# Patient Record
Sex: Female | Born: 1940 | Race: White | Hispanic: No | Marital: Married | Smoking: Former smoker
Health system: Southern US, Community
[De-identification: ages and names within clinical notes are randomized; demographics above are authoritative.]

## PROBLEM LIST (undated history)

## (undated) DIAGNOSIS — C801 Malignant (primary) neoplasm, unspecified: Secondary | ICD-10-CM

## (undated) DIAGNOSIS — E785 Hyperlipidemia, unspecified: Secondary | ICD-10-CM

## (undated) DIAGNOSIS — I10 Essential (primary) hypertension: Secondary | ICD-10-CM

## (undated) HISTORY — PX: TONSILLECTOMY: SUR1361

## (undated) HISTORY — PX: ABDOMINAL HYSTERECTOMY: SHX81

## (undated) HISTORY — PX: HEMORROIDECTOMY: SUR656

## (undated) HISTORY — PX: COLONOSCOPY: SHX174

---

## 2000-02-23 ENCOUNTER — Other Ambulatory Visit: Admission: RE | Admit: 2000-02-23 | Discharge: 2000-02-23 | Payer: Self-pay | Admitting: Obstetrics and Gynecology

## 2000-02-27 HISTORY — PX: COLONOSCOPY W/ POLYPECTOMY: SHX1380

## 2000-12-27 ENCOUNTER — Other Ambulatory Visit: Admission: RE | Admit: 2000-12-27 | Discharge: 2000-12-27 | Payer: Self-pay | Admitting: Obstetrics and Gynecology

## 2004-04-16 ENCOUNTER — Other Ambulatory Visit: Payer: Self-pay

## 2004-04-17 ENCOUNTER — Observation Stay: Payer: Self-pay | Admitting: Internal Medicine

## 2005-10-20 ENCOUNTER — Ambulatory Visit: Payer: Self-pay | Admitting: Unknown Physician Specialty

## 2005-12-22 ENCOUNTER — Ambulatory Visit: Payer: Self-pay | Admitting: Unknown Physician Specialty

## 2011-11-23 ENCOUNTER — Ambulatory Visit: Payer: Self-pay | Admitting: Unknown Physician Specialty

## 2011-12-26 ENCOUNTER — Ambulatory Visit: Payer: Self-pay | Admitting: Anesthesiology

## 2012-01-03 ENCOUNTER — Ambulatory Visit: Payer: Self-pay | Admitting: Unknown Physician Specialty

## 2012-03-13 ENCOUNTER — Ambulatory Visit: Payer: Self-pay | Admitting: Family Medicine

## 2013-03-06 ENCOUNTER — Ambulatory Visit: Payer: Self-pay | Admitting: Family Medicine

## 2013-04-18 ENCOUNTER — Emergency Department: Payer: Self-pay | Admitting: Internal Medicine

## 2013-07-01 ENCOUNTER — Ambulatory Visit: Payer: Self-pay | Admitting: Hematology and Oncology

## 2013-10-10 ENCOUNTER — Ambulatory Visit: Payer: Self-pay | Admitting: Hematology and Oncology

## 2013-10-27 ENCOUNTER — Ambulatory Visit: Payer: Self-pay | Admitting: Hematology and Oncology

## 2014-03-17 ENCOUNTER — Ambulatory Visit: Payer: Self-pay | Admitting: Family Medicine

## 2014-09-15 NOTE — Op Note (Signed)
PATIENT NAME:  Shirley Nichols, STREET MR#:  [id number] DATE OF BIRTH:  1941/02/22  DATE OF PROCEDURE:  01/03/2012  PREOPERATIVE DIAGNOSIS: Torn medial meniscus, right knee.   POSTOPERATIVE DIAGNOSES:  1. Torn medial meniscus, right knee. 2. Medial femoral chondral lesion.   PROCEDURE: Arthroscopic partial medial meniscectomy plus debridement of the medial femoral chondral lesions.   SURGEON: Kathrene Alu., MD  ANESTHESIA: General.   HISTORY: The patient had a long history of right knee pain. It was initially precipitated by an injury. Plain films had revealed minimal narrowing of her medial compartment on the right. MRI was consistent with a torn medial meniscus. Patient was ultimately brought in for arthroscopy of her right knee.   DESCRIPTION OF PROCEDURE: Patient taken to the Operating Room where satisfactory general anesthesia was achieved. A tourniquet and legholder were applied to the patient's right lower thigh and the left lower extremity was supported with a well legholder. The right knee was prepped and draped in the usual fashion for an arthroscopic procedure. Inflow cannula was introduced superomedially. The joint was distended with lactated Ringer's. We used the Mitek fluid pump to facilitate joint distention.   The scope was introduced through an inferolateral puncture wound and a probe through an inferomedial puncture wound. Inspection of the medial meniscus revealed that there was a large radial tear in the posterior horn of the medial meniscus near its root attachment. I went ahead and resected the tear using a combination of basket biters and a motorized resector. The rim was contoured with an angled ArthroCare thermal wand. Patient had grade 2, almost grade 3, changes in the weight-bearing portion of the medial femoral condyle. This area was debrided with a Turbowhisker.   Inspection of the intercondylar notch revealed that the cruciates were intact. Inspection of the  lateral compartment failed to reveal any significant pathology. There was a little fraying on the inner edge of her lateral meniscus which I debrided with a Turbowhisker.   The scope was introduced through the intercondylar notch into the posterior recess of the medial and lateral compartments. No additional pathology was noted. The trochlear groove was smooth. The retropatellar surface was visualized primarily from the superomedial portal. The patient had a little fraying of the superior portion of the patella. This was debrided with a Turbowhisker. Patellar tracking seemed to be normal.   The joint was decompressed at this time through the cannulas. The cannulas were removed. Puncture wounds were closed with 3-0 nylon in vertical mattress fashion. Several mL of 0.5% Marcaine with epinephrine were injected about each puncture wound. Betadine was applied to the wounds followed by a sterile dressing. An ice pack was applied to his right knee.   She was then awakened and transferred to a stretcher bed. She was taken to recovery room in satisfactory condition. Blood loss was negligible. Tourniquet, incidentally, was not inflated during the course of the procedure.   ____________________________ Kathrene Alu., MD hbk:cms D: 01/04/2012 09:17:32 ET T: 01/04/2012 09:57:53 ET JOB#: [id number]  cc: Kathrene Alu., MD, <Dictator>  Vilinda Flake, JR MD ELECTRONICALLY SIGNED 01/14/2012 14:50

## 2015-03-25 ENCOUNTER — Other Ambulatory Visit: Payer: Self-pay | Admitting: Family Medicine

## 2015-03-25 DIAGNOSIS — Z1231 Encounter for screening mammogram for malignant neoplasm of breast: Secondary | ICD-10-CM

## 2015-03-30 ENCOUNTER — Ambulatory Visit
Admission: RE | Admit: 2015-03-30 | Discharge: 2015-03-30 | Disposition: A | Discharge status: Home / Self Care | Payer: BLUE CROSS/BLUE SHIELD | Source: Ambulatory Visit | Attending: Family Medicine | Admitting: Family Medicine

## 2015-03-30 DIAGNOSIS — Z1231 Encounter for screening mammogram for malignant neoplasm of breast: Secondary | ICD-10-CM | POA: Diagnosis not present

## 2015-03-30 HISTORY — DX: Malignant (primary) neoplasm, unspecified: C80.1

## 2016-03-21 ENCOUNTER — Other Ambulatory Visit: Payer: Self-pay | Admitting: Family Medicine

## 2016-03-21 DIAGNOSIS — Z1231 Encounter for screening mammogram for malignant neoplasm of breast: Secondary | ICD-10-CM

## 2016-03-22 ENCOUNTER — Ambulatory Visit (INDEPENDENT_AMBULATORY_CARE_PROVIDER_SITE_OTHER): Payer: BLUE CROSS/BLUE SHIELD | Admitting: Podiatry

## 2016-03-22 ENCOUNTER — Encounter: Payer: Self-pay | Admitting: Podiatry

## 2016-03-22 ENCOUNTER — Other Ambulatory Visit: Payer: Self-pay | Admitting: *Deleted

## 2016-03-22 DIAGNOSIS — Q828 Other specified congenital malformations of skin: Secondary | ICD-10-CM | POA: Diagnosis not present

## 2016-03-22 NOTE — Progress Notes (Signed)
   Subjective:    Patient ID: Shirley Nichols, female    DOB: 29-Jun-1940, 75 y.o.   MRN: [id number]  HPI: She presents today with a chief complaint of pain to the plantar aspect of the right foot. States his been present for many years she takes that it and she just states is getting somewhat worse that she can hardly walk on it.    Review of Systems  All other systems reviewed and are negative.      Objective:   Physical Exam: Vital signs are stable she is alert and oriented 3. Pulses are palpable. Neurologic sensorium is intact. Deep tendon reflexes are intact and muscle strength is symmetrical and equal bilateral. Orthopedic evaluation strips all joints distal to the ankle range of motion without crepitus mild HAV deformity hammertoe deformities are noted bilateral. Cutaneous evaluation demonstrates reactive hyperkeratotic lesion plantar aspect sub-fourth metatarsal head of the right foot. This is not wart. This appears to be a porokeratosis..     Assessment & Plan:  Porokeratosis plantar aspect right foot.  Plan: Debridement and reactive hyperkeratotic tissue manual manually and debrided with chemical. To be left on under under occlusion until tomorrow morning that she will wash it off thoroughly. Follow up with her in a few weeks just to make sure that she is healing well.

## 2016-04-06 ENCOUNTER — Ambulatory Visit
Admission: RE | Admit: 2016-04-06 | Discharge: 2016-04-06 | Disposition: A | Discharge status: Home / Self Care | Payer: BLUE CROSS/BLUE SHIELD | Source: Ambulatory Visit | Attending: Family Medicine | Admitting: Family Medicine

## 2016-04-06 DIAGNOSIS — Z1231 Encounter for screening mammogram for malignant neoplasm of breast: Secondary | ICD-10-CM | POA: Diagnosis present

## 2016-04-17 ENCOUNTER — Ambulatory Visit (INDEPENDENT_AMBULATORY_CARE_PROVIDER_SITE_OTHER): Payer: BLUE CROSS/BLUE SHIELD | Admitting: Podiatry

## 2016-04-17 ENCOUNTER — Encounter: Payer: Self-pay | Admitting: Podiatry

## 2016-04-17 DIAGNOSIS — Q828 Other specified congenital malformations of skin: Secondary | ICD-10-CM

## 2016-04-17 NOTE — Progress Notes (Signed)
She presents today for follow-up of her porokeratosis to the plantar aspect of the fourth metatarsal of the right foot. She states that really never did blister but he feels better than it did prior to her last visit.  Objective: Vital signs are stable she is alert and oriented 3 pulses are palpable. She has a reactive hyperkeratotic with a nucleated core beneath the fourth metatarsal phalangeal joint. There is no signs of infection noted. Once no malodor no erythema. Positive pain on palpation.  Assessment: Porokeratosis right foot.  Plan: Mechanical and chemical debridement today with Cantharone and I will follow-up with her in 4-6 weeks. She understands that she will need wash this off thoroughly tomorrow morning and that this may blister. If he does blister she may last blister and allow for drainage and then utilize a Band-Aid. She is not to do Ruth blister.

## 2016-04-26 ENCOUNTER — Ambulatory Visit: Payer: BLUE CROSS/BLUE SHIELD | Admitting: Podiatry

## 2016-06-12 ENCOUNTER — Ambulatory Visit: Payer: BLUE CROSS/BLUE SHIELD | Admitting: Podiatry

## 2016-11-02 ENCOUNTER — Encounter: Payer: Self-pay | Admitting: *Deleted

## 2016-11-03 ENCOUNTER — Ambulatory Visit: Payer: BLUE CROSS/BLUE SHIELD | Admitting: Certified Registered"

## 2016-11-03 ENCOUNTER — Ambulatory Visit
Admission: RE | Admit: 2016-11-03 | Discharge: 2016-11-03 | Disposition: A | Discharge status: Home / Self Care | Payer: BLUE CROSS/BLUE SHIELD | Source: Ambulatory Visit | Attending: Unknown Physician Specialty | Admitting: Unknown Physician Specialty

## 2016-11-03 ENCOUNTER — Encounter
Admission: RE | Disposition: A | Discharge status: Home / Self Care | Payer: Self-pay | Source: Ambulatory Visit | Attending: Unknown Physician Specialty

## 2016-11-03 ENCOUNTER — Encounter: Payer: Self-pay | Admitting: *Deleted

## 2016-11-03 DIAGNOSIS — J449 Chronic obstructive pulmonary disease, unspecified: Secondary | ICD-10-CM | POA: Diagnosis not present

## 2016-11-03 DIAGNOSIS — Z79899 Other long term (current) drug therapy: Secondary | ICD-10-CM | POA: Insufficient documentation

## 2016-11-03 DIAGNOSIS — Z86010 Personal history of colonic polyps: Secondary | ICD-10-CM | POA: Diagnosis not present

## 2016-11-03 DIAGNOSIS — Z87891 Personal history of nicotine dependence: Secondary | ICD-10-CM | POA: Insufficient documentation

## 2016-11-03 DIAGNOSIS — E785 Hyperlipidemia, unspecified: Secondary | ICD-10-CM | POA: Insufficient documentation

## 2016-11-03 DIAGNOSIS — K640 First degree hemorrhoids: Secondary | ICD-10-CM | POA: Insufficient documentation

## 2016-11-03 DIAGNOSIS — Z1211 Encounter for screening for malignant neoplasm of colon: Secondary | ICD-10-CM | POA: Insufficient documentation

## 2016-11-03 DIAGNOSIS — Z85828 Personal history of other malignant neoplasm of skin: Secondary | ICD-10-CM | POA: Insufficient documentation

## 2016-11-03 DIAGNOSIS — I10 Essential (primary) hypertension: Secondary | ICD-10-CM | POA: Diagnosis not present

## 2016-11-03 DIAGNOSIS — Z7982 Long term (current) use of aspirin: Secondary | ICD-10-CM | POA: Insufficient documentation

## 2016-11-03 DIAGNOSIS — Z7951 Long term (current) use of inhaled steroids: Secondary | ICD-10-CM | POA: Insufficient documentation

## 2016-11-03 HISTORY — DX: Essential (primary) hypertension: I10

## 2016-11-03 HISTORY — DX: Hyperlipidemia, unspecified: E78.5

## 2016-11-03 HISTORY — PX: COLONOSCOPY WITH PROPOFOL: SHX5780

## 2016-11-03 SURGERY — COLONOSCOPY WITH PROPOFOL
Anesthesia: General

## 2016-11-03 MED ORDER — SODIUM CHLORIDE 0.9 % IV SOLN
INTRAVENOUS | Status: DC
  Administered 2016-11-03: 09:00:00 via INTRAVENOUS

## 2016-11-03 MED ORDER — PROPOFOL 500 MG/50ML IV EMUL
INTRAVENOUS | Status: AC
  Filled 2016-11-03: qty 50

## 2016-11-03 MED ORDER — LIDOCAINE 2% (20 MG/ML) 5 ML SYRINGE
INTRAMUSCULAR | Status: DC | PRN
  Administered 2016-11-03: 25 mg via INTRAVENOUS

## 2016-11-03 MED ORDER — SODIUM CHLORIDE 0.9 % IV SOLN: INTRAVENOUS | Status: DC

## 2016-11-03 MED ORDER — PROPOFOL 500 MG/50ML IV EMUL
INTRAVENOUS | Status: DC | PRN
  Administered 2016-11-03: 120 ug/kg/min via INTRAVENOUS

## 2016-11-03 MED ORDER — PROPOFOL 10 MG/ML IV BOLUS
INTRAVENOUS | Status: DC | PRN
  Administered 2016-11-03: 50 mg via INTRAVENOUS
  Administered 2016-11-03 (×2): 20 mg via INTRAVENOUS

## 2016-11-03 NOTE — Anesthesia Post-op Follow-up Note (Cosign Needed)
Anesthesia QCDR form completed.        

## 2016-11-03 NOTE — Transfer of Care (Signed)
Immediate Anesthesia Transfer of Care Note  Patient: Shirley Nichols  Procedure(s) Performed: Procedure(s): COLONOSCOPY WITH PROPOFOL (N/A)  Patient Location: Endoscopy Unit  Anesthesia Type:General  Level of Consciousness: awake  Airway & Oxygen Therapy: Patient Spontanous Breathing and Patient connected to nasal cannula oxygen  Post-op Assessment: Report given to RN and Post -op Vital signs reviewed and stable  Post vital signs: Reviewed  Last Vitals:  Vitals:   11/03/16 0838 11/03/16 1009  BP: 120/77 112/63  Pulse: 77 69  Resp: 14 15  Temp: 36.1 C     Last Pain:  Vitals:   11/03/16 0838  TempSrc: Tympanic         Complications: No apparent anesthesia complications

## 2016-11-03 NOTE — Op Note (Signed)
[facility] Patient Name: Shirley Nichols Procedure Date: 11/03/2016 9:38 AM MRN: [id number] Account #: [id number] Date of Birth: March 25, 1941 Admit Type: Outpatient Age: 76 Room: [facility] ENDO ROOM 1 Gender: Female Note Status: Finalized Procedure:            Colonoscopy Indications:          High risk colon cancer surveillance: Personal history                        of colonic polyps Providers:            Manya Silvas, MD Referring MD:         Jordan Likes. Lavena Bullion (Referring MD) Medicines:            Propofol per Anesthesia Complications:        No immediate complications. Procedure:            Pre-Anesthesia Assessment:                       - After reviewing the risks and benefits, the patient                        was deemed in satisfactory condition to undergo the                        procedure.                       After obtaining informed consent, the colonoscope was                        passed under direct vision. Throughout the procedure,                        the patient's blood pressure, pulse, and oxygen                        saturations were monitored continuously. The                        Colonoscope was introduced through the anus and                        advanced to the the cecum, identified by appendiceal                        orifice and ileocecal valve. The colonoscopy was                        somewhat difficult due to significant looping and a                        tortuous colon. Successful completion of the procedure                        was aided by applying abdominal pressure. The patient                        tolerated the procedure well. The quality of the bowel  preparation was excellent. Findings:      Internal hemorrhoids were found during endoscopy. The hemorrhoids were       small and Grade I (internal hemorrhoids that do not prolapse). Small       anal papillie      The exam  was otherwise without abnormality. Impression:           - Internal hemorrhoids.                       - The examination was otherwise normal.                       - No specimens collected. Recommendation:       - The findings and recommendations were discussed with                        the patient's family. No need to repeat exam due to age                        and 2 normal colonoscopies in 5 years. Manya Silvas, MD 11/03/2016 10:11:44 AM This report has been signed electronically. Number of Addenda: 0 Note Initiated On: 11/03/2016 9:38 AM Scope Withdrawal Time: 0 hours 8 minutes 30 seconds  Total Procedure Duration: 0 hours 24 minutes 42 seconds       [facility]

## 2016-11-03 NOTE — Anesthesia Postprocedure Evaluation (Signed)
Anesthesia Post Note  Patient: [name]  Procedure(s) Performed: Procedure(s) (LRB): COLONOSCOPY WITH PROPOFOL (N/A)  Patient location during evaluation: PACU Anesthesia Type: General Level of consciousness: awake Pain management: satisfactory to patient Vital Signs Assessment: post-procedure vital signs reviewed and stable Respiratory status: nonlabored ventilation Cardiovascular status: stable Anesthetic complications: no     Last Vitals:  Vitals:   11/03/16 1009 11/03/16 1010  BP: 112/63 112/63  Pulse: 69 69  Resp: 15 14  Temp:  (!) 35.6 C    Last Pain:  Vitals:   11/03/16 1010  TempSrc: Tympanic                 VAN STAVEREN,Marlaine Arey

## 2016-11-03 NOTE — Anesthesia Preprocedure Evaluation (Signed)
Anesthesia Evaluation  Patient identified by MRN, date of birth, ID band Patient awake    Reviewed: Allergy & Precautions, NPO status , Patient's Chart, lab work & pertinent test results  Airway Mallampati: II       Dental  (+) Teeth Intact   Pulmonary COPD, former smoker,     + decreased breath sounds      Cardiovascular Exercise Tolerance: Good hypertension, Pt. on medications  Rhythm:Regular     Neuro/Psych negative neurological ROS     GI/Hepatic negative GI ROS, Neg liver ROS,   Endo/Other  negative endocrine ROS  Renal/GU negative Renal ROS     Musculoskeletal   Abdominal Normal abdominal exam  (+)   Peds negative pediatric ROS (+)  Hematology   Anesthesia Other Findings   Reproductive/Obstetrics                             Anesthesia Physical Anesthesia Plan  ASA: II  Anesthesia Plan: General   Post-op Pain Management:    Induction: Intravenous  PONV Risk Score and Plan: 1 and Ondansetron and Treatment may vary due to age  Airway Management Planned: Natural Airway  Additional Equipment:   Intra-op Plan:   Post-operative Plan:   Informed Consent: I have reviewed the patients History and Physical, chart, labs and discussed the procedure including the risks, benefits and alternatives for the proposed anesthesia with the patient or authorized representative who has indicated his/her understanding and acceptance.     Plan Discussed with: CRNA  Anesthesia Plan Comments:         Anesthesia Quick Evaluation

## 2016-11-03 NOTE — H&P (Signed)
   Primary Care Physician:  Remi Haggard, FNP Primary Gastroenterologist:  Dr. Vira Agar  Pre-Procedure History & Physical: HPI:  Shirley Nichols is a 76 y.o. female is here for an colonoscopy.   Past Medical History:  Diagnosis Date  . Cancer ([place])    skin cancer  . Hyperlipidemia   . Hypertension     Past Surgical History:  Procedure Laterality Date  . ABDOMINAL HYSTERECTOMY    . COLONOSCOPY     02/21/2002 adenomatous polyp, 03/23/2011  . HEMORROIDECTOMY    . TONSILLECTOMY      Prior to Admission medications   Medication Sig Start Date End Date Taking? Authorizing Provider  aspirin EC 81 MG tablet Take 81 mg by mouth daily.   Yes [provider]  b complex vitamins capsule Take 1 capsule by mouth daily.   Yes [provider]  Calcium Carbonate-Vit D-Min (CALCIUM 1200 PO) Take by mouth.   Yes [provider]  Cholecalciferol (VITAMIN D3 PO) Take by mouth.   Yes [provider]  Cyanocobalamin (B-12 PO) Take by mouth.   Yes [provider]  fluvastatin XL (LESCOL XL) 80 MG 24 hr tablet  02/19/16  Yes [provider]  hydrocortisone (PROCTO-MED HC) 2.5 % rectal cream Place 1 application rectally 2 (two) times daily.   Yes [provider]  valsartan-hydrochlorothiazide (DIOVAN-HCT) 320-25 MG tablet  01/28/16  Yes [provider]  SYMBICORT 160-4.5 MCG/ACT inhaler  02/05/16   [provider]    Allergies as of 08/10/2016  . (No Known Allergies)    Family History  Problem Relation Age of Onset  . Breast cancer Neg Hx     Social History   Social History  . Marital status: Married    Spouse name: N/A  . Number of children: N/A  . Years of education: N/A   Occupational History  . Not on file.   Social History Main Topics  . Smoking status: Former Research scientist (life sciences)  . Smokeless tobacco: Never Used  . Alcohol use No  . Drug use: No  . Sexual activity: Yes   Other Topics Concern  . Not on file    Social History Narrative  . No narrative on file    Review of Systems: See HPI, otherwise negative ROS  Physical Exam: BP 120/77   Pulse 77   Temp 97 F (36.1 C) (Tympanic)   Resp 14   Ht 5\' 4"  (1.626 m)   Wt 65.8 kg (145 lb)   SpO2 100%   BMI 24.89 kg/m  General:   Alert,  pleasant and cooperative in NAD Head:  Normocephalic and atraumatic. Neck:  Supple; no masses or thyromegaly. Lungs:  Clear throughout to auscultation.    Heart:  Regular rate and rhythm. Abdomen:  Soft, nontender and nondistended. Normal bowel sounds, without guarding, and without rebound.   Neurologic:  Alert and  oriented x4;  grossly normal neurologically.  Impression/Plan: Shirley Nichols is here for an colonoscopy to be performed for Personal history of colon polyps.  Risks, benefits, limitations, and alternatives regarding  colonoscopy have been reviewed with the patient.  Questions have been answered.  All parties agreeable.   Gaylyn Cheers, MD  11/03/2016, 9:33 AM

## 2016-11-06 ENCOUNTER — Encounter: Payer: Self-pay | Admitting: Unknown Physician Specialty

## 2016-11-22 ENCOUNTER — Ambulatory Visit
Admission: RE | Admit: 2016-11-22 | Discharge: 2016-11-22 | Disposition: A | Discharge status: Home / Self Care | Payer: BLUE CROSS/BLUE SHIELD | Source: Ambulatory Visit | Attending: Unknown Physician Specialty | Admitting: Unknown Physician Specialty

## 2016-11-22 ENCOUNTER — Other Ambulatory Visit: Payer: Self-pay | Admitting: Unknown Physician Specialty

## 2016-11-22 DIAGNOSIS — R51 Headache: Principal | ICD-10-CM

## 2016-11-22 DIAGNOSIS — R519 Headache, unspecified: Secondary | ICD-10-CM

## 2016-11-24 ENCOUNTER — Ambulatory Visit (INDEPENDENT_AMBULATORY_CARE_PROVIDER_SITE_OTHER): Payer: BLUE CROSS/BLUE SHIELD | Admitting: Podiatry

## 2016-11-24 ENCOUNTER — Encounter: Payer: Self-pay | Admitting: Podiatry

## 2016-11-24 DIAGNOSIS — Q828 Other specified congenital malformations of skin: Secondary | ICD-10-CM | POA: Diagnosis not present

## 2016-11-24 NOTE — Progress Notes (Signed)
   Subjective: Patient presents to the office today for chief complaint of painful callus lesions of the feet. Patient states that the pain is ongoing and is affecting their ability to ambulate without pain. Patient presents today for further treatment and evaluation.  Objective:  Physical Exam General: Alert and oriented x3 in no acute distress  Dermatology: Hyperkeratotic lesion present on the plantar forefoot bilateral feet 4. Pain on palpation with a central nucleated core noted.  Skin is warm, dry and supple bilateral lower extremities. Negative for open lesions or macerations.  Vascular: Palpable pedal pulses bilaterally. No edema or erythema noted. Capillary refill within normal limits.  Neurological: Epicritic and protective threshold grossly intact bilaterally.   Musculoskeletal Exam: Pain on palpation at the keratotic lesion noted. Range of motion within normal limits bilateral. Muscle strength 5/5 in all groups bilateral.  Assessment: #1 porokeratosis 4 bilateral feet   Plan of Care:  #1 Patient evaluated #2 Excisional debridement of keratoic lesion using a chisel blade was performed without incident.  #3 Treated area(s) with Salinocaine and dressed with light dressing. #4 Patient is to return to the clinic PRN.   Edrick Kins, DPM [facility]  Dr. Edrick Kins, [address]                                        [place] [place], [place] [address]                Office [phone]  Fax [phone]

## 2017-03-29 ENCOUNTER — Other Ambulatory Visit: Payer: Self-pay | Admitting: Family Medicine

## 2017-03-29 DIAGNOSIS — Z1231 Encounter for screening mammogram for malignant neoplasm of breast: Secondary | ICD-10-CM

## 2017-04-11 ENCOUNTER — Ambulatory Visit
Admission: RE | Admit: 2017-04-11 | Discharge: 2017-04-11 | Disposition: A | Discharge status: Home / Self Care | Payer: BLUE CROSS/BLUE SHIELD | Source: Ambulatory Visit | Attending: Family Medicine | Admitting: Family Medicine

## 2017-04-11 DIAGNOSIS — Z1231 Encounter for screening mammogram for malignant neoplasm of breast: Secondary | ICD-10-CM | POA: Diagnosis present

## 2017-05-07 ENCOUNTER — Ambulatory Visit: Payer: BLUE CROSS/BLUE SHIELD | Admitting: Podiatry

## 2017-05-14 ENCOUNTER — Encounter: Payer: Self-pay | Admitting: Podiatry

## 2017-05-14 ENCOUNTER — Ambulatory Visit (INDEPENDENT_AMBULATORY_CARE_PROVIDER_SITE_OTHER): Payer: BLUE CROSS/BLUE SHIELD | Admitting: Podiatry

## 2017-05-14 DIAGNOSIS — Q828 Other specified congenital malformations of skin: Secondary | ICD-10-CM | POA: Diagnosis not present

## 2017-05-14 NOTE — Progress Notes (Signed)
She presents today chief complaint of painful lesion to the plantar aspect of her right foot.  Objective: Vital signs are stable she is alert and oriented x3.  Pulses are palpable.  She presents today with a reactive hyperkeratotic lesion beneath the base of the fourth digit of the right foot.  This is exquisitely tender on direct palpation and medial lateral compression.  There is no signs of purulence malodor or any signs of infection.  Assessment: Porokeratosis.  Plan: Debridement of all reactive hyperkeratosis.  I placed salicylic acid under occlusion to be left there for 3 days.  She was given both oral and written home-going instruction for the care use of this.  Follow-up with me in 1 month or as needed.

## 2017-10-17 ENCOUNTER — Ambulatory Visit (INDEPENDENT_AMBULATORY_CARE_PROVIDER_SITE_OTHER): Payer: BLUE CROSS/BLUE SHIELD | Admitting: Podiatry

## 2017-10-17 ENCOUNTER — Encounter: Payer: Self-pay | Admitting: Podiatry

## 2017-10-17 DIAGNOSIS — Q828 Other specified congenital malformations of skin: Secondary | ICD-10-CM

## 2017-10-17 NOTE — Progress Notes (Signed)
She presents today for follow-up of painful poor keratoma to the plantar aspect of the right foot.  She states that think any of these removed once again.  Objective: Vital signs are stable alert and oriented x3.  Pulses are palpable.  Neurologic sensorium is intact.  Porokeratosis present to the plantar aspect beneath the third and fourth digits.  No open lesions or wounds.  Assessment: Porokeratosis.  Plan: Debridement of all reactive hyperkeratoses deeply today.  Salicylic acid was applied under occlusion to be left on for 3 days then washed off thoroughly I will follow-up with her in several weeks.

## 2018-01-02 DIAGNOSIS — M79644 Pain in right finger(s): Secondary | ICD-10-CM | POA: Insufficient documentation

## 2018-01-02 DIAGNOSIS — M1811 Unilateral primary osteoarthritis of first carpometacarpal joint, right hand: Secondary | ICD-10-CM | POA: Insufficient documentation

## 2018-04-10 ENCOUNTER — Other Ambulatory Visit: Payer: Self-pay | Admitting: Family Medicine

## 2018-04-10 DIAGNOSIS — Z1231 Encounter for screening mammogram for malignant neoplasm of breast: Secondary | ICD-10-CM

## 2018-04-18 ENCOUNTER — Ambulatory Visit
Admission: RE | Admit: 2018-04-18 | Discharge: 2018-04-18 | Disposition: A | Discharge status: Home / Self Care | Payer: BLUE CROSS/BLUE SHIELD | Source: Ambulatory Visit | Attending: Family Medicine | Admitting: Family Medicine

## 2018-04-18 DIAGNOSIS — Z1231 Encounter for screening mammogram for malignant neoplasm of breast: Secondary | ICD-10-CM | POA: Insufficient documentation

## 2019-03-13 ENCOUNTER — Other Ambulatory Visit: Payer: Self-pay | Admitting: Family Medicine

## 2019-03-13 DIAGNOSIS — Z1231 Encounter for screening mammogram for malignant neoplasm of breast: Secondary | ICD-10-CM

## 2019-04-23 ENCOUNTER — Ambulatory Visit
Admission: RE | Admit: 2019-04-23 | Discharge: 2019-04-23 | Disposition: A | Discharge status: Home / Self Care | Payer: Medicare Other | Source: Ambulatory Visit | Attending: Family Medicine | Admitting: Family Medicine

## 2019-04-23 DIAGNOSIS — Z1231 Encounter for screening mammogram for malignant neoplasm of breast: Secondary | ICD-10-CM | POA: Diagnosis present

## 2020-02-19 ENCOUNTER — Other Ambulatory Visit: Payer: Self-pay | Admitting: Family Medicine

## 2020-02-19 DIAGNOSIS — Z1231 Encounter for screening mammogram for malignant neoplasm of breast: Secondary | ICD-10-CM

## 2020-04-14 ENCOUNTER — Ambulatory Visit: Payer: BLUE CROSS/BLUE SHIELD | Admitting: Podiatry

## 2020-04-14 ENCOUNTER — Encounter: Payer: Self-pay | Admitting: Podiatry

## 2020-04-14 ENCOUNTER — Ambulatory Visit (INDEPENDENT_AMBULATORY_CARE_PROVIDER_SITE_OTHER): Payer: Medicare Other | Admitting: Podiatry

## 2020-04-14 ENCOUNTER — Other Ambulatory Visit: Payer: Self-pay

## 2020-04-14 DIAGNOSIS — L989 Disorder of the skin and subcutaneous tissue, unspecified: Secondary | ICD-10-CM

## 2020-04-14 DIAGNOSIS — Q828 Other specified congenital malformations of skin: Secondary | ICD-10-CM | POA: Diagnosis not present

## 2020-04-14 NOTE — Patient Instructions (Signed)
Look for urea 40% cream or ointment and apply to the thickened dry skin / calluses. This can be bought over the counter, at a pharmacy or online such as [employer].  

## 2020-04-14 NOTE — Progress Notes (Signed)
°  Subjective:  Patient ID: Shirley Nichols, female    DOB: 1940/09/24,  MRN: [id number]  Chief Complaint  Patient presents with   Callouses    Painful callouses bottom of right forefoot x 1-2 weeks    79 y.o. female presents with the above complaint. History confirmed with patient.  Previously treated by Dr. Milinda Pointer with debridement and application of salicylic acid, this was helpful  Objective:  Physical Exam: warm, good capillary refill, no trophic changes or ulcerative lesions, normal DP and PT pulses and normal sensory exam.  Right Foot: Porokeratosis submet 3-4, hammertoes present   Assessment:   1. Porokeratosis   2. Benign skin lesion      Plan:  Patient was evaluated and treated and all questions answered.  Discussed with her the etiology and treatment options for porokeratosis.  I recommended debridement and application of salicylic acid.  A chisel blade was used to debride the lesion and enucleate the core, salicylic acid salinocaine ointment was applied with an occlusive bandage and a pressure dressing.  She tolerated procedure well  Return if symptoms worsen or fail to improve, for calluses.

## 2020-04-26 ENCOUNTER — Ambulatory Visit
Admission: RE | Admit: 2020-04-26 | Discharge: 2020-04-26 | Disposition: A | Discharge status: Home / Self Care | Payer: Medicare Other | Source: Ambulatory Visit | Attending: Family Medicine | Admitting: Family Medicine

## 2020-04-26 ENCOUNTER — Other Ambulatory Visit: Payer: Self-pay

## 2020-04-26 DIAGNOSIS — Z1231 Encounter for screening mammogram for malignant neoplasm of breast: Secondary | ICD-10-CM | POA: Diagnosis present

## 2020-09-06 ENCOUNTER — Ambulatory Visit (INDEPENDENT_AMBULATORY_CARE_PROVIDER_SITE_OTHER): Payer: Medicare Other | Admitting: Podiatry

## 2020-09-06 ENCOUNTER — Other Ambulatory Visit: Payer: Self-pay

## 2020-09-06 ENCOUNTER — Encounter: Payer: Self-pay | Admitting: Podiatry

## 2020-09-06 DIAGNOSIS — D2371 Other benign neoplasm of skin of right lower limb, including hip: Secondary | ICD-10-CM | POA: Diagnosis not present

## 2020-09-06 NOTE — Progress Notes (Signed)
She presents today chief chief complaint of painful callus plantar aspect of the right foot.  Objective: Calluses are thick to the right foot subthird and subfourth metatarsal heads of the right foot.  They appear to be very deep lesions.  They are painful on palpation as well as debridement.  Assessment: Benign skin lesions.  Plan: Chemical and chemical debridement of benign skin lesion with salicylic acid under occlusion to be left on for 3 days before washing off thoroughly I will follow-up with her on an as-needed basis.

## 2021-01-10 ENCOUNTER — Ambulatory Visit: Payer: Medicare Other | Admitting: Podiatry

## 2021-01-26 ENCOUNTER — Encounter: Payer: Self-pay | Admitting: Podiatry

## 2021-01-26 ENCOUNTER — Ambulatory Visit (INDEPENDENT_AMBULATORY_CARE_PROVIDER_SITE_OTHER): Payer: Medicare Other | Admitting: Podiatry

## 2021-01-26 ENCOUNTER — Other Ambulatory Visit: Payer: Self-pay

## 2021-01-26 DIAGNOSIS — D2371 Other benign neoplasm of skin of right lower limb, including hip: Secondary | ICD-10-CM

## 2021-01-26 NOTE — Progress Notes (Signed)
She presents today after having not seen her since April with a chief complaint of painful lesion plantar aspect of the right foot.  Objective: Vital signs are stable alert and oriented x3.  Pulses are palpable she has a painful lesion plantar aspect submetatarsal fourth right.  Assessment: Painful benign skin lesion.  Plan: Mechanical and chemical debridement of painful benign skin lesion plantar aspect right foot.  This will be left on under occlusion for 3 days and then washed off thoroughly.

## 2021-02-21 ENCOUNTER — Other Ambulatory Visit: Payer: Self-pay | Admitting: Family Medicine

## 2021-02-21 DIAGNOSIS — Z1231 Encounter for screening mammogram for malignant neoplasm of breast: Secondary | ICD-10-CM

## 2021-04-19 ENCOUNTER — Other Ambulatory Visit: Payer: Self-pay

## 2021-04-19 ENCOUNTER — Ambulatory Visit (INDEPENDENT_AMBULATORY_CARE_PROVIDER_SITE_OTHER): Payer: Medicare Other | Admitting: Podiatry

## 2021-04-19 ENCOUNTER — Encounter: Payer: Self-pay | Admitting: Podiatry

## 2021-04-19 DIAGNOSIS — M10072 Idiopathic gout, left ankle and foot: Secondary | ICD-10-CM | POA: Diagnosis not present

## 2021-04-19 DIAGNOSIS — M7752 Other enthesopathy of left foot: Secondary | ICD-10-CM

## 2021-04-19 MED ORDER — COLCHICINE 0.6 MG PO TABS: 0.6000 mg | ORAL_TABLET | Freq: Every day | ORAL | 0 refills | Status: DC

## 2021-04-19 MED ORDER — METHYLPREDNISOLONE 4 MG PO TBPK: ORAL_TABLET | ORAL | 0 refills | Status: DC

## 2021-04-19 NOTE — Progress Notes (Signed)
Subjective:  Patient ID: Shirley Nichols, female    DOB: [birth year],  MRN: [id number]  Chief Complaint  Patient presents with   Toe Pain    Left hallux toe pain  PT stated she went to urgent care and they gave her meds and still not getting any better     80 y.o. female presents with the above complaint.  Patient presents with left first metatarsophalangeal joint pain.  Patient states is painful to touch.  She states that it is painful with ambulation and came out of nowhere.  She does not have any history of gout.  Is red hot swollen joint.  Is been on for past 5 days.  She has a intake and fish.  She does not have a high intake of red meat or alcohol.  She has not seen MRIs prior to seeing me.  She went to go see her primary care physician who put her on antibiotics and gave her mupirocin ointment.  She does not have any ingrown pain.   Review of Systems: Negative except as noted in the HPI. Denies N/V/F/Ch.  Past Medical History:  Diagnosis Date   Cancer ([place])    skin cancer   Hyperlipidemia    Hypertension     Current Outpatient Medications:    colchicine 0.6 MG tablet, Take 1 tablet (0.6 mg total) by mouth daily., Disp: 30 tablet, Rfl: 0   methylPREDNISolone (MEDROL DOSEPAK) 4 MG TBPK tablet, Take as directed, Disp: 21 each, Rfl: 0   aspirin EC 81 MG tablet, Take 81 mg by mouth daily., Disp: , Rfl:    b complex vitamins capsule, Take 1 capsule by mouth daily., Disp: , Rfl:    Calcium Carbonate-Vit D-Min (CALCIUM 1200 PO), Take by mouth., Disp: , Rfl:    Cholecalciferol (VITAMIN D3 PO), Take by mouth., Disp: , Rfl:    Cyanocobalamin (B-12 PO), Take by mouth., Disp: , Rfl:    fluvastatin XL (LESCOL XL) 80 MG 24 hr tablet, , Disp: , Rfl: 1   hydrocortisone (PROCTO-MED HC) 2.5 % rectal cream, Place 1 application rectally 2 (two) times daily., Disp: , Rfl:    SYMBICORT 160-4.5 MCG/ACT inhaler, , Disp: , Rfl: 4   valsartan-hydrochlorothiazide (DIOVAN-HCT) 320-25 MG tablet, , Disp: ,  Rfl: 4  Social History   Tobacco Use  Smoking Status Former  Smokeless Tobacco Never    No Known Allergies Objective:  There were no vitals filed for this visit. There is no height or weight on file to calculate BMI. Constitutional Well developed. Well nourished.  Vascular Dorsalis pedis pulses palpable bilaterally. Posterior tibial pulses palpable bilaterally. Capillary refill normal to all digits.  No cyanosis or clubbing noted. Pedal hair growth normal.  Neurologic Normal speech. Oriented to person, place, and time. Epicritic sensation to light touch grossly present bilaterally.  Dermatologic Nails well groomed and normal in appearance. No open wounds. No skin lesions.  Orthopedic: Pain on palpation left first metatarsophalangeal joint.  Red hot swollen joint noted.  Pain with range of motion of the MPJ joint.  No deep intra-articular first MPJ pain noted.   Radiographs: None Assessment:   1. Acute idiopathic gout involving toe of left foot   2. Capsulitis of metatarsophalangeal (MTP) joint of left foot    Plan:  Patient was evaluated and treated and all questions answered.  Left first MPJ capsulitis with underlying gout flare -I explained to the patient the etiology of gout flare and various treatment options were discussed.  At this time she clinically continues to have pain that seems to be about the same slightly better.  She states that she has not taken any kind of Medication in the past she does not have any history of gout. -I will issue benefit from colchicine and Medrol Dosepak to help with controlling the gout flare. -She will also benefit from a steroid injection.  She agrees with the plan like to proceed with steroid injection -A steroid injection was performed at left first MPJ using 1% plain Lidocaine and 10 mg of Kenalog. This was well tolerated. -If this continues to happen patient will benefit from further work-up including rheumatology follow-up.  She  states understanding   No follow-ups on file.

## 2021-04-19 NOTE — Progress Notes (Signed)
me

## 2021-04-27 ENCOUNTER — Other Ambulatory Visit: Payer: Self-pay

## 2021-04-27 ENCOUNTER — Ambulatory Visit
Admission: RE | Admit: 2021-04-27 | Discharge: 2021-04-27 | Disposition: A | Discharge status: Home / Self Care | Payer: Medicare Other | Source: Ambulatory Visit | Attending: Family Medicine | Admitting: Family Medicine

## 2021-04-27 DIAGNOSIS — Z1231 Encounter for screening mammogram for malignant neoplasm of breast: Secondary | ICD-10-CM | POA: Diagnosis not present

## 2021-04-28 ENCOUNTER — Telehealth: Payer: Self-pay | Admitting: *Deleted

## 2021-04-28 NOTE — Telephone Encounter (Signed)
"  This is Shirley Nichols at [employer].  I am calling in reference to [facility].  She's been taking Colchicine for a flare up of Gout.  That episode has passed.  She called and asked me yesterday if she's suppose to continue taking that as a preventative.  I told her I would ask Dr. Posey Pronto.  Please let me know and I will relay it to the patient.  Thank you so much."

## 2021-04-29 NOTE — Telephone Encounter (Signed)
I called and informed Arbie Cookey at [facility] about Dr. Serita Grit response which was that the Colchicine is to only be used for flare-ups.

## 2021-05-19 ENCOUNTER — Other Ambulatory Visit: Payer: Self-pay

## 2021-05-19 ENCOUNTER — Ambulatory Visit (INDEPENDENT_AMBULATORY_CARE_PROVIDER_SITE_OTHER): Payer: Medicare Other | Admitting: Podiatry

## 2021-05-19 DIAGNOSIS — M7752 Other enthesopathy of left foot: Secondary | ICD-10-CM

## 2021-05-19 DIAGNOSIS — M10072 Idiopathic gout, left ankle and foot: Secondary | ICD-10-CM | POA: Diagnosis not present

## 2021-05-19 NOTE — Progress Notes (Signed)
°  Subjective:  Patient ID: Shirley Nichols, female    DOB: [birth year],  MRN: [id number]  Chief Complaint  Patient presents with   Gout    Pt stated that her toe is doing a lot better     80 y.o. female presents with the above complaint.  Patient presents with left first metatarsophalangeal joint gout flare.  She states she is doing a lot better.  She states that the injection as well as medication has helped considerably.  She denies any other acute complaints.   Review of Systems: Negative except as noted in the HPI. Denies N/V/F/Ch.  Past Medical History:  Diagnosis Date   Cancer ([place])    skin cancer   Hyperlipidemia    Hypertension     Current Outpatient Medications:    aspirin EC 81 MG tablet, Take 81 mg by mouth daily., Disp: , Rfl:    b complex vitamins capsule, Take 1 capsule by mouth daily., Disp: , Rfl:    Calcium Carbonate-Vit D-Min (CALCIUM 1200 PO), Take by mouth., Disp: , Rfl:    Cholecalciferol (VITAMIN D3 PO), Take by mouth., Disp: , Rfl:    colchicine 0.6 MG tablet, Take 1 tablet (0.6 mg total) by mouth daily., Disp: 30 tablet, Rfl: 0   Cyanocobalamin (B-12 PO), Take by mouth., Disp: , Rfl:    fluvastatin XL (LESCOL XL) 80 MG 24 hr tablet, , Disp: , Rfl: 1   hydrocortisone (PROCTO-MED HC) 2.5 % rectal cream, Place 1 application rectally 2 (two) times daily., Disp: , Rfl:    methylPREDNISolone (MEDROL DOSEPAK) 4 MG TBPK tablet, Take as directed, Disp: 21 each, Rfl: 0   SYMBICORT 160-4.5 MCG/ACT inhaler, , Disp: , Rfl: 4   valsartan-hydrochlorothiazide (DIOVAN-HCT) 320-25 MG tablet, , Disp: , Rfl: 4  Social History   Tobacco Use  Smoking Status Former  Smokeless Tobacco Never    No Known Allergies Objective:  There were no vitals filed for this visit. There is no height or weight on file to calculate BMI. Constitutional Well developed. Well nourished.  Vascular Dorsalis pedis pulses palpable bilaterally. Posterior tibial pulses palpable  bilaterally. Capillary refill normal to all digits.  No cyanosis or clubbing noted. Pedal hair growth normal.  Neurologic Normal speech. Oriented to person, place, and time. Epicritic sensation to light touch grossly present bilaterally.  Dermatologic Nails well groomed and normal in appearance. No open wounds. No skin lesions.  Orthopedic: No further pain on palpation left first metatarsophalangeal joint.  No further red hot swollen joint noted.  No further pain with range of motion of the MPJ joint.  No deep intra-articular first MPJ pain noted.   Radiographs: None Assessment:   No diagnosis found.  Plan:  Patient was evaluated and treated and all questions answered.  Left first MPJ capsulitis with underlying gout flare -Clinically doing much better with oral medication as well as a steroid injection.  Her pain is nonexistent.  I once again discussed shoe gear modification as well as diet management.  If this continues to happen more often we will do a further work-up and long-term management of gout.  For now she would like to proceed with diet management. -If this continues to happen patient will benefit from further work-up including rheumatology follow-up.  She states understanding   No follow-ups on file.

## 2022-02-20 ENCOUNTER — Encounter: Payer: Self-pay | Admitting: Podiatry

## 2022-02-20 ENCOUNTER — Ambulatory Visit (INDEPENDENT_AMBULATORY_CARE_PROVIDER_SITE_OTHER): Payer: Medicare Other | Admitting: Podiatry

## 2022-02-20 DIAGNOSIS — M79676 Pain in unspecified toe(s): Secondary | ICD-10-CM | POA: Diagnosis not present

## 2022-02-20 DIAGNOSIS — D2371 Other benign neoplasm of skin of right lower limb, including hip: Secondary | ICD-10-CM | POA: Diagnosis not present

## 2022-02-20 DIAGNOSIS — B351 Tinea unguium: Secondary | ICD-10-CM | POA: Diagnosis not present

## 2022-02-20 DIAGNOSIS — D2372 Other benign neoplasm of skin of left lower limb, including hip: Secondary | ICD-10-CM

## 2022-02-20 NOTE — Progress Notes (Signed)
She presents today chief complaint of painful lesions to the plantar aspect of the bilateral foot right over left.  Objective: Vital signs are stable alert oriented x3 benign skin lesion Sub first metatarsophalangeal joint of the right foot and left foot.  Assessment benign skin lesions bilateral.  Plan: Mechanical and chemical debridement today with Salinocaine applied under occlusion to be left on for 3 days before being washed off thoroughly.  She will follow-up with me in 6 weeks

## 2022-03-03 ENCOUNTER — Emergency Department: Payer: PPO

## 2022-03-03 ENCOUNTER — Encounter (HOSPITAL_COMMUNITY): Payer: Self-pay | Admitting: Family Medicine

## 2022-03-03 ENCOUNTER — Emergency Department
Admission: EM | Admit: 2022-03-03 | Discharge: 2022-03-03 | DRG: 445 | Disposition: A | Discharge status: Other Acute Inpatient Hospital | Payer: PPO | Source: Ambulatory Visit | Attending: Internal Medicine | Admitting: Internal Medicine

## 2022-03-03 ENCOUNTER — Inpatient Hospital Stay (HOSPITAL_COMMUNITY)
Admission: AD | Admit: 2022-03-03 | Discharge: 2022-03-23 | DRG: 444 | Disposition: A | Discharge status: Rehabilitation Facility | Payer: PPO | Source: Other Acute Inpatient Hospital | Attending: Family Medicine | Admitting: Family Medicine

## 2022-03-03 ENCOUNTER — Encounter (HOSPITAL_COMMUNITY): Payer: Self-pay

## 2022-03-03 ENCOUNTER — Encounter: Payer: Self-pay | Admitting: Emergency Medicine

## 2022-03-03 ENCOUNTER — Other Ambulatory Visit: Payer: Self-pay

## 2022-03-03 ENCOUNTER — Inpatient Hospital Stay: Payer: PPO

## 2022-03-03 DIAGNOSIS — N1832 Chronic kidney disease, stage 3b: Secondary | ICD-10-CM | POA: Diagnosis present

## 2022-03-03 DIAGNOSIS — C259 Malignant neoplasm of pancreas, unspecified: Secondary | ICD-10-CM | POA: Diagnosis present

## 2022-03-03 DIAGNOSIS — R634 Abnormal weight loss: Secondary | ICD-10-CM | POA: Diagnosis present

## 2022-03-03 DIAGNOSIS — I10 Essential (primary) hypertension: Secondary | ICD-10-CM | POA: Diagnosis not present

## 2022-03-03 DIAGNOSIS — K831 Obstruction of bile duct: Principal | ICD-10-CM | POA: Diagnosis present

## 2022-03-03 DIAGNOSIS — E440 Moderate protein-calorie malnutrition: Secondary | ICD-10-CM | POA: Diagnosis present

## 2022-03-03 DIAGNOSIS — J690 Pneumonitis due to inhalation of food and vomit: Secondary | ICD-10-CM | POA: Diagnosis not present

## 2022-03-03 DIAGNOSIS — G40001 Localization-related (focal) (partial) idiopathic epilepsy and epileptic syndromes with seizures of localized onset, not intractable, with status epilepticus: Secondary | ICD-10-CM | POA: Diagnosis not present

## 2022-03-03 DIAGNOSIS — H539 Unspecified visual disturbance: Secondary | ICD-10-CM | POA: Diagnosis not present

## 2022-03-03 DIAGNOSIS — Z8507 Personal history of malignant neoplasm of pancreas: Secondary | ICD-10-CM

## 2022-03-03 DIAGNOSIS — K9189 Other postprocedural complications and disorders of digestive system: Secondary | ICD-10-CM

## 2022-03-03 DIAGNOSIS — Z7982 Long term (current) use of aspirin: Secondary | ICD-10-CM

## 2022-03-03 DIAGNOSIS — Z90710 Acquired absence of both cervix and uterus: Secondary | ICD-10-CM

## 2022-03-03 DIAGNOSIS — K859 Acute pancreatitis without necrosis or infection, unspecified: Secondary | ICD-10-CM | POA: Diagnosis not present

## 2022-03-03 DIAGNOSIS — E785 Hyperlipidemia, unspecified: Secondary | ICD-10-CM | POA: Diagnosis present

## 2022-03-03 DIAGNOSIS — Z87891 Personal history of nicotine dependence: Secondary | ICD-10-CM | POA: Diagnosis not present

## 2022-03-03 DIAGNOSIS — I959 Hypotension, unspecified: Secondary | ICD-10-CM | POA: Diagnosis not present

## 2022-03-03 DIAGNOSIS — K8689 Other specified diseases of pancreas: Secondary | ICD-10-CM | POA: Diagnosis not present

## 2022-03-03 DIAGNOSIS — Z515 Encounter for palliative care: Secondary | ICD-10-CM

## 2022-03-03 DIAGNOSIS — I129 Hypertensive chronic kidney disease with stage 1 through stage 4 chronic kidney disease, or unspecified chronic kidney disease: Secondary | ICD-10-CM | POA: Diagnosis present

## 2022-03-03 DIAGNOSIS — G40101 Localization-related (focal) (partial) symptomatic epilepsy and epileptic syndromes with simple partial seizures, not intractable, with status epilepticus: Secondary | ICD-10-CM | POA: Diagnosis not present

## 2022-03-03 DIAGNOSIS — R569 Unspecified convulsions: Secondary | ICD-10-CM

## 2022-03-03 DIAGNOSIS — E860 Dehydration: Secondary | ICD-10-CM | POA: Diagnosis present

## 2022-03-03 DIAGNOSIS — E162 Hypoglycemia, unspecified: Secondary | ICD-10-CM | POA: Diagnosis present

## 2022-03-03 DIAGNOSIS — N179 Acute kidney failure, unspecified: Secondary | ICD-10-CM | POA: Diagnosis present

## 2022-03-03 DIAGNOSIS — R7989 Other specified abnormal findings of blood chemistry: Secondary | ICD-10-CM | POA: Diagnosis not present

## 2022-03-03 DIAGNOSIS — D509 Iron deficiency anemia, unspecified: Secondary | ICD-10-CM | POA: Diagnosis present

## 2022-03-03 DIAGNOSIS — R4701 Aphasia: Secondary | ICD-10-CM | POA: Diagnosis not present

## 2022-03-03 DIAGNOSIS — E669 Obesity, unspecified: Secondary | ICD-10-CM | POA: Diagnosis present

## 2022-03-03 DIAGNOSIS — R7401 Elevation of levels of liver transaminase levels: Secondary | ICD-10-CM | POA: Diagnosis present

## 2022-03-03 DIAGNOSIS — C250 Malignant neoplasm of head of pancreas: Secondary | ICD-10-CM | POA: Diagnosis not present

## 2022-03-03 DIAGNOSIS — C801 Malignant (primary) neoplasm, unspecified: Secondary | ICD-10-CM | POA: Diagnosis not present

## 2022-03-03 DIAGNOSIS — J9601 Acute respiratory failure with hypoxia: Secondary | ICD-10-CM

## 2022-03-03 DIAGNOSIS — Z85828 Personal history of other malignant neoplasm of skin: Secondary | ICD-10-CM

## 2022-03-03 DIAGNOSIS — J440 Chronic obstructive pulmonary disease with acute lower respiratory infection: Secondary | ICD-10-CM | POA: Diagnosis not present

## 2022-03-03 DIAGNOSIS — G928 Other toxic encephalopathy: Secondary | ICD-10-CM | POA: Diagnosis not present

## 2022-03-03 DIAGNOSIS — E279 Disorder of adrenal gland, unspecified: Secondary | ICD-10-CM | POA: Diagnosis present

## 2022-03-03 DIAGNOSIS — R739 Hyperglycemia, unspecified: Secondary | ICD-10-CM | POA: Diagnosis not present

## 2022-03-03 DIAGNOSIS — N390 Urinary tract infection, site not specified: Secondary | ICD-10-CM | POA: Diagnosis present

## 2022-03-03 DIAGNOSIS — K869 Disease of pancreas, unspecified: Secondary | ICD-10-CM | POA: Diagnosis not present

## 2022-03-03 DIAGNOSIS — Z66 Do not resuscitate: Secondary | ICD-10-CM | POA: Diagnosis present

## 2022-03-03 DIAGNOSIS — D6489 Other specified anemias: Secondary | ICD-10-CM | POA: Diagnosis present

## 2022-03-03 DIAGNOSIS — Z6824 Body mass index (BMI) 24.0-24.9, adult: Secondary | ICD-10-CM

## 2022-03-03 DIAGNOSIS — Z7951 Long term (current) use of inhaled steroids: Secondary | ICD-10-CM

## 2022-03-03 DIAGNOSIS — Z79899 Other long term (current) drug therapy: Secondary | ICD-10-CM

## 2022-03-03 DIAGNOSIS — E876 Hypokalemia: Secondary | ICD-10-CM

## 2022-03-03 DIAGNOSIS — G9340 Encephalopathy, unspecified: Secondary | ICD-10-CM | POA: Diagnosis not present

## 2022-03-03 DIAGNOSIS — R339 Retention of urine, unspecified: Secondary | ICD-10-CM | POA: Diagnosis not present

## 2022-03-03 DIAGNOSIS — N289 Disorder of kidney and ureter, unspecified: Secondary | ICD-10-CM | POA: Diagnosis not present

## 2022-03-03 DIAGNOSIS — Z23 Encounter for immunization: Secondary | ICD-10-CM | POA: Diagnosis present

## 2022-03-03 LAB — URINALYSIS, ROUTINE W REFLEX MICROSCOPIC
Bilirubin Urine: NEGATIVE
Glucose, UA: NEGATIVE mg/dL
Hgb urine dipstick: NEGATIVE
Ketones, ur: NEGATIVE mg/dL
Leukocytes,Ua: NEGATIVE
Nitrite: NEGATIVE
Protein, ur: NEGATIVE mg/dL
Specific Gravity, Urine: 1.017 (ref 1.005–1.030)
pH: 5 (ref 5.0–8.0)

## 2022-03-03 LAB — COMPREHENSIVE METABOLIC PANEL
ALT: 229 U/L — ABNORMAL HIGH (ref 0–44)
AST: 188 U/L — ABNORMAL HIGH (ref 15–41)
Albumin: 3.3 g/dL — ABNORMAL LOW (ref 3.5–5.0)
Alkaline Phosphatase: 739 U/L — ABNORMAL HIGH (ref 38–126)
Anion gap: 18 — ABNORMAL HIGH (ref 5–15)
BUN: 57 mg/dL — ABNORMAL HIGH (ref 8–23)
CO2: 25 mmol/L (ref 22–32)
Calcium: 9.2 mg/dL (ref 8.9–10.3)
Chloride: 93 mmol/L — ABNORMAL LOW (ref 98–111)
Creatinine, Ser: 2.46 mg/dL — ABNORMAL HIGH (ref 0.44–1.00)
GFR, Estimated: 19 mL/min — ABNORMAL LOW (ref >60)
Glucose, Bld: 123 mg/dL — ABNORMAL HIGH (ref 70–99)
Potassium: 3.6 mmol/L (ref 3.5–5.1)
Sodium: 136 mmol/L (ref 135–145)
Total Bilirubin: 5.1 mg/dL — ABNORMAL HIGH (ref 0.3–1.2)
Total Protein: 7.6 g/dL (ref 6.5–8.1)

## 2022-03-03 LAB — CBC
HCT: 39.6 % (ref 36.0–46.0)
Hemoglobin: 13 g/dL (ref 12.0–15.0)
MCH: 26.3 pg (ref 26.0–34.0)
MCHC: 32.8 g/dL (ref 30.0–36.0)
MCV: 80 fL (ref 80.0–100.0)
Platelets: 323 10*3/uL (ref 150–400)
RBC: 4.95 MIL/uL (ref 3.87–5.11)
RDW: 15.6 % — ABNORMAL HIGH (ref 11.5–15.5)
WBC: 10.1 10*3/uL (ref 4.0–10.5)
nRBC: 0 % (ref 0.0–0.2)

## 2022-03-03 LAB — LIPASE, BLOOD: Lipase: 342 U/L — ABNORMAL HIGH (ref 11–51)

## 2022-03-03 MED ORDER — ONDANSETRON HCL 4 MG/2ML IJ SOLN
4.0000 mg | Freq: Four times a day (QID) | INTRAMUSCULAR | Status: DC | PRN
  Administered 2022-03-14: 4 mg via INTRAVENOUS
  Filled 2022-03-03 (×2): qty 2

## 2022-03-03 MED ORDER — LACTATED RINGERS IV SOLN: INTRAVENOUS | Status: DC

## 2022-03-03 MED ORDER — GADOBUTROL 1 MMOL/ML IV SOLN
6.0000 mL | Freq: Once | INTRAVENOUS | Status: AC | PRN
  Administered 2022-03-03: 6 mL via INTRAVENOUS

## 2022-03-03 MED ORDER — HEPARIN SODIUM (PORCINE) 5000 UNIT/ML IJ SOLN
5000.0000 [IU] | Freq: Three times a day (TID) | INTRAMUSCULAR | Status: DC
  Administered 2022-03-03 – 2022-03-05 (×4): 5000 [IU] via SUBCUTANEOUS
  Filled 2022-03-03 (×4): qty 1

## 2022-03-03 MED ORDER — SODIUM CHLORIDE 0.9 % IV BOLUS
1000.0000 mL | Freq: Once | INTRAVENOUS | Status: AC
  Administered 2022-03-03: 1000 mL via INTRAVENOUS

## 2022-03-03 MED ORDER — LORAZEPAM 1 MG PO TABS
0.5000 mg | ORAL_TABLET | Freq: Once | ORAL | Status: AC
  Administered 2022-03-03: 0.5 mg via ORAL
  Filled 2022-03-03: qty 1

## 2022-03-03 MED ORDER — FENTANYL CITRATE PF 50 MCG/ML IJ SOSY: 12.5000 ug | PREFILLED_SYRINGE | INTRAMUSCULAR | Status: DC | PRN

## 2022-03-03 MED ORDER — ONDANSETRON HCL 4 MG PO TABS: 4.0000 mg | ORAL_TABLET | Freq: Four times a day (QID) | ORAL | Status: DC | PRN

## 2022-03-03 MED ORDER — HYDRALAZINE HCL 25 MG PO TABS
25.0000 mg | ORAL_TABLET | Freq: Four times a day (QID) | ORAL | Status: DC | PRN
  Administered 2022-03-06 – 2022-03-10 (×2): 25 mg via ORAL
  Filled 2022-03-03 (×2): qty 1

## 2022-03-03 NOTE — ED Triage Notes (Signed)
Pt presents via POV with c/o elevated liver enzymes on recent blood work at PCP. Pt denies abdominal pain, but reports vomiting 1 time. Pt denies any changes in bowel movement or blood in stool. Pt denies starting any new medications

## 2022-03-03 NOTE — ED Notes (Signed)
Called [facility] for transfer spoke to [employer] [phone]

## 2022-03-03 NOTE — ED Notes (Signed)
[facility] accepts patient waiting for bed assignment

## 2022-03-03 NOTE — H&P (Signed)
History and Physical    Shirley Nichols TFT:[id number] DOB: 24-Jan-1941 DOA: 03/03/2022  PCP: Remi Haggard, FNP   Patient coming from: Home   Chief Complaint: Abnormal labs   HPI: Shirley Nichols is a pleasant 81 y.o. female with medical history significant for hypertension and hyperlipidemia, who presented to the emergency department for evaluation of elevated LFTs on outpatient blood work.  Patient reports that she had normal blood work in August 2023 but saw her PCP again this week due to general weakness, had blood work repeated, and was notified that her LFTs were elevated and that she should seek evaluation in the emergency department.  She was also told that she was dehydrated and advised to start drinking Pedialyte.  She denies any abdominal pain, nausea, diarrhea, or fever.  [facility] ED Course: Upon arrival to the ED, patient is found to be afebrile and saturating mid 90s on room air with normal heart rate and stable blood pressure.  Blood work notable for BUN 57, creatinine 2.46, alkaline phosphatase 739, AST 188, ALT 229, total bilirubin 5.1, and lipase 342.  MRCP reveals 3.4 cm masslike density in the pancreatic head suspicious for pancreatic carcinoma with distal CBD stricture and diffuse biliary ductal dilatation.  GI at [facility] (Dr. Vicente Males) was consulted by the ED physician and recommended transfer to facility with ERCP capability.  ED physician then spoke with [facility] who agreed to see the patient in consultation.  She was then transferred to [facility] for admission.  Review of Systems:  All other systems reviewed and apart from HPI, are negative.  Past Medical History:  Diagnosis Date   Cancer [facility])    skin cancer   Hyperlipidemia    Hypertension     Past Surgical History:  Procedure Laterality Date   ABDOMINAL HYSTERECTOMY     COLONOSCOPY     02/21/2002 adenomatous polyp, 03/23/2011   COLONOSCOPY WITH PROPOFOL N/A 11/03/2016   Procedure: COLONOSCOPY WITH  PROPOFOL;  Surgeon: Manya Silvas, MD;  Location: [facility] ENDOSCOPY;  Service: Endoscopy;  Laterality: N/A;   HEMORROIDECTOMY     TONSILLECTOMY      Social History:   reports that she has quit smoking. She has never used smokeless tobacco. She reports that she does not drink alcohol and does not use drugs.  No Known Allergies  Family History  Problem Relation Age of Onset   Breast cancer Neg Hx      Prior to Admission medications   Medication Sig Start Date End Date Taking? Authorizing Provider  SYMBICORT 160-4.5 MCG/ACT inhaler Inhale 2 puffs into the lungs as needed (for wheezing or shortness of breath). 02/05/16  Yes [provider]  aspirin EC 81 MG tablet Take 81 mg by mouth daily.    [provider]  ezetimibe (ZETIA) 10 MG tablet Take 10 mg by mouth daily. 12/12/21   [provider]  fluvastatin XL (LESCOL XL) 80 MG 24 hr tablet Take 80 mg by mouth daily. 02/19/16   [provider]  valsartan-hydrochlorothiazide (DIOVAN-HCT) 320-25 MG tablet Take 1 tablet by mouth daily. 01/28/16   [provider]    Physical Exam: There were no vitals filed for this visit.  Constitutional: NAD, calm  Eyes: PERTLA, lids and conjunctivae normal ENMT: Mucous membranes are moist. Posterior pharynx clear of any exudate or lesions.   Neck: supple, no masses  Respiratory: no wheezing, no crackles. No accessory muscle use.  Cardiovascular: S1 & S2 heard, regular rate and  rhythm. No extremity edema.   Abdomen: No distension, no tenderness, soft. Bowel sounds active.  Musculoskeletal: no clubbing / cyanosis. No joint deformity upper and lower extremities.   Skin: no significant rashes, lesions, ulcers. Warm, dry, well-perfused. Neurologic: CN 2-12 grossly intact. Moving all extremities. Alert and oriented.  Psychiatric: Pleasant. Cooperative.    Labs and Imaging on Admission: I have personally reviewed following labs and imaging studies  CBC: Recent  Labs  Lab 03/03/22 1016  WBC 10.1  HGB 13.0  HCT 39.6  MCV 80.0  PLT 914   Basic Metabolic Panel: Recent Labs  Lab 03/03/22 1016  NA 136  K 3.6  CL 93*  CO2 25  GLUCOSE 123*  BUN 57*  CREATININE 2.46*  CALCIUM 9.2   GFR: Estimated Creatinine Clearance: 16.5 mL/min (A) (by C-G formula based on SCr of 2.46 mg/dL (H)). Liver Function Tests: Recent Labs  Lab 03/03/22 1016  AST 188*  ALT 229*  ALKPHOS 739*  BILITOT 5.1*  PROT 7.6  ALBUMIN 3.3*   Recent Labs  Lab 03/03/22 1016  LIPASE 342*   No results for input(s): "AMMONIA" in the last 168 hours. Coagulation Profile: No results for input(s): "INR", "PROTIME" in the last 168 hours. Cardiac Enzymes: No results for input(s): "CKTOTAL", "CKMB", "CKMBINDEX", "TROPONINI" in the last 168 hours. BNP (last 3 results) No results for input(s): "PROBNP" in the last 8760 hours. HbA1C: No results for input(s): "HGBA1C" in the last 72 hours. CBG: No results for input(s): "GLUCAP" in the last 168 hours. Lipid Profile: No results for input(s): "CHOL", "HDL", "LDLCALC", "TRIG", "CHOLHDL", "LDLDIRECT" in the last 72 hours. Thyroid Function Tests: No results for input(s): "TSH", "T4TOTAL", "FREET4", "T3FREE", "THYROIDAB" in the last 72 hours. Anemia Panel: No results for input(s): "VITAMINB12", "FOLATE", "FERRITIN", "TIBC", "IRON", "RETICCTPCT" in the last 72 hours. Urine analysis:    Component Value Date/Time   COLORURINE AMBER (A) 03/03/2022 1008   APPEARANCEUR CLOUDY (A) 03/03/2022 1008   LABSPEC 1.017 03/03/2022 1008   PHURINE 5.0 03/03/2022 1008   GLUCOSEU NEGATIVE 03/03/2022 1008   HGBUR NEGATIVE 03/03/2022 1008   BILIRUBINUR NEGATIVE 03/03/2022 1008   KETONESUR NEGATIVE 03/03/2022 1008   PROTEINUR NEGATIVE 03/03/2022 1008   NITRITE NEGATIVE 03/03/2022 1008   LEUKOCYTESUR NEGATIVE 03/03/2022 1008   Sepsis Labs: '@LABRCNTIP'$ (procalcitonin:4,lacticidven:4) )No results found for this or any previous visit (from the  past 240 hour(s)).   Radiological Exams on Admission: MR ABDOMEN MRCP W WO CONTAST  Result Date: 03/03/2022 CLINICAL DATA:  Jaundice. Pancreatitis. Biliary ductal dilatation. Evaluate for choledocholithiasis or pancreatic mass. EXAM: MRI ABDOMEN WITHOUT AND WITH CONTRAST (INCLUDING MRCP) TECHNIQUE: Multiplanar multisequence MR imaging of the abdomen was performed both before and after the administration of intravenous contrast. Heavily T2-weighted images of the biliary and pancreatic ducts were obtained, and three-dimensional MRCP images were rendered by post processing. CONTRAST:  29m GADAVIST GADOBUTROL 1 MMOL/ML IV SOLN COMPARISON:  Noncontrast CT on 03/03/2022 FINDINGS: Lower chest: No acute findings. Hepatobiliary: Image degradation by motion artifact noted. No hepatic masses identified. Gallbladder is distended, however there are no other findings of acute cholecystitis. Diffuse biliary ductal dilatation is seen, measuring 15 mm in diameter. Stricture of the distal common bile duct is seen in the pancreatic head. No evidence of choledocholithiasis. Pancreas: Image degradation by motion artifact noted. Mild ductal dilatation is seen in the pancreatic tail and body. A heterogeneously enhancing masslike density is seen in the pancreatic head, measuring 3.4 x 2.8 cm, highly suspicious for pancreatic carcinoma. Spleen:  Within normal limits in size and appearance. Adrenals/Urinary Tract: A 2.6 x 2.3 cm left adrenal mass is seen, with evaluation limited by motion artifact. This appears homogeneous with mild T2 hypointensity, favoring a benign adrenal adenoma. No suspicious renal masses identified. No evidence of hydronephrosis. Stomach/Bowel: Unremarkable. Vascular/Lymphatic: No pathologically enlarged lymph nodes identified. No acute vascular findings. Other:  None. Musculoskeletal:  No suspicious bone lesions identified. IMPRESSION: Image degradation by motion artifact noted. 3.4 cm masslike density in the  pancreatic head, highly suspicious for pancreatic carcinoma. 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 No evidence of choledocholithiasis. 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 Electronically Signed   By: Marlaine Hind M.D.   On: 03/03/2022 19:05   CT Abdomen Pelvis Wo Contrast  Result Date: 03/03/2022 CLINICAL DATA:  Elevated lipase; distended gallbladder and CBD on ultrasound earlier today; pancreatitis suspected with concern for obstructing mass EXAM: CT ABDOMEN AND PELVIS WITHOUT CONTRAST TECHNIQUE: Multidetector CT imaging of the abdomen and pelvis was performed following the standard protocol without IV contrast. RADIATION DOSE REDUCTION: This exam was performed according to the departmental dose-optimization program which includes automated exposure control, adjustment of the mA and/or kV according to patient size and/or use of iterative reconstruction technique. COMPARISON:  Ultrasound earlier today FINDINGS: Lower chest: Coronary artery calcification.  No acute abnormality. Hepatobiliary: Distended gallbladder and common bile duct which measures approximately 11 mm in greatest dimension. No radiopaque [place]. No suspicious hepatic lesion on noncontrast exam. Pancreas: No definite pancreatic duct dilation. There is mild haziness in the peripancreatic fat about the pancreatic head and uncinate process (series 2/image [id number]). Spleen: Normal in size without focal abnormality. Adrenals/Urinary Tract: Nodular low-density (< 10 HU) thickening of the left adrenal gland. Symmetric perinephric stranding. Bilateral focal cortical renal scarring. Low-attenuation lesions in the kidneys are statistically likely to represent cysts. No follow-up is required. Stomach/Bowel: Unremarkable stomach. Normal caliber large and small bowel. The appendix is not visualized. Vascular/Lymphatic: Aortic atherosclerosis. No  enlarged abdominal or pelvic lymph nodes. Reproductive: Hysterectomy. Other: No free intraperitoneal fluid or air. Musculoskeletal: Degenerative changes pubic symphysis, both hips, SI joints and lower lumbar spine. Anterolisthesis L4 on L5. No acute osseous abnormality. IMPRESSION: Hazy stranding about the pancreatic head suggestive of pancreatitis. This is incompletely evaluated without IV contrast. Dilated gallbladder and common bile duct. MRI/MRCP is recommended to evaluate for choledocholithiasis or obstructing mass. Electronically Signed   By: Placido Sou M.D.   On: 03/03/2022 14:33   US Abdomen Limited RUQ (LIVER/GB)  Result Date: 03/03/2022 CLINICAL DATA:  Transaminitis EXAM: ULTRASOUND ABDOMEN LIMITED RIGHT UPPER QUADRANT COMPARISON:  None Available. FINDINGS: Gallbladder: There is layering sludge in the distended gallbladder lumen but no shadowing stones. There is no gallbladder wall thickening or pericholecystic fluid. Sonographic Murphy's sign was reported negative. Common bile duct: Diameter: 1 cm.  There is intrahepatic biliary ductal dilatation. Liver: No focal lesion identified. Within normal limits in parenchymal echogenicity. Portal vein is patent on color Doppler imaging with normal direction of blood flow towards the liver. Other: None. IMPRESSION: 1. Distended gallbladder with layering sludge but no shadowing stones or other evidence of acute cholecystitis. 2. Intra and extrahepatic biliary ductal dilatation. This may be due to choledocholithiasis or an obstructing mass. Recommend MRI/MRCP for further evaluation. Electronically Signed   By: Valetta Mole M.D.   On: 03/03/2022 13:22    Assessment/Plan   1. Pancreatitic head mass with obstructive  jaundice  - Presents for evaluation of elevated LFTs on outpatient labs and found on MRCP to have likely pancreatic head mass with obstruction of distal CBD  - GI was consulted by ED MD  - GI notified by secure chat of patient's arrival at  [facility], plan to keep NPO after midnight, continue supportive care, trend LFTs    2. AKI  - SCr is 2.46 on admission  - She denies any hx of kidney disease and reports having normal labs with her PCP in August 2023  - No hydronephrosis on CT in ED  - Likely acute prerenal azotemia in setting of recent poor appetite  - Continue IVF hydration, hold valsartan-HCTZ, renally-dose medications, and repeat chem panel in am   3. Hypertension  - Hold valsartan-HCTZ and treat as-needed only for now    DVT prophylaxis: sq heparin  Code Status: Full  Level of Care: Level of care: Med-Surg Family Communication: Husband at bedside  Disposition Plan:  Patient is from: Home  Anticipated d/c is to: TBD Anticipated d/c date is: 03/06/22  Patient currently: Pending GI consultation, improvement in renal function  Consults called: GI Admission status: Inpatient     Shirley Bulls, MD [facility]  03/03/2022, 9:50 PM

## 2022-03-03 NOTE — ED Notes (Addendum)
Pt provides verbal consent for transfer at this time.

## 2022-03-03 NOTE — ED Provider Notes (Signed)
[facility] Provider Note   Event Date/Time   First MD Initiated Contact with Patient 03/03/22 1134     (approximate) History  abnormal labs  HPI Shirley Nichols is a 81 y.o. female with a stated past medical history of hypertension and hyperlipidemia who presents after being called by her primary care provider, Threasa Alpha, NP, and being told that her liver enzymes were elevated and to present to the emergency department for further evaluation.  Patient states that she was recently diagnosed with urinary tract infection and started on nitrofurantoin however she started this medication after her labs were drawn and does not feel like this is a reason for her enzymes to be high.  Patient denies any other new medications within the last month.  Patient denies any travel or sick contacts.  Patient denies any food out of the ordinary.  Patient does endorse one episode of vomiting 2 days ago however she states that this was because she got "choked up". ROS: Patient currently denies any vision changes, tinnitus, difficulty speaking, facial droop, sore throat, chest pain, shortness of breath, abdominal pain, nausea/vomiting/diarrhea, dysuria, or weakness/numbness/paresthesias in any extremity   Physical Exam  Triage Vital Signs: ED Triage Vitals  Enc Vitals Group     BP 03/03/22 1006 98/60     Pulse Rate 03/03/22 1006 74     Resp 03/03/22 1006 18     Temp 03/03/22 1006 97.6 F (36.4 C)     Temp Source 03/03/22 1006 Oral     SpO2 03/03/22 1006 91 %     Weight 03/03/22 1139 145 lb 1 oz (65.8 kg)     Height 03/03/22 1139 5' 4"  (1.626 m)     Head Circumference --      Peak Flow --      Pain Score 03/03/22 1015 0     Pain Loc --      Pain Edu? --      Excl. in [place]? --    Most recent vital signs: Vitals:   03/03/22 1849 03/03/22 1942  BP: (!) 138/53 135/82  Pulse: 74 72  Resp: 20 16  Temp: 97.7 F (36.5 C) 97.8 F (36.6 C)  SpO2: 97% 98%   General: Awake,  oriented x4. CV:  Good peripheral perfusion.  Resp:  Normal effort.  Abd:  No distention.  Other:  Elderly Caucasian female sitting on stretcher in no acute distress. ED Results / Procedures / Treatments  Labs (all labs ordered are listed, but only abnormal results are displayed) Labs Reviewed  LIPASE, BLOOD - Abnormal; Notable for the following components:      Result Value   Lipase 342 (*)    All other components within normal limits  COMPREHENSIVE METABOLIC PANEL - Abnormal; Notable for the following components:   Chloride 93 (*)    Glucose, Bld 123 (*)    BUN 57 (*)    Creatinine, Ser 2.46 (*)    Albumin 3.3 (*)    AST 188 (*)    ALT 229 (*)    Alkaline Phosphatase 739 (*)    Total Bilirubin 5.1 (*)    GFR, Estimated 19 (*)    Anion gap 18 (*)    All other components within normal limits  CBC - Abnormal; Notable for the following components:   RDW 15.6 (*)    All other components within normal limits  URINALYSIS, ROUTINE W REFLEX MICROSCOPIC - Abnormal; Notable for the following components:  Color, Urine AMBER (*)    APPearance CLOUDY (*)    All other components within normal limits  RADIOLOGY ED MD interpretation: MRCP interpreted by me and shows a 3.4 cm masslike density in the pancreatic head concerning for pancreatic carcinoma with associated diffuse biliary ductal dilation due to distal common bile duct stricture in the pancreatic head.  There is also a 2.6 cm left adrenal mass  CT of the abdomen and pelvis without IV contrast interpreted by me and shows a hazy stranding about the pancreatic head suggestive of pancreatitis  Right upper quadrant ultrasound interpreted by me and shows distended gallbladder with layering sludge but no shadowing stones or other evidence of acute cholecystitis as well as intra and extrahepatic biliary ductal dilation -Agree with radiology assessment Official radiology report(s): MR ABDOMEN MRCP W WO CONTAST  Result Date:  03/03/2022 CLINICAL DATA:  Jaundice. Pancreatitis. Biliary ductal dilatation. Evaluate for choledocholithiasis or pancreatic mass. EXAM: MRI ABDOMEN WITHOUT AND WITH CONTRAST (INCLUDING MRCP) TECHNIQUE: Multiplanar multisequence MR imaging of the abdomen was performed both before and after the administration of intravenous contrast. Heavily T2-weighted images of the biliary and pancreatic ducts were obtained, and three-dimensional MRCP images were rendered by post processing. CONTRAST:  67m GADAVIST GADOBUTROL 1 MMOL/ML IV SOLN COMPARISON:  Noncontrast CT on 03/03/2022 FINDINGS: Lower chest: No acute findings. Hepatobiliary: Image degradation by motion artifact noted. No hepatic masses identified. Gallbladder is distended, however there are no other findings of acute cholecystitis. Diffuse biliary ductal dilatation is seen, measuring 15 mm in diameter. Stricture of the distal common bile duct is seen in the pancreatic head. No evidence of choledocholithiasis. Pancreas: Image degradation by motion artifact noted. Mild ductal dilatation is seen in the pancreatic tail and body. A heterogeneously enhancing masslike density is seen in the pancreatic head, measuring 3.4 x 2.8 cm, highly suspicious for pancreatic carcinoma. Spleen:  Within normal limits in size and appearance. Adrenals/Urinary Tract: A 2.6 x 2.3 cm left adrenal mass is seen, with evaluation limited by motion artifact. This appears homogeneous with mild T2 hypointensity, favoring a benign adrenal adenoma. No suspicious renal masses identified. No evidence of hydronephrosis. Stomach/Bowel: Unremarkable. Vascular/Lymphatic: No pathologically enlarged lymph nodes identified. No acute vascular findings. Other:  None. Musculoskeletal:  No suspicious bone lesions identified. IMPRESSION: Image degradation by motion artifact noted. 3.4 cm masslike density in the pancreatic head, highly suspicious for pancreatic carcinoma. 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 No evidence of choledocholithiasis. 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 Electronically Signed   By: JMarlaine HindM.D.   On: 03/03/2022 19:05   CT Abdomen Pelvis Wo Contrast  Result Date: 03/03/2022 CLINICAL DATA:  Elevated lipase; distended gallbladder and CBD on ultrasound earlier today; pancreatitis suspected with concern for obstructing mass EXAM: CT ABDOMEN AND PELVIS WITHOUT CONTRAST TECHNIQUE: Multidetector CT imaging of the abdomen and pelvis was performed following the standard protocol without IV contrast. RADIATION DOSE REDUCTION: This exam was performed according to the departmental dose-optimization program which includes automated exposure control, adjustment of the mA and/or kV according to patient size and/or use of iterative reconstruction technique. COMPARISON:  Ultrasound earlier today FINDINGS: Lower chest: Coronary artery calcification.  No acute abnormality. Hepatobiliary: Distended gallbladder and common bile duct which measures approximately 11 mm in greatest dimension. No radiopaque Criger. No suspicious hepatic lesion on noncontrast exam. Pancreas: No definite pancreatic duct dilation. There is mild haziness in  the peripancreatic fat about the pancreatic head and uncinate process (series 2/image [id number]). Spleen: Normal in size without focal abnormality. Adrenals/Urinary Tract: Nodular low-density (< 10 HU) thickening of the left adrenal gland. Symmetric perinephric stranding. Bilateral focal cortical renal scarring. Low-attenuation lesions in the kidneys are statistically likely to represent cysts. No follow-up is required. Stomach/Bowel: Unremarkable stomach. Normal caliber large and small bowel. The appendix is not visualized. Vascular/Lymphatic: Aortic atherosclerosis. No enlarged abdominal or pelvic lymph nodes. Reproductive: Hysterectomy. Other: No free intraperitoneal  fluid or air. Musculoskeletal: Degenerative changes pubic symphysis, both hips, SI joints and lower lumbar spine. Anterolisthesis L4 on L5. No acute osseous abnormality. IMPRESSION: Hazy stranding about the pancreatic head suggestive of pancreatitis. This is incompletely evaluated without IV contrast. Dilated gallbladder and common bile duct. MRI/MRCP is recommended to evaluate for choledocholithiasis or obstructing mass. Electronically Signed   By: Placido Sou M.D.   On: 03/03/2022 14:33   US Abdomen Limited RUQ (LIVER/GB)  Result Date: 03/03/2022 CLINICAL DATA:  Transaminitis EXAM: ULTRASOUND ABDOMEN LIMITED RIGHT UPPER QUADRANT COMPARISON:  None Available. FINDINGS: Gallbladder: There is layering sludge in the distended gallbladder lumen but no shadowing stones. There is no gallbladder wall thickening or pericholecystic fluid. Sonographic Murphy's sign was reported negative. Common bile duct: Diameter: 1 cm.  There is intrahepatic biliary ductal dilatation. Liver: No focal lesion identified. Within normal limits in parenchymal echogenicity. Portal vein is patent on color Doppler imaging with normal direction of blood flow towards the liver. Other: None. IMPRESSION: 1. Distended gallbladder with layering sludge but no shadowing stones or other evidence of acute cholecystitis. 2. Intra and extrahepatic biliary ductal dilatation. This may be due to choledocholithiasis or an obstructing mass. Recommend MRI/MRCP for further evaluation. Electronically Signed   By: Valetta Mole M.D.   On: 03/03/2022 13:22   PROCEDURES: Critical Care performed: No .1-3 Lead EKG Interpretation  Performed by: Naaman Plummer, MD Authorized by: Naaman Plummer, MD     Interpretation: normal     ECG rate:  75   ECG rate assessment: normal     Rhythm: sinus rhythm     Ectopy: none     Conduction: normal    MEDICATIONS ORDERED IN ED: Medications  sodium chloride 0.9 % bolus 1,000 mL (0 mLs Intravenous Stopped 03/03/22  1816)  LORazepam (ATIVAN) tablet 0.5 mg (0.5 mg Oral Given 03/03/22 1552)  gadobutrol (GADAVIST) 1 MMOL/ML injection 6 mL (6 mLs Intravenous Contrast Given 03/03/22 1845)   IMPRESSION / MDM / ASSESSMENT AND PLAN / ED COURSE  I reviewed the triage vital signs and the nursing notes.                             Differential diagnosis includes, but is not limited to, cholecystitis, choledocholithiasis, ascending cholangitis, pancreatic mass, hepatitis The patient is on the cardiac monitor to evaluate for evidence of arrhythmia and/or significant heart rate changes. Patient's presentation is most consistent with acute presentation with potential threat to life or bodily function. Patient is an 81 year old female with the above-stated past medical history presents for laboratory abnormalities after recent lab at her primary care physician's office. Significant laboratory abnormalities include: Creatinine 2.46 with BUN of 57 AST/ALT 188/229 Alk phos 739 T. bili 5.1 Anion gap 18 Lipase 342 Significant imaging abnormalities include: Ultrasound showing dilated gallbladder and CBD concerning for biliary ductal obstruction CT of the abdomen and pelvis without IV contrast shows stranding around the pancreatic head  concerning for possible pancreatic mass which would explain the obstructive pattern MRCP resulted just prior to patient's transfer and shows likely 3.4 cm pancreatic head mass  Transfer: [facility]-talk to Dr. Vicente Males who recommended ERCP for this patient however because there are no gastroenterologist he will perform ERCP at this facility, recommended transfer To [facility]-I spoke to the on-call gastroenterologist who agreed to accept this patient in transfer.  I spoke to Dr. Roosevelt Locks in internal medicine who agrees to accept this patient in transfer  Dispo: Transfer to [facility] for further work-up of possible pancreatic head mass   FINAL CLINICAL IMPRESSION(S) / ED DIAGNOSES   Final diagnoses:   Biliary obstruction  Mass of head of pancreas   Rx / DC Orders   ED Discharge Orders     None      Note:  This document was prepared using Dragon voice recognition software and may include unintentional dictation errors.   Naaman Plummer, MD 03/03/22 1945

## 2022-03-03 NOTE — Progress Notes (Signed)
Patient presented to PCPs office for progressive painless jaundice, and sent to ED for elevated liver function study.  CT scan showed possible pancreatic head mass.  MRCP ordered, likely can be done this afternoon.  [facility]: Consulted, recommended patient come to [facility] for ERCP.  Vital signs stable, accepted to [place] admission.  Contact  GI once patient arrives, may need n.p.o. after midnight, increased GI will proceed with ERCP in the morning.

## 2022-03-03 NOTE — ED Notes (Signed)
Called [facility] spoke to [place] for transfer to [place]

## 2022-03-03 NOTE — Progress Notes (Signed)
Pt arrived to unit, assessed, informed of POC, page provider for floor orders at this time

## 2022-03-04 ENCOUNTER — Inpatient Hospital Stay (HOSPITAL_COMMUNITY): Payer: PPO

## 2022-03-04 DIAGNOSIS — E785 Hyperlipidemia, unspecified: Secondary | ICD-10-CM

## 2022-03-04 DIAGNOSIS — N179 Acute kidney failure, unspecified: Secondary | ICD-10-CM | POA: Diagnosis not present

## 2022-03-04 DIAGNOSIS — E876 Hypokalemia: Secondary | ICD-10-CM

## 2022-03-04 DIAGNOSIS — K831 Obstruction of bile duct: Secondary | ICD-10-CM | POA: Diagnosis not present

## 2022-03-04 DIAGNOSIS — I10 Essential (primary) hypertension: Secondary | ICD-10-CM | POA: Diagnosis not present

## 2022-03-04 LAB — CBC
HCT: 32.8 % — ABNORMAL LOW (ref 36.0–46.0)
Hemoglobin: 10.9 g/dL — ABNORMAL LOW (ref 12.0–15.0)
MCH: 26.4 pg (ref 26.0–34.0)
MCHC: 33.2 g/dL (ref 30.0–36.0)
MCV: 79.4 fL — ABNORMAL LOW (ref 80.0–100.0)
Platelets: 274 10*3/uL (ref 150–400)
RBC: 4.13 MIL/uL (ref 3.87–5.11)
RDW: 15.2 % (ref 11.5–15.5)
WBC: 8.2 10*3/uL (ref 4.0–10.5)
nRBC: 0 % (ref 0.0–0.2)

## 2022-03-04 LAB — COMPREHENSIVE METABOLIC PANEL
ALT: 169 U/L — ABNORMAL HIGH (ref 0–44)
AST: 135 U/L — ABNORMAL HIGH (ref 15–41)
Albumin: 2.3 g/dL — ABNORMAL LOW (ref 3.5–5.0)
Alkaline Phosphatase: 588 U/L — ABNORMAL HIGH (ref 38–126)
Anion gap: 12 (ref 5–15)
BUN: 48 mg/dL — ABNORMAL HIGH (ref 8–23)
CO2: 24 mmol/L (ref 22–32)
Calcium: 8.6 mg/dL — ABNORMAL LOW (ref 8.9–10.3)
Chloride: 100 mmol/L (ref 98–111)
Creatinine, Ser: 2.31 mg/dL — ABNORMAL HIGH (ref 0.44–1.00)
GFR, Estimated: 21 mL/min — ABNORMAL LOW (ref >60)
Glucose, Bld: 106 mg/dL — ABNORMAL HIGH (ref 70–99)
Potassium: 3.1 mmol/L — ABNORMAL LOW (ref 3.5–5.1)
Sodium: 136 mmol/L (ref 135–145)
Total Bilirubin: 3.9 mg/dL — ABNORMAL HIGH (ref 0.3–1.2)
Total Protein: 5.6 g/dL — ABNORMAL LOW (ref 6.5–8.1)

## 2022-03-04 LAB — PROTEIN / CREATININE RATIO, URINE
Creatinine, Urine: 49 mg/dL
Protein Creatinine Ratio: 0.37 mg/mg{Cre} — ABNORMAL HIGH (ref 0.00–0.15)
Total Protein, Urine: 18 mg/dL

## 2022-03-04 LAB — SODIUM, URINE, RANDOM: Sodium, Ur: 78 mmol/L

## 2022-03-04 LAB — CREATININE, URINE, RANDOM: Creatinine, Urine: 49 mg/dL

## 2022-03-04 MED ORDER — POTASSIUM CHLORIDE 2 MEQ/ML IV SOLN
INTRAVENOUS | Status: DC
  Filled 2022-03-04 (×4): qty 1000

## 2022-03-04 MED ORDER — MOMETASONE FURO-FORMOTEROL FUM 200-5 MCG/ACT IN AERO
2.0000 | INHALATION_SPRAY | Freq: Two times a day (BID) | RESPIRATORY_TRACT | Status: DC
  Administered 2022-03-06 – 2022-03-10 (×8): 2 via RESPIRATORY_TRACT
  Filled 2022-03-04 (×2): qty 8.8

## 2022-03-04 NOTE — Assessment & Plan Note (Signed)
-   Supplemented and resolved 

## 2022-03-04 NOTE — Assessment & Plan Note (Signed)
-   Hold statin given transaminitis - Hold Zetia

## 2022-03-04 NOTE — Progress Notes (Signed)
  Progress Note   Patient: Shirley Nichols PFX:[id number] DOB: [birth year] DOA: 03/03/2022     1 DOS: the patient was seen and examined on 03/04/2022 at 10:50AM      Brief hospital course: Mrs. Zaugg is an 81 y.o. F with HTN, HLD who presented with hyperbilirubinemia and renal failure.  Developed generalized weakness in the last few weeks, went to PCP, found to have jaundice, elevated LFTs and renal failure, sent to the ER.   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  Here creatinine 2.46 on admission, improved to 2.3 overnight with fluids - Hold ARB, diuretic - Continue IV fluids - Check renal ultrasound, urine electrolytes, urine protein, SPEP  Hypertension Blood pressure normal - Hold valsartan, HCTZ due to renal failure           Subjective: Patient has no abdominal pain, vomiting, confusion, respiratory symptoms.  She is making urine.     Physical Exam: BP 114/88 (BP Location: Left Arm)   Pulse 71   Temp 97.8 F (36.6 C) (Oral)   Resp 18   SpO2 100%   Thin elderly female, lying in bed, no acute distress, sclera icteric RRR, no murmurs, no peripheral edema Respiratory rate normal, lungs clear without rales or wheezes Abdomen soft without tenderness palpation or guarding, no ascites or distention Attention normal, affect appropriate, judgment and insight appear normal    Data Reviewed: Communicated with Dr. Benson Norway Basic metabolic panel shows new hypokalemia, improving renal function Urinalysis without cells Hemoglobin 10, no change Total bilirubin 5.1 improved to  3.9 MRCP shows a masslike density in the head of the pancreas with ductal dilation, LFTs no significant change  Family Communication: Husband at the bedside    Disposition: Status is: Inpatient The patient was admitted with obstructive jaundice  She will need GI evaluation for possible biopsy possible stenting of biliary tree.    She also has AKI. Dispo pending GI work-up and improvement of renal function        Author: Edwin Dada, MD 03/04/2022 1:46 PM  For on call review [email or web address].

## 2022-03-04 NOTE — Progress Notes (Signed)
Mobility Specialist Progress Note:   03/04/22 1623  Mobility  Activity Ambulated independently in hallway  Activity Response Tolerated well  Distance Ambulated (ft) 200 ft  $Mobility charge 1 Mobility  Level of Assistance Modified independent, requires aide device or extra time  Assistive Device Other (Comment) (IV Pole)  Mobility Referral Yes   Pt received in bed and agreeable. No complaints. Pt left sitting EOB with all needs met, call bell in reach, and family in room.   Ashwin Tibbs Mobility Specialist-Acute Rehab Secure Chat only

## 2022-03-04 NOTE — Hospital Course (Addendum)
81 y.o. F with HTN, HLD who presented with hyperbilirubinemia and renal failure.  Developed generalized weakness in the last few weeks, went to PCP, found to have jaundice, elevated LFTs and AKI, sent to the ER.   10/6: Admitted, MRCP shows masslike density in the head of pancreas with biliary obstruction 10/7: GI consulted 10/8: ERCP with stent placement 10/9: New post-ERCP pancreatitis, Cr back up 10/10: Pancreatitis resolved, Cr improving on fulids  On 10/13, found to have delirum and unresponsiveness, transferred to ICU. While in ICU, had seizures, requiring intubation. Neurology followed for EEG. Pt exutabated 10/19

## 2022-03-04 NOTE — Consult Note (Signed)
Reason for Consult: Pancreatic head mass and obstructive jaundice Referring Physician: [facility]  Michel H Degollado HPI: This is an 81 year old female with a PMH of hyperlipidemia, HTN, and a prior history of tobacco abuse (x 20 years) admitted with obstructive jaundice.  For the past couple of weeks the patient was feeling fatigued and now well.  She noticed a worsening of weight loss and she sought further evaluation from her PCP.  Her blood work was positive for and obstructive jaundice and she was instructed to present to the ER.  IN August her blood work was reportedly normal, but she did recall having a 3-4 lbs weight loss during that time.  Cumulatively she lost 7 lbs.  In the ER her elevated liver enzymes were confirmed:  AST 188, ALT 229, AP 739 and TB 5.1.  Early last week she did notice a darkening of her urine.  Imaging with the MRI showed a 3.4 mass in the head of the pancreas with diffuse biliary ductal dilation.  The CBD was measured to be 15 mm  Past Medical History:  Diagnosis Date   Cancer [facility])    skin cancer   Hyperlipidemia    Hypertension     Past Surgical History:  Procedure Laterality Date   ABDOMINAL HYSTERECTOMY     COLONOSCOPY     02/21/2002 adenomatous polyp, 03/23/2011   COLONOSCOPY WITH PROPOFOL N/A 11/03/2016   Procedure: COLONOSCOPY WITH PROPOFOL;  Surgeon: Manya Silvas, MD;  Location: [facility] ENDOSCOPY;  Service: Endoscopy;  Laterality: N/A;   HEMORROIDECTOMY     TONSILLECTOMY      Family History  Problem Relation Age of Onset   Breast cancer Neg Hx     Social History:  reports that she has quit smoking. She has never used smokeless tobacco. She reports that she does not drink alcohol and does not use drugs.  Allergies: No Known Allergies  Medications: Scheduled:  heparin  5,000 Units Subcutaneous Q8H   mometasone-formoterol  2 puff Inhalation BID   Continuous:  lactated ringers 1,000 mL with potassium chloride 20 mEq infusion 125 mL/hr  at 03/04/22 0944    Results for orders placed or performed during the hospital encounter of 03/03/22 (from the past 24 hour(s))  Comprehensive metabolic panel     Status: Abnormal   Collection Time: 03/04/22  3:44 AM  Result Value Ref Range   Sodium 136 135 - 145 mmol/L   Potassium 3.1 (L) 3.5 - 5.1 mmol/L   Chloride 100 98 - 111 mmol/L   CO2 24 22 - 32 mmol/L   Glucose, Bld 106 (H) 70 - 99 mg/dL   BUN 48 (H) 8 - 23 mg/dL   Creatinine, Ser 2.31 (H) 0.44 - 1.00 mg/dL   Calcium 8.6 (L) 8.9 - 10.3 mg/dL   Total Protein 5.6 (L) 6.5 - 8.1 g/dL   Albumin 2.3 (L) 3.5 - 5.0 g/dL   AST 135 (H) 15 - 41 U/L   ALT 169 (H) 0 - 44 U/L   Alkaline Phosphatase 588 (H) 38 - 126 U/L   Total Bilirubin 3.9 (H) 0.3 - 1.2 mg/dL   GFR, Estimated 21 (L) >60 mL/min   Anion gap 12 5 - 15  CBC     Status: Abnormal   Collection Time: 03/04/22  3:44 AM  Result Value Ref Range   WBC 8.2 4.0 - 10.5 K/uL   RBC 4.13 3.87 - 5.11 MIL/uL   Hemoglobin 10.9 (L) 12.0 - 15.0 g/dL  HCT 32.8 (L) 36.0 - 46.0 %   MCV 79.4 (L) 80.0 - 100.0 fL   MCH 26.4 26.0 - 34.0 pg   MCHC 33.2 30.0 - 36.0 g/dL   RDW 15.2 11.5 - 15.5 %   Platelets 274 150 - 400 K/uL   nRBC 0.0 0.0 - 0.2 %     MR ABDOMEN MRCP W WO CONTAST  Result Date: 03/03/2022 CLINICAL DATA:  Jaundice. Pancreatitis. Biliary ductal dilatation. Evaluate for choledocholithiasis or pancreatic mass. EXAM: MRI ABDOMEN WITHOUT AND WITH CONTRAST (INCLUDING MRCP) TECHNIQUE: Multiplanar multisequence MR imaging of the abdomen was performed both before and after the administration of intravenous contrast. Heavily T2-weighted images of the biliary and pancreatic ducts were obtained, and three-dimensional MRCP images were rendered by post processing. CONTRAST:  27m GADAVIST GADOBUTROL 1 MMOL/ML IV SOLN COMPARISON:  Noncontrast CT on 03/03/2022 FINDINGS: Lower chest: No acute findings. Hepatobiliary: Image degradation by motion artifact noted. No hepatic masses identified.  Gallbladder is distended, however there are no other findings of acute cholecystitis. Diffuse biliary ductal dilatation is seen, measuring 15 mm in diameter. Stricture of the distal common bile duct is seen in the pancreatic head. No evidence of choledocholithiasis. Pancreas: Image degradation by motion artifact noted. Mild ductal dilatation is seen in the pancreatic tail and body. A heterogeneously enhancing masslike density is seen in the pancreatic head, measuring 3.4 x 2.8 cm, highly suspicious for pancreatic carcinoma. Spleen:  Within normal limits in size and appearance. Adrenals/Urinary Tract: A 2.6 x 2.3 cm left adrenal mass is seen, with evaluation limited by motion artifact. This appears homogeneous with mild T2 hypointensity, favoring a benign adrenal adenoma. No suspicious renal masses identified. No evidence of hydronephrosis. Stomach/Bowel: Unremarkable. Vascular/Lymphatic: No pathologically enlarged lymph nodes identified. No acute vascular findings. Other:  None. Musculoskeletal:  No suspicious bone lesions identified. IMPRESSION: Image degradation by motion artifact noted. 3.4 cm masslike density in the pancreatic head, highly suspicious for pancreatic carcinoma. 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 No evidence of choledocholithiasis. 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 Electronically Signed   By: JMarlaine HindM.D.   On: 03/03/2022 19:05   CT Abdomen Pelvis Wo Contrast  Result Date: 03/03/2022 CLINICAL DATA:  Elevated lipase; distended gallbladder and CBD on ultrasound earlier today; pancreatitis suspected with concern for obstructing mass EXAM: CT ABDOMEN AND PELVIS WITHOUT CONTRAST TECHNIQUE: Multidetector CT imaging of the abdomen and pelvis was performed following the standard protocol without IV contrast. RADIATION DOSE REDUCTION: This exam was performed according to the  departmental dose-optimization program which includes automated exposure control, adjustment of the mA and/or kV according to patient size and/or use of iterative reconstruction technique. COMPARISON:  Ultrasound earlier today FINDINGS: Lower chest: Coronary artery calcification.  No acute abnormality. Hepatobiliary: Distended gallbladder and common bile duct which measures approximately 11 mm in greatest dimension. No radiopaque Stehr. No suspicious hepatic lesion on noncontrast exam. Pancreas: No definite pancreatic duct dilation. There is mild haziness in the peripancreatic fat about the pancreatic head and uncinate process (series 2/image 31-37). Spleen: Normal in size without focal abnormality. Adrenals/Urinary Tract: Nodular low-density (< 10 HU) thickening of the left adrenal gland. Symmetric perinephric stranding. Bilateral focal cortical renal scarring. Low-attenuation lesions in the kidneys are statistically likely to represent cysts. No follow-up is required. Stomach/Bowel: Unremarkable stomach. Normal caliber large and small bowel. The appendix is not visualized. Vascular/Lymphatic: Aortic atherosclerosis. No enlarged abdominal  or pelvic lymph nodes. Reproductive: Hysterectomy. Other: No free intraperitoneal fluid or air. Musculoskeletal: Degenerative changes pubic symphysis, both hips, SI joints and lower lumbar spine. Anterolisthesis L4 on L5. No acute osseous abnormality. IMPRESSION: Hazy stranding about the pancreatic head suggestive of pancreatitis. This is incompletely evaluated without IV contrast. Dilated gallbladder and common bile duct. MRI/MRCP is recommended to evaluate for choledocholithiasis or obstructing mass. Electronically Signed   By: Placido Sou M.D.   On: 03/03/2022 14:33   US Abdomen Limited RUQ (LIVER/GB)  Result Date: 03/03/2022 CLINICAL DATA:  Transaminitis EXAM: ULTRASOUND ABDOMEN LIMITED RIGHT UPPER QUADRANT COMPARISON:  None Available. FINDINGS: Gallbladder: There is  layering sludge in the distended gallbladder lumen but no shadowing stones. There is no gallbladder wall thickening or pericholecystic fluid. Sonographic Murphy's sign was reported negative. Common bile duct: Diameter: 1 cm.  There is intrahepatic biliary ductal dilatation. Liver: No focal lesion identified. Within normal limits in parenchymal echogenicity. Portal vein is patent on color Doppler imaging with normal direction of blood flow towards the liver. Other: None. IMPRESSION: 1. Distended gallbladder with layering sludge but no shadowing stones or other evidence of acute cholecystitis. 2. Intra and extrahepatic biliary ductal dilatation. This may be due to choledocholithiasis or an obstructing mass. Recommend MRI/MRCP for further evaluation. Electronically Signed   By: Valetta Mole M.D.   On: 03/03/2022 13:22    ROS:  As stated above in the HPI otherwise negative.  Blood pressure 132/72, pulse 77, temperature 97.7 F (36.5 C), temperature source Oral, resp. rate 18, SpO2 96 %.    PE: Gen: NAD, Alert and Oriented HEENT:  [place]/AT, EOMI Neck: Supple, no LAD Lungs: CTA Bilaterally CV: RRR without M/G/R ABD: Soft, NTND, +BS Ext: No C/C/E  Assessment/Plan: 1) Pancreatic head mass. 2) Obstructive jaundice. 3) Weight loss.   She will undergo an ERCP with stent placement tomorrow.  Brushings will be obtained.  If the procedure is not successful, it may be converted to an EUS with FNA.  Vennela Jutte D 03/04/2022, 11:49 AM

## 2022-03-04 NOTE — Assessment & Plan Note (Addendum)
Blood pressure elevated - Hold valsartan due to renal failure - Resume HCTZ - Continue hydralazine as needed for severe range pressures - May need amlodipine at d/c

## 2022-03-04 NOTE — H&P (View-Only) (Signed)
Reason for Consult: Pancreatic head mass and obstructive jaundice Referring Physician: [facility]  Syniyah H Ainley HPI: This is an 81 year old female with a PMH of hyperlipidemia, HTN, and a prior history of tobacco abuse (x 20 years) admitted with obstructive jaundice.  For the past couple of weeks the patient was feeling fatigued and now well.  She noticed a worsening of weight loss and she sought further evaluation from her PCP.  Her blood work was positive for and obstructive jaundice and she was instructed to present to the ER.  IN August her blood work was reportedly normal, but she did recall having a 3-4 lbs weight loss during that time.  Cumulatively she lost 7 lbs.  In the ER her elevated liver enzymes were confirmed:  AST 188, ALT 229, AP 739 and TB 5.1.  Early last week she did notice a darkening of her urine.  Imaging with the MRI showed a 3.4 mass in the head of the pancreas with diffuse biliary ductal dilation.  The CBD was measured to be 15 mm  Past Medical History:  Diagnosis Date   Cancer [facility])    skin cancer   Hyperlipidemia    Hypertension     Past Surgical History:  Procedure Laterality Date   ABDOMINAL HYSTERECTOMY     COLONOSCOPY     02/21/2002 adenomatous polyp, 03/23/2011   COLONOSCOPY WITH PROPOFOL N/A 11/03/2016   Procedure: COLONOSCOPY WITH PROPOFOL;  Surgeon: Manya Silvas, MD;  Location: [facility] ENDOSCOPY;  Service: Endoscopy;  Laterality: N/A;   HEMORROIDECTOMY     TONSILLECTOMY      Family History  Problem Relation Age of Onset   Breast cancer Neg Hx     Social History:  reports that she has quit smoking. She has never used smokeless tobacco. She reports that she does not drink alcohol and does not use drugs.  Allergies: No Known Allergies  Medications: Scheduled:  heparin  5,000 Units Subcutaneous Q8H   mometasone-formoterol  2 puff Inhalation BID   Continuous:  lactated ringers 1,000 mL with potassium chloride 20 mEq infusion 125 mL/hr  at 03/04/22 0944    Results for orders placed or performed during the hospital encounter of 03/03/22 (from the past 24 hour(s))  Comprehensive metabolic panel     Status: Abnormal   Collection Time: 03/04/22  3:44 AM  Result Value Ref Range   Sodium 136 135 - 145 mmol/L   Potassium 3.1 (L) 3.5 - 5.1 mmol/L   Chloride 100 98 - 111 mmol/L   CO2 24 22 - 32 mmol/L   Glucose, Bld 106 (H) 70 - 99 mg/dL   BUN 48 (H) 8 - 23 mg/dL   Creatinine, Ser 2.31 (H) 0.44 - 1.00 mg/dL   Calcium 8.6 (L) 8.9 - 10.3 mg/dL   Total Protein 5.6 (L) 6.5 - 8.1 g/dL   Albumin 2.3 (L) 3.5 - 5.0 g/dL   AST 135 (H) 15 - 41 U/L   ALT 169 (H) 0 - 44 U/L   Alkaline Phosphatase 588 (H) 38 - 126 U/L   Total Bilirubin 3.9 (H) 0.3 - 1.2 mg/dL   GFR, Estimated 21 (L) >60 mL/min   Anion gap 12 5 - 15  CBC     Status: Abnormal   Collection Time: 03/04/22  3:44 AM  Result Value Ref Range   WBC 8.2 4.0 - 10.5 K/uL   RBC 4.13 3.87 - 5.11 MIL/uL   Hemoglobin 10.9 (L) 12.0 - 15.0 g/dL  HCT 32.8 (L) 36.0 - 46.0 %   MCV 79.4 (L) 80.0 - 100.0 fL   MCH 26.4 26.0 - 34.0 pg   MCHC 33.2 30.0 - 36.0 g/dL   RDW 15.2 11.5 - 15.5 %   Platelets 274 150 - 400 K/uL   nRBC 0.0 0.0 - 0.2 %     MR ABDOMEN MRCP W WO CONTAST  Result Date: 03/03/2022 CLINICAL DATA:  Jaundice. Pancreatitis. Biliary ductal dilatation. Evaluate for choledocholithiasis or pancreatic mass. EXAM: MRI ABDOMEN WITHOUT AND WITH CONTRAST (INCLUDING MRCP) TECHNIQUE: Multiplanar multisequence MR imaging of the abdomen was performed both before and after the administration of intravenous contrast. Heavily T2-weighted images of the biliary and pancreatic ducts were obtained, and three-dimensional MRCP images were rendered by post processing. CONTRAST:  66m GADAVIST GADOBUTROL 1 MMOL/ML IV SOLN COMPARISON:  Noncontrast CT on 03/03/2022 FINDINGS: Lower chest: No acute findings. Hepatobiliary: Image degradation by motion artifact noted. No hepatic masses identified.  Gallbladder is distended, however there are no other findings of acute cholecystitis. Diffuse biliary ductal dilatation is seen, measuring 15 mm in diameter. Stricture of the distal common bile duct is seen in the pancreatic head. No evidence of choledocholithiasis. Pancreas: Image degradation by motion artifact noted. Mild ductal dilatation is seen in the pancreatic tail and body. A heterogeneously enhancing masslike density is seen in the pancreatic head, measuring 3.4 x 2.8 cm, highly suspicious for pancreatic carcinoma. Spleen:  Within normal limits in size and appearance. Adrenals/Urinary Tract: A 2.6 x 2.3 cm left adrenal mass is seen, with evaluation limited by motion artifact. This appears homogeneous with mild T2 hypointensity, favoring a benign adrenal adenoma. No suspicious renal masses identified. No evidence of hydronephrosis. Stomach/Bowel: Unremarkable. Vascular/Lymphatic: No pathologically enlarged lymph nodes identified. No acute vascular findings. Other:  None. Musculoskeletal:  No suspicious bone lesions identified. IMPRESSION: Image degradation by motion artifact noted. 3.4 cm masslike density in the pancreatic head, highly suspicious for pancreatic carcinoma. 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 No evidence of choledocholithiasis. 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 Electronically Signed   By: JMarlaine HindM.D.   On: 03/03/2022 19:05   CT Abdomen Pelvis Wo Contrast  Result Date: 03/03/2022 CLINICAL DATA:  Elevated lipase; distended gallbladder and CBD on ultrasound earlier today; pancreatitis suspected with concern for obstructing mass EXAM: CT ABDOMEN AND PELVIS WITHOUT CONTRAST TECHNIQUE: Multidetector CT imaging of the abdomen and pelvis was performed following the standard protocol without IV contrast. RADIATION DOSE REDUCTION: This exam was performed according to the  departmental dose-optimization program which includes automated exposure control, adjustment of the mA and/or kV according to patient size and/or use of iterative reconstruction technique. COMPARISON:  Ultrasound earlier today FINDINGS: Lower chest: Coronary artery calcification.  No acute abnormality. Hepatobiliary: Distended gallbladder and common bile duct which measures approximately 11 mm in greatest dimension. No radiopaque Joo. No suspicious hepatic lesion on noncontrast exam. Pancreas: No definite pancreatic duct dilation. There is mild haziness in the peripancreatic fat about the pancreatic head and uncinate process (series 2/image 31-37). Spleen: Normal in size without focal abnormality. Adrenals/Urinary Tract: Nodular low-density (< 10 HU) thickening of the left adrenal gland. Symmetric perinephric stranding. Bilateral focal cortical renal scarring. Low-attenuation lesions in the kidneys are statistically likely to represent cysts. No follow-up is required. Stomach/Bowel: Unremarkable stomach. Normal caliber large and small bowel. The appendix is not visualized. Vascular/Lymphatic: Aortic atherosclerosis. No enlarged abdominal  or pelvic lymph nodes. Reproductive: Hysterectomy. Other: No free intraperitoneal fluid or air. Musculoskeletal: Degenerative changes pubic symphysis, both hips, SI joints and lower lumbar spine. Anterolisthesis L4 on L5. No acute osseous abnormality. IMPRESSION: Hazy stranding about the pancreatic head suggestive of pancreatitis. This is incompletely evaluated without IV contrast. Dilated gallbladder and common bile duct. MRI/MRCP is recommended to evaluate for choledocholithiasis or obstructing mass. Electronically Signed   By: Placido Sou M.D.   On: 03/03/2022 14:33   US Abdomen Limited RUQ (LIVER/GB)  Result Date: 03/03/2022 CLINICAL DATA:  Transaminitis EXAM: ULTRASOUND ABDOMEN LIMITED RIGHT UPPER QUADRANT COMPARISON:  None Available. FINDINGS: Gallbladder: There is  layering sludge in the distended gallbladder lumen but no shadowing stones. There is no gallbladder wall thickening or pericholecystic fluid. Sonographic Murphy's sign was reported negative. Common bile duct: Diameter: 1 cm.  There is intrahepatic biliary ductal dilatation. Liver: No focal lesion identified. Within normal limits in parenchymal echogenicity. Portal vein is patent on color Doppler imaging with normal direction of blood flow towards the liver. Other: None. IMPRESSION: 1. Distended gallbladder with layering sludge but no shadowing stones or other evidence of acute cholecystitis. 2. Intra and extrahepatic biliary ductal dilatation. This may be due to choledocholithiasis or an obstructing mass. Recommend MRI/MRCP for further evaluation. Electronically Signed   By: Valetta Mole M.D.   On: 03/03/2022 13:22    ROS:  As stated above in the HPI otherwise negative.  Blood pressure 132/72, pulse 77, temperature 97.7 F (36.5 C), temperature source Oral, resp. rate 18, SpO2 96 %.    PE: Gen: NAD, Alert and Oriented HEENT:  [place]/AT, EOMI Neck: Supple, no LAD Lungs: CTA Bilaterally CV: RRR without M/G/R ABD: Soft, NTND, +BS Ext: No C/C/E  Assessment/Plan: 1) Pancreatic head mass. 2) Obstructive jaundice. 3) Weight loss.   She will undergo an ERCP with stent placement tomorrow.  Brushings will be obtained.  If the procedure is not successful, it may be converted to an EUS with FNA.  Danilyn Cocke D 03/04/2022, 11:49 AM

## 2022-03-04 NOTE — Assessment & Plan Note (Signed)
Baseline unknown, but per patient, reportedly normal.  Here creatinine 2.46 on admission, improved to 2.3 overnight with fluids - Hold ARB, diuretic - Continue IV fluids - Check renal ultrasound, urine electrolytes, urine protein, SPEP

## 2022-03-04 NOTE — Assessment & Plan Note (Addendum)
S/p ERCP with sphincterotomy and biliary stent placement and brush biopsy Biopsy results not back yet. - If biopsy results not back at discharge, patient will follow up with [facility] for biopsy results              - If biopsy inconclusive, will undergo outpatient EUS              - If biopsy malignant, [facility] will send referral to [facility]  - If Cr better tomorrow, will need CT chest with contrast prior to discharge as part of staging

## 2022-03-05 ENCOUNTER — Inpatient Hospital Stay (HOSPITAL_COMMUNITY): Payer: PPO | Admitting: Anesthesiology

## 2022-03-05 ENCOUNTER — Encounter (HOSPITAL_COMMUNITY): Payer: Self-pay | Admitting: Family Medicine

## 2022-03-05 ENCOUNTER — Inpatient Hospital Stay (HOSPITAL_COMMUNITY): Payer: PPO

## 2022-03-05 ENCOUNTER — Encounter (HOSPITAL_COMMUNITY)
Admission: AD | Disposition: A | Discharge status: Rehabilitation Facility | Payer: Self-pay | Source: Other Acute Inpatient Hospital | Attending: Internal Medicine

## 2022-03-05 DIAGNOSIS — K831 Obstruction of bile duct: Secondary | ICD-10-CM

## 2022-03-05 DIAGNOSIS — N289 Disorder of kidney and ureter, unspecified: Secondary | ICD-10-CM

## 2022-03-05 DIAGNOSIS — E785 Hyperlipidemia, unspecified: Secondary | ICD-10-CM | POA: Diagnosis not present

## 2022-03-05 DIAGNOSIS — N179 Acute kidney failure, unspecified: Secondary | ICD-10-CM | POA: Diagnosis not present

## 2022-03-05 DIAGNOSIS — Z87891 Personal history of nicotine dependence: Secondary | ICD-10-CM | POA: Diagnosis not present

## 2022-03-05 DIAGNOSIS — I10 Essential (primary) hypertension: Secondary | ICD-10-CM | POA: Diagnosis not present

## 2022-03-05 HISTORY — PX: ERCP: SHX5425

## 2022-03-05 HISTORY — PX: BILIARY STENT PLACEMENT: SHX5538

## 2022-03-05 HISTORY — PX: BILIARY BRUSHING: SHX6843

## 2022-03-05 LAB — COMPREHENSIVE METABOLIC PANEL
ALT: 154 U/L — ABNORMAL HIGH (ref 0–44)
AST: 127 U/L — ABNORMAL HIGH (ref 15–41)
Albumin: 2.2 g/dL — ABNORMAL LOW (ref 3.5–5.0)
Alkaline Phosphatase: 615 U/L — ABNORMAL HIGH (ref 38–126)
Anion gap: 13 (ref 5–15)
BUN: 31 mg/dL — ABNORMAL HIGH (ref 8–23)
CO2: 23 mmol/L (ref 22–32)
Calcium: 9.6 mg/dL (ref 8.9–10.3)
Chloride: 102 mmol/L (ref 98–111)
Creatinine, Ser: 1.81 mg/dL — ABNORMAL HIGH (ref 0.44–1.00)
GFR, Estimated: 28 mL/min — ABNORMAL LOW (ref >60)
Glucose, Bld: 95 mg/dL (ref 70–99)
Potassium: 3.7 mmol/L (ref 3.5–5.1)
Sodium: 138 mmol/L (ref 135–145)
Total Bilirubin: 3.9 mg/dL — ABNORMAL HIGH (ref 0.3–1.2)
Total Protein: 5.5 g/dL — ABNORMAL LOW (ref 6.5–8.1)

## 2022-03-05 SURGERY — ERCP, WITH INTERVENTION IF INDICATED
Anesthesia: General

## 2022-03-05 MED ORDER — ENOXAPARIN SODIUM 30 MG/0.3ML IJ SOSY
30.0000 mg | PREFILLED_SYRINGE | INTRAMUSCULAR | Status: DC
  Administered 2022-03-06 – 2022-03-11 (×7): 30 mg via SUBCUTANEOUS
  Filled 2022-03-05 (×7): qty 0.3

## 2022-03-05 MED ORDER — DEXAMETHASONE SODIUM PHOSPHATE 10 MG/ML IJ SOLN
INTRAMUSCULAR | Status: DC | PRN
  Administered 2022-03-05: 10 mg via INTRAVENOUS

## 2022-03-05 MED ORDER — CIPROFLOXACIN IN D5W 400 MG/200ML IV SOLN
INTRAVENOUS | Status: DC | PRN
  Administered 2022-03-05: 400 mg via INTRAVENOUS

## 2022-03-05 MED ORDER — ONDANSETRON HCL 4 MG/2ML IJ SOLN
INTRAMUSCULAR | Status: DC | PRN
  Administered 2022-03-05: 4 mg via INTRAVENOUS

## 2022-03-05 MED ORDER — SUCCINYLCHOLINE 20MG/ML (10ML) SYRINGE FOR MEDFUSION PUMP - OPTIME
INTRAMUSCULAR | Status: DC | PRN
  Administered 2022-03-05: 100 mg via INTRAVENOUS

## 2022-03-05 MED ORDER — LIDOCAINE 2% (20 MG/ML) 5 ML SYRINGE
INTRAMUSCULAR | Status: DC | PRN
  Administered 2022-03-05: 50 mg via INTRAVENOUS
  Administered 2022-03-05: 100 mg via INTRAVENOUS

## 2022-03-05 MED ORDER — ROCURONIUM BROMIDE 10 MG/ML (PF) SYRINGE
PREFILLED_SYRINGE | INTRAVENOUS | Status: DC | PRN
  Administered 2022-03-05: 40 mg via INTRAVENOUS

## 2022-03-05 MED ORDER — FENTANYL CITRATE (PF) 250 MCG/5ML IJ SOLN
INTRAMUSCULAR | Status: DC | PRN
  Administered 2022-03-05 (×2): 50 ug via INTRAVENOUS

## 2022-03-05 MED ORDER — CIPROFLOXACIN IN D5W 400 MG/200ML IV SOLN
INTRAVENOUS | Status: AC
  Filled 2022-03-05: qty 200

## 2022-03-05 MED ORDER — SODIUM CHLORIDE 0.9 % IV SOLN
INTRAVENOUS | Status: DC | PRN
  Administered 2022-03-05: 15 mL

## 2022-03-05 MED ORDER — DICLOFENAC SUPPOSITORY 100 MG
RECTAL | Status: DC | PRN
  Administered 2022-03-05: 100 mg via RECTAL

## 2022-03-05 MED ORDER — SODIUM CHLORIDE 0.9 % IV SOLN: INTRAVENOUS | Status: DC

## 2022-03-05 MED ORDER — DICLOFENAC SUPPOSITORY 100 MG
RECTAL | Status: AC
  Filled 2022-03-05: qty 1

## 2022-03-05 MED ORDER — FENTANYL CITRATE (PF) 100 MCG/2ML IJ SOLN
INTRAMUSCULAR | Status: AC
  Filled 2022-03-05: qty 2

## 2022-03-05 MED ORDER — SUGAMMADEX SODIUM 200 MG/2ML IV SOLN
INTRAVENOUS | Status: DC | PRN
  Administered 2022-03-05: 275 mg via INTRAVENOUS

## 2022-03-05 MED ORDER — INDOMETHACIN 50 MG RE SUPP
RECTAL | Status: AC
  Filled 2022-03-05: qty 2

## 2022-03-05 MED ORDER — PROPOFOL 10 MG/ML IV BOLUS
INTRAVENOUS | Status: DC | PRN
  Administered 2022-03-05: 50 mg via INTRAVENOUS
  Administered 2022-03-05: 30 mg via INTRAVENOUS
  Administered 2022-03-05: 100 mg via INTRAVENOUS

## 2022-03-05 MED ORDER — PHENYLEPHRINE HCL-NACL 20-0.9 MG/250ML-% IV SOLN
INTRAVENOUS | Status: DC | PRN
  Administered 2022-03-05: 25 ug/min via INTRAVENOUS

## 2022-03-05 MED ORDER — GLUCAGON HCL RDNA (DIAGNOSTIC) 1 MG IJ SOLR
INTRAMUSCULAR | Status: AC
  Filled 2022-03-05: qty 1

## 2022-03-05 NOTE — Anesthesia Procedure Notes (Addendum)
Procedure Name: Intubation Date/Time: 03/05/2022 7:51 AM  Performed by: Rande Brunt, CRNAPre-anesthesia Checklist: Patient identified, Emergency Drugs available, Suction available and Patient being monitored Patient Re-evaluated:Patient Re-evaluated prior to induction Oxygen Delivery Method: Circle System Utilized Preoxygenation: Pre-oxygenation with 100% oxygen Induction Type: IV induction, Rapid sequence and Cricoid Pressure applied Ventilation: Mask ventilation without difficulty Laryngoscope Size: Glidescope and 3 Grade View: Grade I Tube type: Oral Tube size: 7.0 mm Number of attempts: 2 Airway Equipment and Method: Stylet and Oral airway Placement Confirmation: ETT inserted through vocal cords under direct vision, positive ETCO2 and breath sounds checked- equal and bilateral Secured at: 22 cm Tube secured with: Tape Dental Injury: Teeth and Oropharynx as per pre-operative assessment  Difficulty Due To: Difficulty was unanticipated and Difficult Airway- due to anterior larynx Future Recommendations: Recommend- induction with short-acting agent, and alternative techniques readily available Comments: Attempted with miller 2 with grade 3 view, Glidescope 3 blade with grade 1 view. Anterior larynx, easy to mask.

## 2022-03-05 NOTE — Op Note (Signed)
[facility] Patient Name: Shirley Nichols Procedure Date : 03/05/2022 MRN: [id number] Attending MD: Carol Ada , MD Date of Birth: [birth year] CSN: [id number] Age: 81 Admit Type: Inpatient Procedure:                ERCP Indications:              Malignant stricture of the common bile duct Providers:                Carol Ada, MD, Grace Isaac, RN, Darliss Cheney,                            Technician Referring MD:              Medicines:                General Anesthesia Complications:            No immediate complications. Estimated Blood Loss:     Estimated blood loss: none. Estimated blood loss:                            none. Procedure:                Pre-Anesthesia Assessment:                           - Prior to the procedure, a History and Physical                            was performed, and patient medications and                            allergies were reviewed. The patient's tolerance of                            previous anesthesia was also reviewed. The risks                            and benefits of the procedure and the sedation                            options and risks were discussed with the patient.                            All questions were answered, and informed consent                            was obtained. Prior Anticoagulants: The patient has                            taken heparin, last dose was day of procedure. ASA                            Grade Assessment: III - A patient with severe  systemic disease. After reviewing the risks and                            benefits, the patient was deemed in satisfactory                            condition to undergo the procedure.                           - Sedation was administered by an anesthesia                            professional. General anesthesia was attained.                           After obtaining informed consent, the scope was                             passed under direct vision. Throughout the                            procedure, the patient's blood pressure, pulse, and                            oxygen saturations were monitored continuously. The                            TFJ-Q190V ([id number]) Olympus duodenoscope was                            introduced through the mouth, and used to inject                            contrast into and used to inject contrast into the                            bile duct and ventral pancreatic duct. The ERCP was                            somewhat difficult. The patient tolerated the                            procedure well. Scope In: Scope Out: Findings:      The major papilla was normal. The bile duct was deeply cannulated with       the short-nosed traction sphincterotome. Contrast was injected. I       personally interpreted the bile duct images. There was brisk flow of       contrast through the ducts. Image quality was adequate. Contrast       extended to the main bile duct. The middle third of the main bile duct       and upper third of the main bile duct were diffusely dilated, with a       mass causing an obstruction. The largest diameter was 12 mm. Revolution  wire was passed into the biliary tree. A 15 mm biliary sphincterotomy       was made with a traction (standard) sphincterotome using ERBE       electrocautery. There was no post-sphincterotomy bleeding. One 10 mm by       4 cm covered metal stent was placed 4.5 cm into the common bile duct.       Bile flowed through the stent. The stent was in good position. Cells for       cytology were obtained by brushing in the lower third of the main bile       duct.      The ampulla was normal in gross appearance. During the first cannulation       attempt the guidewire went into the PD. A two wire technique was used,       but the PD wire fell out of position. Several other attempts with the       Jagwire alone was unsuccessful. A  second two wire technique was used       again when the PD was cannulated. This time with a sphincterotomy and       using the Revolution wire the guidewire selectively cannulated the CBD.       Contrast injection revealed a dilated proximal CBD measuring 12 mm. A 4       cm distal CBD stricture was found. Two brushings were used to sample the       distal CBD. A 10 mm x 40 mm covered metallic stent was placed without       difficulty. Excellent positioning was achieved and a copious amount of       dark thick bile drained. Fluoroscopic images were obtained. Impression:               - The major papilla appeared normal.                           - The upper third of the main bile duct and middle                            third of the main bile duct were dilated, with a                            mass causing an obstruction.                           - A biliary sphincterotomy was performed.                           - One covered metal stent was placed into the                            common bile duct.                           - Cells for cytology obtained in the lower third of                            the main duct. Recommendation:           -  Return patient to hospital ward for ongoing care.                           - Resume regular diet.                           - Await cytology results. Procedure Code(s):        --- Professional ---                           [phone], Endoscopic retrograde                            cholangiopancreatography (ERCP); with placement of                            endoscopic stent into biliary or pancreatic duct,                            including pre- and post-dilation and guide wire                            passage, when performed, including sphincterotomy,                            when performed, each stent                           03009, Endoscopic catheterization of the biliary                            ductal system, radiological  supervision and                            interpretation Diagnosis Code(s):        --- Professional ---                           K83.1, Obstruction of bile duct CPT copyright 2019 American Medical Association. All rights reserved. The codes documented in this report are preliminary and upon coder review may  be revised to meet current compliance requirements. Carol Ada, MD Carol Ada, MD 03/05/2022 10:20:45 AM This report has been signed electronically. Number of Addenda: 0

## 2022-03-05 NOTE — Anesthesia Preprocedure Evaluation (Signed)
Anesthesia Evaluation  Patient identified by MRN, date of birth, ID band Patient awake    Reviewed: Allergy & Precautions, NPO status , Patient's Chart, lab work & pertinent test results  Airway Mallampati: II       Dental   Pulmonary former smoker,    breath sounds clear to auscultation       Cardiovascular hypertension,  Rhythm:Regular Rate:Normal     Neuro/Psych    GI/Hepatic Neg liver ROS, Hx noted Dr. Nyoka Cowden   Endo/Other  negative endocrine ROS  Renal/GU Renal disease     Musculoskeletal   Abdominal   Peds  Hematology   Anesthesia Other Findings   Reproductive/Obstetrics                             Anesthesia Physical Anesthesia Plan  ASA: 3  Anesthesia Plan: General   Post-op Pain Management:    Induction: Intravenous  PONV Risk Score and Plan: 3  Airway Management Planned: Oral ETT  Additional Equipment:   Intra-op Plan:   Post-operative Plan: Extubation in OR  Informed Consent: I have reviewed the patients History and Physical, chart, labs and discussed the procedure including the risks, benefits and alternatives for the proposed anesthesia with the patient or authorized representative who has indicated his/her understanding and acceptance.     Dental advisory given  Plan Discussed with: CRNA and Anesthesiologist  Anesthesia Plan Comments:         Anesthesia Quick Evaluation

## 2022-03-05 NOTE — Progress Notes (Signed)
Mobility Specialist Progress Note:   03/05/22 1624  Mobility  Activity Ambulated independently in hallway  Activity Response Tolerated well  Distance Ambulated (ft) 400 ft  $Mobility charge 1 Mobility  Level of Assistance Modified independent, requires aide device or extra time  Assistive Device Other (Comment) (IV Pole)  Mobility Referral Yes   Pt received in bed and agreeable. No complaints. Pt left in bed with all needs met, call bell in reach, and family in room.   Lavern Crimi Mobility Specialist-Acute Rehab Secure Chat only

## 2022-03-05 NOTE — Transfer of Care (Signed)
Immediate Anesthesia Transfer of Care Note  Patient: Shirley Nichols  Procedure(s) Performed: ENDOSCOPIC RETROGRADE CHOLANGIOPANCREATOGRAPHY (ERCP) BIOPSY BILIARY STENT PLACEMENT  Patient Location: PACU  Anesthesia Type:General  Level of Consciousness: awake, alert  and oriented  Airway & Oxygen Therapy: Patient Spontanous Breathing  Post-op Assessment: Report given to RN, Post -op Vital signs reviewed and stable and Patient moving all extremities X 4  Post vital signs: Reviewed and stable  Last Vitals:  Vitals Value Taken Time  BP 147/59 03/05/22 0914  Temp 36.3 C 03/05/22 0914  Pulse 66 03/05/22 0915  Resp 16 03/05/22 0915  SpO2 99 % 03/05/22 0915  Vitals shown include unvalidated device data.  Last Pain:  Vitals:   03/05/22 0914  TempSrc: Temporal  PainSc:          Complications:  Encounter Notable Events  Notable Event Outcome Phase Comment  Difficult to intubate - unexpected  Intraprocedure Filed from anesthesia note documentation.

## 2022-03-05 NOTE — Interval H&P Note (Signed)
History and Physical Interval Note:  03/05/2022 7:30 AM  Shirley Nichols  has presented today for surgery, with the diagnosis of Obstructive jaundice.  The various methods of treatment have been discussed with the patient and family. After consideration of risks, benefits and other options for treatment, the patient has consented to  Procedure(s): ENDOSCOPIC RETROGRADE CHOLANGIOPANCREATOGRAPHY (ERCP) (N/A) as a surgical intervention.  The patient's history has been reviewed, patient examined, no change in status, stable for surgery.  I have reviewed the patient's chart and labs.  Questions were answered to the patient's satisfaction.     [name] D

## 2022-03-05 NOTE — Anesthesia Postprocedure Evaluation (Signed)
Anesthesia Post Note  Patient: Shirley Nichols  Procedure(s) Performed: ENDOSCOPIC RETROGRADE CHOLANGIOPANCREATOGRAPHY (ERCP) BIOPSY BILIARY STENT PLACEMENT     Patient location during evaluation: PACU Anesthesia Type: General Level of consciousness: awake Pain management: pain level controlled Respiratory status: spontaneous breathing Cardiovascular status: stable Postop Assessment: no apparent nausea or vomiting Anesthetic complications: yes   Encounter Notable Events  Notable Event Outcome Phase Comment  Difficult to intubate - unexpected  Intraprocedure Filed from anesthesia note documentation.    Last Vitals:  Vitals:   03/05/22 0914 03/05/22 0920  BP: (!) 147/59 (!) 136/56  Pulse: 63 67  Resp: 17 16  Temp: (!) 36.3 C   SpO2: 99% 95%    Last Pain:  Vitals:   03/05/22 0914  TempSrc: Temporal  PainSc:                  [name]

## 2022-03-05 NOTE — Progress Notes (Signed)
  Progress Note   Patient: Shirley Nichols ZOX:[id number] DOB: [birth year] DOA: 03/03/2022     2 DOS: the patient was seen and examined on 03/05/2022 at 3:20 PM      Brief hospital course: Shirley Nichols is an 81 y.o. F with HTN, HLD who presented with hyperbilirubinemia and renal failure.  Developed generalized weakness in the last few weeks, went to PCP, found to have jaundice, elevated LFTs and renal failure, sent to the ER.   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  Here creatinine 2.46 on admission, 1.8 today.  Fena >2% actually, P/c ratio 0.3, renal US unremarkable - Hold ARB, diuretic - Continue IV fluids - Follow SPEP - Trend SCr  Hypertension Blood pressure normal - Hold valsartan, HCTZ due to renal failure           Subjective: Patient is doing well with no pain postop.  No fever, confusion, vomiting.     Physical Exam: BP (!) 148/61 (BP Location: Left Arm)   Pulse 64   Temp 98.1 F (36.7 C) (Oral)   Resp 16   SpO2 98%   Thin elderly female, mild jaundice, no acute distress, sitting up RRR, no murmurs, no peripheral edema Respiratory rate normal, lungs clear without rales or wheezes Abdomen soft no tenderness palpation or guarding, no ascites or distention Attention normal, affect appropriate, judgment and insight appear normal    Data Reviewed: Gust with gastroenterology Comprehensive metabolic panel shows LFTs no change, creatinine down to 1.8,  potassium normalized Renal ultrasound unremarkable Fina 2.7% Protein creatinine ratio microalbuminuric  Family Communication: Husband at the bedside    Disposition: Status is: Inpatient Patient presented with obstructive jaundice, renal failure  She is now had an ERCP with stenting, and creatinine is improving on fluids  We will observe overnight, if LFTs stable, she tolerates diet well, and renal function continues to improve we will likely discharge tomorrow          Author: Edwin Dada, MD 03/05/2022 4:36 PM  For on call review [email or web address].

## 2022-03-06 DIAGNOSIS — K859 Acute pancreatitis without necrosis or infection, unspecified: Secondary | ICD-10-CM

## 2022-03-06 DIAGNOSIS — K9189 Other postprocedural complications and disorders of digestive system: Secondary | ICD-10-CM

## 2022-03-06 DIAGNOSIS — N179 Acute kidney failure, unspecified: Secondary | ICD-10-CM | POA: Diagnosis not present

## 2022-03-06 DIAGNOSIS — K8689 Other specified diseases of pancreas: Secondary | ICD-10-CM

## 2022-03-06 DIAGNOSIS — C801 Malignant (primary) neoplasm, unspecified: Secondary | ICD-10-CM | POA: Diagnosis not present

## 2022-03-06 DIAGNOSIS — I10 Essential (primary) hypertension: Secondary | ICD-10-CM | POA: Diagnosis not present

## 2022-03-06 DIAGNOSIS — K831 Obstruction of bile duct: Secondary | ICD-10-CM | POA: Diagnosis not present

## 2022-03-06 DIAGNOSIS — E785 Hyperlipidemia, unspecified: Secondary | ICD-10-CM | POA: Diagnosis not present

## 2022-03-06 LAB — COMPREHENSIVE METABOLIC PANEL
ALT: 140 U/L — ABNORMAL HIGH (ref 0–44)
AST: 92 U/L — ABNORMAL HIGH (ref 15–41)
Albumin: 2.1 g/dL — ABNORMAL LOW (ref 3.5–5.0)
Alkaline Phosphatase: 529 U/L — ABNORMAL HIGH (ref 38–126)
Anion gap: 6 (ref 5–15)
BUN: 43 mg/dL — ABNORMAL HIGH (ref 8–23)
CO2: 23 mmol/L (ref 22–32)
Calcium: 8.3 mg/dL — ABNORMAL LOW (ref 8.9–10.3)
Chloride: 107 mmol/L (ref 98–111)
Creatinine, Ser: 2.22 mg/dL — ABNORMAL HIGH (ref 0.44–1.00)
GFR, Estimated: 22 mL/min — ABNORMAL LOW (ref >60)
Glucose, Bld: 136 mg/dL — ABNORMAL HIGH (ref 70–99)
Potassium: 4.3 mmol/L (ref 3.5–5.1)
Sodium: 136 mmol/L (ref 135–145)
Total Bilirubin: 1.7 mg/dL — ABNORMAL HIGH (ref 0.3–1.2)
Total Protein: 5.4 g/dL — ABNORMAL LOW (ref 6.5–8.1)

## 2022-03-06 LAB — CBC
HCT: 32.1 % — ABNORMAL LOW (ref 36.0–46.0)
Hemoglobin: 10.4 g/dL — ABNORMAL LOW (ref 12.0–15.0)
MCH: 26.3 pg (ref 26.0–34.0)
MCHC: 32.4 g/dL (ref 30.0–36.0)
MCV: 81.1 fL (ref 80.0–100.0)
Platelets: 244 10*3/uL (ref 150–400)
RBC: 3.96 MIL/uL (ref 3.87–5.11)
RDW: 15.7 % — ABNORMAL HIGH (ref 11.5–15.5)
WBC: 8.9 10*3/uL (ref 4.0–10.5)
nRBC: 0 % (ref 0.0–0.2)

## 2022-03-06 LAB — KAPPA/LAMBDA LIGHT CHAINS
Kappa free light chain: 26.7 mg/L — ABNORMAL HIGH (ref 3.3–19.4)
Kappa, lambda light chain ratio: 1.53 (ref 0.26–1.65)
Lambda free light chains: 17.4 mg/L (ref 5.7–26.3)

## 2022-03-06 LAB — LIPASE, BLOOD: Lipase: 669 U/L — ABNORMAL HIGH (ref 11–51)

## 2022-03-06 MED ORDER — HYDROCODONE-ACETAMINOPHEN 5-325 MG PO TABS
1.0000 | ORAL_TABLET | Freq: Once | ORAL | Status: AC
  Administered 2022-03-06: 1 via ORAL
  Filled 2022-03-06: qty 1

## 2022-03-06 MED ORDER — LACTATED RINGERS IV SOLN: INTRAVENOUS | Status: DC

## 2022-03-06 MED ORDER — HYDROCODONE-ACETAMINOPHEN 7.5-325 MG PO TABS
1.0000 | ORAL_TABLET | ORAL | Status: DC | PRN
  Administered 2022-03-06: 1 via ORAL
  Filled 2022-03-06: qty 1

## 2022-03-06 MED ORDER — HYDROCODONE-ACETAMINOPHEN 5-325 MG PO TABS
1.0000 | ORAL_TABLET | ORAL | Status: DC | PRN
  Administered 2022-03-06: 1 via ORAL
  Filled 2022-03-06: qty 1

## 2022-03-06 MED ORDER — ACETAMINOPHEN 325 MG PO TABS
650.0000 mg | ORAL_TABLET | Freq: Four times a day (QID) | ORAL | Status: DC | PRN
  Administered 2022-03-06 – 2022-03-14 (×3): 650 mg via ORAL
  Filled 2022-03-06 (×3): qty 2

## 2022-03-06 MED ORDER — DOCUSATE SODIUM 100 MG PO CAPS
100.0000 mg | ORAL_CAPSULE | Freq: Two times a day (BID) | ORAL | Status: DC
  Administered 2022-03-06 – 2022-03-09 (×5): 100 mg via ORAL
  Filled 2022-03-06 (×7): qty 1

## 2022-03-06 NOTE — Care Management Important Message (Signed)
Important Message  Patient Details  Name: Shirley Nichols MRN: [id number] Date of Birth: 1941-05-27   Medicare Important Message Given:  Yes     Latishia Suitt Montine Circle 03/06/2022, 3:59 PM

## 2022-03-06 NOTE — Progress Notes (Signed)
Patient continues to request PRN Norco for ABD pain. Medication has been effective rating pain 3/10. PRN Hydralazine given for BP over 180.

## 2022-03-06 NOTE — Assessment & Plan Note (Signed)
Overnight patient developed abdominal pain and bloating.  Lipase up to 669 today -Bowel rest - Analgesics - IV fluids

## 2022-03-06 NOTE — Progress Notes (Signed)
TRH night cross cover note:   I was notified by RN that the patient is experiencing some mild abdominal discomfort and is requesting prn acetaminophen or prn ibuprofen for this.  I subsequently placed an order for prn acetaminophen.     Babs Bertin, DO Hospitalist

## 2022-03-06 NOTE — Progress Notes (Signed)
  Progress Note   Patient: Shirley Nichols GGE:[id number] DOB: 11-Mar-1941 DOA: 03/03/2022     3 DOS: the patient was seen and examined on 03/06/2022 at 9:14AM      Brief hospital course: Shirley Nichols is an 81 y.o. F with HTN, HLD who presented with hyperbilirubinemia and renal failure.  Developed generalized weakness in the last few weeks, went to PCP, found to have jaundice, elevated LFTs and AKI, sent to the ER.   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  Lipase up to 669 today -Bowel rest - Analgesics - IV fluids   Hypokalemia Supplemented and resolved  Hyperlipidemia - Hold statin given transaminitis - Hold Zetia  AKI (acute kidney injury) ([place]) Baseline unknown, but per patient, reportedly normal.  Here creatinine 2.46 on admission, 1.8 yesterday, then back up above 2 today. I have called her PCPs office to get records, awaiting a callback Fena >2% actually, P/c ratio 0.3, renal US unremarkable - Hold ARB, diuretic - Continue IV fluids - Follow SPEP - Trend SCr  Hypertension Blood pressure elevated - Hold valsartan, HCTZ due to renal failure -Continue hydralazine as needed for severe range pressures          Subjective: Patient with bloating and pain today, no vomiting, no confusion, still scleral icterus and fatigue.     Physical Exam: BP (!) 157/112 (BP Location: Left Arm)   Pulse 79   Temp 98 F (36.7 C) (Oral)   Resp 18   SpO2 96%   Thin elderly female, lying in bed, appears uncomfortable Sclera icteric.  Lids and lashes normal.  No  nasal forming, discharge, or epistaxis. RRR, no murmurs, no peripheral edema Respiratory rate normal, lungs clear without rales or wheezes Mild tenderness to palpation without guarding, rebound, rigidity, or distention Attention normal, affect appropriate, judgment and insight appear normal    Data Reviewed: Discussed with gastroenterology Lipase elevated LFTs improved Creatinine back up to 2.2 Hemoglobin 10, white blood cells normal   Family Communication: Husband at the bedside    Disposition: Status is: Inpatient The patient was admitted with obstructive jaundice, pancreatic mass  She underwent ERCP, and today has post ERCP pancreatitis  We will start fluids, observe overnight, try to advance diet tomorrow.  In the meantime we will continue to monitor renal function and try to ascertain her baseline.        Author: Edwin Dada, MD 03/06/2022 1:52 PM  For on call review [email or web address].

## 2022-03-06 NOTE — Progress Notes (Signed)
Daily Rounding Note  03/06/2022, 8:37 AM  LOS: 3 days   SUBJECTIVE:   Chief complaint:     Pain across upper abd bil since last PM. Tolerated pork chop at dinner, this before  onset pain.  Had BM yesterday.  No N/V  OBJECTIVE:         Vital signs in last 24 hours:    Temp:  [97.4 F (36.3 C)-98.2 F (36.8 C)] 98 F (36.7 C) (10/09 0748) Pulse Rate:  [63-79] 79 (10/09 0748) Resp:  [14-18] 18 (10/09 0748) BP: (108-165)/(55-112) 157/112 (10/09 0748) SpO2:  [92 %-99 %] 96 % (10/09 0748) Last BM Date : 03/03/22 (pta) There were no vitals filed for this visit. General: pale.  Looks moderately ill.  Alert.     Heart: RRR Chest: clear bil Abdomen: soft, tender wo g/r across upper belly bil. Active BS.  No distention.    Extremities: no CCE Neuro/Psych:  oriented x 3.  No tremor or deficits.  Fluid speech.    Intake/Output from previous day: 10/08 0701 - 10/09 0700 In: 1937.7 [I.V.:1737.7; IV Piggyback:200] Out: -   Intake/Output this shift: No intake/output data recorded.  Lab Results: Recent Labs    03/03/22 1016 03/04/22 0344 03/06/22 0014  WBC 10.1 8.2 8.9  HGB 13.0 10.9* 10.4*  HCT 39.6 32.8* 32.1*  PLT 323 274 244   BMET Recent Labs    03/04/22 0344 03/05/22 0649 03/06/22 0014  NA 136 138 136  K 3.1* 3.7 4.3  CL 100 102 107  CO2 '24 23 23  '$ GLUCOSE 106* 95 136*  BUN 48* 31* 43*  CREATININE 2.31* 1.81* 2.22*  CALCIUM 8.6* 9.6 8.3*   LFT Recent Labs    03/04/22 0344 03/05/22 0649 03/06/22 0014  PROT 5.6* 5.5* 5.4*  ALBUMIN 2.3* 2.2* 2.1*  AST 135* 127* 92*  ALT 169* 154* 140*  ALKPHOS 588* 615* 529*  BILITOT 3.9* 3.9* 1.7*   PT/INR No results for input(s): "LABPROT", "INR" in the last 72 hours. Hepatitis Panel No results for input(s): "HEPBSAG", "HCVAB", "HEPAIGM", "HEPBIGM" in the last 72 hours.  Studies/Results: DG ERCP  Result Date: 03/06/2022 CLINICAL DATA:  Pancreatic mass,  malignant biliary obstruction EXAM: ERCP TECHNIQUE: Multiple spot images obtained with the fluoroscopic device and submitted for interpretation post-procedure. FLUOROSCOPY: Radiation Exposure Index (as provided by the fluoroscopic device): 74.051 mGy Kerma COMPARISON:  None Available. FINDINGS: Six fluoroscopic intraoperative radiographs are presented for interpretation postprocedurally. Initial image demonstrates wire cannulation of the pancreatic duct and subsequent retrograde wire cannulation of the common duct. Contrast installation demonstrates marked extrahepatic biliary ductal dilation with a roughly 4-5 cm stricture involving the distal common duct. Subsequent images demonstrate palliative biliary stenting of the distal extrahepatic duct with partial evacuation of biliary contrast. The intrahepatic biliary tree is not well opacified on this examination. No extraluminal extension of contrast identified. 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 Subsequent palliative biliary stenting of the stricture. These images were submitted for radiologic interpretation only. Please see the procedural report for the amount of contrast and the fluoroscopy time utilized. Electronically Signed   By: Fidela Salisbury M.D.   On: 03/06/2022 04:17   DG C-Arm 1-60 Min-No Report  Result Date: 03/05/2022 Fluoroscopy was utilized by the requesting physician.  No radiographic interpretation.   DG C-Arm 1-60 Min-No Report  Result Date: 03/05/2022 Fluoroscopy was  utilized by the requesting physician.  No radiographic interpretation.   US RENAL  Result Date: 03/04/2022 CLINICAL DATA:  Renal dysfunction EXAM: RENAL / URINARY TRACT ULTRASOUND COMPLETE COMPARISON:  CT done on 03/03/2022 FINDINGS: Right Kidney: Renal measurements: 10 x 4.5 x 5.4 cm = volume: 127.2 mL. There is no hydronephrosis. There is a 1.7 cm  cyst in the medial aspect of right kidney. There is trace amount of free fluid adjacent to the right kidney. Left Kidney: Renal measurements: 11.5 x 6.2 x 5.2 cm = volume: 193 x 9 mL. There is no hydronephrosis. Technologist observed a 2.6 cm structure with focal bulge in the cortical margin in the midportion of left kidney. In MRI done on 03/03/2022, no definite significant focal abnormalities were noted in the left kidney. Bladder: Appears normal for degree of bladder distention. Other: None. IMPRESSION: There is no hydronephrosis. Minimal right perinephric fluid. Right renal cyst. There is focal bulge in cortical margin in the midportion of left kidney, possibly partial volume averaging artifact. In previous MR done on 03/03/2022, no significant focal left renal lesion was seen. Electronically Signed   By: Elmer Picker M.D.   On: 03/04/2022 14:44    ASSESMENT:   Obstructive jaundice.  Wt loss.  Dilated bile ducts, sludge and GB stones on [place].  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  Haziness, stranding at [facility], dilated GB and CBD on non cont CT.    10/8 ERCP:   dilated mid/upper CBD due to obstructing mass.  69m x 4 cm CMS placed w resulting good drainage of bile.   Cyto brushings collected.  LFTs improving.  No tumor markers collected.      Abd pain.  New as of last PM.  Post ERCP pancreatitis?.      Elevated Lipase on 10/6, no fup since.     Anemia.  MCV normal.      AKI.  GFR better: 19... 22.   PLAN    Lipase level now.  Not ready to discharge yet, pt states she is not going home and I agree. Ordered one dose hydrocodone, but O/w leave pain mgt to attending MD     SAzucena Freed 03/06/2022, 8:37 AM Phone [phone]

## 2022-03-07 ENCOUNTER — Encounter (HOSPITAL_COMMUNITY): Payer: Self-pay | Admitting: Gastroenterology

## 2022-03-07 DIAGNOSIS — E785 Hyperlipidemia, unspecified: Secondary | ICD-10-CM | POA: Diagnosis not present

## 2022-03-07 DIAGNOSIS — K859 Acute pancreatitis without necrosis or infection, unspecified: Secondary | ICD-10-CM | POA: Diagnosis not present

## 2022-03-07 DIAGNOSIS — N179 Acute kidney failure, unspecified: Secondary | ICD-10-CM | POA: Diagnosis not present

## 2022-03-07 DIAGNOSIS — C801 Malignant (primary) neoplasm, unspecified: Secondary | ICD-10-CM | POA: Diagnosis not present

## 2022-03-07 DIAGNOSIS — I10 Essential (primary) hypertension: Secondary | ICD-10-CM | POA: Diagnosis not present

## 2022-03-07 DIAGNOSIS — K9189 Other postprocedural complications and disorders of digestive system: Secondary | ICD-10-CM | POA: Diagnosis not present

## 2022-03-07 DIAGNOSIS — K831 Obstruction of bile duct: Secondary | ICD-10-CM | POA: Diagnosis not present

## 2022-03-07 LAB — COMPREHENSIVE METABOLIC PANEL
ALT: 212 U/L — ABNORMAL HIGH (ref 0–44)
AST: 176 U/L — ABNORMAL HIGH (ref 15–41)
Albumin: 2.3 g/dL — ABNORMAL LOW (ref 3.5–5.0)
Alkaline Phosphatase: 693 U/L — ABNORMAL HIGH (ref 38–126)
Anion gap: 13 (ref 5–15)
BUN: 32 mg/dL — ABNORMAL HIGH (ref 8–23)
CO2: 20 mmol/L — ABNORMAL LOW (ref 22–32)
Calcium: 9 mg/dL (ref 8.9–10.3)
Chloride: 103 mmol/L (ref 98–111)
Creatinine, Ser: 1.78 mg/dL — ABNORMAL HIGH (ref 0.44–1.00)
GFR, Estimated: 28 mL/min — ABNORMAL LOW (ref >60)
Glucose, Bld: 87 mg/dL (ref 70–99)
Potassium: 3.6 mmol/L (ref 3.5–5.1)
Sodium: 136 mmol/L (ref 135–145)
Total Bilirubin: 2.3 mg/dL — ABNORMAL HIGH (ref 0.3–1.2)
Total Protein: 5.8 g/dL — ABNORMAL LOW (ref 6.5–8.1)

## 2022-03-07 LAB — LIPASE, BLOOD: Lipase: 163 U/L — ABNORMAL HIGH (ref 11–51)

## 2022-03-07 MED ORDER — HYDROCHLOROTHIAZIDE 25 MG PO TABS
25.0000 mg | ORAL_TABLET | Freq: Every day | ORAL | Status: DC
  Administered 2022-03-07: 25 mg via ORAL
  Filled 2022-03-07: qty 1

## 2022-03-07 NOTE — Progress Notes (Signed)
      Progress Note   Subjective  Patient feeling better this AM. Tolerated liquids this AM, has a better appetite, pain better controlled.    Objective   Vital signs in last 24 hours: Temp:  [97.8 F (36.6 C)-99 F (37.2 C)] 99 F (37.2 C) (10/10 0751) Pulse Rate:  [64-86] 83 (10/10 0751) Resp:  [17-18] 18 (10/10 0751) BP: (160-182)/(68-81) 164/81 (10/10 0751) SpO2:  [92 %-96 %] 96 % (10/10 0853) Last BM Date : 03/05/22 General:    white female in NAD Abdomen:  Soft, nontender and nondistended.  Neurologic:  Alert and oriented,  grossly normal neurologically. Psych:  Cooperative. Normal mood and affect.  Intake/Output from previous day: 10/09 0701 - 10/10 0700 In: 555.1 [I.V.:555.1] Out: -  Intake/Output this shift: No intake/output data recorded.  Lab Results: Recent Labs    03/06/22 0014  WBC 8.9  HGB 10.4*  HCT 32.1*  PLT 244   BMET Recent Labs    03/05/22 0649 03/06/22 0014 03/07/22 0240  NA 138 136 136  K 3.7 4.3 3.6  CL 102 107 103  CO2 23 23 20*  GLUCOSE 95 136* 87  BUN 31* 43* 32*  CREATININE 1.81* 2.22* 1.78*  CALCIUM 9.6 8.3* 9.0   LFT Recent Labs    03/07/22 0240  PROT 5.8*  ALBUMIN 2.3*  AST 176*  ALT 212*  ALKPHOS 693*  BILITOT 2.3*   PT/INR No results for input(s): "LABPROT", "INR" in the last 72 hours.  Studies/Results: No results found.     Assessment / Plan:    81 y/o female here for the following issues:  Pancreatic mass Obstructive jaundice Post-ERCP pancreatitis  S/p biliary stenting and now recovering from post ERCP pancreatitis. She is feeling better today. Okay to advance diet to low fat. If she does well with diet today can likely discharge home tomorrow. LFTs have fluctuated post biliary stenting, will trend.  Moving forward, discussed the likelihood for pancreatic cancer. Awaiting brushings of the stricture, may not be back for a few days. If biopsies negative then will need an EUS as outpatient. Pending  pathology results, she will need Oncology consultation, surgical consultation, etc, and case discussed at [facility]. I think reasonable to get a chest CT while in house to complete her staging assuming likelihood of malignant process.   Recommend: - low fat diet okay - CT chest for staging - trend LFTs - hopefully home tomorrow - await pathology results with further recommendations  Jolly Mango, MD [facility]

## 2022-03-07 NOTE — Progress Notes (Signed)
Mobility Specialist: Progress Note   03/07/22 1056  Mobility  Activity Ambulated with assistance in hallway  Level of Assistance Contact guard assist, steadying assist  Assistive Device Other (Comment) (HHA)  Distance Ambulated (ft) 200 ft  Activity Response Tolerated well  $Mobility charge 1 Mobility   Pt received in the bed sleeping, easily awakened. Pt agreeable to mobility after going to BR. Contact guard for balance. Pt sitting EOB after session with call bell and phone in reach.   [place] Minoru Chap Mobility Specialist Secure Chat Only

## 2022-03-07 NOTE — Progress Notes (Signed)
Patients husband was assisting patient to the restroom. Husband stopped to untangle the electrical cord on patients IV. When husband stopped patient sat down thinking she was at the toilet. No injury, no complaints of pain. Educated patient and spouse Mortimer Fries to call for assistance when getting OOB. Bed alarm on. Dr. Loleta Books notified, no new orders.

## 2022-03-07 NOTE — Progress Notes (Signed)
Progress Note   Patient: Shirley Nichols DGU:[id number] DOB: [birth year] DOA: 03/03/2022     4 DOS: the patient was seen and examined on 03/07/2022 at 9:31AM      Brief hospital course: Shirley Nichols is an 81 y.o. F with HTN, HLD who presented with hyperbilirubinemia and renal failure.  Developed generalized weakness in the last few weeks, went to PCP, found to have jaundice, elevated LFTs and AKI, sent to the ER.   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 - If biopsy results not back at discharge, patient will follow up with [place] for biopsy results              - If biopsy inconclusive, will undergo outpatient EUS              - If biopsy malignant, [facility] will send referral to [facility] Oncology  - If Cr better tomorrow, will need CT chest with contrast prior to discharge as part of staging    AKI (acute kidney injury) ([place]) Baseline Cr 0.9 just a few months ago, per PCP.  Here creatinine 2.46 on admission, 1.8 with fluids, then 2.2 yesterday post ERCP.    Back on fluids, improved to 1.7 today.    Renal US unremarkable, suspect this was ischemic injury from ARB and poor PO intake. - Hold ARB - Continue IV fluids - Follow SPEP - Trend SCr     Post-ERCP acute pancreatitis Overnight patient developed abdominal pain and bloating.  Lipase up to 669   Today, abdominal pain better, appetite better.  Lipase improved. - Advance diet     Hypokalemia Supplemented and resolved  Hyperlipidemia - Hold statin given transaminitis - Hold Zetia  Hypertension Blood pressure elevated -  Hold valsartan due to renal failure - Resume HCTZ - Continue hydralazine as needed for severe range pressures - May need amlodipine at d/c          Subjective: Patient alert but confused overnight with pain medicine, improving this morning, bloating is better, less pain in her abdomen, hungry.  Making good urine.  No fever, no vomiting.     Physical Exam: BP (!) 164/81 (BP Location: Left Arm)   Pulse 83   Temp 99 F (37.2 C) (Oral)   Resp 18   SpO2 96%   Thin elderly female, mild scleral icterus, lying in bed, no acute distress RRR, no murmurs, no peripheral edema Respiratory rate normal, lungs clear without rales or wheezes Abdomen soft without tenderness palpation or guarding, no ascites or distention Attention normal, affect blunted, judgment and insight appear mildly impaired this morning, face symmetric, speech fluent, moves all extremities with normal strength and coordination    Data Reviewed: Discussed with gastroenterology Lipase improved Basic metabolic panel shows creatinine down to 1.7 Total bilirubin slightly up to 2, AST/ALT 176 and 212  Family Communication: Husband at the bedside    Disposition: Status is: Inpatient The patient was admitted for malignant appearing mass in the pancreas as well as acute kidney injury.  Underwent ERCP and stenting by Dr. Benson Norway over the weekend.  Post ERCP she has had some mild pancreatitis, but  this appears to be improved today and we will start diet.  If her creatinine is improving tomorrow and she is able to tolerate diet overnight tonight, likely home tomorrow        Author: Edwin Dada, MD 03/07/2022 1:33 PM  For on call review [email or web address].

## 2022-03-07 NOTE — Progress Notes (Signed)
Patients husband upset that dinner tray has not arrieved. Husband states that Dr. Havery Moros told him he was ordering her tray. Writer tried to explain that the Dr. Doristine Section her new diet orders in and that's probably what the Dr. was trying to explain. Patients husband yelling I am not an idiot,he told me he was ordering her food. Suggested to husband to order patient her tray, writer can assist her when dinner arrives and we can clear all this up in the morning.

## 2022-03-08 ENCOUNTER — Inpatient Hospital Stay (HOSPITAL_COMMUNITY): Payer: PPO

## 2022-03-08 ENCOUNTER — Encounter (HOSPITAL_COMMUNITY): Payer: Self-pay | Admitting: Family Medicine

## 2022-03-08 DIAGNOSIS — K859 Acute pancreatitis without necrosis or infection, unspecified: Secondary | ICD-10-CM | POA: Diagnosis not present

## 2022-03-08 DIAGNOSIS — K831 Obstruction of bile duct: Secondary | ICD-10-CM | POA: Diagnosis not present

## 2022-03-08 DIAGNOSIS — C801 Malignant (primary) neoplasm, unspecified: Secondary | ICD-10-CM | POA: Diagnosis not present

## 2022-03-08 DIAGNOSIS — K9189 Other postprocedural complications and disorders of digestive system: Secondary | ICD-10-CM | POA: Diagnosis not present

## 2022-03-08 LAB — URINALYSIS, COMPLETE (UACMP) WITH MICROSCOPIC
Bilirubin Urine: NEGATIVE
Glucose, UA: NEGATIVE mg/dL
Hgb urine dipstick: NEGATIVE
Ketones, ur: NEGATIVE mg/dL
Leukocytes,Ua: NEGATIVE
Nitrite: NEGATIVE
Protein, ur: NEGATIVE mg/dL
Specific Gravity, Urine: 1.011 (ref 1.005–1.030)
pH: 5 (ref 5.0–8.0)

## 2022-03-08 LAB — BLOOD GAS, VENOUS
Acid-Base Excess: 2.2 mmol/L — ABNORMAL HIGH (ref 0.0–2.0)
Bicarbonate: 25.3 mmol/L (ref 20.0–28.0)
O2 Saturation: 92 %
Patient temperature: 37.2
pCO2, Ven: 34 mmHg — ABNORMAL LOW (ref 44–60)
pH, Ven: 7.48 — ABNORMAL HIGH (ref 7.25–7.43)
pO2, Ven: 55 mmHg — ABNORMAL HIGH (ref 32–45)

## 2022-03-08 LAB — TSH: TSH: 0.267 u[IU]/mL — ABNORMAL LOW (ref 0.350–4.500)

## 2022-03-08 LAB — COMPREHENSIVE METABOLIC PANEL
ALT: 120 U/L — ABNORMAL HIGH (ref 0–44)
AST: 58 U/L — ABNORMAL HIGH (ref 15–41)
Albumin: 1.9 g/dL — ABNORMAL LOW (ref 3.5–5.0)
Alkaline Phosphatase: 438 U/L — ABNORMAL HIGH (ref 38–126)
Anion gap: 11 (ref 5–15)
BUN: 30 mg/dL — ABNORMAL HIGH (ref 8–23)
CO2: 23 mmol/L (ref 22–32)
Calcium: 8.1 mg/dL — ABNORMAL LOW (ref 8.9–10.3)
Chloride: 102 mmol/L (ref 98–111)
Creatinine, Ser: 1.63 mg/dL — ABNORMAL HIGH (ref 0.44–1.00)
GFR, Estimated: 31 mL/min — ABNORMAL LOW (ref >60)
Glucose, Bld: 88 mg/dL (ref 70–99)
Potassium: 2.8 mmol/L — ABNORMAL LOW (ref 3.5–5.1)
Sodium: 136 mmol/L (ref 135–145)
Total Bilirubin: 1.7 mg/dL — ABNORMAL HIGH (ref 0.3–1.2)
Total Protein: 4.9 g/dL — ABNORMAL LOW (ref 6.5–8.1)

## 2022-03-08 LAB — CBC
HCT: 27.3 % — ABNORMAL LOW (ref 36.0–46.0)
Hemoglobin: 9.6 g/dL — ABNORMAL LOW (ref 12.0–15.0)
MCH: 26.8 pg (ref 26.0–34.0)
MCHC: 35.2 g/dL (ref 30.0–36.0)
MCV: 76.3 fL — ABNORMAL LOW (ref 80.0–100.0)
Platelets: 237 10*3/uL (ref 150–400)
RBC: 3.58 MIL/uL — ABNORMAL LOW (ref 3.87–5.11)
RDW: 15.2 % (ref 11.5–15.5)
WBC: 17.4 10*3/uL — ABNORMAL HIGH (ref 4.0–10.5)
nRBC: 0 % (ref 0.0–0.2)

## 2022-03-08 LAB — PROTEIN ELECTROPHORESIS, SERUM
A/G Ratio: 0.8 (ref 0.7–1.7)
Albumin ELP: 2.2 g/dL — ABNORMAL LOW (ref 2.9–4.4)
Alpha-1-Globulin: 0.4 g/dL (ref 0.0–0.4)
Alpha-2-Globulin: 1 g/dL (ref 0.4–1.0)
Beta Globulin: 0.9 g/dL (ref 0.7–1.3)
Gamma Globulin: 0.5 g/dL (ref 0.4–1.8)
Globulin, Total: 2.8 g/dL (ref 2.2–3.9)
Total Protein ELP: 5 g/dL — ABNORMAL LOW (ref 6.0–8.5)

## 2022-03-08 LAB — AMMONIA: Ammonia: 33 umol/L (ref 9–35)

## 2022-03-08 LAB — LIPASE, BLOOD: Lipase: 35 U/L (ref 11–51)

## 2022-03-08 LAB — PROTIME-INR
INR: 1.2 (ref 0.8–1.2)
Prothrombin Time: 14.9 seconds (ref 11.4–15.2)

## 2022-03-08 LAB — MAGNESIUM: Magnesium: 1 mg/dL — ABNORMAL LOW (ref 1.7–2.4)

## 2022-03-08 LAB — LACTIC ACID, PLASMA
Lactic Acid, Venous: 0.8 mmol/L (ref 0.5–1.9)
Lactic Acid, Venous: 0.6 mmol/L (ref 0.5–1.9)

## 2022-03-08 MED ORDER — POTASSIUM CHLORIDE 2 MEQ/ML IV SOLN: INTRAVENOUS | Status: DC

## 2022-03-08 MED ORDER — SODIUM CHLORIDE 0.9 % IV SOLN: 1.5000 g | Freq: Three times a day (TID) | INTRAVENOUS | Status: DC

## 2022-03-08 MED ORDER — POTASSIUM CHLORIDE 10 MEQ/100ML IV SOLN
10.0000 meq | INTRAVENOUS | Status: AC
  Administered 2022-03-08 (×2): 10 meq via INTRAVENOUS
  Filled 2022-03-08 (×2): qty 100

## 2022-03-08 MED ORDER — POTASSIUM CHLORIDE CRYS ER 20 MEQ PO TBCR
40.0000 meq | EXTENDED_RELEASE_TABLET | Freq: Once | ORAL | Status: AC
  Administered 2022-03-08: 40 meq via ORAL
  Filled 2022-03-08: qty 2

## 2022-03-08 MED ORDER — POTASSIUM CHLORIDE 2 MEQ/ML IV SOLN
INTRAVENOUS | Status: DC
  Filled 2022-03-08 (×9): qty 1000

## 2022-03-08 MED ORDER — SODIUM CHLORIDE 0.9 % IV SOLN
3.0000 g | Freq: Two times a day (BID) | INTRAVENOUS | Status: DC
  Administered 2022-03-08 – 2022-03-10 (×5): 3 g via INTRAVENOUS
  Filled 2022-03-08 (×5): qty 8

## 2022-03-08 MED ORDER — MAGNESIUM SULFATE 4 GM/100ML IV SOLN
4.0000 g | Freq: Once | INTRAVENOUS | Status: AC
  Administered 2022-03-08: 4 g via INTRAVENOUS
  Filled 2022-03-08: qty 100

## 2022-03-08 NOTE — Progress Notes (Signed)
TRH night cross cover note:   I was notified by RN of low potassium level this morning per labs collected at 3 AM, which reflect serum potassium level of 2.8, down from 3.6 yesterday.  CMP this AM also notable for improving renal function, with creatinine now down to 1.63 and interval resolution of non- anion gap metabolic acidosis, both of which are suspected to be contributing to intracellular shift of potassium.   I have ordered potassium chloride 40 meq p.o. x1 dose now as well as potassium chloride 20 meq IV over 2 hours.  I will also add on a magnesium level.      Babs Bertin, DO Hospitalist

## 2022-03-08 NOTE — Progress Notes (Signed)
On call doc informed about pt mental status just being able to respond to name and not following commands. Concern express due to to her being alert and oriented *4  on the night of the 8-9th/ Oct. Also noted the elevation in Wbc. Plan for labs see new orders. Suggestion for CT doctor voiced that same is not needed at this time and could be possibly done on the day shift as this is more the progress of the patient's illness,

## 2022-03-08 NOTE — Progress Notes (Signed)
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 Benson Norway consulted ERCP stent placement 10/9-post ERCP pancreatitis with AKI, pain and discomfort placed on pain meds 10/10 pancreatitis resolved but increasing confusion    Hospital-Problem based course  Toxic metabolic encephalopathy 2/2 developing aspiration pneumonia do not think any other causes she was normal prior to being given opiates which have completely discontinued Confusion started on 10/10 when she was given meds for pain in her belly-start Unasyn -if this does not improve CT head,  ammonia was 33  Pneumonia Leukocytosis 17, CXR suggestive of right-sided pathology Downgrade diet to dysphagia 3 and only feed while awake  Pancreatic head mass 3.4 cm malignant obstructive jaundice + covered metal stent placed in CBD 10/8 Postoperative pancreatitis Lipase is now within normal range in the 30s down from 669 on the night Transaminitis improving bilirubin now 1.7  Profound hypomagnesemia of 1.0 Hypokalemia  2.8 Given 40 of K orally, add NS plus K 40 mEq and 100 cc/H LR Given 4 g of mag IV DC HCTZ for now Repeat all labs in the morning and monitor mentation  HTN Previously on valsartan HCTZ-both have been discontinued Use hydralazine IV for  now  DVT prophylaxis: Lovenox Code Status: Full Family Communication: Husband at the bedside updated 10/11 Disposition:  Status is: Inpatient Remains inpatient appropriate because:   Still confused and somnolent and now might have pneumonia   Consultants:  GI  Procedures: None  Antimicrobials: Unasyn started 10/11   Subjective: Arouses but is quite confused-received pain meds when had pancreatitis flare after ERCP-still not back to baseline-husband relates patient was sitting on the floor and did not know where she was-he tells me she might have some cognitive issues over the past 3 to 4 months but usually is oriented She is unable to answer orienting questions arouses but still remains quite confused cannot tell me date time year place or president  Objective: Vitals:   03/07/22 1940 03/07/22 1956 03/08/22 0412 03/08/22 0816  BP:  (!) 152/75 (!) 148/73 (!) 145/64  Pulse:  81 81 79  Resp:  _0 Temp:  (!) 97.5 F (36.4 C) 98.9 F (37.2 C) 99.5 F (37.5 C)  TempSrc:   Oral Oral  SpO2: 92% 92% 92% 94%    Intake/Output Summary (Last 24 hours) at 03/08/2022 0920 Last data filed at 03/08/2022 3013 Gross per 24 hour  Intake 3327.31 ml  Output --  Net 3327.31 ml   There were no vitals filed for this visit.  Examination:  Awake disoriented white female no apparent distress No icterus no pallor ROM intact follows some commands slight tremor of both upper extremities-no flap Abdomen is soft no rebound no guarding Is able to lift both legs off the bed She does seem slow to respond but is able to follow commands-there is no field cut deficit that I  can appreciate although it is difficult to get an exam on her She moves all 4 limbs appropriately   Data Reviewed: personally reviewed   CBC    Component Value Date/Time   WBC 17.4 (H) 03/08/2022 0319   RBC 3.58 (L) 03/08/2022 0319   HGB 9.6 (L) 03/08/2022 0319   HCT 27.3 (L) 03/08/2022 0319   PLT 237 03/08/2022  0319   MCV 76.3 (L) 03/08/2022 0319   MCH 26.8 03/08/2022 0319   MCHC 35.2 03/08/2022 0319   RDW 15.2 03/08/2022 0319      Latest Ref Rng & Units 03/08/2022    3:19 AM 03/07/2022    2:40 AM 03/06/2022   12:14 AM  CMP  Glucose 70 - 99 mg/dL 88  87  136   BUN 8 - 23 mg/dL 30  32  43   Creatinine 0.44 - 1.00 mg/dL 1.63  1.78  2.22   Sodium 135 - 145 mmol/L 136  136  136   Potassium 3.5 - 5.1 mmol/L 2.8  3.6  4.3   Chloride 98 - 111 mmol/L 102  103  107   CO2 22 - 32 mmol/L _0 Calcium 8.9 - 10.3 mg/dL 8.1  9.0  8.3   Total Protein 6.5 - 8.1 g/dL 4.9  5.8  5.4   Total Bilirubin 0.3 - 1.2 mg/dL 1.7  2.3  1.7   Alkaline Phos 38 - 126 U/L 438  693  529   AST 15 - 41 U/L 58  176  92   ALT 0 - 44 U/L 120  212  140      Radiology Studies: DG CHEST PORT 1 VIEW  Result Date: 03/08/2022 CLINICAL DATA:  Acute encephalopathy.  Confusion. EXAM: PORTABLE CHEST 1 VIEW COMPARISON:  None Available. FINDINGS: The cardiac silhouette, mediastinal and hilar contours are within normal limits given the AP projection and portable technique. Asymmetric reticulonodular interstitial lung pattern more pronounced on the right. Findings could be due to chronic interstitial process or atypical/viral pneumonia or MAC. No focal pulmonary infiltrates or pleural effusions. No pulmonary lesions. IMPRESSION: Asymmetric reticulonodular interstitial lung pattern could be due to chronic interstitial process or atypical/viral pneumonia or MAC. Electronically Signed   By: Marijo Sanes M.D.   On: 03/08/2022 08:32     Scheduled Meds:  docusate sodium  100 mg Oral BID   enoxaparin (LOVENOX) injection  30 mg Subcutaneous Q24H   mometasone-formoterol  2 puff Inhalation BID   Continuous Infusions:  ampicillin-sulbactam (UNASYN) IV 3 g (03/08/22 0909)   lactated ringers 1,000 mL with potassium chloride 40 mEq infusion     magnesium sulfate bolus IVPB 4 g (03/08/22 0910)   potassium chloride 10 mEq (03/08/22 0909)      LOS: 5 days   Time spent: [place], MD [facility] To contact the attending provider between 7A-7P or the covering provider during after hours 7P-7A, please log into the web site [email or web address] and access using universal  password for that web site. If you do not have the password, please call the hospital operator.  03/08/2022, 9:20 AM

## 2022-03-08 NOTE — Progress Notes (Signed)
On call doc informed about potassium of 2.8 see new orders.

## 2022-03-08 NOTE — Progress Notes (Signed)
TRH night cross cover note:   I was contacted by patient's RN, who conveyed concern regarding the trend in the patient's mental status.  Specifically, throughout this night shift, the patient has been somnolent, briefly opening her eyes to verbal stimuli before falling back asleep.  Unable to stay awake long enough to answer orientation assessment questions.  Per RN, the patient was also noted to be confused during the day shift (10/10), with an element of progression in her confusion noted during that shift as well, including an associated instance in which the patient was attempting to ambulate to the bedside commode but instead sat down on the floor thinking that she had arrived on the toilet.  This is relative to the previous night shift (10/9 into 10/10) when the patient was noted to be much more alert.  Regarding the night shift from 10/9 into 10/10, hospitalist note conveys that while the patient was alert, she was exhibiting evidence of confusion.   Per these reports and preliminary chart review, it sounds like the patient has been exhibiting some confusion with progressive somnolence over the course of the last day, although her specific baseline mental status is not entirely clear to me.   Over this current night shift, her vital signs have been stable, and she remains afebrile.   It is noted that she is here for obstructive jaundice in the setting of mass in the head of pancreas, status post ERCP on 03/05/2022 at which time she underwent biliary stent placement.  Per results of this morning's labs, it appears that her liver enzymes are trending down, including her total bilirubin.  Renal function improving, and interval resolution of non-anion gap metabolic acidosis.  However, this morning, CBC reflects new onset leukocytosis, with white blood cell count of 17,000, representing a doubling of her white blood cell count over the preceding 48 hours.  Therefore, in the context of potential acute  encephalopathy associated with progressive somnolence and interval development of leukocytosis, will expand evaluation for potential acute encephalopathy, including infectious evaluation.  will check urinalysis, chest x-ray.  Given interval improvement in liver enzymes in the absence of fever, ascending cholangitis appears less likely at this time.   In considering potential pharmacologic contributions to her somnolence, I reviewed her recent opioid regimen and medication administration history, and found that she received 3 doses of Norco on 03/06/2022, but no additional doses of opioids since that time.   She is noted to be a former smoker, and will check blood gas to evaluate for any contribution from hypercapnic encephalopathy.  Given presenting transaminitis, albeit in the setting of obstructive jaundice from pancreatic mass, will also check ammonia level as well as INR. I also d\c'ed her order prn iv fentanyl, although it does not appear that she has been receiving any doses of this.  Refraining from dedicated imaging of the head at this time given the above history, which appears more suggestive of a gradual process over the course the last day, as opposed to abrupt onset of mental status change.     Babs Bertin, DO Hospitalist

## 2022-03-08 NOTE — Progress Notes (Signed)
PHARMACY NOTE:  ANTIMICROBIAL RENAL DOSAGE ADJUSTMENT  Current antimicrobial regimen includes a mismatch between antimicrobial dosage and estimated renal function.  As per policy approved by the Pharmacy & Therapeutics and Medical Executive Committees, the antimicrobial dosage will be adjusted accordingly.  Current antimicrobial dosage:  Unasyn 1.5 g IV q8h  Indication: aspiration pneumonia  Renal Function: Estimated Creatinine Clearance: 24.8 mL/min (A) (by C-G formula based on SCr of 1.63 mg/dL (H)). '[]'$      On intermittent HD, scheduled: '[]'$      On CRRT    Antimicrobial dosage has been changed to:  Unasyn 3g IV q12h  Additional comments:   Thank you for allowing pharmacy to be a part of this patient's care.  Nicole Cella, RPh Clinical Pharmacist [phone] 03/08/2022 9:02 AM

## 2022-03-08 NOTE — Progress Notes (Signed)
Mobility Specialist Progress Note:   03/08/22 1655  Mobility  Activity Refused mobility   Pt refused mobility d/t recently receiving dinner and not wanting to walk today. Will f/u as able.    Shirley Nichols Mobility Specialist-Acute Rehab Secure Chat only

## 2022-03-08 NOTE — Progress Notes (Addendum)
Daily Rounding Note  03/08/2022, 2:28 PM  LOS: 5 days   SUBJECTIVE:   Chief complaint:    Obstructive jaundice.  Post ERCP pancreatitis.  Husband tells me patient's mental status is improved today.  Attending MD downgraded her to a dysphagia 1 diet because of suspected aspiration pneumonia in the right lung.  She is only to be fed/to eat if she is awake enough. But also says she has not had pain for at least 48 to 72 hours.  Also not nauseous.  OBJECTIVE:         Vital signs in last 24 hours:    Temp:  [97.5 F (36.4 C)-99.9 F (37.7 C)] 99.5 F (37.5 C) (10/11 0816) Pulse Rate:  [76-81] 79 (10/11 0816) Resp:  [17-20] 18 (10/11 0816) BP: (141-152)/(64-86) 145/64 (10/11 0816) SpO2:  [92 %-96 %] 94 % (10/11 0816) Last BM Date : 03/05/22 There were no vitals filed for this visit.  Patient is sleeping quietly after being up.  Shes been sleeping for about a half an hour.  I did not wake her up for the exam.  Intake/Output from previous day: 10/10 0701 - 10/11 0700 In: 3327.3 [I.V.:3267.9; IV Piggyback:59.4] Out: 1 [Urine:1]  Intake/Output this shift: No intake/output data recorded.  Lab Results: Recent Labs    03/06/22 0014 03/08/22 0319  WBC 8.9 17.4*  HGB 10.4* 9.6*  HCT 32.1* 27.3*  PLT 244 237   BMET Recent Labs    03/06/22 0014 03/07/22 0240 03/08/22 0319  NA 136 136 136  K 4.3 3.6 2.8*  CL 107 103 102  CO2 23 20* 23  GLUCOSE 136* 87 88  BUN 43* 32* 30*  CREATININE 2.22* 1.78* 1.63*  CALCIUM 8.3* 9.0 8.1*   LFT Recent Labs    03/06/22 0014 03/07/22 0240 03/08/22 0319  PROT 5.4* 5.8* 4.9*  ALBUMIN 2.1* 2.3* 1.9*  AST 92* 176* 58*  ALT 140* 212* 120*  ALKPHOS 529* 693* 438*  BILITOT 1.7* 2.3* 1.7*   PT/INR Recent Labs    03/08/22 0704  LABPROT 14.9  INR 1.2   Hepatitis Panel No results for input(s): "HEPBSAG", "HCVAB", "HEPAIGM", "HEPBIGM" in the last 72  hours.  Studies/Results: DG CHEST PORT 1 VIEW  Result Date: 03/08/2022 CLINICAL DATA:  Acute encephalopathy.  Confusion. EXAM: PORTABLE CHEST 1 VIEW COMPARISON:  None Available. FINDINGS: The cardiac silhouette, mediastinal and hilar contours are within normal limits given the AP projection and portable technique. Asymmetric reticulonodular interstitial lung pattern more pronounced on the right. Findings could be due to chronic interstitial process or atypical/viral pneumonia or MAC. No focal pulmonary infiltrates or pleural effusions. No pulmonary lesions. IMPRESSION: Asymmetric reticulonodular interstitial lung pattern could be due to chronic interstitial process or atypical/viral pneumonia or MAC. Electronically Signed   By: Marijo Sanes M.D.   On: 03/08/2022 08:32     Scheduled Meds:  docusate sodium  100 mg Oral BID   enoxaparin (LOVENOX) injection  30 mg Subcutaneous Q24H   mometasone-formoterol  2 puff Inhalation BID   Continuous Infusions:  ampicillin-sulbactam (UNASYN) IV 3 g (03/08/22 0909)   lactated ringers 1,000 mL with potassium chloride 40 mEq infusion 125 mL/hr at 03/08/22 0945   PRN Meds:.acetaminophen, hydrALAZINE, ondansetron **OR** ondansetron (ZOFRAN) IV   ASSESMENT:   Post ERCP pancreatitis. Lipase 669... 163.  No abdominal pain for many days  Obstructive jaundice.  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  Cytology still in process, LFTs continue down trending.    AKI, improved.   Microcytic anemia.  Pneumonia, suspected aspiration related.  On Unasyn.  AMS, improved.   PLAN     CA 19-9 ordered.    May need to have speech-language assess her swallowing when she is fully alert so that we can advance her diet if possible.   Await cyto report.  Confirmed w path office that is in process.      Shirley Nichols  03/08/2022, 2:28 PM Phone [phone]

## 2022-03-08 NOTE — Progress Notes (Signed)
Patient resting in bed. Remains continent of bladder. A&O to self, hospital and husbands name. Denies pain or discomfort at this time. Also continues to have poor PO appetite. Encouraged fluids when patient is awake and alert.

## 2022-03-09 ENCOUNTER — Other Ambulatory Visit: Payer: Self-pay | Admitting: Hematology and Oncology

## 2022-03-09 DIAGNOSIS — C801 Malignant (primary) neoplasm, unspecified: Secondary | ICD-10-CM | POA: Diagnosis not present

## 2022-03-09 DIAGNOSIS — K831 Obstruction of bile duct: Secondary | ICD-10-CM | POA: Diagnosis not present

## 2022-03-09 DIAGNOSIS — C250 Malignant neoplasm of head of pancreas: Secondary | ICD-10-CM | POA: Diagnosis not present

## 2022-03-09 LAB — COMPREHENSIVE METABOLIC PANEL
ALT: 104 U/L — ABNORMAL HIGH (ref 0–44)
AST: 56 U/L — ABNORMAL HIGH (ref 15–41)
Albumin: 1.9 g/dL — ABNORMAL LOW (ref 3.5–5.0)
Alkaline Phosphatase: 346 U/L — ABNORMAL HIGH (ref 38–126)
Anion gap: 12 (ref 5–15)
BUN: 30 mg/dL — ABNORMAL HIGH (ref 8–23)
CO2: 19 mmol/L — ABNORMAL LOW (ref 22–32)
Calcium: 7.8 mg/dL — ABNORMAL LOW (ref 8.9–10.3)
Chloride: 104 mmol/L (ref 98–111)
Creatinine, Ser: 1.43 mg/dL — ABNORMAL HIGH (ref 0.44–1.00)
GFR, Estimated: 37 mL/min — ABNORMAL LOW (ref >60)
Glucose, Bld: 83 mg/dL (ref 70–99)
Potassium: 4.2 mmol/L (ref 3.5–5.1)
Sodium: 135 mmol/L (ref 135–145)
Total Bilirubin: 1.2 mg/dL (ref 0.3–1.2)
Total Protein: 5.1 g/dL — ABNORMAL LOW (ref 6.5–8.1)

## 2022-03-09 LAB — CBC
HCT: 28.2 % — ABNORMAL LOW (ref 36.0–46.0)
Hemoglobin: 9.8 g/dL — ABNORMAL LOW (ref 12.0–15.0)
MCH: 26.9 pg (ref 26.0–34.0)
MCHC: 34.8 g/dL (ref 30.0–36.0)
MCV: 77.5 fL — ABNORMAL LOW (ref 80.0–100.0)
Platelets: 252 10*3/uL (ref 150–400)
RBC: 3.64 MIL/uL — ABNORMAL LOW (ref 3.87–5.11)
RDW: 15.4 % (ref 11.5–15.5)
WBC: 12.8 10*3/uL — ABNORMAL HIGH (ref 4.0–10.5)
nRBC: 0 % (ref 0.0–0.2)

## 2022-03-09 LAB — MAGNESIUM: Magnesium: 1.8 mg/dL (ref 1.7–2.4)

## 2022-03-09 LAB — CYTOLOGY - NON PAP

## 2022-03-09 NOTE — Progress Notes (Signed)
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 Dr. Alvy Bimler of Oncology was in the room discussing this already with the patient.  Patient needs a staging CT of her chest to be done, CA 19-9 is pending. CT will be done prior to discharge. MRI brain being done tonight in light of mental status changes. Our question was at this point in time, does the patient warrant an EUS for potential surgical staging. After Dr. Calton Dach conversation with the patient, given the patient's other medical problems right now and her age, they likely will not pursue surgical therapy upfront.  In this light we will wait CT chest first to evaluate for more distant metastases, reserve EUS in case she wishes to pursue surgery at some point time, or consider an interval MRCP.  LFTs otherwise downtrending, biliary stent working appropriately.  Patient will follow up with oncology at [facility] where she lives in close proximity.  Please let us know if we can be of any assistance further, otherwise we will sign off for now.  Thank you  Jolly Mango, MD [facility]

## 2022-03-09 NOTE — Consult Note (Signed)
Subjective: 81yo WF w/ HTN, HL, admitted 03/03/22 for pancreatitis/pancreatic mass, s/p ERCP stent placement but also w/ confusion due to toxic metabolic encephalopathy, complains of blurry vision OU throughout vision, pt is poor historian.  Objective:  VAsc near: OU HM (no improvement w/ +2.50D)  IOP (Tp): OD- 16 mmHg, OS- 17mHg  Pupils: EERL, neg APD (3.585m->2mm OU)  EOM: full OU  CVF: unable OU  Anterior segment: wnl except PCIOL OU  Posterior segment (dilated at 4:37pm): vitreous clear OU, nerve- c/d 0.4 w/ PPA OU, macula- flat without lesions/masses OU, vessels- normal OU, periphery- attached 360 OU (limited exam, poor pt cooperation)  A/P: Blurry vision OU- pt has HM (hand motion) vision OU, but is able to ambulate and pick up objects with fairly good dexterity, which is inconsistent with HM level of vision, I suspect blurry vision is related to toxic metabolic encephalopathy, findings do not localize and are not c/w a stroke and symptoms do not seem c/w posterior reversible encephalopathy syndrome/PRES. Recommend f/u with regular eye doctor locally 1-2wks after discharge from hospital (pt and husband note [facility] will schedule appt accordingly) but suspect vision to improve as toxic metabolic encephalopathy resolves.   PaAlroy DustMD 5:27pm 03/09/2022

## 2022-03-09 NOTE — Progress Notes (Signed)
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 Benson Norway consulted ERCP stent placement 10/9-post ERCP pancreatitis with AKI, pain and discomfort placed on pain meds 10/10 pancreatitis resolved but increasing confusion    Hospital-Problem based course  Toxic metabolic encephalopathy 2/2 developing aspiration pneumonia Infectious with potential Stroke Husband states vision is "blurry"--confusion is marginally improved [leading me to believe this was infectious as improve don abx started 10/11] but history doesn't make sense and he states she couldn't see the chair behind her earlier today Get MRI brain--have asked Dr. 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 + covered metal stent placed in CBD 10/8 Postoperative pancreatitis Transaminitis improving bilirubin now 1.2, trends of other LFT improved  Profound hypomagnesemia of 1.0 Hypokalemia  2.8 All labs replaced adequately--recheck mag and Cmet in am  DC HCTZ for now Repeat all labs in the morning and monitor  mentation  HTN Previously on valsartan HCTZ-both have been discontinued Use hydralazine IV for now  DVT prophylaxis: Lovenox Code Status: Full Family Communication: Husband at the bedside updated 10/11 Disposition:  Status is: Inpatient Remains inpatient appropriate because:   Still confused and somnolent and now might have pneumonia   Consultants:  GI  Procedures: None  Antimicrobials: Unasyn started 10/11   Subjective:  Less confused Some concerns from husband at bedside--states patient has  "Blurred vision" and cannot see--[patient not really able to tell me how many fingers i'm holding up] She is able to correctly tell me that this is October.  She gets place, year wrong ["[facility]", "1983"] She is moving around much better--she is less somnolent  Objective: Vitals:   03/08/22 1941 03/08/22 2032 03/09/22 0505 03/09/22 0751  BP: (!) 146/73  138/81 (!) 150/86  Pulse: 79  80 78  Resp:    18  Temp: 99.2 F (37.3 C)  98.6 F (37 C) 98.7 F (37.1 C)  TempSrc: Oral  Oral Oral  SpO2: 94% 95% 93% 95%    Intake/Output Summary (Last 24 hours) at 03/09/2022 1622 Last data filed at 03/09/2022 1500 Gross per 24 hour  Intake 4028.75 ml  Output 402 ml  Net 3626.75 ml    There were no vitals filed for this visit.  Examination:  Awake less disoriented white female no apparent distress No icterus no pallor No flap tremor Abdomen is soft no rebound no guarding "Blurry" when I test vision by direct confrontation She moves all 4 limbs appropriately but is slow to do so   Data Reviewed: personally reviewed   CBC    Component Value Date/Time   WBC 12.8 (H) 03/09/2022 0405   RBC 3.64 (L) 03/09/2022 0405   HGB 9.8 (L) 03/09/2022 0405  HCT 28.2 (L) 03/09/2022 0405   PLT 252 03/09/2022 0405   MCV 77.5 (L) 03/09/2022 0405   MCH 26.9 03/09/2022 0405   MCHC 34.8 03/09/2022 0405   RDW 15.4 03/09/2022 0405      Latest Ref Rng & Units 03/09/2022    4:05 AM  03/08/2022    3:19 AM 03/07/2022    2:40 AM  CMP  Glucose 70 - 99 mg/dL 83  88  87   BUN 8 - 23 mg/dL 30  30  32   Creatinine 0.44 - 1.00 mg/dL 1.43  1.63  1.78   Sodium 135 - 145 mmol/L 135  136  136   Potassium 3.5 - 5.1 mmol/L 4.2  2.8  3.6   Chloride 98 - 111 mmol/L 104  102  103   CO2 22 - 32 mmol/L _0 Calcium 8.9 - 10.3 mg/dL 7.8  8.1  9.0   Total Protein 6.5 - 8.1 g/dL 5.1  4.9  5.8   Total Bilirubin 0.3 - 1.2 mg/dL 1.2  1.7  2.3   Alkaline Phos 38 - 126 U/L 346  438  693   AST 15 - 41 U/L 56  58  176   ALT 0 - 44 U/L 104  120  212      Radiology Studies: DG CHEST PORT 1 VIEW  Result Date: 03/08/2022 CLINICAL DATA:  Acute encephalopathy.  Confusion. EXAM: PORTABLE CHEST 1 VIEW COMPARISON:  None Available. FINDINGS: The cardiac silhouette, mediastinal and hilar contours are within normal limits given the AP projection and portable technique. Asymmetric reticulonodular interstitial lung pattern more pronounced on the right. Findings could be due to chronic interstitial process or atypical/viral pneumonia or MAC. No focal pulmonary infiltrates or pleural effusions. No pulmonary lesions. IMPRESSION: Asymmetric reticulonodular interstitial lung pattern could be due to chronic interstitial process or atypical/viral pneumonia or MAC. Electronically Signed   By: Marijo Sanes M.D.   On: 03/08/2022 08:32     Scheduled Meds:  docusate sodium  100 mg Oral BID   enoxaparin (LOVENOX) injection  30 mg Subcutaneous Q24H   mometasone-formoterol  2 puff Inhalation BID   Continuous Infusions:  ampicillin-sulbactam (UNASYN) IV 3 g (03/09/22 0805)   lactated ringers 1,000 mL with potassium chloride 40 mEq infusion 125 mL/hr at 03/09/22 0941     LOS: 6 days   Time spent: [place], MD [facility] To contact the attending provider between 7A-7P or the covering provider during after hours 7P-7A, please log into the web site [email or web address] and access using  universal [place] password for that web site. If you do not have the password, please call the hospital operator.  03/09/2022, 4:22 PM

## 2022-03-09 NOTE — Evaluation (Signed)
Physical Therapy Evaluation Patient Details Name: Shirley Nichols MRN: [id number] DOB: 1941/04/02 Today's Date: 03/09/2022  History of Present Illness  81 yo female presents to [facility] on 10/6 with elevated liver enzymes, emesis. Pt recently diagnosed with UTI treated with nitrofurantoin. Ultrasound showing dilated gallbladder and CBD concerning for biliary ductal obstruction. CT of the abdomen and pelvis without IV contrast shows stranding around the pancreatic head concerning for possible pancreatic mass. s/p ERCP with stent placement on 10/8. encephalopathic starting 10/10, suspected aspiration PNA in R lung. PMH includes HTN, HLD.  Clinical Impression   Pt presents with generalized weakness, impaired balance, impaired cognition, min difficulty mobilizing, and decreased activity tolerance vs baseline. Pt to benefit from acute PT to address deficits. Pt ambulated hallway distance with steadying assist and no AD, PT feels pt would benefit from RW short-term to help with balance but pt declines. Pt with visual change vs baseline, difficult to test given impaired command following this session. Pt will have assist of husband and daughter at d/c. PT to progress mobility as tolerated, and will continue to follow acutely.         Recommendations for follow up therapy are one component of a multi-disciplinary discharge planning process, led by the attending physician.  Recommendations may be updated based on patient status, additional functional criteria and insurance authorization.  Follow Up Recommendations Home health PT      Assistance Recommended at Discharge Frequent or constant Supervision/Assistance  Patient can return home with the following  A little help with walking and/or transfers;A little help with bathing/dressing/bathroom;Direct supervision/assist for medications management;Assistance with cooking/housework;Help with stairs or ramp for entrance;Assist for transportation    Equipment  Recommendations None recommended by PT  Recommendations for Other Services       Functional Status Assessment Patient has had a recent decline in their functional status and demonstrates the ability to make significant improvements in function in a reasonable and predictable amount of time.     Precautions / Restrictions Precautions Precautions: Fall Restrictions Weight Bearing Restrictions: No      Mobility  Bed Mobility Overal bed mobility: Needs Assistance Bed Mobility: Supine to Sit, Sit to Supine     Supine to sit: Min assist, HOB elevated Sit to supine: Min assist, HOB elevated   General bed mobility comments: assist for LE lift back into bed, trunk elevation when moving supine to sit.    Transfers Overall transfer level: Needs assistance Equipment used: None Transfers: Sit to/from Stand Sit to Stand: Min guard, Min assist           General transfer comment: assist for power up from toilet and cues for hand placement on bathroom rail when rising/sitting    Ambulation/Gait Ambulation/Gait assistance: Min assist Gait Distance (Feet): 200 Feet Assistive device: None Gait Pattern/deviations: Step-through pattern, Decreased stride length, Shuffle, Staggering right Gait velocity: decr     General Gait Details: lists R throughout mobility, min steadying assist especially with directional changes  Stairs            Wheelchair Mobility    Modified Rankin (Stroke Patients Only)       Balance Overall balance assessment: Needs assistance Sitting-balance support: No upper extremity supported Sitting balance-Leahy Scale: Fair     Standing balance support: No upper extremity supported Standing balance-Leahy Scale: Fair  Pertinent Vitals/Pain Pain Assessment Pain Assessment: No/denies pain    Home Living Family/patient expects to be discharged to:: Private residence Living Arrangements: Spouse/significant  other Available Help at Discharge: Family Type of Home: House Home Access: Stairs to enter   Technical brewer of Steps: few   Home Layout: One level Home Equipment: Conservation officer, nature (2 wheels)      Prior Function Prior Level of Function : Independent/Modified Independent             Mobility Comments: per pt's husband, pt is very active and can walk "miles" ADLs Comments: indep     Hand Dominance   Dominant Hand: Right    Extremity/Trunk Assessment   Upper Extremity Assessment Upper Extremity Assessment: Defer to OT evaluation    Lower Extremity Assessment Lower Extremity Assessment: Generalized weakness;Difficult to assess due to impaired cognition    Cervical / Trunk Assessment Cervical / Trunk Assessment: Normal  Communication   Communication: No difficulties  Cognition Arousal/Alertness: Awake/alert Behavior During Therapy: Flat affect Overall Cognitive Status: Impaired/Different from baseline Area of Impairment: Orientation, Attention, Memory, Following commands, Safety/judgement, Awareness, Problem solving                 Orientation Level: Disoriented to, Time, Situation Current Attention Level: Sustained Memory: Decreased short-term memory Following Commands: Follows one step commands inconsistently, Follows one step commands with increased time Safety/Judgement: Decreased awareness of deficits, Decreased awareness of safety Awareness: Intellectual Problem Solving: Slow processing, Decreased initiation, Difficulty sequencing, Requires verbal cues, Requires tactile cues General Comments: pt states the year is 1938, then 8938, able to select 2023 as the current year when given options. Pt does not recall why she is in the hosptial when asked, states "I can't think". Pt demonstrating apraxic-like behavior with functional tasks (i.e. brushing teeth at sink pt picks up and puts down toothpaste before using it, squeezes toothpaste tube without  toothbrush there, attempts to put toothpaste on the side of the toothbrush and states "well how much do I put?"). also struggles to guage sitting on toilet, multiple stop/starts). Pt describing and presenting with vision changes, but difficult to assess for focal neurologic changes given difficulty with command following.        General Comments General comments (skin integrity, edema, etc.): pt having trouble finding objects in room, including toothbrush, tissue box when they are clearly in plain sight. PT attempted to assess visual fields, but struggles to follow commands in order to do so. PT also assesses L vs R eye by covering one eye at a time and getting pt to see how many fingers I was holding up, pt struggled to count my fingers in all fields    Exercises     Assessment/Plan    PT Assessment Patient needs continued PT services  PT Problem List Decreased strength;Decreased mobility;Decreased balance;Decreased knowledge of use of DME;Decreased activity tolerance;Decreased coordination;Decreased safety awareness;Decreased cognition       PT Treatment Interventions DME instruction;Therapeutic activities;Gait training;Therapeutic exercise;Patient/family education;Balance training;Stair training;Functional mobility training;Neuromuscular re-education    PT Goals (Current goals can be found in the Care Plan section)  Acute Rehab PT Goals PT Goal Formulation: With patient Time For Goal Achievement: 03/23/22 Potential to Achieve Goals: Good    Frequency Min 3X/week     Co-evaluation               AM-PAC PT "6 Clicks" Mobility  Outcome Measure Help needed turning from your back to your side while in a flat bed without using  bedrails?: A Little Help needed moving from lying on your back to sitting on the side of a flat bed without using bedrails?: A Little Help needed moving to and from a bed to a chair (including a wheelchair)?: A Little Help needed standing up from a chair  using your arms (e.g., wheelchair or bedside chair)?: A Little Help needed to walk in hospital room?: A Little Help needed climbing 3-5 steps with a railing? : A Little 6 Click Score: 18    End of Session   Activity Tolerance: Patient tolerated treatment well;Patient limited by fatigue Patient left: in bed;with call bell/phone within reach;with bed alarm set;with family/visitor present Nurse Communication: Mobility status PT Visit Diagnosis: Other abnormalities of gait and mobility (R26.89)    Time: 8616-8372 PT Time Calculation (min) (ACUTE ONLY): 32 min   Charges:   PT Evaluation $PT Eval Low Complexity: 1 Low PT Treatments $Gait Training: 8-22 mins       Stacie Glaze, PT DPT Acute Rehabilitation Services Pager [phone]  Office [phone]   Gregory E Ruffin Pyo 03/09/2022, 10:56 AM

## 2022-03-09 NOTE — Evaluation (Signed)
Occupational Therapy Evaluation Patient Details Name: Shirley Nichols MRN: [id number] DOB: 1940/08/18 Today's Date: 03/09/2022   History of Present Illness 81 yo female presents to [facility] on 10/6 with elevated liver enzymes, emesis. Pt recently diagnosed with UTI treated with nitrofurantoin. Ultrasound showing dilated gallbladder and CBD concerning for biliary ductal obstruction. CT of the abdomen and pelvis without IV contrast shows stranding around the pancreatic head concerning for possible pancreatic mass. s/p ERCP with stent placement on 10/8. encephalopathic starting 10/10, suspected aspiration PNA in R lung. PMH includes HTN, HLD.   Clinical Impression   PTA patient independent, driving. Admitted for above and presents with problem list below, including impaired cognition, vision, decreased activity tolerance.  She is oriented to self and place, reports 2022 but able to correct to 2023 with cueing.  She demonstrates ability to follow simple 1 step commands but inconsistently, difficulty with problem solving, awareness, recall.  Vision is very difficulty to assess due to poor cognition and inability to follow multiple step commands. She completes transfers with min assist, ADLs with up to min assist but requires up to max assist verbal cues.  Based on performance today, believe she will best benefit from continued OT services acutely and after dc at [facility] level given 24/7 assistance due to cognitive deficits (needing assist with all ADLs, med mgmt, meals and driving). Will follow.      Recommendations for follow up therapy are one component of a multi-disciplinary discharge planning process, led by the attending physician.  Recommendations may be updated based on patient status, additional functional criteria and insurance authorization.   Follow Up Recommendations  Home health OT    Assistance Recommended at Discharge Frequent or constant Supervision/Assistance  Patient can return home with the  following A little help with walking and/or transfers;A little help with bathing/dressing/bathroom;Assistance with cooking/housework;Direct supervision/assist for medications management;Direct supervision/assist for financial management;Assist for transportation;Help with stairs or ramp for entrance    Functional Status Assessment  Patient has had a recent decline in their functional status and demonstrates the ability to make significant improvements in function in a reasonable and predictable amount of time.  Equipment Recommendations  BSC/3in1    Recommendations for Other Services       Precautions / Restrictions Precautions Precautions: Fall Restrictions Weight Bearing Restrictions: No      Mobility Bed Mobility Overal bed mobility: Needs Assistance Bed Mobility: Supine to Sit, Sit to Supine     Supine to sit: Min guard Sit to supine: Min guard   General bed mobility comments: min guard for safety but no physical assist required    Transfers Overall transfer level: Needs assistance Equipment used: None Transfers: Sit to/from Stand Sit to Stand: Min assist           General transfer comment: to rise and steady at EOB      Balance Overall balance assessment: Needs assistance Sitting-balance support: No upper extremity supported Sitting balance-Leahy Scale: Fair     Standing balance support: No upper extremity supported, During functional activity Standing balance-Leahy Scale: Fair                             ADL either performed or assessed with clinical judgement   ADL Overall ADL's : Needs assistance/impaired   Eating/Feeding Details (indicate cue type and reason): spouse reports yesterday unable to use spoon to feed self, but was able to today Grooming: Wash/dry face;Set up;Standing  Upper Body Dressing : Minimal assistance;Sitting   Lower Body Dressing: Minimal assistance;Sit to/from stand Lower Body Dressing Details  (indicate cue type and reason): but max cueing for cogntiion Toilet Transfer: Minimal assistance Toilet Transfer Details (indicate cue type and reason): simulated at EOB           General ADL Comments: limited mobility, pt reporting "i'm going to sit down" when asked to mobilize     Vision Baseline Vision/History: 1 Wears glasses Ability to See in Adequate Light: 0 Adequate Patient Visual Report: Blurring of vision Vision Assessment?: Yes Eye Alignment: Within Functional Limits Ocular Range of Motion: Within Functional Limits Alignment/Gaze Preference: Within Defined Limits Tracking/Visual Pursuits: Requires cues, head turns, or add eye shifts to track;Decreased smoothness of horizontal tracking Additional Comments: very difficult to assess due to impaired cognition and inability to follow multiple step commands, requires head turns with tracking, when asked to read # of fingers in quadrants pt voicies "I don't see anything".  Able to turn head/eyes to L and R to find hand for "high 5", undershooting on R but not on L.  Unable to locate clock at midline on wall, able to once pointed out but reports blurry. Continue assessment.     Perception     Praxis      Pertinent Vitals/Pain Pain Assessment Pain Assessment: No/denies pain     Hand Dominance Right   Extremity/Trunk Assessment Upper Extremity Assessment Upper Extremity Assessment: Generalized weakness   Lower Extremity Assessment Lower Extremity Assessment: Defer to PT evaluation   Cervical / Trunk Assessment Cervical / Trunk Assessment: Normal   Communication Communication Communication: No difficulties   Cognition Arousal/Alertness: Awake/alert Behavior During Therapy: Flat affect Overall Cognitive Status: Impaired/Different from baseline Area of Impairment: Orientation, Attention, Memory, Following commands, Safety/judgement, Awareness, Problem solving                 Orientation Level: Disoriented to,  Time, Situation Current Attention Level: Sustained Memory: Decreased short-term memory Following Commands: Follows one step commands inconsistently, Follows one step commands with increased time Safety/Judgement: Decreased awareness of deficits, Decreased awareness of safety Awareness: Intellectual Problem Solving: Slow processing, Decreased initiation, Difficulty sequencing, Requires verbal cues, Requires tactile cues General Comments: pt reports 2022, with cueing able to correct to 2023.  She demonstrates poor problem solving and awareness.  She is following simple commands inconsistently, difficulty with multiple step commands, motor planning and sequencing.  Unable to problem solve why she needs to don a sock on her R foot (when she only had a sock on her L foot).  When agreeable to don a sock on the R foot, she initated donning a 2nd sock on her L foot and requires cueing to correct.     General Comments  pt having trouble finding objects in room, including toothbrush, tissue box when they are clearly in plain sight. PT attempted to assess visual fields, but struggles to follow commands in order to do so. PT also assesses L vs R eye by covering one eye at a time and getting pt to see how many fingers I was holding up, pt struggled to count my fingers in all fields    Exercises     Shoulder Instructions      Home Living Family/patient expects to be discharged to:: Private residence Living Arrangements: Spouse/significant other Available Help at Discharge: Family Type of Home: House Home Access: Stairs to enter [employer] of Steps: few   Home Layout: One level  Bathroom Shower/Tub: Tub/shower unit;Walk-in shower   Bathroom Toilet: Standard     Home Equipment: Conservation officer, nature (2 wheels)          Prior Functioning/Environment Prior Level of Function : Independent/Modified Independent;Driving             Mobility Comments: per pt's husband, pt is very  active and can walk "miles" ADLs Comments: indep, driving, independent med mgmt, IADLs        OT Problem List: Decreased strength;Decreased activity tolerance;Decreased knowledge of precautions;Decreased knowledge of use of DME or AE;Decreased safety awareness;Decreased cognition;Decreased coordination;Impaired vision/perception;Impaired balance (sitting and/or standing)      OT Treatment/Interventions: Self-care/ADL training;DME and/or AE instruction;Therapeutic exercise;Visual/perceptual remediation/compensation;Cognitive remediation/compensation;Therapeutic activities;Patient/family education;Balance training    OT Goals(Current goals can be found in the care plan section) Acute Rehab OT Goals Patient Stated Goal: home OT Goal Formulation: With patient Time For Goal Achievement: 03/23/22 Potential to Achieve Goals: Good  OT Frequency: Min 2X/week    Co-evaluation              AM-PAC OT "6 Clicks" Daily Activity     Outcome Measure Help from another person eating meals?: A Little Help from another person taking care of personal grooming?: A Little Help from another person toileting, which includes using toliet, bedpan, or urinal?: A Lot Help from another person bathing (including washing, rinsing, drying)?: A Little Help from another person to put on and taking off regular upper body clothing?: A Little Help from another person to put on and taking off regular lower body clothing?: A Little 6 Click Score: 17   End of Session Nurse Communication: Mobility status  Activity Tolerance: Patient limited by fatigue Patient left: in bed;with call bell/phone within reach;with bed alarm set  OT Visit Diagnosis: Other abnormalities of gait and mobility (R26.89);Other symptoms and signs involving cognitive function;Low vision, both eyes (H54.2)                Time: 8563-1497 OT Time Calculation (min): 22 min Charges:  OT General Charges $OT Visit: 1 Visit OT Evaluation $OT Eval  Moderate Complexity: 1 Mod  Jolaine Artist, OT Acute Rehabilitation Services Office [phone]   Delight Stare 03/09/2022, 1:46 PM

## 2022-03-09 NOTE — Consult Note (Signed)
[facility] NOTE  Patient Care Team: Remi Haggard, FNP as PCP - General (Family Medicine)  ASSESSMENT & PLAN:  Pancreatic cancer I will complete staging with CT imaging of the chest If CT imaging of the chest is unremarkable, potentially she could be surgical candidate However, given her age, poor nutritional status and recent acute renal failure, her performance status is poor and she might not be a good surgical candidate upfront Nevertheless, her husband appreciate inpatient general surgery consult but is leaning towards no upfront surgery due to high risk of postop complications at her current state of health Alternatively, she can also receive upfront neoadjuvant chemotherapy They live approximately 1[place]1/2 miles away from [facility] and he would like referral to be seen at and receive treatment locally I think it is appropriate to complete staging of her chest as well as MRI of the head to rule out metastatic disease prior to discharge  Biliary obstruction, improving Continue IV fluid hydration support  Acute on chronic renal failure Could be the cause of her confusion Continue IV fluid support  Severe protein calorie malnutrition Due to her disease Advance to regular diet per husband request  Goals of care discussion I reviewed prognosis with her husband  Discharge planning I am hopeful she can be discharged this weekend if her labs continue to improve and her confusion resolved  The total time spent in the appointment was 80 minutes encounter with patients including review of chart and various tests results, discussions about plan of care and coordination of care plan   All questions were answered. The patient knows to call the clinic with any problems, questions or concerns. No barriers to learning was detected.  Heath Lark, MD 10/12/20235:35 PM  CHIEF COMPLAINTS/PURPOSE OF CONSULTATION:  Pancreatic cancer, for further evaluation and  management  HISTORY OF PRESENTING ILLNESS:  Shirley Nichols 81 y.o. female is seen at the request by primary service The patient is pleasantly confused and is not able to provide much history.  Her husband, Shirley Nichols is by the bedside The patient has not been feeling well for about a week prior to admission.  According to her husband, the patient was still smoking prior to admission to the hospital She was seen by primary care doctor who noted abnormal liver enzymes and was directed to the emergency department for further evaluation.  On admission, the patient has acute renal failure along with grossly abnormal liver enzymes with signs of biliary tract obstruction She had multiple imaging studies leading to further investigation, ERCP and biopsy Results of biopsy came back malignant.  According to her husband, her confusion is slowly improving.  She was able to eat without difficulties today She denies pain Over the course of the last few days, she received aggressive IV fluid resuscitation After the ERCP, her liver enzymes gradually improves  I have reviewed her chart and materials related to her cancer extensively and collaborated history with the patient. Summary of oncologic history is as follows: Oncology History  Pancreatic cancer ([place])  03/03/2022 Initial Diagnosis   Pancreatic cancer ([place])   03/03/2022 Imaging   CT abdomen and pelvis  Hazy stranding about the pancreatic head suggestive of pancreatitis. This is incompletely evaluated without IV contrast. Dilated gallbladder and common bile duct. MRI/MRCP is recommended to evaluate for choledocholithiasis or obstructing mass.   03/03/2022 Imaging   MRI  Image degradation by motion artifact noted.   3.4 cm masslike density in the pancreatic head, highly suspicious for  pancreatic carcinoma.    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 No evidence of choledocholithiasis.   2.6 cm  left adrenal mass is incompletely characterized, but favors a benign hepatic adenoma.   03/05/2022 Pathology Results   FINAL MICROSCOPIC DIAGNOSIS:  - Malignant cells consistent with adenocarcinoma    03/05/2022 Procedure   ERCP - The major papilla appeared normal. - The upper third of the main bile duct and middle third of the main bile duct were dilated, with a mass causing an obstruction. - A biliary sphincterotomy was performed. - One covered metal stent was placed into the common bile duct. - Cells for cytology obtained in the lower third of the main duct.     MEDICAL HISTORY:  Past Medical History:  Diagnosis Date   Cancer ([place])    skin cancer   Hyperlipidemia    Hypertension     SURGICAL HISTORY: Past Surgical History:  Procedure Laterality Date   ABDOMINAL HYSTERECTOMY     BILIARY BRUSHING  03/05/2022   Procedure: BILIARY BRUSHING;  Surgeon: Carol Ada, MD;  Location: [facility] ENDOSCOPY;  Service: Gastroenterology;;   BILIARY STENT PLACEMENT  03/05/2022   Procedure: BILIARY STENT PLACEMENT;  Surgeon: Carol Ada, MD;  Location: [facility] ENDOSCOPY;  Service: Gastroenterology;;   COLONOSCOPY     02/21/2002 adenomatous polyp, 03/23/2011   COLONOSCOPY W/ POLYPECTOMY  02/2000   TA polyp removed from ascending colon.  performed through [facility] (? affiliate in [place])   COLONOSCOPY WITH PROPOFOL N/A 11/03/2016   Procedure: COLONOSCOPY WITH PROPOFOL;  Surgeon: Manya Silvas, MD;  Location: [facility] ENDOSCOPY;  Service: Endoscopy;  Laterality: N/A;   ERCP N/A 03/05/2022   Procedure: ENDOSCOPIC RETROGRADE CHOLANGIOPANCREATOGRAPHY (ERCP);  Surgeon: Carol Ada, MD;  Location: [facility];  Service: Gastroenterology;  Laterality: N/A;   HEMORROIDECTOMY     TONSILLECTOMY      SOCIAL HISTORY: Social History   Socioeconomic History   Marital status: Married    Spouse name: Not on file   Number of children: Not on file   Years of education: Not on file   Highest education  level: Not on file  Occupational History   Not on file  Tobacco Use   Smoking status: Former   Smokeless tobacco: Never  Substance and Sexual Activity   Alcohol use: No   Drug use: No   Sexual activity: Yes  Other Topics Concern   Not on file  Social History Narrative   Not on file   Social Determinants of Health   Financial Resource Strain: Not on file  Food Insecurity: No Food Insecurity (03/03/2022)   Hunger Vital Sign    Worried About Running Out of Food in the Last Year: Never true    Ran Out of Food in the Last Year: Never true  Transportation Needs: No Transportation Needs (03/03/2022)   PRAPARE - Hydrologist (Medical): No    Lack of Transportation (Non-Medical): No  Physical Activity: Not on file  Stress: Not on file  Social Connections: Not on file  Intimate Partner Violence: Not At Risk (03/03/2022)   Humiliation, Afraid, Rape, and Kick questionnaire    Fear of Current or Ex-Partner: No    Emotionally Abused: No    Physically Abused: No    Sexually Abused: No    FAMILY HISTORY: Family History  Problem Relation Age of Onset   Breast cancer Neg Hx     ALLERGIES:  has  No Known Allergies.  MEDICATIONS:  Current Facility-Administered Medications  Medication Dose Route Frequency Provider Last Rate Last Admin   acetaminophen (TYLENOL) tablet 650 mg  650 mg Oral Q6H PRN Howerter, Justin B, DO   650 mg at 03/06/22 0411   Ampicillin-Sulbactam (UNASYN) 3 g in sodium chloride 0.9 % 100 mL IVPB  3 g Intravenous Q12H Wendee Beavers, RPH 200 mL/hr at 03/09/22 0805 3 g at 03/09/22 0805   docusate sodium (COLACE) capsule 100 mg  100 mg Oral BID Edwin Dada, MD   100 mg at 03/09/22 0803   enoxaparin (LOVENOX) injection 30 mg  30 mg Subcutaneous Q24H Edwin Dada, MD   30 mg at 03/09/22 0028   hydrALAZINE (APRESOLINE) tablet 25 mg  25 mg Oral Q6H PRN Opyd, Ilene Qua, MD   25 mg at 03/06/22 1711   lactated ringers 1,000 mL with  potassium chloride 40 mEq infusion   Intravenous Continuous Nita Sells, MD 125 mL/hr at 03/09/22 0941 New Bag at 03/09/22 0941   mometasone-formoterol (DULERA) 200-5 MCG/ACT inhaler 2 puff  2 puff Inhalation BID Edwin Dada, MD   2 puff at 03/09/22 0924   ondansetron (ZOFRAN) tablet 4 mg  4 mg Oral Q6H PRN Opyd, Ilene Qua, MD       Or   ondansetron (ZOFRAN) injection 4 mg  4 mg Intravenous Q6H PRN Opyd, Ilene Qua, MD        REVIEW OF SYSTEMS:  All other systems were reviewed with the patient and are negative.  PHYSICAL EXAMINATION: ECOG PERFORMANCE STATUS: 1 - Symptomatic but completely ambulatory  Vitals:   03/09/22 0505 03/09/22 0751  BP: 138/81 (!) 150/86  Pulse: 80 78  Resp:  18  Temp: 98.6 F (37 C) 98.7 F (37.1 C)  SpO2: 93% 95%   There were no vitals filed for this visit.  GENERAL:alert, no distress and comfortable SKIN: skin color, texture, turgor are normal, no rashes or significant lesions EYES: normal, conjunctiva are pink and non-injected, sclera clear OROPHARYNX:no exudate, no erythema and lips, buccal mucosa, and tongue normal  NECK: supple, thyroid normal size, non-tender, without nodularity LYMPH:  no palpable lymphadenopathy in the cervical, axillary or inguinal LUNGS: clear to auscultation and percussion with normal breathing effort HEART: regular rate & rhythm and no murmurs and no lower extremity edema ABDOMEN:abdomen soft, non-tender and normal bowel sounds Musculoskeletal:no cyanosis of digits and no clubbing  PSYCH: alert & oriented x 3 with fluent speech NEURO: no focal motor/sensory deficits  LABORATORY DATA:  I have reviewed the data as listed Lab Results  Component Value Date   WBC 12.8 (H) 03/09/2022   HGB 9.8 (L) 03/09/2022   HCT 28.2 (L) 03/09/2022   MCV 77.5 (L) 03/09/2022   PLT 252 03/09/2022   Recent Labs    03/07/22 0240 03/08/22 0319 03/09/22 0405  NA 136 136 135  K 3.6 2.8* 4.2  CL 103 102 104  CO2 20* 23  19*  GLUCOSE 87 88 83  BUN 32* 30* 30*  CREATININE 1.78* 1.63* 1.43*  CALCIUM 9.0 8.1* 7.8*  GFRNONAA 28* 31* 37*  PROT 5.8* 4.9* 5.1*  ALBUMIN 2.3* 1.9* 1.9*  AST 176* 58* 56*  ALT 212* 120* 104*  ALKPHOS 693* 438* 346*  BILITOT 2.3* 1.7* 1.2    RADIOGRAPHIC STUDIES: I have personally reviewed the radiological images as listed and agreed with the findings in the report. DG CHEST PORT 1 VIEW  Result Date: 03/08/2022 CLINICAL DATA:  Acute encephalopathy.  Confusion. EXAM: PORTABLE CHEST 1 VIEW COMPARISON:  None Available. FINDINGS: The cardiac silhouette, mediastinal and hilar contours are within normal limits given the AP projection and portable technique. Asymmetric reticulonodular interstitial lung pattern more pronounced on the right. Findings could be due to chronic interstitial process or atypical/viral pneumonia or MAC. No focal pulmonary infiltrates or pleural effusions. No pulmonary lesions. IMPRESSION: Asymmetric reticulonodular interstitial lung pattern could be due to chronic interstitial process or atypical/viral pneumonia or MAC. Electronically Signed   By: Marijo Sanes M.D.   On: 03/08/2022 08:32   DG ERCP  Result Date: 03/06/2022 CLINICAL DATA:  Pancreatic mass, malignant biliary obstruction EXAM: ERCP TECHNIQUE: Multiple spot images obtained with the fluoroscopic device and submitted for interpretation post-procedure. FLUOROSCOPY: Radiation Exposure Index (as provided by the fluoroscopic device): 74.051 mGy Kerma COMPARISON:  None Available. FINDINGS: Six fluoroscopic intraoperative radiographs are presented for interpretation postprocedurally. Initial image demonstrates wire cannulation of the pancreatic duct and subsequent retrograde wire cannulation of the common duct. Contrast installation demonstrates marked extrahepatic biliary ductal dilation with a roughly 4-5 cm stricture involving the distal common duct. Subsequent images demonstrate palliative biliary stenting of  the distal extrahepatic duct with partial evacuation of biliary contrast. The intrahepatic biliary tree is not well opacified on this examination. No extraluminal extension of contrast identified. 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 Subsequent palliative biliary stenting of the stricture. These images were submitted for radiologic interpretation only. Please see the procedural report for the amount of contrast and the fluoroscopy time utilized. Electronically Signed   By: Fidela Salisbury M.D.   On: 03/06/2022 04:17   DG C-Arm 1-60 Min-No Report  Result Date: 03/05/2022 Fluoroscopy was utilized by the requesting physician.  No radiographic interpretation.   DG C-Arm 1-60 Min-No Report  Result Date: 03/05/2022 Fluoroscopy was utilized by the requesting physician.  No radiographic interpretation.   US RENAL  Result Date: 03/04/2022 CLINICAL DATA:  Renal dysfunction EXAM: RENAL / URINARY TRACT ULTRASOUND COMPLETE COMPARISON:  CT done on 03/03/2022 FINDINGS: Right Kidney: Renal measurements: 10 x 4.5 x 5.4 cm = volume: 127.2 mL. There is no hydronephrosis. There is a 1.7 cm cyst in the medial aspect of right kidney. There is trace amount of free fluid adjacent to the right kidney. Left Kidney: Renal measurements: 11.5 x 6.2 x 5.2 cm = volume: 193 x 9 mL. There is no hydronephrosis. Technologist observed a 2.6 cm structure with focal bulge in the cortical margin in the midportion of left kidney. In MRI done on 03/03/2022, no definite significant focal abnormalities were noted in the left kidney. Bladder: Appears normal for degree of bladder distention. Other: None. IMPRESSION: There is no hydronephrosis. Minimal right perinephric fluid. Right renal cyst. There is focal bulge in cortical margin in the midportion of left kidney, possibly partial volume averaging artifact. In previous MR done  on 03/03/2022, no significant focal left renal lesion was seen. Electronically Signed   By: Elmer Picker M.D.   On: 03/04/2022 14:44   MR ABDOMEN MRCP W WO CONTAST  Result Date: 03/03/2022 CLINICAL DATA:  Jaundice. Pancreatitis. Biliary ductal dilatation. Evaluate for choledocholithiasis or pancreatic mass. EXAM: MRI ABDOMEN WITHOUT AND WITH CONTRAST (INCLUDING MRCP) TECHNIQUE: Multiplanar multisequence MR imaging of the abdomen was performed both before and after the administration of intravenous contrast. Heavily T2-weighted images of the biliary and pancreatic ducts were obtained, and three-dimensional MRCP images  were rendered by post processing. CONTRAST:  18m GADAVIST GADOBUTROL 1 MMOL/ML IV SOLN COMPARISON:  Noncontrast CT on 03/03/2022 FINDINGS: Lower chest: No acute findings. Hepatobiliary: Image degradation by motion artifact noted. No hepatic masses identified. Gallbladder is distended, however there are no other findings of acute cholecystitis. Diffuse biliary ductal dilatation is seen, measuring 15 mm in diameter. Stricture of the distal common bile duct is seen in the pancreatic head. No evidence of choledocholithiasis. Pancreas: Image degradation by motion artifact noted. Mild ductal dilatation is seen in the pancreatic tail and body. A heterogeneously enhancing masslike density is seen in the pancreatic head, measuring 3.4 x 2.8 cm, highly suspicious for pancreatic carcinoma. Spleen:  Within normal limits in size and appearance. Adrenals/Urinary Tract: A 2.6 x 2.3 cm left adrenal mass is seen, with evaluation limited by motion artifact. This appears homogeneous with mild T2 hypointensity, favoring a benign adrenal adenoma. No suspicious renal masses identified. No evidence of hydronephrosis. Stomach/Bowel: Unremarkable. Vascular/Lymphatic: No pathologically enlarged lymph nodes identified. No acute vascular findings. Other:  None. Musculoskeletal:  No suspicious bone lesions identified.  IMPRESSION: Image degradation by motion artifact noted. 3.4 cm masslike density in the pancreatic head, highly suspicious for pancreatic carcinoma. 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 No evidence of choledocholithiasis. 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 Electronically Signed   By: JMarlaine HindM.D.   On: 03/03/2022 19:05   CT Abdomen Pelvis Wo Contrast  Result Date: 03/03/2022 CLINICAL DATA:  Elevated lipase; distended gallbladder and CBD on ultrasound earlier today; pancreatitis suspected with concern for obstructing mass EXAM: CT ABDOMEN AND PELVIS WITHOUT CONTRAST TECHNIQUE: Multidetector CT imaging of the abdomen and pelvis was performed following the standard protocol without IV contrast. RADIATION DOSE REDUCTION: This exam was performed according to the departmental dose-optimization program which includes automated exposure control, adjustment of the mA and/or kV according to patient size and/or use of iterative reconstruction technique. COMPARISON:  Ultrasound earlier today FINDINGS: Lower chest: Coronary artery calcification.  No acute abnormality. Hepatobiliary: Distended gallbladder and common bile duct which measures approximately 11 mm in greatest dimension. No radiopaque Tagle. No suspicious hepatic lesion on noncontrast exam. Pancreas: No definite pancreatic duct dilation. There is mild haziness in the peripancreatic fat about the pancreatic head and uncinate process (series 2/image [id number]). Spleen: Normal in size without focal abnormality. Adrenals/Urinary Tract: Nodular low-density (< 10 HU) thickening of the left adrenal gland. Symmetric perinephric stranding. Bilateral focal cortical renal scarring. Low-attenuation lesions in the kidneys are statistically likely to represent cysts. No follow-up is required. Stomach/Bowel: Unremarkable stomach. Normal caliber large and small  bowel. The appendix is not visualized. Vascular/Lymphatic: Aortic atherosclerosis. No enlarged abdominal or pelvic lymph nodes. Reproductive: Hysterectomy. Other: No free intraperitoneal fluid or air. Musculoskeletal: Degenerative changes pubic symphysis, both hips, SI joints and lower lumbar spine. Anterolisthesis L4 on L5. No acute osseous abnormality. IMPRESSION: Hazy stranding about the pancreatic head suggestive of pancreatitis. This is incompletely evaluated without IV contrast. Dilated gallbladder and common bile duct. MRI/MRCP is recommended to evaluate for choledocholithiasis or obstructing mass. Electronically Signed   By: TPlacido SouM.D.   On: 03/03/2022 14:33   UKoreaAbdomen Limited RUQ (LIVER/GB)  Result Date: 03/03/2022 CLINICAL DATA:  Transaminitis EXAM: ULTRASOUND ABDOMEN LIMITED RIGHT UPPER QUADRANT COMPARISON:  None Available. FINDINGS: Gallbladder: There is layering sludge in the distended gallbladder lumen but no shadowing stones. There is no gallbladder wall  thickening or pericholecystic fluid. Sonographic Murphy's sign was reported negative. Common bile duct: Diameter: 1 cm.  There is intrahepatic biliary ductal dilatation. Liver: No focal lesion identified. Within normal limits in parenchymal echogenicity. Portal vein is patent on color Doppler imaging with normal direction of blood flow towards the liver. Other: None. IMPRESSION: 1. Distended gallbladder with layering sludge but no shadowing stones or other evidence of acute cholecystitis. 2. Intra and extrahepatic biliary ductal dilatation. This may be due to choledocholithiasis or an obstructing mass. Recommend MRI/MRCP for further evaluation. Electronically Signed   By: Valetta Mole M.D.   On: 03/03/2022 13:22

## 2022-03-09 NOTE — Progress Notes (Signed)
Patient seen resting in bed remains confused,oriented to self. Pt is  pleasant and more interactive than night before. While speaking to her she noticed to be looking away, unable to focus. Instructed to look directly at the person speaking to her but was unable to do so as she continued to  look in another direction. Pt is able to see light. As she states please turn the lights off. Her confusion does affect her ability to follow commands so that could be related to her response as described above. Will continue to monitor. Doctors from day shift is already aware of confusion and document will do a CT if confusion continues.

## 2022-03-10 ENCOUNTER — Inpatient Hospital Stay (HOSPITAL_COMMUNITY): Payer: PPO

## 2022-03-10 DIAGNOSIS — G40001 Localization-related (focal) (partial) idiopathic epilepsy and epileptic syndromes with seizures of localized onset, not intractable, with status epilepticus: Secondary | ICD-10-CM | POA: Diagnosis not present

## 2022-03-10 DIAGNOSIS — C250 Malignant neoplasm of head of pancreas: Secondary | ICD-10-CM | POA: Diagnosis not present

## 2022-03-10 LAB — CANCER ANTIGEN 19-9
CA 19-9: 482 U/mL — ABNORMAL HIGH (ref 0–35)
CA 19-9: 405 U/mL — ABNORMAL HIGH (ref 0–35)

## 2022-03-10 LAB — CBC
HCT: 27.7 % — ABNORMAL LOW (ref 36.0–46.0)
Hemoglobin: 9.5 g/dL — ABNORMAL LOW (ref 12.0–15.0)
MCH: 26.9 pg (ref 26.0–34.0)
MCHC: 34.3 g/dL (ref 30.0–36.0)
MCV: 78.5 fL — ABNORMAL LOW (ref 80.0–100.0)
Platelets: 260 10*3/uL (ref 150–400)
RBC: 3.53 MIL/uL — ABNORMAL LOW (ref 3.87–5.11)
RDW: 15.7 % — ABNORMAL HIGH (ref 11.5–15.5)
WBC: 10.9 10*3/uL — ABNORMAL HIGH (ref 4.0–10.5)
nRBC: 0 % (ref 0.0–0.2)

## 2022-03-10 LAB — COMPREHENSIVE METABOLIC PANEL
ALT: 95 U/L — ABNORMAL HIGH (ref 0–44)
AST: 57 U/L — ABNORMAL HIGH (ref 15–41)
Albumin: 1.9 g/dL — ABNORMAL LOW (ref 3.5–5.0)
Alkaline Phosphatase: 289 U/L — ABNORMAL HIGH (ref 38–126)
Anion gap: 9 (ref 5–15)
BUN: 30 mg/dL — ABNORMAL HIGH (ref 8–23)
CO2: 21 mmol/L — ABNORMAL LOW (ref 22–32)
Calcium: 7.8 mg/dL — ABNORMAL LOW (ref 8.9–10.3)
Chloride: 110 mmol/L (ref 98–111)
Creatinine, Ser: 1.36 mg/dL — ABNORMAL HIGH (ref 0.44–1.00)
GFR, Estimated: 39 mL/min — ABNORMAL LOW (ref >60)
Glucose, Bld: 100 mg/dL — ABNORMAL HIGH (ref 70–99)
Potassium: 4.6 mmol/L (ref 3.5–5.1)
Sodium: 140 mmol/L (ref 135–145)
Total Bilirubin: 1.2 mg/dL (ref 0.3–1.2)
Total Protein: 5.1 g/dL — ABNORMAL LOW (ref 6.5–8.1)

## 2022-03-10 LAB — GLUCOSE, CAPILLARY
Glucose-Capillary: 89 mg/dL (ref 70–99)
Glucose-Capillary: 98 mg/dL (ref 70–99)

## 2022-03-10 LAB — MAGNESIUM
Magnesium: 1.5 mg/dL — ABNORMAL LOW (ref 1.7–2.4)
Magnesium: 1.6 mg/dL — ABNORMAL LOW (ref 1.7–2.4)

## 2022-03-10 LAB — MRSA NEXT GEN BY PCR, NASAL: MRSA by PCR Next Gen: NOT DETECTED

## 2022-03-10 MED ORDER — LORAZEPAM 2 MG/ML IJ SOLN
INTRAMUSCULAR | Status: AC
  Administered 2022-03-10: 2 mg
  Filled 2022-03-10: qty 1

## 2022-03-10 MED ORDER — LORAZEPAM 2 MG/ML IJ SOLN
2.0000 mg | INTRAMUSCULAR | Status: DC | PRN
  Administered 2022-03-11: 2 mg via INTRAVENOUS
  Filled 2022-03-10 (×4): qty 1

## 2022-03-10 MED ORDER — SODIUM CHLORIDE 0.9 % IV SOLN
3.0000 g | Freq: Two times a day (BID) | INTRAVENOUS | Status: DC
  Administered 2022-03-10 – 2022-03-12 (×4): 3 g via INTRAVENOUS
  Filled 2022-03-10 (×5): qty 8

## 2022-03-10 MED ORDER — LORAZEPAM 2 MG/ML IJ SOLN: 2.0000 mg | INTRAMUSCULAR | Status: DC

## 2022-03-10 MED ORDER — ARFORMOTEROL TARTRATE 15 MCG/2ML IN NEBU
15.0000 ug | INHALATION_SOLUTION | Freq: Two times a day (BID) | RESPIRATORY_TRACT | Status: DC
  Administered 2022-03-11 – 2022-03-23 (×23): 15 ug via RESPIRATORY_TRACT
  Filled 2022-03-10 (×26): qty 2

## 2022-03-10 MED ORDER — BUDESONIDE 0.25 MG/2ML IN SUSP
0.2500 mg | Freq: Two times a day (BID) | RESPIRATORY_TRACT | Status: DC
  Administered 2022-03-11 – 2022-03-23 (×23): 0.25 mg via RESPIRATORY_TRACT
  Filled 2022-03-10 (×24): qty 2

## 2022-03-10 MED ORDER — ORAL CARE MOUTH RINSE
15.0000 mL | OROMUCOSAL | Status: DC
  Administered 2022-03-10 – 2022-03-14 (×17): 15 mL via OROMUCOSAL

## 2022-03-10 MED ORDER — LORAZEPAM 2 MG/ML IJ SOLN
INTRAMUSCULAR | Status: AC
  Filled 2022-03-10: qty 2

## 2022-03-10 MED ORDER — ALPRAZOLAM 0.5 MG PO TABS: 0.5000 mg | ORAL_TABLET | Freq: Two times a day (BID) | ORAL | Status: DC

## 2022-03-10 MED ORDER — MAGNESIUM SULFATE 2 GM/50ML IV SOLN
2.0000 g | Freq: Once | INTRAVENOUS | Status: AC
  Administered 2022-03-10: 2 g via INTRAVENOUS
  Filled 2022-03-10: qty 50

## 2022-03-10 MED ORDER — LEVETIRACETAM IN NACL 500 MG/100ML IV SOLN
500.0000 mg | Freq: Two times a day (BID) | INTRAVENOUS | Status: DC
  Administered 2022-03-11 – 2022-03-12 (×3): 500 mg via INTRAVENOUS
  Filled 2022-03-10 (×6): qty 100

## 2022-03-10 MED ORDER — ORAL CARE MOUTH RINSE: 15.0000 mL | OROMUCOSAL | Status: DC | PRN

## 2022-03-10 MED ORDER — HYDRALAZINE HCL 20 MG/ML IJ SOLN
10.0000 mg | INTRAMUSCULAR | Status: DC | PRN
  Administered 2022-03-12: 10 mg via INTRAVENOUS
  Administered 2022-03-14 (×2): 20 mg via INTRAVENOUS
  Filled 2022-03-10 (×3): qty 1

## 2022-03-10 MED ORDER — CHLORHEXIDINE GLUCONATE CLOTH 2 % EX PADS
6.0000 | MEDICATED_PAD | Freq: Every day | CUTANEOUS | Status: DC
  Administered 2022-03-10 – 2022-03-14 (×6): 6 via TOPICAL

## 2022-03-10 MED ORDER — GADOBUTROL 1 MMOL/ML IV SOLN
7.0000 mL | Freq: Once | INTRAVENOUS | Status: AC | PRN
  Administered 2022-03-10: 7 mL via INTRAVENOUS

## 2022-03-10 MED ORDER — AMOXICILLIN-POT CLAVULANATE 875-125 MG PO TABS
1.0000 | ORAL_TABLET | Freq: Two times a day (BID) | ORAL | Status: DC
  Administered 2022-03-10: 1 via ORAL
  Filled 2022-03-10: qty 1

## 2022-03-10 MED ORDER — LORAZEPAM 2 MG/ML IJ SOLN
INTRAMUSCULAR | Status: AC
  Administered 2022-03-10: 4 mg
  Filled 2022-03-10: qty 2

## 2022-03-10 MED ORDER — LEVETIRACETAM IN NACL 1000 MG/100ML IV SOLN
1000.0000 mg | INTRAVENOUS | Status: AC
  Administered 2022-03-10 (×4): 1000 mg via INTRAVENOUS
  Filled 2022-03-10 (×4): qty 100

## 2022-03-10 MED ORDER — DEXTROSE 5 % IV SOLN: INTRAVENOUS | Status: DC

## 2022-03-10 NOTE — Progress Notes (Signed)
Occupational Therapy Treatment Patient Details Name: Shirley Nichols MRN: [id number] DOB: September 04, 1940 Today's Date: 03/10/2022   History of present illness 81 yo female presents to [facility] on 10/6 with elevated liver enzymes, emesis. Pt recently diagnosed with UTI treated with nitrofurantoin. Ultrasound showing dilated gallbladder and CBD concerning for biliary ductal obstruction. CT of the abdomen and pelvis without IV contrast shows stranding around the pancreatic head concerning for possible pancreatic mass. s/p ERCP with stent placement on 10/8. encephalopathic starting 10/10, suspected aspiration PNA in R lung. PMH includes HTN, HLD.   OT comments  Pt supine in bed, increased confusion compared to yesterday. She remains limited by confusion, vision, weakness.  Questionable L inattention, pt with R head turn preference, and when asked to exit bed on L side (where therapist was) she turned to R to go over the rail, required physical assist to bring legs to L.  Pt remains disoriented, and not following simple 1 step commands today.  Reaching to grab items on bed or in space in front of her, but unable to voice what she is trying to get.  Attempting to voice her needs at end of session, but unable to complete full sentence.  She did stand at EOB with mod assist, but requires max assist to return to bed.  Spouse at side voicing "This is the worst its been", RN present and aware as well (notified MD).  Will follow acutely, with hopes for [facility] but may need SNF if cognition does not clear.    Recommendations for follow up therapy are one component of a multi-disciplinary discharge planning process, led by the attending physician.  Recommendations may be updated based on patient status, additional functional criteria and insurance authorization.    Follow Up Recommendations  Home health OT (hopeful for [facility], but may need SNF if not progressing)    Assistance Recommended at Discharge Frequent or constant  Supervision/Assistance  Patient can return home with the following  Assistance with cooking/housework;Direct supervision/assist for medications management;Direct supervision/assist for financial management;Assist for transportation;Help with stairs or ramp for entrance;A lot of help with bathing/dressing/bathroom;A lot of help with walking and/or transfers;Assistance with feeding   Equipment Recommendations  BSC/3in1    Recommendations for Other Services      Precautions / Restrictions Precautions Precautions: Fall Restrictions Weight Bearing Restrictions: No       Mobility Bed Mobility Overal bed mobility: Needs Assistance Bed Mobility: Supine to Sit, Sit to Supine     Supine to sit: Max assist, HOB elevated Sit to supine: Max assist, HOB elevated   General bed mobility comments: pt attempting to exit bed on R side (over rail, away from therapist and cues), physical assist to bring LEs to L side and not following commands.    Transfers Overall transfer level: Needs assistance Equipment used: None Transfers: Sit to/from Stand Sit to Stand: Mod assist           General transfer comment: face to face to transition into standing with increased time and cueing     Balance Overall balance assessment: Needs assistance Sitting-balance support: No upper extremity supported Sitting balance-Leahy Scale: Fair     Standing balance support: Bilateral upper extremity supported, During functional activity Standing balance-Leahy Scale: Poor Standing balance comment: relies on external support                           ADL either performed or assessed with clinical judgement   ADL  Overall ADL's : Needs assistance/impaired                                       General ADL Comments: limited by cognition, transitioned to EOB and became very cognitively "stuck" voicing "I need something"    Extremity/Trunk Assessment Upper Extremity Assessment Upper  Extremity Assessment: Generalized weakness            Vision   Additional Comments: remains difficult to assess due to cognition, appears to overshoot and reach for items that are not present.  Will continue to assess.   Perception     Praxis      Cognition Arousal/Alertness: Awake/alert Behavior During Therapy: Flat affect Overall Cognitive Status: Impaired/Different from baseline Area of Impairment: Orientation, Attention, Memory, Following commands, Safety/judgement, Awareness, Problem solving                 Orientation Level: Disoriented to, Place, Time, Situation Current Attention Level: Sustained Memory: Decreased short-term memory, Decreased recall of precautions Following Commands: Follows one step commands inconsistently, Follows one step commands with increased time Safety/Judgement: Decreased awareness of safety, Decreased awareness of deficits Awareness: Intellectual Problem Solving: Slow processing, Decreased initiation, Difficulty sequencing, Requires verbal cues, Requires tactile cues General Comments: pt reports 2001, corrected to 2023; reports febuary then spouse voiced "stop joking what month is it" and pt corrected to october.  She initally reports [facility], but then reports [place]. She has very poor awareness, is not following commands today.  Appears to have some L inattention, gaze preference to R.        Exercises      Shoulder Instructions       General Comments spouse present and reports "This is the worst shes been".  RN present and aware- notified MD.    Pertinent Vitals/ Pain       Pain Assessment Pain Assessment: Faces Faces Pain Scale: Hurts little more Pain Location: headache Pain Descriptors / Indicators: Headache Pain Intervention(s): Limited activity within patient's tolerance, Monitored during session, RN gave pain meds during session  Home Living                                          Prior  Functioning/Environment              Frequency  Min 2X/week        Progress Toward Goals  OT Goals(current goals can now be found in the care plan section)  Progress towards OT goals: Not progressing toward goals - comment (increased confusion)  Acute Rehab OT Goals Patient Stated Goal: none stated OT Goal Formulation: Patient unable to participate in goal setting Time For Goal Achievement: 03/23/22 Potential to Achieve Goals: [place] Frequency remains appropriate;Discharge plan remains appropriate    Co-evaluation                 AM-PAC OT "6 Clicks" Daily Activity     Outcome Measure   Help from another person eating meals?: A Lot Help from another person taking care of personal grooming?: A Lot Help from another person toileting, which includes using toliet, bedpan, or urinal?: A Lot Help from another person bathing (including washing, rinsing, drying)?: A Lot Help from another person to put on and taking off regular upper body clothing?: A Lot  Help from another person to put on and taking off regular lower body clothing?: A Lot 6 Click Score: 12    End of Session    OT Visit Diagnosis: Other abnormalities of gait and mobility (R26.89);Other symptoms and signs involving cognitive function;Low vision, both eyes (H54.2)   Activity Tolerance Other (comment);Patient limited by fatigue (cognition)   Patient Left in bed;with call bell/phone within reach;with bed alarm set;with nursing/sitter in room;with family/visitor present   Nurse Communication Mobility status        Time: 3614-4315 OT Time Calculation (min): 23 min  Charges: OT General Charges $OT Visit: 1 Visit OT Treatments $Self Care/Home Management : 23-37 mins  [place] Office [phone]   Delight Stare 03/10/2022, 2:00 PM

## 2022-03-10 NOTE — Progress Notes (Signed)
Patient's mental status started declining around 1 this afternoon. Husband and OT noticed she was very confused. Husband of the patient was very worried and noticed she was not acting the same. I went to do my assessment on patient and I did notice patient was definitely showing changes of her mental status from earlier this morning. Patient was unable to follow commands, she was unable to stand up like she was from earlier, she was unable to speak form sentences. I contacted doctor around 1;15 pm patients status change. He gave me orders to bladder scan patient and let him know. At 2:52 pm I let patient know that we bladder scanned her and she was empty. I also made doctor aware that her status was still declining and husband wanted to see doctor at bedside to assess patient. Doctor then arrived at bedside around 4:30 pm to assess patient. He gave me orders to hang fluids and give ativan. Patient was still showing signs of AMS. CT came at about 6;20 pm and patient was having tremors and a gaze. She was unresponsive and not following command. Rapid response was immediatly paged. Took patient to CT and critical care took over from there. Patient was brought back up after CT scan and is being transferred off the floor to ICU.

## 2022-03-10 NOTE — Progress Notes (Signed)
Patient seen resting in bed no complaints voiced remains confused able to sate name but states year is [birth year]. Vision is still blurry. Pt Reoriented. Vital signs stable. Observation continues.

## 2022-03-10 NOTE — Consult Note (Signed)
Neurology Consultation Reason for Consult: Seizures Requesting Physician: Verneita Griffes  CC: Seizures  History is obtained from:   HPI: Shirley Nichols is a 81 y.o. female with a past medical history significant for hypertension and hyperlipidemia as well as recently diagnosed pancreatic adenocarcinoma  She initially presented with elevated liver enzymes, with MRCP revealing a pancreatic head mass.  She has also been having fatigue and weight loss since August as well as dark urine.  She began to have increasing confusion on the 10th initially attributed to pain medications and felt to be toxic/metabolic encephalopathy.  Ophthalmology evaluation for lower vision was unrevealing.  Course has also been complicated by acute renal failure and severe hypomagnesemia both of which have been improving.  MRI brain was obtained to rule out metastatic disease  Patient had had MRI brain earlier in the day to investigate altered mental status with no acute abnormalities found.  Husband at bedside noted right leg tremoring after the MRI and they also noted that she was talking gibberish all afternoon for 4 to 5 hours  At approximately 1830, she was noted to have right arm and leg jerking with left gaze deviation for which code stroke was called.  8 mg of lorazepam was given (2 mg followed by 2 mg followed by 4 mg) and loading dose of 4 g of Keppra was initiated with cessation of seizures.    Tuesday also had an episode of confusion  They deny any episodes at home of tongue bites or unexplained incontinence although she has incontinence at baseline  Seizure risk factors: No head trauma  No meningitis / encephalitis  No personal history of stroke No personal history of seizures Brother had a stroke / seizure at age 64 with facial numbness/weakness  Sister with partial temporal lobe seizures, worst one caused by trigeminal nerve pain Grandson with seizure (in his 48s)    ROS: Unable to obtain due to  altered mental status.   Past Medical History:  Diagnosis Date   Cancer ([place])    skin cancer   Hyperlipidemia    Hypertension    Past Surgical History:  Procedure Laterality Date   ABDOMINAL HYSTERECTOMY     BILIARY BRUSHING  03/05/2022   Procedure: BILIARY BRUSHING;  Surgeon: Carol Ada, MD;  Location: [facility] ENDOSCOPY;  Service: Gastroenterology;;   BILIARY STENT PLACEMENT  03/05/2022   Procedure: BILIARY STENT PLACEMENT;  Surgeon: Carol Ada, MD;  Location: [facility] ENDOSCOPY;  Service: Gastroenterology;;   COLONOSCOPY     02/21/2002 adenomatous polyp, 03/23/2011   COLONOSCOPY W/ POLYPECTOMY  02/2000   TA polyp removed from ascending colon.  performed through [facility] (? affiliate in [place])   COLONOSCOPY WITH PROPOFOL N/A 11/03/2016   Procedure: COLONOSCOPY WITH PROPOFOL;  Surgeon: Manya Silvas, MD;  Location: [facility] ENDOSCOPY;  Service: Endoscopy;  Laterality: N/A;   ERCP N/A 03/05/2022   Procedure: ENDOSCOPIC RETROGRADE CHOLANGIOPANCREATOGRAPHY (ERCP);  Surgeon: Carol Ada, MD;  Location: [place];  Service: Gastroenterology;  Laterality: N/A;   HEMORROIDECTOMY     TONSILLECTOMY       Family History  Problem Relation Age of Onset   Breast cancer Neg Hx   -Grandson with seizures at age 22 -Brother with facial numbness/weakness from an event at age 51  Social History:  reports that she has quit smoking. She has never used smokeless tobacco. She reports that she does not drink alcohol and does not use drugs.  Exam: Current vital signs: BP (!) 184/86 (BP Location: Left Arm)  Pulse (!) 102   Temp 98 F (36.7 C) (Axillary)   Resp 16   SpO2 91%  Vital signs in last 24 hours: Temp:  [97.9 F (36.6 C)-98.6 F (37 C)] 98 F (36.7 C) (10/13 1835) Pulse Rate:  [70-102] 102 (10/13 1835) Resp:  [16-20] 16 (10/13 1835) BP: (132-184)/(72-114) 184/86 (10/13 1835) SpO2:  [91 %-98 %] 91 % (10/13 1835)   Physical Exam  Constitutional: Appears frail Psych: Minimally  interactive Eyes: No scleral injection HENT: No oropharyngeal obstruction.  MSK: no joint deformities.  Cardiovascular: Normal rate and regular rhythm. Respiratory: Effort normal, non-labored breathing GI: Soft.  No distension. There is no tenderness.  Skin: Warm dry and intact visible skin  Neuro: Mental Status: Minimal speech, at one point says yes.  Not following commands Cranial Nerves: II: Left pupil is slightly larger than the right but both are reactive III,IV, VI: Initially left gaze deviation, rhythmic; disconjugate gaze V: Facial sensation is symmetric to temperature VII: Facial movement is symmetric.  VIII: hearing is intact to voice X: Uvula elevates symmetrically XI: Shoulder shrug is symmetric. XII: tongue is midline without atrophy or fasciculations.  Motor: Rhythmic shaking of the right upper extremity, at times spreading to the right lower extremity.  Nonsuppressible.  Postictally she is hypotonic on the right but localizes with the left Sensory: Less briskly reactive to noxious stimulation on the right side    I have reviewed labs in epic and the results pertinent to this consultation are:  Basic Metabolic Panel: Recent Labs  Lab 03/06/22 0014 03/07/22 0240 03/08/22 0319 03/09/22 0405 03/10/22 0407  NA 136 136 136 135 140  K 4.3 3.6 2.8* 4.2 4.6  CL 107 103 102 104 110  CO2 23 20* 23 19* 21*  GLUCOSE 136* 87 88 83 100*  BUN 43* 32* 30* 30* 30*  CREATININE 2.22* 1.78* 1.63* 1.43* 1.36*  CALCIUM 8.3* 9.0 8.1* 7.8* 7.8*  MG  --   --  1.0* 1.8  --     CBC: Recent Labs  Lab 03/04/22 0344 03/06/22 0014 03/08/22 0319 03/09/22 0405 03/10/22 0407  WBC 8.2 8.9 17.4* 12.8* 10.9*  HGB 10.9* 10.4* 9.6* 9.8* 9.5*  HCT 32.8* 32.1* 27.3* 28.2* 27.7*  MCV 79.4* 81.1 76.3* 77.5* 78.5*  PLT 274 244 237 252 260    Coagulation Studies: Recent Labs    03/08/22 0704  LABPROT 14.9  INR 1.2      I have reviewed the images obtained:  MRI brain  personally reviewed, agree with radiology 1. No metastatic disease or acute intracranial abnormality. 2. 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  Given family history of temporal lobe epilepsy this would be the most benign diagnosis, with seizure threshold potentially lowered in the setting of hypomagnesemia and poor sleep due to hospitalization. MRI brain is reassuring against metastatic disease.  However if her seizures are difficult to control may need to consider lumbar puncture for further work-up including possibly flow and cytology to more fully assess for potential leptomeningeal carcinomatosis.   Recommendations: - Given hypomagnesemia, added on magnesium to morning labs; resulted at 1.5 so will supplement with 2 g IV - S/p 8 mg Ativan - Keppra 60 mg/kg (4 g) load, then 500 mg BID. Adjust as needed for renal function:  Estimated Creatinine Clearance: 29.8 mL/min (A) (by C-G formula based on SCr of 1.36 mg/dL (H)).              CrCl 80 to 130 mL/minute/1.73 m2: 500 mg to 1.5 g every 12 hours.             CrCl 50 to <80 mL/minute/1.73 m2: 500 mg to 1 g every 12 hours.             CrCl 30 to <50 mL/minute/1.73 m2: 250 to 750 mg every 12 hours.             CrCl 15 to <30 mL/minute/1.73 m2: 250 to 500 mg every 12 hours.             CrCl <15 mL/minute/1.73 m2: 250 to 500 mg every 24 hours (expert opinion). - EKG for PR interval; given no evidence of heart block would favor Vimpat as the next agent for further seizure activity - LTM EEG - Neurology will follow  [place] [phone] Available 7 AM to 7 PM, outside these hours please contact Neurologist on call listed on AMION   Total critical care time:  60 minutes   Critical care time was exclusive of separately billable procedures and treating other patients.   Critical care was necessary to treat or prevent imminent or life-threatening deterioration.   Critical care was time spent personally by me on the following activities: development of treatment plan with patient and/or surrogate as well as nursing, discussions with consultants/primary team, evaluation of patient's response to treatment, examination of patient, obtaining history from patient or surrogate, ordering and performing treatments and interventions, ordering and review of laboratory studies, ordering and review of radiographic studies, and re-evaluation of patient's condition as needed, as documented above.

## 2022-03-10 NOTE — Progress Notes (Signed)
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 Benson [place] consulted ERCP stent placement 10/9-post ERCP pancreatitis with AKI, pain and discomfort placed on pain meds 10/10 pancreatitis resolved but increasing confusion 10/12 Seen by Optho--unlikely ocular issue realted to visual changes 10/13 Delirious--MRI brain no lesion of casue for visual issues    Hospital-Problem based course  Toxic metabolic encephalopathy 2/2 developing aspiration pneumonia Infectious with potential Stroke MRI brain neg, waxing waning mentation--not concerned for acute infarct--seems to be sundowning Reassured husband at bedside Start Xanax 0.25 bid and observe--not combative Start d5 @ 26 as scant appetite  Aspiration Pneumonia--acquired in hospital Leukocytosis 17-->11 and improved, CXR suggestive of right-sided pathology SLP recommends regular diet, thin liq 10/13  Pancreatic head mass 3.4 cm malignant obstructive jaundice + covered metal stent placed in CBD 10/8 Postoperative pancreatitis Transaminitis improving bilirubin now 1.2, trends of other LFT improved  Profound hypomagnesemia Hypokalemia All repalced Stop IV fluid with K  DC HCTZ for now Repeat all labs in the morning and monitor mentation  HTN Previously  on valsartan HCTZ-both have been discontinued Use hydralazine IV for now  DVT prophylaxis: Lovenox Code Status: Full Family Communication: Husband at the bedside updated 10/11 Disposition:  Status is: Inpatient Remains inpatient appropriate because:   Still confused and somnolent and now might have pneumonia   Consultants:  GI  Procedures: None  Antimicrobials: Unasyn started 10/11   Subjective:  Seen this am was better remembered husband and was ambulatory and funcional Sundowned this pm--talking nonsensically non-stop cannot re-orient   Objective: Vitals:   03/10/22 0741 03/10/22 0801 03/10/22 1148 03/10/22 1607  BP: (!) 180/81  (!) 170/77 (!) 177/82  Pulse: 78 73 79 88  Resp: 18 16 18 18   Temp: 98 F (36.7 C)  97.9 F (36.6 C) 98 F (36.7 C)  TempSrc: Oral  Oral Oral  SpO2: 98% 96% 95% 93%    Intake/Output Summary (Last 24 hours) at 03/10/2022 1659 Last data filed at 03/10/2022 0532 Gross per 24 hour  Intake --  Output 2 ml  Net -2 ml    There were no vitals filed for this visit.  Examination:  No ict no pallor confused Cta b no added sound Abd soft no rebound guard nor pain on deep palpation No le edema Rom intact grossly  Talking non sensically   Data Reviewed: personally reviewed   CBC    Component Value Date/Time   WBC 10.9 (H) 03/10/2022 0407   RBC 3.53 (L) 03/10/2022 0407   HGB 9.5 (L) 03/10/2022 0407   HCT 27.7 (L) 03/10/2022 0407   PLT 260 03/10/2022 0407   MCV 78.5 (L) 03/10/2022 0407   MCH 26.9 03/10/2022 0407   MCHC 34.3 03/10/2022 0407   RDW 15.7 (H) 03/10/2022 0407      Latest Ref Rng & Units 03/10/2022    4:07 AM 03/09/2022  4:05 AM 03/08/2022    3:19 AM  CMP  Glucose 70 - 99 mg/dL 100  83  88   BUN 8 - 23 mg/dL 30  30  30    Creatinine 0.44 - 1.00 mg/dL 1.36  1.43  1.63   Sodium 135 - 145 mmol/L 140  135  136   Potassium 3.5 - 5.1 mmol/L 4.6  4.2  2.8   Chloride 98 - 111 mmol/L 110  104  102   CO2 22 - 32 mmol/L  21  19  23    Calcium 8.9 - 10.3 mg/dL 7.8  7.8  8.1   Total Protein 6.5 - 8.1 g/dL 5.1  5.1  4.9   Total Bilirubin 0.3 - 1.2 mg/dL 1.2  1.2  1.7   Alkaline Phos 38 - 126 U/L 289  346  438   AST 15 - 41 U/L 57  56  58   ALT 0 - 44 U/L 95  104  120      Radiology Studies: MR BRAIN W WO CONTRAST  Result Date: 03/10/2022 CLINICAL DATA:  81 year old female with confusion, TIA. Jaundice, suspected pancreatic head mass. EXAM: MRI HEAD WITHOUT AND WITH CONTRAST TECHNIQUE: Multiplanar, multiecho pulse sequences of the brain and surrounding structures were obtained without and with intravenous contrast. CONTRAST:  1m GADAVIST GADOBUTROL 1 MMOL/ML IV SOLN COMPARISON:  None Available. FINDINGS: Brain: Cerebral volume is within normal limits for age. No restricted diffusion to suggest acute infarction. No midline shift, mass effect, evidence of mass lesion, ventriculomegaly, extra-axial collection or acute intracranial hemorrhage. Cervicomedullary junction and pituitary are within normal limits. 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 Otherwise mild for age patchy nonspecific white matter T2 and FLAIR hyperintensity (series 6, image 23). Occasional subtle chronic microhemorrhages in the brain (left frontal lobe series 7, image 58). Mild T2 heterogeneity in the bilateral basal ganglia and right thalamus. No cortical encephalomalacia. No abnormal enhancement identified. No dural thickening suspected on this 3 Tesla imaging. Vascular: Major intracranial vascular flow voids are preserved. The major dural venous sinuses are enhancing and appear to be patent. Skull and upper cervical spine: Negative for age visible cervical spine. Visualized bone marrow signal is within normal limits. Sinuses/Orbits: Postoperative changes to both globes. Paranasal sinuses and mastoids are well aerated. Other: Grossly normal visible internal auditory structures. Negative visible  scalp and face. IMPRESSION: 1. No metastatic disease or acute intracranial abnormality. 2. 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 Electronically Signed   By: HGenevie AnnM.D.   On: 03/10/2022 12:50     Scheduled Meds:  ALPRAZolam  0.5 mg Oral BID   amoxicillin-clavulanate  1 tablet Oral Q12H   docusate sodium  100 mg Oral BID   enoxaparin (LOVENOX) injection  30 mg Subcutaneous Q24H   mometasone-formoterol  2 puff Inhalation BID   Continuous Infusions:  dextrose 50 mL/hr at 03/10/22 1658     LOS: 7 days   Time spent: [place] MD [facility] To contact the attending provider between 7A-7P or the covering provider during after hours 7P-7A, please log into the web site [email or web address] and access using universal [place] password for that web site. If you do not have the password, please call the hospital operator.  03/10/2022, 4:59 PM

## 2022-03-10 NOTE — Progress Notes (Signed)
eLink Physician-Brief Progress Note Patient Name: Shirley Nichols DOB: 28-Sep-1940 MRN: [id number]   Date of Service  03/10/2022  HPI/Events of Note  Patient transferred to the ICU for cEEG and  closer monitoring s/p seizures treated with several doses of iv Ativan, she also has aspiration pneumonia,  pancreatic adenocarcinoma s/p stent placement, with post-placement pancreatitis. She has been started on Keppra.  eICU Interventions  New Patient Evaluation.        Shirley Nichols 03/10/2022, 9:06 PM

## 2022-03-10 NOTE — Progress Notes (Signed)
Pharmacy Antibiotic Note  Shirley Nichols is a 81 y.o. female on day #3 antibiotics for aspiration pneumonia. Unasyn was changed to Augmentin this morning, now changing back to Unasyn.      AKI on admit, creatinine has trended down.   Plan: Resume Unasyn 3gm IV q12h.   Follow renal function for any need to adjust dosing interval Follow clinical status, antibiotic plans.  Temp (24hrs), Avg:98.2 F (36.8 C), Min:97.9 F (36.6 C), Max:98.6 F (37 C)  Recent Labs  Lab 03/04/22 0344 03/05/22 0649 03/06/22 0014 03/07/22 0240 03/08/22 0319 03/08/22 0932 03/08/22 1232 03/09/22 0405 03/10/22 0407  WBC 8.2  --  8.9  --  17.4*  --   --  12.8* 10.9*  CREATININE 2.31*   < > 2.22* 1.78* 1.63*  --   --  1.43* 1.36*  LATICACIDVEN  --   --   --   --   --  0.8 0.6  --   --    < > = values in this interval not displayed.    Estimated Creatinine Clearance: 29.8 mL/min (A) (by C-G formula based on SCr of 1.36 mg/dL (H)).    No Known Allergies  Antimicrobials this admission: Cipro IV x 1 on 10/8 prior to ERCP Unasyn 10/11 >>  - (dc'd after am dose 10/13, resuming in pm, no doses missed) Augmentin x 1 on 10/13 am  Dose adjustments this admission: n/a  Microbiology results: No cultures  Thank you for allowing pharmacy to be a part of this patient's care.  Arty Baumgartner, [place] 03/10/2022 8:22 PM

## 2022-03-10 NOTE — Evaluation (Signed)
Clinical/Bedside Swallow Evaluation Patient Details  Name: Shirley Nichols MRN: [id number] Date of Birth: [birth year]  Today's Date: 03/10/2022 Time: SLP Start Time (ACUTE ONLY): 1223 SLP Stop Time (ACUTE ONLY): 3662 SLP Time Calculation (min) (ACUTE ONLY): 25 min  Past Medical History:  Past Medical History:  Diagnosis Date   Cancer ([place])    skin cancer   Hyperlipidemia    Hypertension    Past Surgical History:  Past Surgical History:  Procedure Laterality Date   ABDOMINAL HYSTERECTOMY     BILIARY BRUSHING  03/05/2022   Procedure: BILIARY BRUSHING;  Surgeon: Carol Ada, MD;  Location: [facility] ENDOSCOPY;  Service: Gastroenterology;;   BILIARY STENT PLACEMENT  03/05/2022   Procedure: BILIARY STENT PLACEMENT;  Surgeon: Carol Ada, MD;  Location: [facility] ENDOSCOPY;  Service: Gastroenterology;;   COLONOSCOPY     02/21/2002 adenomatous polyp, 03/23/2011   COLONOSCOPY W/ POLYPECTOMY  02/2000   TA polyp removed from ascending colon.  performed through [facility] (? affiliate in [place])   COLONOSCOPY WITH PROPOFOL N/A 11/03/2016   Procedure: COLONOSCOPY WITH PROPOFOL;  Surgeon: Manya Silvas, MD;  Location: [facility] ENDOSCOPY;  Service: Endoscopy;  Laterality: N/A;   ERCP N/A 03/05/2022   Procedure: ENDOSCOPIC RETROGRADE CHOLANGIOPANCREATOGRAPHY (ERCP);  Surgeon: Carol Ada, MD;  Location: [place];  Service: Gastroenterology;  Laterality: N/A;   HEMORROIDECTOMY     TONSILLECTOMY     HPI:  81 yo female presents to [facility] on 10/6 with elevated liver enzymes, emesis. Pt recently diagnosed with UTI treated with nitrofurantoin. Ultrasound showing dilated gallbladder and CBD concerning for biliary ductal obstruction. CT of the abdomen and pelvis without IV contrast shows stranding around the pancreatic head concerning for possible pancreatic mass. s/p ERCP with stent placement on 10/8. Encephalopathic starting 10/10, suspected aspiration PNA in R lung. MRI pending. PMH includes HTN, HLD.     Assessment / Plan / Recommendation  Clinical Impression  Pt presents with grossly functional appearing swallowing as SLP provided assistance as needed for self-feeding. She did have some delayed throat clearing after solids, but she and her husband deny any overt difficulties with solids this admission or PTA except for a single episode of regurgtitation last Wednesday. He said that the pt belched while eating soup and it came back up, but they deny any h/o reflux or any N/V recently. Recommend continuing with regular solids and thin liquids, although SLP did provide educaiton about esophageal precautions and aspiration precautions considering AMS. Further SLP f/u for swallowing may not be indicated pending results of MBS. SLP Visit Diagnosis: Dysphagia, unspecified (R13.10)    Aspiration Risk  Mild aspiration risk    Diet Recommendation Regular;Thin liquid   Liquid Administration via: Cup;Straw Medication Administration: Whole meds with liquid Supervision: Staff to assist with self feeding Compensations: Slow rate;Small sips/bites;Minimize environmental distractions Postural Changes: Seated upright at 90 degrees;Remain upright for at least 30 minutes after po intake    Other  Recommendations Oral Care Recommendations: Oral care BID    Recommendations for follow up therapy are one component of a multi-disciplinary discharge planning process, led by the attending physician.  Recommendations may be updated based on patient status, additional functional criteria and insurance authorization.  Follow up Recommendations Other (comment) (none anticipated for dysphagia - question cognitive needs particularly dependign on MRI results)      Assistance Recommended at Discharge Frequent or constant Supervision/Assistance  Functional Status Assessment Patient has had a recent decline in their functional status and demonstrates the ability to make significant improvements in  function in a reasonable and  predictable amount of time.  Frequency and Duration min 1 x/week  1 week       Prognosis Prognosis for Safe Diet Advancement: Good Barriers to Reach Goals: Cognitive deficits      Swallow Study   General HPI: 81 yo female presents to [facility] on 10/6 with elevated liver enzymes, emesis. Pt recently diagnosed with UTI treated with nitrofurantoin. Ultrasound showing dilated gallbladder and CBD concerning for biliary ductal obstruction. CT of the abdomen and pelvis without IV contrast shows stranding around the pancreatic head concerning for possible pancreatic mass. s/p ERCP with stent placement on 10/8. Encephalopathic starting 10/10, suspected aspiration PNA in R lung. MRI pending. PMH includes HTN, HLD. Type of Study: Bedside Swallow Evaluation Previous Swallow Assessment: none in chart Diet Prior to this Study: Regular;Thin liquids Temperature Spikes Noted: No Respiratory Status: Room air History of Recent Intubation: No Behavior/Cognition: Alert;Cooperative;Requires cueing Oral Cavity Assessment: Within Functional Limits Oral Care Completed by SLP: No Oral Cavity - Dentition: Adequate natural dentition Vision: Impaired for self-feeding Self-Feeding Abilities: Needs assist Patient Positioning: Upright in bed Baseline Vocal Quality: Normal Volitional Cough: Strong Volitional Swallow: Able to elicit    Oral/Motor/Sensory Function Overall Oral Motor/Sensory Function: Within functional limits (not following all commands but appears to be [facility])   [employer] chips: Not tested   Thin Liquid Thin Liquid: Within functional limits Presentation: Self Fed;Straw    Nectar Thick Nectar Thick Liquid: Not tested   Honey Thick Honey Thick Liquid: Not tested   Puree Puree: Within functional limits Presentation: Spoon   Solid     Solid: Impaired Presentation: Self Fed Pharyngeal Phase Impairments: Throat Clearing - Delayed      Osie Bond., M.A. [place] Office  [phone]  Secure chat preferred  03/10/2022,1:45 PM

## 2022-03-10 NOTE — Progress Notes (Signed)
Pt voiced that she has not rested much overnight due to frequent trips to bathroom refused. Encouraged to bedside commode refuses same. Observation continues

## 2022-03-10 NOTE — Progress Notes (Signed)
Pt assisted to b/r voiced that she has sob on assisting her back to bed. Vital signs done same stable, spo2 95 on r/a no wheezing with slightly elevated  bp . On settling back in bed she voiced that she is feeling better . Observation continues.

## 2022-03-10 NOTE — Progress Notes (Signed)
LTM EEG hooked up and running - no initial skin breakdown - push button tested - neuro notified. [facility] monitoring.  

## 2022-03-10 NOTE — TOC Initial Note (Signed)
Transition of Care [facility]) - Initial/Assessment Note    Patient Details  Name: Shirley Nichols MRN: [id number] Date of Birth: 20-Oct-1940  [facility] CM/SW Contact:    Curlene Labrum, RN Phone Number: 03/10/2022, 1:02 PM  Clinical Narrative:                 CM met with the patient and husband at the bedside to discuss transitions of care needs.  The patient has pending evaluations and testing and is not medically stable to go home at this time.  The patient lives at home with her husband, who is currently present at the bedside.  PT/OT evaluated the patient and are recommending home health therapies - patient/husband are agreeable and did not have a preference for company choice.  Home health orders were placed for PT/OT.  Amy Hyatt, CM with [facility] accepted the patient for services - placed in the discharge instructions.  PCP - Follow up with Lavena Bullion, NP.  Transportation - When patient is medically stable for discharge - the patient's husband will be providing transportation to home by car.  Expected Discharge Plan: [place] Barriers to Discharge: Continued Medical Work up   Patient Goals and CMS Choice Patient states their goals for this hospitalization and ongoing recovery are:: To return home CMS Medicare.gov Compare Post Acute Care list provided to:: Patient Represenative (must comment) (Patient's husband) Choice offered to / list presented to : Spouse  Expected Discharge Plan and Services Expected Discharge Plan: [place]   Discharge Planning Services: CM Consult Post Acute Care Choice: [place] arrangements for the past 2 months: Single Family Home                           HH Arranged: PT, OT [place] Agency:  ([facility]) Date HH Agency Contacted: 03/10/22 Time [place]: 1208 Representative spoke with at [place]: Gevena Barre, [place] with [facility] [place]  Prior Living  Arrangements/Services Living arrangements for the past 2 months: [place] with:: Spouse Patient language and need for interpreter reviewed:: Yes Do you feel safe going back to the place where you live?: Yes      Need for Family Participation in Patient Care: Yes (Comment) Care giver support system in place?: Yes (comment)   Criminal Activity/Legal Involvement Pertinent to Current Situation/Hospitalization: No - Comment as needed  Activities of Daily Living Home Assistive Devices/Equipment: None ADL Screening (condition at time of admission) Patient's cognitive ability adequate to safely complete daily activities?: No Is the patient deaf or have difficulty hearing?: No Does the patient have difficulty seeing, even when wearing glasses/contacts?: No Does the patient have difficulty concentrating, remembering, or making decisions?: No Patient able to express need for assistance with ADLs?: Yes Does the patient have difficulty dressing or bathing?: No Independently performs ADLs?: Yes (appropriate for developmental age) Does the patient have difficulty walking or climbing stairs?: No Weakness of Legs: None Weakness of Arms/Hands: None  Permission Sought/Granted Permission sought to share information with : Case Manager, Family Supports, PCP, Chartered certified accountant granted to share information with : Yes, Verbal Permission Granted     Permission granted to share info w AGENCY: Home Health agency  Permission granted to share info w Relationship: husband     Emotional Assessment Appearance:: Appears stated age Attitude/Demeanor/Rapport: Gracious Affect (typically observed): Accepting Orientation: : Oriented to Self, Oriented to Place, Oriented  to  Time Alcohol / Substance Use: Not Applicable Psych Involvement: No (comment)  Admission diagnosis:  Malignant obstructive jaundice (HCC) [K83.1, C80.1] Patient Active Problem List   Diagnosis Date Noted    Post-ERCP acute pancreatitis 03/06/2022   Hyperlipidemia 03/04/2022   Hypokalemia 03/04/2022   Pancreatic cancer ([place]) 03/03/2022   Hypertension    AKI (acute kidney injury) ([place])    PCP:  Remi Haggard, FNP Pharmacy:   [place], [place] - [address] [place] [address] Phone: [phone] Fax: [phone]  [id number] [address] [place]Phone: [phone]: [phone]    Social Determinants of Health (SThatcher Interventions    Readmission Risk Interventions    03/10/2022    1:01 PM  Readmission Risk Prevention Plan  Transportation Screening Complete  PCP or Specialist Appt within 5-7 Days Complete  Home Care Screening Complete  Medication Review (RN CM) Complete

## 2022-03-10 NOTE — Progress Notes (Signed)
Mobility Specialist Progress Note:   03/10/22 1400  Mobility  Activity Contraindicated/medical hold   Pt inappropriate for mobility specialist at this time given level of cognition and ability to follow simple instructions as advised by Occupational Therapy team. Mobility specialist to hold today, will continue to follow for readiness.   Shirley Nichols Mobility Specialist-Acute Rehab Secure Chat only

## 2022-03-10 NOTE — Consult Note (Signed)
NAME:  Shirley Nichols, MRN:  [id number], DOB:  1940/11/16, LOS: 7 ADMISSION DATE:  03/03/2022, CONSULTATION DATE:  03/10/2022  REFERRING MD:  Meredith Mody, CHIEF COMPLAINT:  seizure   History of Present Illness:  81 year old-woman admitted to [facility] 10/6 for elevated liver enzymes and AKI with ultrasound showing CBD obstruction and pancreatic mass confirmed on MRCP.  She was transferred to [facility] and underwent ERCP stent placement on 10/8 with biopsy confirming adenocarcinoma.  She had postprocedure pancreatitis and developed increasing confusion on 10/10 with delirium on 10/13, MRI was negative.  She had witnessed focal right-sided seizure with left gaze preference, head CT was negative for CVA.  She was given 8 mg of Ativan in divided doses, loaded with Keppra and transferred to the ICU for LTM EEG  Pertinent  Medical History  Hypertension Hyperlipidemia Tobacco use for 20 years  Significant Hospital Events: Including procedures, antibiotic start and stop dates in addition to other pertinent events   10/6-transferred to [facility] 10/7 and 10/8-GI Dr. Benson Norway consulted ERCP stent placement 10/9-post ERCP pancreatitis with AKI, pain and discomfort placed on pain meds 10/10 pancreatitis resolved but increasing confusion 10/12 Seen by Optho--unlikely ocular issue realted to visual changes 10/13 Delirious--MRI brain no lesion of casue for visual issues  Interim History / Subjective:  On my arrival, unresponsive Afebrile Rapid response at bedside  Objective   Blood pressure (!) 184/86, pulse (!) 102, temperature 98 F (36.7 C), temperature source Axillary, resp. rate 16, SpO2 91 %.        Intake/Output Summary (Last 24 hours) at 03/10/2022 1947 Last data filed at 03/10/2022 0532 Gross per 24 hour  Intake --  Output 2 ml  Net -2 ml   There were no vitals filed for this visit.  Examination: General: Elderly woman unresponsive to name, no distress HENT: Eyes midline, no  pallor, icterus, no JVD Lungs: Clear lungs, no accessory muscle use Cardiovascular: S1 and S2 regular, no murmur Abdomen: Soft, nontender, Extremities: No edema Neuro: Does not follow commands, localizes with left arm to deep pain stimulus, eyes move to left on stimulation, Babinski upgoing bilateral   Head CT negative Labs show normal electrolytes, decreasing creatinine to 1.3, alk phos decreasing to 289, bilirubin 1.2 ammonia 33 on 10/11, no leukocytosis, stable anemia  Resolved Hospital Problem list   Hypomagnesemia/hypokalemia -10/10  Assessment & Plan:  Acute encephalopathy. Focal status epilepticus -MRI and head CT negative.   -Transferring to ICU for LTM EEG -Loaded with Keppra, IV Ativan given 8 mg in divided doses -If seizures continue, Vimpat planned. -If status noted on EEG, then will need intubation and burst suppression -Maintain euglycemia and euthymia  Aspiration pneumonia -treated with Unasyn, now on Augmentin, switch back to Unasyn -Cleared by swallow evaluation 10/13  Pancreatic cancer status post ERCP/stent, resolved CBD obstruction -Resolved postprocedure pancreatitis -Improving LFTs  -Trend intermittently  Presumed COPD -switch to budesonide and Brovana for now  Hypertension -hold antihypertensives   Best Practice (right click and "Reselect all SmartList Selections" daily)   Diet/type: NPO DVT prophylaxis: LMWH GI prophylaxis: N/A Lines: N/A Foley:  N/A Code Status:  full code Last date of multidisciplinary goals of care discussion [10/13 per [facility], husband request full CODE STATUS]  Labs   CBC: Recent Labs  Lab 03/04/22 0344 03/06/22 0014 03/08/22 0319 03/09/22 0405 03/10/22 0407  WBC 8.2 8.9 17.4* 12.8* 10.9*  HGB 10.9* 10.4* 9.6* 9.8* 9.5*  HCT 32.8* 32.1* 27.3* 28.2* 27.7*  MCV 79.4* 81.1 76.3*  77.5* 78.5*  PLT 274 244 237 252 062    Basic Metabolic Panel: Recent Labs  Lab 03/06/22 0014 03/07/22 0240 03/08/22 0319  03/09/22 0405 03/10/22 0407  NA 136 136 136 135 140  K 4.3 3.6 2.8* 4.2 4.6  CL 107 103 102 104 110  CO2 23 20* 23 19* 21*  GLUCOSE 136* 87 88 83 100*  BUN 43* 32* 30* 30* 30*  CREATININE 2.22* 1.78* 1.63* 1.43* 1.36*  CALCIUM 8.3* 9.0 8.1* 7.8* 7.8*  MG  --   --  1.0* 1.8  --    GFR: Estimated Creatinine Clearance: 29.8 mL/min (A) (by C-G formula based on SCr of 1.36 mg/dL (H)). Recent Labs  Lab 03/06/22 0014 03/08/22 0319 03/08/22 0932 03/08/22 1232 03/09/22 0405 03/10/22 0407  WBC 8.9 17.4*  --   --  12.8* 10.9*  LATICACIDVEN  --   --  0.8 0.6  --   --     Liver Function Tests: Recent Labs  Lab 03/06/22 0014 03/07/22 0240 03/08/22 0319 03/09/22 0405 03/10/22 0407  AST 92* 176* 58* 56* 57*  ALT 140* 212* 120* 104* 95*  ALKPHOS 529* 693* 438* 346* 289*  BILITOT 1.7* 2.3* 1.7* 1.2 1.2  PROT 5.4* 5.8* 4.9* 5.1* 5.1*  ALBUMIN 2.1* 2.3* 1.9* 1.9* 1.9*   Recent Labs  Lab 03/06/22 0014 03/07/22 0240 03/08/22 0319  LIPASE 669* 163* 35   Recent Labs  Lab 03/08/22 0704  AMMONIA 33    ABG    Component Value Date/Time   HCO3 25.3 03/08/2022 0704   O2SAT 92 03/08/2022 0704     Coagulation Profile: Recent Labs  Lab 03/08/22 0704  INR 1.2    Cardiac Enzymes: No results for input(s): "CKTOTAL", "CKMB", "CKMBINDEX", "TROPONINI" in the last 168 hours.  HbA1C: No results found for: "HGBA1C"  CBG: Recent Labs  Lab 03/10/22 1833  GLUCAP 89    Review of Systems:   Unable to obtain since unresponsive  Past Medical History:  She,  has a past medical history of Cancer ([place]), Hyperlipidemia, and Hypertension.   Surgical History:   Past Surgical History:  Procedure Laterality Date   ABDOMINAL HYSTERECTOMY     BILIARY BRUSHING  03/05/2022   Procedure: BILIARY BRUSHING;  Surgeon: Carol Ada, MD;  Location: [facility] ENDOSCOPY;  Service: Gastroenterology;;   BILIARY STENT PLACEMENT  03/05/2022   Procedure: BILIARY STENT PLACEMENT;  Surgeon: Carol Ada, MD;  Location: [facility] ENDOSCOPY;  Service: Gastroenterology;;   COLONOSCOPY     02/21/2002 adenomatous polyp, 03/23/2011   COLONOSCOPY W/ POLYPECTOMY  02/2000   TA polyp removed from ascending colon.  performed through [facility] (? affiliate in [place])   COLONOSCOPY WITH PROPOFOL N/A 11/03/2016   Procedure: COLONOSCOPY WITH PROPOFOL;  Surgeon: Manya Silvas, MD;  Location: [facility] ENDOSCOPY;  Service: Endoscopy;  Laterality: N/A;   ERCP N/A 03/05/2022   Procedure: ENDOSCOPIC RETROGRADE CHOLANGIOPANCREATOGRAPHY (ERCP);  Surgeon: Carol Ada, MD;  Location: [place];  Service: Gastroenterology;  Laterality: N/A;   HEMORROIDECTOMY     TONSILLECTOMY       Social History:   reports that she has quit smoking. She has never used smokeless tobacco. She reports that she does not drink alcohol and does not use drugs.   Family History:  Her family history is negative for Breast cancer.   Allergies No Known Allergies   Home Medications  Prior to Admission medications   Medication Sig Start Date End Date Taking? Authorizing Provider  SYMBICORT 160-4.5 MCG/ACT  inhaler Inhale 2 puffs into the lungs as needed (for wheezing or shortness of breath). 02/05/16  Yes [provider]  aspirin EC 81 MG tablet Take 81 mg by mouth daily.    [provider]  ezetimibe (ZETIA) 10 MG tablet Take 10 mg by mouth daily. 12/12/21   [provider]  fluvastatin XL (LESCOL XL) 80 MG 24 hr tablet Take 80 mg by mouth daily. 02/19/16   [provider]  valsartan-hydrochlorothiazide (DIOVAN-HCT) 320-25 MG tablet Take 1 tablet by mouth daily. 01/28/16   [provider]     Critical care time: 41m     RKara MeadMD. FCCP. [place] Pager : [phone]  If no response to pager , please call [phone] until 7 pm After 7:00 pm call Elink  [phone]  03/10/2022

## 2022-03-10 NOTE — Progress Notes (Signed)
Events as documented  Had AMS this afternoon--she was confused, non focal moving all 4 limbs and talking full sentences but non sensically.   When I am seeing her she has def L gaze preference and have noted Focal R sided SZ in [place]  Neurology at bedside on my arrival-otw back from CT head which R/o CVA--loaded with BZD.  Active Sz noted--non suppressible --AED's given per Neuro, plan for LTM, get full labs including Magnesium --CCM consulted and are here to see --she is protecting airway but with continued Sz, will need ICU level care in case decompensated  CC time >45 min   Transferring to service Dr. Roselee Nova assitance  Verneita Griffes, MD [facility] 7:26 PM

## 2022-03-10 NOTE — Progress Notes (Signed)
1857 Dr. Curly Shores wanted pt to go back up to the floor VS getting CT Chest while in CT (pbw) 1843 Pt now a Code Stroke (pbw) 1830 Rapid response just called, RN to call

## 2022-03-10 NOTE — Significant Event (Signed)
Rapid Response Event Note   Reason for Call :  Left gaze, minimally responsive  Initial Focused Assessment:  Pt lying in bed, responds with grimace to painful stimuli. Pt is noted to have right arm and leg twitching. Left gaze. See NIHSS in flowsheet.   Breathing is regular, unlabored. Skin is warm, dry, pink.   VS: T 63F, BP 184/86, HR 102, RR 16, SpO2 98% on room air  Interventions:  -CBG: 89 -Code stroke activated -Ativan total of '8mg'$  and Keppra 4g IV per neurology  Plan of Care:  Transfer to ICU  Event Summary:  MD Notified: Dr. Verlon Au Call Time: [place] Time: 6815 End Time: 1940  Casimer Bilis, RN

## 2022-03-11 ENCOUNTER — Inpatient Hospital Stay (HOSPITAL_COMMUNITY): Payer: PPO

## 2022-03-11 DIAGNOSIS — G9340 Encephalopathy, unspecified: Secondary | ICD-10-CM

## 2022-03-11 DIAGNOSIS — R569 Unspecified convulsions: Secondary | ICD-10-CM | POA: Diagnosis not present

## 2022-03-11 LAB — COMPREHENSIVE METABOLIC PANEL
ALT: 70 U/L — ABNORMAL HIGH (ref 0–44)
AST: 32 U/L (ref 15–41)
Albumin: 1.8 g/dL — ABNORMAL LOW (ref 3.5–5.0)
Alkaline Phosphatase: 224 U/L — ABNORMAL HIGH (ref 38–126)
Anion gap: 6 (ref 5–15)
BUN: 28 mg/dL — ABNORMAL HIGH (ref 8–23)
CO2: 21 mmol/L — ABNORMAL LOW (ref 22–32)
Calcium: 7.5 mg/dL — ABNORMAL LOW (ref 8.9–10.3)
Chloride: 108 mmol/L (ref 98–111)
Creatinine, Ser: 1.29 mg/dL — ABNORMAL HIGH (ref 0.44–1.00)
GFR, Estimated: 41 mL/min — ABNORMAL LOW (ref >60)
Glucose, Bld: 107 mg/dL — ABNORMAL HIGH (ref 70–99)
Potassium: 3.8 mmol/L (ref 3.5–5.1)
Sodium: 135 mmol/L (ref 135–145)
Total Bilirubin: 0.8 mg/dL (ref 0.3–1.2)
Total Protein: 4.7 g/dL — ABNORMAL LOW (ref 6.5–8.1)

## 2022-03-11 LAB — GLUCOSE, CAPILLARY
Glucose-Capillary: 103 mg/dL — ABNORMAL HIGH (ref 70–99)
Glucose-Capillary: 105 mg/dL — ABNORMAL HIGH (ref 70–99)
Glucose-Capillary: 108 mg/dL — ABNORMAL HIGH (ref 70–99)
Glucose-Capillary: 109 mg/dL — ABNORMAL HIGH (ref 70–99)
Glucose-Capillary: 113 mg/dL — ABNORMAL HIGH (ref 70–99)
Glucose-Capillary: 93 mg/dL (ref 70–99)
Glucose-Capillary: 98 mg/dL (ref 70–99)

## 2022-03-11 LAB — CBC
HCT: 26.3 % — ABNORMAL LOW (ref 36.0–46.0)
Hemoglobin: 8.7 g/dL — ABNORMAL LOW (ref 12.0–15.0)
MCH: 26.6 pg (ref 26.0–34.0)
MCHC: 33.1 g/dL (ref 30.0–36.0)
MCV: 80.4 fL (ref 80.0–100.0)
Platelets: 246 10*3/uL (ref 150–400)
RBC: 3.27 MIL/uL — ABNORMAL LOW (ref 3.87–5.11)
RDW: 15.5 % (ref 11.5–15.5)
WBC: 7.8 10*3/uL (ref 4.0–10.5)
nRBC: 0 % (ref 0.0–0.2)

## 2022-03-11 LAB — MAGNESIUM: Magnesium: 1.9 mg/dL (ref 1.7–2.4)

## 2022-03-11 MED ORDER — IOHEXOL 350 MG/ML SOLN
50.0000 mL | Freq: Once | INTRAVENOUS | Status: AC | PRN
  Administered 2022-03-11: 50 mL via INTRAVENOUS

## 2022-03-11 NOTE — Progress Notes (Signed)
Neurology Progress Note  Subjective: -Very sleepy but appears comfortable   Current Facility-Administered Medications:    acetaminophen (TYLENOL) tablet 650 mg, 650 mg, Oral, Q6H PRN, Howerter, Justin B, DO, 650 mg at 03/10/22 1304   Ampicillin-Sulbactam (UNASYN) 3 g in sodium chloride 0.9 % 100 mL IVPB, 3 g, Intravenous, Q12H, Rigoberto Noel, MD, Last Rate: 200 mL/hr at 03/11/22 0922, 3 g at 03/11/22 5573   arformoterol (BROVANA) nebulizer solution 15 mcg, 15 mcg, Nebulization, BID, Payne, John D, PA-C   budesonide (PULMICORT) nebulizer solution 0.25 mg, 0.25 mg, Nebulization, BID, Payne, John D, PA-C   Chlorhexidine Gluconate Cloth 2 % PADS 6 each, 6 each, Topical, Daily, Rigoberto Noel, MD, 6 each at 03/11/22 0923   dextrose 5 % solution, , Intravenous, Continuous, Samtani, Jai-Gurmukh, MD, Last Rate: 50 mL/hr at 03/11/22 0600, Infusion Verify at 03/11/22 0600   enoxaparin (LOVENOX) injection 30 mg, 30 mg, Subcutaneous, Q24H, Danford, Suann Larry, MD, 30 mg at 03/11/22 2202   hydrALAZINE (APRESOLINE) injection 10-40 mg, 10-40 mg, Intravenous, Q4H PRN, Rollene Rotunda, John D, PA-C   levETIRAcetam (KEPPRA) IVPB 500 mg/100 mL premix, 500 mg, Intravenous, Q12H, Makenze Ellett L, MD, Last Rate: 400 mL/hr at 03/11/22 0819, 500 mg at 03/11/22 0819   LORazepam (ATIVAN) injection 2 mg, 2 mg, Intravenous, Q4H PRN, Donnetta Simpers, MD   ondansetron (ZOFRAN) tablet 4 mg, 4 mg, Oral, Q6H PRN **OR** ondansetron (ZOFRAN) injection 4 mg, 4 mg, Intravenous, Q6H PRN, Opyd, Ilene Qua, MD   Oral care mouth rinse, 15 mL, Mouth Rinse, 4 times per day, Rigoberto Noel, MD, 15 mL at 03/10/22 2144   Oral care mouth rinse, 15 mL, Mouth Rinse, PRN, Rigoberto Noel, MD     Exam: Vitals:   03/11/22 0600 03/11/22 0800  BP: 134/62   Pulse: (!) 58   Resp: 17   Temp:  98.4 F (36.9 C)  SpO2: 96%    Gen: In bed, comfortable, EEG in place Resp: non-labored breathing, no grossly audible wheezing Cardiac: Perfusing  extremities well  Abd: soft, nt  Neuro: MS: Sleepy, creeps eyes closed but follows command to stick out tongue, squeezes hands and lets goes with bilateral upper extremities.  Possible slight motor neglect of the right leg CN: Left pupil remains slightly larger than the left but both are reactive, midline gaze at rest, orients bilaterally with a slight right gaze preference.  Corneals intact to light eyelash brush bilaterally.  Face symmetric Sensory/motor: Localizes with bilateral upper extremities and moves them to command.  Brisker with the left lower extremity, right lower extremity slightly weaker on the left the when move her legs antigravity, but is at least 3/5  Pertinent Labs:  Basic Metabolic Panel: Recent Labs  Lab 03/07/22 0240 03/08/22 0319 03/09/22 0405 03/10/22 0407 03/10/22 2257 03/11/22 0647  NA 136 136 135 140  --  135  K 3.6 2.8* 4.2 4.6  --  3.8  CL 103 102 104 110  --  108  CO2 20* 23 19* 21*  --  21*  GLUCOSE 87 88 83 100*  --  107*  BUN 32* 30* 30* 30*  --  28*  CREATININE 1.78* 1.63* 1.43* 1.36*  --  1.29*  CALCIUM 9.0 8.1* 7.8* 7.8*  --  7.5*  MG  --  1.0* 1.8 1.5* 1.6* 1.9    CBC: Recent Labs  Lab 03/06/22 0014 03/08/22 0319 03/09/22 0405 03/10/22 0407 03/11/22 0647  WBC 8.9 17.4* 12.8* 10.9* 7.8  HGB 10.4* 9.6* 9.8* 9.5* 8.7*  HCT 32.1* 27.3* 28.2* 27.7* 26.3*  MCV 81.1 76.3* 77.5* 78.5* 80.4  PLT 244 237 252 260 246    Coagulation Studies: No results for input(s): "LABPROT", "INR" in the last 72 hours.    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  Neurology should be notified for any of these clinical signs for adjustment of antiseizure medications -Neurology will continue to follow  [place] [phone]   CRITICAL CARE Performed by: Lorenza Chick   Total critical care time: 30 minutes  Critical care time was exclusive of separately billable procedures and treating other patients.  Critical care was necessary to treat or prevent imminent or life-threatening deterioration.  Critical care was time spent personally by me on the following activities: development of treatment plan with patient and/or surrogate as well as nursing, discussions with consultants, evaluation of patient's response to treatment, examination of patient, obtaining history from patient or surrogate, ordering and performing treatments and interventions, ordering and review of laboratory studies, ordering and review of radiographic studies, pulse oximetry and re-evaluation of patient's condition.

## 2022-03-11 NOTE — Progress Notes (Signed)
eLink Physician-Brief Progress Note Patient Name: Shirley Nichols DOB: [birth year] MRN: [id number]   Date of Service  03/11/2022  HPI/Events of Note  Patient with urinary retention.  eICU Interventions  In / Out catheterization PRN x 3 ordered.        Kerry Kass Aydien Majette 03/11/2022, 4:58 AM

## 2022-03-11 NOTE — Progress Notes (Signed)
Seen in follow up this afternoon  Awake, feeding herself grits.  Continuing to improve from mental status standpoint throughout the day and stable to leave the ICU.   Will put in transfer orders and ask TRH to take over care 10/15   Eliseo Gum MSN, AGACNP-BC [place] 03/11/2022, 3:27 PM

## 2022-03-11 NOTE — Progress Notes (Signed)
LTM EEG discontinued - no skin breakdown at unhook.   

## 2022-03-11 NOTE — Procedures (Addendum)
Patient Name: Shirley Nichols  MRN: [id number]  Epilepsy Attending: Lora Havens  Referring Physician/Provider: Katy Apo, NP  Duration: 03/10/2022 1920 to 03/11/2022 1030  Patient history: 81yo F with right leg tremoring after the MRI and they also noted that she was talking gibberish all afternoon for 4 to 5 hours. At approximately 1830, she was noted to have right arm and leg jerking with left gaze deviation. EEG to evaluate for seizure  Level of alertness: Awake, asleep  AEDs during EEG study: LEV  Technical aspects: This EEG study was done with scalp electrodes positioned according to the 10-20 International system of electrode placement. Electrical activity was reviewed with band pass filter of 1-'70Hz'$ , sensitivity of 7 uV/mm, display speed of 82m/sec with a '60Hz'$  notched filter applied as appropriate. EEG data were recorded continuously and digitally stored.  Video monitoring was available and reviewed as appropriate.  Description: No clear posterior dominant rhythm was seen. Sleep was characterized by vertex waves, sleep spindles (12 to 14 Hz), maximal frontocentral region. EEG showed continuous generalized and lateralized left hemisphere 3 to 6 Hz theta-delta slowing with overriding 12-'15hz'$  generalized beta activity. Hyperventilation and photic stimulation were not performed.     ABNORMALITY - Continuous slow, generalized and lateralized left hemisphere  IMPRESSION: This study is suggestive of cortical dysfunction arising from left hemisphere likely secondary to underlying structural abnormality, post-ictal state. Additionally there is moderate to severe diffuse encephalopathy, nonspecific etiology. No seizures or epileptiform discharges were seen throughout the recording.  Pauline Pegues OBarbra Sarks

## 2022-03-11 NOTE — Progress Notes (Signed)
NAME:  Shirley Nichols, MRN:  [id number], DOB:  07/29/1940, LOS: 8 ADMISSION DATE:  03/03/2022, CONSULTATION DATE:  03/11/2022  REFERRING MD:  Meredith Mody, CHIEF COMPLAINT:  seizure   History of Present Illness:  81 year old-woman admitted to [facility] 10/6 for elevated liver enzymes and AKI with ultrasound showing CBD obstruction and pancreatic mass confirmed on MRCP.  She was transferred to [facility] and underwent ERCP stent placement on 10/8 with biopsy confirming adenocarcinoma.  She had postprocedure pancreatitis and developed increasing confusion on 10/10 with delirium on 10/13, MRI was negative.  She had witnessed focal right-sided seizure with left gaze preference, head CT was negative for CVA.  She was given 8 mg of Ativan in divided doses, loaded with Keppra and transferred to the ICU for LTM EEG  Pertinent  Medical History  Hypertension Hyperlipidemia Tobacco use for 20 years  Significant Hospital Events: Including procedures, antibiotic start and stop dates in addition to other pertinent events   10/6-transferred to [facility] 10/7 and 10/8-GI Dr. Benson Norway consulted ERCP stent placement 10/9-post ERCP pancreatitis with AKI, pain and discomfort placed on pain meds 10/10 pancreatitis resolved but increasing confusion 10/12 Seen by Optho--unlikely ocular issue realted to visual changes 10/13 Delirious--MRI brain no lesion of casue for visual issues. PCCM consulted, transferred to ICU for cEEG and further monitoring 10/14  more awake and responsive   Interim History / Subjective:   Transferred to ICU overnight for EEG after suspected seizure   Objective   Blood pressure 134/62, pulse (!) 58, temperature 98.4 F (36.9 C), temperature source Oral, resp. rate 17, SpO2 96 %.        Intake/Output Summary (Last 24 hours) at 03/11/2022 0918 Last data filed at 03/11/2022 0600 Gross per 24 hour  Intake 904.07 ml  Output 650 ml  Net 254.07 ml   There were no vitals filed for this  visit.  Examination: General: Elderly F NAD  HENT: NCAT pink tacky mm  Lungs: CTAb even unlabored  Cardiovascular: s1s2 no rgm  Abdomen: soft thin ndnt  Extremities: no acute joint deformity  Neuro: Lethargic Awakens to voice. Following commands. PERRL.    Resolved Hospital Problem list   Hypomagnesemia/hypokalemia -10/10  Assessment & Plan:   Acute encephalopathy ?metabolic Focal Status epilepticus  P -duration of ceeg per neuro, follow read  -AED per neuro  Presumed COPD Aspiration PNA  -Unasyn  -budesonide, brovana   Pancreatic cancer  - status post ERCP/stent, resolved CBD obstruction -Resolved postprocedure pancreatitis P -PRN LFTs   HTN  -hold antihypertensives  Hypomagnesemia P -replaced -trend   Hypoglycemia P -hopefully advance diet -on dextrose     Best Practice (right click and "Reselect all SmartList Selections" daily)   Diet/type: NPO DVT prophylaxis: LMWH GI prophylaxis: N/A Lines: N/A Foley:  N/A Code Status:  full code Last date of multidisciplinary goals of care discussion [10/13 per TRH, husband request full CODE STATUS]  Labs   CBC: Recent Labs  Lab 03/06/22 0014 03/08/22 0319 03/09/22 0405 03/10/22 0407 03/11/22 0647  WBC 8.9 17.4* 12.8* 10.9* 7.8  HGB 10.4* 9.6* 9.8* 9.5* 8.7*  HCT 32.1* 27.3* 28.2* 27.7* 26.3*  MCV 81.1 76.3* 77.5* 78.5* 80.4  PLT 244 237 252 260 841    Basic Metabolic Panel: Recent Labs  Lab 03/07/22 0240 03/08/22 0319 03/09/22 0405 03/10/22 0407 03/10/22 2257 03/11/22 0647  NA 136 136 135 140  --  135  K 3.6 2.8* 4.2 4.6  --  3.8  CL 103  102 104 110  --  108  CO2 20* 23 19* 21*  --  21*  GLUCOSE 87 88 83 100*  --  107*  BUN 32* 30* 30* 30*  --  28*  CREATININE 1.78* 1.63* 1.43* 1.36*  --  1.29*  CALCIUM 9.0 8.1* 7.8* 7.8*  --  7.5*  MG  --  1.0* 1.8 1.5* 1.6* 1.9   GFR: Estimated Creatinine Clearance: 31.4 mL/min (A) (by C-G formula based on SCr of 1.29 mg/dL (H)). Recent Labs  Lab  03/08/22 0319 03/08/22 0932 03/08/22 1232 03/09/22 0405 03/10/22 0407 03/11/22 0647  WBC 17.4*  --   --  12.8* 10.9* 7.8  LATICACIDVEN  --  0.8 0.6  --   --   --     Liver Function Tests: Recent Labs  Lab 03/07/22 0240 03/08/22 0319 03/09/22 0405 03/10/22 0407 03/11/22 0647  AST 176* 58* 56* 57* 32  ALT 212* 120* 104* 95* 70*  ALKPHOS 693* 438* 346* 289* 224*  BILITOT 2.3* 1.7* 1.2 1.2 0.8  PROT 5.8* 4.9* 5.1* 5.1* 4.7*  ALBUMIN 2.3* 1.9* 1.9* 1.9* 1.8*   Recent Labs  Lab 03/06/22 0014 03/07/22 0240 03/08/22 0319  LIPASE 669* 163* 35   Recent Labs  Lab 03/08/22 0704  AMMONIA 33    ABG    Component Value Date/Time   HCO3 25.3 03/08/2022 0704   O2SAT 92 03/08/2022 0704     Coagulation Profile: Recent Labs  Lab 03/08/22 0704  INR 1.2    Cardiac Enzymes: No results for input(s): "CKTOTAL", "CKMB", "CKMBINDEX", "TROPONINI" in the last 168 hours.  HbA1C: No results found for: "HGBA1C"  CBG: Recent Labs  Lab 03/10/22 1833 03/10/22 2131 03/11/22 0017 03/11/22 0347 03/11/22 0750  GLUCAP 89 98 113* 109* 93   CRITICAL CARE Performed by: Cristal Generous   Total critical care time: 36 minutes  Critical care time was exclusive of separately billable procedures and treating other patients. Critical care was necessary to treat or prevent imminent or life-threatening deterioration.  Critical care was time spent personally by me on the following activities: development of treatment plan with patient and/or surrogate as well as nursing, discussions with consultants, evaluation of patient's response to treatment, examination of patient, obtaining history from patient or surrogate, ordering and performing treatments and interventions, ordering and review of laboratory studies, ordering and review of radiographic studies, pulse oximetry and re-evaluation of patient's condition.  Eliseo Gum MSN, AGACNP-BC [place] for  pager  03/11/2022, 9:18 AM

## 2022-03-11 NOTE — Progress Notes (Signed)
[place] Progress Note Patient Name: Shirley Nichols DOB: Jul 06, 1940 MRN: [id number]   Date of Service  03/11/2022  HPI/Events of Note  Pt with urinary retention, has undergone straight catheterization multiple times over the past 24 hours.   eICU Interventions  Foley catheter order placed.         [place] 03/11/2022, 11:45 PM

## 2022-03-12 ENCOUNTER — Inpatient Hospital Stay (HOSPITAL_COMMUNITY): Payer: PPO

## 2022-03-12 ENCOUNTER — Inpatient Hospital Stay: Payer: Self-pay

## 2022-03-12 DIAGNOSIS — G9340 Encephalopathy, unspecified: Secondary | ICD-10-CM

## 2022-03-12 DIAGNOSIS — N179 Acute kidney failure, unspecified: Secondary | ICD-10-CM | POA: Diagnosis not present

## 2022-03-12 DIAGNOSIS — G40001 Localization-related (focal) (partial) idiopathic epilepsy and epileptic syndromes with seizures of localized onset, not intractable, with status epilepticus: Secondary | ICD-10-CM | POA: Diagnosis not present

## 2022-03-12 DIAGNOSIS — C250 Malignant neoplasm of head of pancreas: Secondary | ICD-10-CM | POA: Diagnosis not present

## 2022-03-12 DIAGNOSIS — R569 Unspecified convulsions: Secondary | ICD-10-CM | POA: Diagnosis not present

## 2022-03-12 LAB — COMPREHENSIVE METABOLIC PANEL
ALT: 53 U/L — ABNORMAL HIGH (ref 0–44)
AST: 28 U/L (ref 15–41)
Albumin: 1.9 g/dL — ABNORMAL LOW (ref 3.5–5.0)
Alkaline Phosphatase: 199 U/L — ABNORMAL HIGH (ref 38–126)
Anion gap: 9 (ref 5–15)
BUN: 19 mg/dL (ref 8–23)
CO2: 22 mmol/L (ref 22–32)
Calcium: 8.2 mg/dL — ABNORMAL LOW (ref 8.9–10.3)
Chloride: 104 mmol/L (ref 98–111)
Creatinine, Ser: 1.27 mg/dL — ABNORMAL HIGH (ref 0.44–1.00)
GFR, Estimated: 42 mL/min — ABNORMAL LOW (ref >60)
Glucose, Bld: 111 mg/dL — ABNORMAL HIGH (ref 70–99)
Potassium: 3.5 mmol/L (ref 3.5–5.1)
Sodium: 135 mmol/L (ref 135–145)
Total Bilirubin: 0.7 mg/dL (ref 0.3–1.2)
Total Protein: 5.4 g/dL — ABNORMAL LOW (ref 6.5–8.1)

## 2022-03-12 LAB — GLUCOSE, CAPILLARY
Glucose-Capillary: 106 mg/dL — ABNORMAL HIGH (ref 70–99)
Glucose-Capillary: 94 mg/dL (ref 70–99)
Glucose-Capillary: 98 mg/dL (ref 70–99)
Glucose-Capillary: 104 mg/dL — ABNORMAL HIGH (ref 70–99)
Glucose-Capillary: 80 mg/dL (ref 70–99)
Glucose-Capillary: 97 mg/dL (ref 70–99)

## 2022-03-12 LAB — CBC WITH DIFFERENTIAL/PLATELET
Abs Immature Granulocytes: 0.12 10*3/uL — ABNORMAL HIGH (ref 0.00–0.07)
Basophils Absolute: 0.1 10*3/uL (ref 0.0–0.1)
Basophils Relative: 1 %
Eosinophils Absolute: 0.2 10*3/uL (ref 0.0–0.5)
Eosinophils Relative: 2 %
HCT: 30.3 % — ABNORMAL LOW (ref 36.0–46.0)
Hemoglobin: 9.9 g/dL — ABNORMAL LOW (ref 12.0–15.0)
Immature Granulocytes: 1 %
Lymphocytes Relative: 11 %
Lymphs Abs: 1.2 10*3/uL (ref 0.7–4.0)
MCH: 26.2 pg (ref 26.0–34.0)
MCHC: 32.7 g/dL (ref 30.0–36.0)
MCV: 80.2 fL (ref 80.0–100.0)
Monocytes Absolute: 0.7 10*3/uL (ref 0.1–1.0)
Monocytes Relative: 7 %
Neutro Abs: 8.2 10*3/uL — ABNORMAL HIGH (ref 1.7–7.7)
Neutrophils Relative %: 78 %
Platelets: 327 10*3/uL (ref 150–400)
RBC: 3.78 MIL/uL — ABNORMAL LOW (ref 3.87–5.11)
RDW: 15.5 % (ref 11.5–15.5)
WBC: 10.5 10*3/uL (ref 4.0–10.5)
nRBC: 0 % (ref 0.0–0.2)

## 2022-03-12 LAB — PROTIME-INR
INR: 1.1 (ref 0.8–1.2)
Prothrombin Time: 14.4 seconds (ref 11.4–15.2)

## 2022-03-12 LAB — MAGNESIUM
Magnesium: 1.5 mg/dL — ABNORMAL LOW (ref 1.7–2.4)
Magnesium: 1.8 mg/dL (ref 1.7–2.4)

## 2022-03-12 LAB — APTT: aPTT: 29 seconds (ref 24–36)

## 2022-03-12 MED ORDER — LORAZEPAM 2 MG/ML IJ SOLN
INTRAMUSCULAR | Status: AC
  Filled 2022-03-12: qty 1

## 2022-03-12 MED ORDER — LORAZEPAM 2 MG/ML IJ SOLN
4.0000 mg | INTRAMUSCULAR | Status: AC
  Administered 2022-03-12: 4 mg via INTRAVENOUS

## 2022-03-12 MED ORDER — SODIUM CHLORIDE 0.9 % IV SOLN
200.0000 mg | Freq: Two times a day (BID) | INTRAVENOUS | Status: DC
  Administered 2022-03-13 – 2022-03-14 (×3): 200 mg via INTRAVENOUS
  Filled 2022-03-12 (×5): qty 20

## 2022-03-12 MED ORDER — ENOXAPARIN SODIUM 40 MG/0.4ML IJ SOSY: 40.0000 mg | PREFILLED_SYRINGE | INTRAMUSCULAR | Status: DC

## 2022-03-12 MED ORDER — MAGNESIUM SULFATE 2 GM/50ML IV SOLN
2.0000 g | INTRAVENOUS | Status: AC
  Administered 2022-03-12: 2 g via INTRAVENOUS
  Filled 2022-03-12: qty 50

## 2022-03-12 MED ORDER — SODIUM CHLORIDE 0.9 % IV SOLN
750.0000 mg | Freq: Two times a day (BID) | INTRAVENOUS | Status: DC
  Administered 2022-03-12 – 2022-03-18 (×12): 750 mg via INTRAVENOUS
  Filled 2022-03-12 (×14): qty 7.5

## 2022-03-12 MED ORDER — SODIUM CHLORIDE 0.9 % IV SOLN
200.0000 mg | Freq: Two times a day (BID) | INTRAVENOUS | Status: DC
  Filled 2022-03-12: qty 20

## 2022-03-12 MED ORDER — SODIUM CHLORIDE 0.9 % IV SOLN
100.0000 mg | Freq: Two times a day (BID) | INTRAVENOUS | Status: DC
  Filled 2022-03-12: qty 10

## 2022-03-12 MED ORDER — SODIUM CHLORIDE 0.9% FLUSH: 10.0000 mL | INTRAVENOUS | Status: DC | PRN

## 2022-03-12 MED ORDER — SODIUM CHLORIDE 0.9 % IV SOLN
250.0000 mg | Freq: Once | INTRAVENOUS | Status: AC
  Administered 2022-03-12: 250 mg via INTRAVENOUS
  Filled 2022-03-12: qty 2.5

## 2022-03-12 MED ORDER — SODIUM CHLORIDE 0.9 % IV SOLN
3.0000 g | Freq: Three times a day (TID) | INTRAVENOUS | Status: DC
  Administered 2022-03-12 – 2022-03-13 (×2): 3 g via INTRAVENOUS
  Filled 2022-03-12 (×3): qty 8

## 2022-03-12 MED ORDER — SODIUM CHLORIDE 0.9 % IV SOLN
200.0000 mg | Freq: Once | INTRAVENOUS | Status: AC
  Administered 2022-03-12: 200 mg via INTRAVENOUS
  Filled 2022-03-12: qty 20

## 2022-03-12 MED ORDER — SODIUM CHLORIDE 0.9% FLUSH
10.0000 mL | Freq: Two times a day (BID) | INTRAVENOUS | Status: DC
  Administered 2022-03-12: 20 mL
  Administered 2022-03-13 – 2022-03-21 (×15): 10 mL
  Administered 2022-03-21 – 2022-03-22 (×2): 20 mL
  Administered 2022-03-22 – 2022-03-23 (×2): 10 mL

## 2022-03-12 NOTE — Progress Notes (Signed)
PROGRESS NOTE    Shirley Nichols  VXB:[id number] DOB: 1941/02/01 DOA: 03/03/2022 PCP: Remi Haggard, FNP  Outpatient Specialists:     Brief Narrative:  Patient is an 81 year old Caucasian female with past medical history significant for hypertension, hyperlipidemia and tobacco use for over 20 years.  Patient was admitted to [facility] 10/6 for elevated liver enzymes and AKI with ultrasound showing CBD obstruction and pancreatic mass confirmed on MRCP.  She was transferred to [facility] and underwent ERCP stent placement on 10/8 with biopsy confirming adenocarcinoma.  She had postprocedure pancreatitis and developed increasing confusion on 10/10 with delirium on 10/13, MRI was negative.  She had witnessed focal right-sided seizure with left gaze preference, head CT was negative for CVA.  She was given 8 mg of Ativan in divided doses, loaded with Keppra and transferred to the ICU for LTM EEG.  Summary of events as per ICU team: 10/6-transferred to [facility] 10/7 and 10/8-GI Dr. Benson Norway consulted ERCP stent placement 10/9-post ERCP pancreatitis with AKI, pain and discomfort placed on pain meds 10/10 pancreatitis resolved but increasing confusion 10/12 Seen by Optho--unlikely ocular issue realted to visual changes 10/13 Delirious--MRI brain no lesion of casue for visual issues. PCCM consulted, transferred to ICU for cEEG and further monitoring 10/14  more awake and responsive   [facility] team assumed care today.  Patient remains very sleepy.  Patient was seen alongside patient's daughter.  Patient has been able to participate in the history.  As documented above, patient continues to have seizures.  Neurology team is directing care.  Apparently, patient presented with painless jaundice and work-up was revealed pancreatic adeno CA.  MRI brain is nonrevealing.  CT chest has not shown any metastasis.  MRCP has not shown any mass.  LP is planned for tomorrow with further analysis.  Goal of care  will depend on above.  Please consult oncology team after LP analysis tomorrow.  Daughter is also worried about visual problems.  Please consult ophthalmology and patient is more awake.  Patient's daughter has been updated extensively.   Assessment & Plan:   Principal Problem:   Pancreatic cancer ([place]) Active Problems:   AKI (acute kidney injury) ([place])   Post-ERCP acute pancreatitis   Hypertension   Hyperlipidemia   Hypokalemia   Encephalopathy acute   Seizure ([place])  Acute encephalopathy ?metabolic Focal Status epilepticus  P -Neurology team is directing care. -Patient is currently on Vimpat and Keppra.  Patient is also on Ativan as needed.   Presumed COPD Aspiration PNA  -Unasyn    Pancreatic cancer  - status post ERCP/stent, resolved CBD obstruction -Resolved postprocedure pancreatitis P -Consult oncology team in a.m.   HTN  -Continue to optimize.    DVT prophylaxis: On hold as LP is planned. Code Status: Full code for now Family Communication: Daughter Disposition Plan: This will depend on hospital course   Consultants:  Neurology Consider consulting oncology and ophthalmology team tomorrow.  Procedures:  Lumbar puncture is planned for tomorrow.  Antimicrobials:  IV Augmentin 3 g every 8 hours only.   Subjective: Patient remains sleepy. Patient continues to have seizures.  Objective: Vitals:   03/12/22 0500 03/12/22 0733 03/12/22 1102 03/12/22 1548  BP: 129/64 (!) 141/72 (!) 140/56 (!) 142/76  Pulse: 66 87 79 86  Resp: '15 19 19 20  '$ Temp:  97.9 F (36.6 C) 98.1 F (36.7 C) 98.1 F (36.7 C)  TempSrc:  Oral Axillary Axillary  SpO2: 95% 96% 95% 95%  Intake/Output Summary (Last 24 hours) at 03/12/2022 1732 Last data filed at 03/12/2022 1603 Gross per 24 hour  Intake 1030.65 ml  Output 1600 ml  Net -569.35 ml   There were no vitals filed for this visit.  Examination:  General exam: Patient is sleepy.  No significant history from  patient. Respiratory system: Clear to auscultation.  Cardiovascular system: S1 & S2 heard Gastrointestinal system: Abdomen is nondistended, soft and nontender. No organomegaly or masses felt. Normal bowel sounds heard. Central nervous system: Sleepy.   Extremities: Fullness of the ankle.  Data Reviewed: I have personally reviewed following labs and imaging studies  CBC: Recent Labs  Lab 03/06/22 0014 03/08/22 0319 03/09/22 0405 03/10/22 0407 03/11/22 0647  WBC 8.9 17.4* 12.8* 10.9* 7.8  HGB 10.4* 9.6* 9.8* 9.5* 8.7*  HCT 32.1* 27.3* 28.2* 27.7* 26.3*  MCV 81.1 76.3* 77.5* 78.5* 80.4  PLT 244 237 252 260 332   Basic Metabolic Panel: Recent Labs  Lab 03/07/22 0240 03/08/22 0319 03/08/22 0319 03/09/22 0405 03/10/22 0407 03/10/22 2257 03/11/22 0647 03/12/22 0034  NA 136 136  --  135 140  --  135  --   K 3.6 2.8*  --  4.2 4.6  --  3.8  --   CL 103 102  --  104 110  --  108  --   CO2 20* 23  --  19* 21*  --  21*  --   GLUCOSE 87 88  --  83 100*  --  107*  --   BUN 32* 30*  --  30* 30*  --  28*  --   CREATININE 1.78* 1.63*  --  1.43* 1.36*  --  1.29*  --   CALCIUM 9.0 8.1*  --  7.8* 7.8*  --  7.5*  --   MG  --  1.0*   < > 1.8 1.5* 1.6* 1.9 1.5*   < > = values in this interval not displayed.   GFR: Estimated Creatinine Clearance: 31.4 mL/min (A) (by C-G formula based on SCr of 1.29 mg/dL (H)). Liver Function Tests: Recent Labs  Lab 03/07/22 0240 03/08/22 0319 03/09/22 0405 03/10/22 0407 03/11/22 0647  AST 176* 58* 56* 57* 32  ALT 212* 120* 104* 95* 70*  ALKPHOS 693* 438* 346* 289* 224*  BILITOT 2.3* 1.7* 1.2 1.2 0.8  PROT 5.8* 4.9* 5.1* 5.1* 4.7*  ALBUMIN 2.3* 1.9* 1.9* 1.9* 1.8*   Recent Labs  Lab 03/06/22 0014 03/07/22 0240 03/08/22 0319  LIPASE 669* 163* 35   Recent Labs  Lab 03/08/22 0704  AMMONIA 33   Coagulation Profile: Recent Labs  Lab 03/08/22 0704  INR 1.2   Cardiac Enzymes: No results for input(s): "CKTOTAL", "CKMB", "CKMBINDEX",  "TROPONINI" in the last 168 hours. BNP (last 3 results) No results for input(s): "PROBNP" in the last 8760 hours. HbA1C: No results for input(s): "HGBA1C" in the last 72 hours. CBG: Recent Labs  Lab 03/11/22 2325 03/12/22 0356 03/12/22 0739 03/12/22 1058 03/12/22 1506  GLUCAP 105* 106* 94 98 104*   Lipid Profile: No results for input(s): "CHOL", "HDL", "LDLCALC", "TRIG", "CHOLHDL", "LDLDIRECT" in the last 72 hours. Thyroid Function Tests: No results for input(s): "TSH", "T4TOTAL", "FREET4", "T3FREE", "THYROIDAB" in the last 72 hours. Anemia Panel: No results for input(s): "VITAMINB12", "FOLATE", "FERRITIN", "TIBC", "IRON", "RETICCTPCT" in the last 72 hours. Urine analysis:    Component Value Date/Time   COLORURINE YELLOW 03/08/2022 0650   APPEARANCEUR HAZY (A) 03/08/2022 0650   LABSPEC 1.011 03/08/2022  [place] 5.0 03/08/2022 0650   GLUCOSEU NEGATIVE 03/08/2022 0650   HGBUR NEGATIVE 03/08/2022 0650   BILIRUBINUR NEGATIVE 03/08/2022 [place] 03/08/2022 0650   PROTEINUR NEGATIVE 03/08/2022 0650   NITRITE NEGATIVE 03/08/2022 0650   LEUKOCYTESUR NEGATIVE 03/08/2022 0650   Sepsis Labs: '@LABRCNTIP'$ (procalcitonin:4,lacticidven:4)  ) Recent Results (from the past 240 hour(s))  MRSA Next Gen by PCR, Nasal     Status: None   Collection Time: 03/10/22  8:47 PM   Specimen: Nasal Mucosa; Nasal Swab  Result Value Ref Range Status   MRSA by PCR Next Gen NOT DETECTED NOT DETECTED Final    Comment: (NOTE) The GeneXpert MRSA Assay (FDA approved for NASAL specimens only), is one component of a comprehensive MRSA colonization surveillance program. It is not intended to diagnose MRSA infection nor to guide or monitor treatment for MRSA infections. Test performance is not FDA approved in patients less than 27 years old. Performed at [facility], [place] [address] [place], [place] [address]          Radiology Studies: [place] EKG SITE RITE  Result Date:  03/12/2022 If Site Rite image not attached, placement could not be confirmed due to current cardiac rhythm.  CT CHEST W CONTRAST  Result Date: 03/11/2022 CLINICAL DATA:  Pancreatic cancer. EXAM: CT CHEST WITH CONTRAST TECHNIQUE: Multidetector CT imaging of the chest was performed during intravenous contrast administration. RADIATION DOSE REDUCTION: This exam was performed according to the departmental dose-optimization program which includes automated exposure control, adjustment of the mA and/or kV according to patient size and/or use of iterative reconstruction technique. CONTRAST:  66m OMNIPAQUE IOHEXOL 350 MG/ML SOLN COMPARISON:  None Available. FINDINGS: Cardiovascular: The heart size is normal. No substantial pericardial effusion. Mild atherosclerotic calcification is noted in the wall of the thoracic aorta. Mediastinum/Nodes: 2.1 cm right thyroid nodule evident. Small mediastinal and right hilar lymph nodes evident. The esophagus has normal imaging features. There is no axillary lymphadenopathy. Lungs/Pleura: Collapse/consolidative opacity is seen in both lower lobes with small to moderate bilateral pleural effusions. Mild interlobular septal thickening noted in the upper lobes bilaterally. No overtly suspicious pulmonary nodule or mass. Upper Abdomen: 1.8 cm hypervascular subcapsular lesion seen in the dome of the liver (image 107/3). This was not visible on MRI of 03/03/2022. Pneumobilia evident. Left adrenal nodule is similar to previous MRI. Biliary stent device is been incompletely visualized. Musculoskeletal: No worrisome lytic or sclerotic osseous abnormality. IMPRESSION: 1. Collapse/consolidative opacity in both lower lobes with small to moderate bilateral pleural effusions. Imaging features are compatible with compressive atelectasis although superimposed pneumonia not excluded. 2. 1.8 cm hypervascular subcapsular lesion in the dome of the liver. This was not visible on MRI of 03/03/2022. This  may be related to perfusion anomaly, but given the history of pancreatic cancer, close follow-up recommended. 3. 2.1 cm right thyroid nodule. In the setting of significant comorbidities or limited life expectancy, no follow-up recommended (ref: J Am Coll Radiol. 2015 Feb;12(2): 143-50). 4. Small mediastinal and right hilar lymph nodes, likely reactive. 5. Pneumobilia. Biliary stent device is been incompletely visualized. 6. Stable left adrenal nodule. This was incompletely characterized on previous MRI but felt favor a benign adrenal adenoma. 7.  Aortic Atherosclerosis (ICD10-I70.0). Electronically Signed   By: EMisty StanleyM.D.   On: 03/11/2022 15:25   Overnight EEG with video  Result Date: 03/11/2022 YLora Havens MD     03/11/2022 12:33 PM Patient Name: CFRANCHESKA VILLEDAMRN: [id number]  Attending: Lora Havens Referring Physician/Provider: Katy Apo, NP Duration: 03/10/2022 1920 to 03/11/2022 1030 Patient history: 81yo F with right leg tremoring after the MRI and they also noted that she was talking gibberish all afternoon for 4 to 5 hours. At approximately 1830, she was noted to have right arm and leg jerking with left gaze deviation. EEG to evaluate for seizure Level of alertness: Awake, asleep AEDs during EEG study: LEV Technical aspects: This EEG study was done with scalp electrodes positioned according to the 10-20 International system of electrode placement. Electrical activity was reviewed with band pass filter of 1-'70Hz'$ , sensitivity of 7 uV/mm, display speed of 21m/sec with a '60Hz'$  notched filter applied as appropriate. EEG data were recorded continuously and digitally stored.  Video monitoring was available and reviewed as appropriate. Description: No clear posterior dominant rhythm was seen. Sleep was characterized by vertex waves, sleep spindles (12 to 14 Hz), maximal frontocentral region. EEG showed continuous generalized and lateralized left hemisphere 3 to 6 Hz  theta-delta slowing with overriding 12-'15hz'$  generalized beta activity. Hyperventilation and photic stimulation were not performed.   ABNORMALITY - Continuous slow, generalized and lateralized left hemisphere IMPRESSION: This study is suggestive of cortical dysfunction arising from left hemisphere likely secondary to underlying structural abnormality, post-ictal state. Additionally there is moderate to severe diffuse encephalopathy, nonspecific etiology. No seizures or epileptiform discharges were seen throughout the recording. PLora Havens  CT HEAD CODE STROKE WO CONTRAST  Result Date: 03/10/2022 CLINICAL DATA:  Code stroke. EXAM: CT HEAD WITHOUT CONTRAST TECHNIQUE: Contiguous axial images were obtained from the base of the skull through the vertex without intravenous contrast. RADIATION DOSE REDUCTION: This exam was performed according to the departmental dose-optimization program which includes automated exposure control, adjustment of the mA and/or kV according to patient size and/or use of iterative reconstruction technique. COMPARISON:  No prior head CT, correlation is made with same day MRI head FINDINGS: Brain: No evidence of acute infarction, hemorrhage, cerebral edema, mass, mass effect, or midline shift. No hydrocephalus or extra-axial collection. Cerebral volume is within normal limits for age. Vascular: No hyperdense vessel. Skull: Negative for fracture or focal lesion. Sinuses/Orbits: Mucous retention cyst in the left maxillary sinus. Mucosal thickening in the ethmoid air cells and left frontal sinus. Status post bilateral lens replacements. Other: The mastoid air cells are well aerated. ASPECTS ([facility] Early [facility] Score) - Ganglionic level infarction (caudate, lentiform nuclei, internal capsule, insula, M1-M3 cortex): 7 - Supraganglionic infarction (M4-M6 cortex): 3 Total score (0-10 with 10 being normal): 10 IMPRESSION: 1. No acute intracranial process. 2. ASPECTS is 10. Code  stroke imaging results were communicated on 03/10/2022 at 6:57 pm to provider BHAGAT via secure text paging. Electronically Signed   By: AMerilyn BabaM.D.   On: 03/10/2022 18:57        Scheduled Meds:  arformoterol  15 mcg Nebulization BID   budesonide (PULMICORT) nebulizer solution  0.25 mg Nebulization BID   Chlorhexidine Gluconate Cloth  6 each Topical Daily   [START ON 03/14/2022] enoxaparin (LOVENOX) injection  40 mg Subcutaneous Q24H   mouth rinse  15 mL Mouth Rinse 4 times per day   Continuous Infusions:  ampicillin-sulbactam (UNASYN) IV 3 g (03/12/22 1603)   dextrose 50 mL/hr at 03/12/22 1540   lacosamide (VIMPAT) IV     levETIRAcetam 500 mg (03/12/22 0744)     LOS: 9 days    Time spent: 55 minutes.    SDana Allan MD  [facility] Pager #: [phone] 7PM-7AM contact night coverage as above

## 2022-03-12 NOTE — Progress Notes (Signed)
   NAME:  Shirley Nichols, MRN:  [id number], DOB:  April 24, 1941, LOS: 9 ADMISSION DATE:  03/03/2022, CONSULTATION DATE:  03/12/2022  REFERRING MD:  Meredith Mody, CHIEF COMPLAINT:  seizure   History of Present Illness:  81 year old-woman admitted to [facility] 10/6 for elevated liver enzymes and AKI with ultrasound showing CBD obstruction and pancreatic mass confirmed on MRCP.  She was transferred to [facility] and underwent ERCP stent placement on 10/8 with biopsy confirming adenocarcinoma.  She had postprocedure pancreatitis and developed increasing confusion on 10/10 with delirium on 10/13, MRI was negative.  She had witnessed focal right-sided seizure with left gaze preference, head CT was negative for CVA.  She was given 8 mg of Ativan in divided doses, loaded with Keppra and transferred to the ICU for LTM EEG  Pertinent  Medical History  Hypertension Hyperlipidemia Tobacco use for 20 years  Significant Hospital Events: Including procedures, antibiotic start and stop dates in addition to other pertinent events   10/6-transferred to [facility] 10/7 and 10/8-GI Dr. Benson Norway consulted ERCP stent placement 10/9-post ERCP pancreatitis with AKI, pain and discomfort placed on pain meds 10/10 pancreatitis resolved but increasing confusion 10/12 Seen by Optho--unlikely ocular issue realted to visual changes 10/13 Delirious--MRI brain no lesion of casue for visual issues. PCCM consulted, transferred to ICU for cEEG and further monitoring 10/14  more awake and responsive  10/15 more focal seizures, reconsult  Interim History / Subjective:  She is sleepy but protecting airway.    Objective   Blood pressure (!) 142/76, pulse 86, temperature 98.1 F (36.7 C), temperature source Axillary, resp. rate 20, SpO2 95 %.        Intake/Output Summary (Last 24 hours) at 03/12/2022 1803 Last data filed at 03/12/2022 1603 Gross per 24 hour  Intake 980.58 ml  Output 1600 ml  Net -619.42 ml    There were no  vitals filed for this visit.  Examination: No distress Moves all 4 ext to command Leftward eye preference but can cross midline Normal resp pattern Ext warm    Resolved Hospital Problem list   Hypomagnesemia/hypokalemia -10/10  Assessment & Plan:   Ongoing focal Status epilepticus and medication induced encephalopathy/ threatened airway- protecting airway at present, no secretion issues -ICU for possible phenobarb load -Start EtCO2 monitoring, if rising and/or mental status worsens would intubate -Likely LP in AM as there is concern for leptomeningeal involvement given migrating nature of seizures  Presumed COPD Aspiration PNA- on day 5 Unasyn  -budesonide, brovana   Pancreatic cancer  - status post ERCP/stent, resolved CBD obstruction - Resolved postprocedure pancreatitis  Best Practice (right click and "Reselect all SmartList Selections" daily)   Diet/type: NPO DVT prophylaxis: LMWH GI prophylaxis: N/A Lines: N/A Foley:  N/A Code Status:  full code Last date of multidisciplinary goals of care discussion [Husband updated at bedside 10/15, continued full scope desired]  34 min cc time Erskine Emery MD PCCM 03/12/2022, 6:03 PM

## 2022-03-12 NOTE — Progress Notes (Signed)
PHARMACY NOTE:  ANTIMICROBIAL RENAL DOSAGE ADJUSTMENT  Current antimicrobial regimen includes a mismatch between antimicrobial dosage and estimated renal function.  As per policy approved by the Pharmacy & Therapeutics and Medical Executive Committees, the antimicrobial dosage will be adjusted accordingly.  Current antimicrobial dosage:  Unasyn 3g q12h   Indication: PNA  Renal Function:  Estimated Creatinine Clearance: 31.4 mL/min (A) (by C-G formula based on SCr of 1.29 mg/dL (H)).  Antimicrobial dosage has been changed to:  Unasyn 3g q8h   Additional comments:   Thank you for allowing pharmacy to be a part of this patient's care.  Cristela Felt, PharmD, BCPS Clinical Pharmacist 03/12/2022 2:50 PM

## 2022-03-12 NOTE — Progress Notes (Signed)
LTM EEG hooked up and running - no initial skin breakdown - push button tested - neuro notified. [facility] monitoring.  

## 2022-03-12 NOTE — Progress Notes (Signed)
Neurology Progress Note  Subjective: Per nursing patient had 2 seizures overnight.  This morning patient had 1 seizure consisting of bilateral upper arm jerking which lasted less than a minute per daughter who was at bedside.  2 seizures observed at bedside consisting of twitching of right side of face and bilateral arm jerking with aphasia, without return to cognitive baseline in between.   Additionally patient's daughter reports that she has had new onset visual impairment since the middle of last week (difficulty recognizing family members by face, though she is able to recognize them by voice) but patient is able to count fingers seen at the end of the bed.  Please note that she was evaluated by ophthalmology recently for this visual concern and their evaluation was reassuring   Current Facility-Administered Medications:    acetaminophen (TYLENOL) tablet 650 mg, 650 mg, Oral, Q6H PRN, Howerter, Justin B, DO, 650 mg at 03/10/22 1304   Ampicillin-Sulbactam (UNASYN) 3 g in sodium chloride 0.9 % 100 mL IVPB, 3 g, Intravenous, Q12H, Rigoberto Noel, MD, Last Rate: 200 mL/hr at 03/12/22 0857, 3 g at 03/12/22 0857   arformoterol (BROVANA) nebulizer solution 15 mcg, 15 mcg, Nebulization, BID, Mick Sell, PA-C, 15 mcg at 03/12/22 0720   budesonide (PULMICORT) nebulizer solution 0.25 mg, 0.25 mg, Nebulization, BID, Rollene Rotunda, John D, PA-C, 0.25 mg at 03/12/22 0720   Chlorhexidine Gluconate Cloth 2 % PADS 6 each, 6 each, Topical, Daily, Rigoberto Noel, MD, 6 each at 03/12/22 0856   dextrose 5 % solution, , Intravenous, Continuous, Samtani, Jai-Gurmukh, MD, Last Rate: 50 mL/hr at 03/12/22 0000, Infusion Verify at 03/12/22 0000   enoxaparin (LOVENOX) injection 30 mg, 30 mg, Subcutaneous, Q24H, Danford, Suann Larry, MD, 30 mg at 03/11/22 2306   hydrALAZINE (APRESOLINE) injection 10-40 mg, 10-40 mg, Intravenous, Q4H PRN, Andres Labrum D, PA-C   lacosamide (VIMPAT) 200 mg in sodium chloride 0.9 % 25 mL IVPB, 200  mg, Intravenous, Once **FOLLOWED BY** lacosamide (VIMPAT) 100 mg in sodium chloride 0.9 % 25 mL IVPB, 100 mg, Intravenous, Q12H, Daxtyn Rottenberg L, MD   levETIRAcetam (KEPPRA) IVPB 500 mg/100 mL premix, 500 mg, Intravenous, Q12H, Kjell Brannen L, MD, Last Rate: 400 mL/hr at 03/12/22 0744, 500 mg at 03/12/22 0744   LORazepam (ATIVAN) injection 2 mg, 2 mg, Intravenous, Q4H PRN, Donnetta Simpers, MD, 2 mg at 03/11/22 2357   ondansetron (ZOFRAN) tablet 4 mg, 4 mg, Oral, Q6H PRN **OR** ondansetron (ZOFRAN) injection 4 mg, 4 mg, Intravenous, Q6H PRN, Opyd, Ilene Qua, MD   Oral care mouth rinse, 15 mL, Mouth Rinse, 4 times per day, Rigoberto Noel, MD, 15 mL at 03/12/22 0740   Oral care mouth rinse, 15 mL, Mouth Rinse, PRN, Rigoberto Noel, MD   Exam: Vitals:   03/12/22 0500 03/12/22 0733  BP: 129/64 (!) 141/72  Pulse: 66 87  Resp: 15 19  Temp:  97.9 F (36.6 C)  SpO2: 95% 96%   Gen: In bed, comfortable Resp: non-labored breathing, no grossly audible wheezing Cardiac: Perfusing extremities well   Neuro: MS: (before lorazepam) patient is slightly drowsy but arouses to voice oriented x2 gets month correct but believes year is 48.  After lorazepam patient is somnolent oriented to name only and able to respond only with repeated stimulation CN: Pupils equal round reactive to light, extraocular movements intact, visual fields intact, facial sensation symmetrical, facial movement symmetrical, shoulder shrug symmetrical, tongue midline, hearing intact to voice, phonation normal Sensory/motor: Sensory intact to  light touch in all 4 extremities.  Able to move upper extremities with good antigravity strength.  Lower extremities weaker but able to lift off bed DTR: 2+ throughout  Pertinent Labs:  Basic Metabolic Panel: Recent Labs  Lab 03/07/22 0240 03/07/22 0240 03/08/22 0319 03/09/22 0405 03/10/22 0407 03/10/22 2257 03/11/22 0647 03/12/22 0034  NA 136  --  136 135 140  --  135  --   K  3.6  --  2.8* 4.2 4.6  --  3.8  --   CL 103  --  102 104 110  --  108  --   CO2 20*  --  23 19* 21*  --  21*  --   GLUCOSE 87  --  88 83 100*  --  107*  --   BUN 32*  --  30* 30* 30*  --  28*  --   CREATININE 1.78*  --  1.63* 1.43* 1.36*  --  1.29*  --   CALCIUM 9.0  --  8.1* 7.8* 7.8*  --  7.5*  --   MG  --    < > 1.0* 1.8 1.5* 1.6* 1.9 1.5*   < > = values in this interval not displayed.     CBC: Recent Labs  Lab 03/06/22 0014 03/08/22 0319 03/09/22 0405 03/10/22 0407 03/11/22 0647  WBC 8.9 17.4* 12.8* 10.9* 7.8  HGB 10.4* 9.6* 9.8* 9.5* 8.7*  HCT 32.1* 27.3* 28.2* 27.7* 26.3*  MCV 81.1 76.3* 77.5* 78.5* 80.4  PLT 244 237 252 260 246     Coagulation Studies: No results for input(s): "LABPROT", "INR" in the last 72 hours.    EEG read 10/13-10/14 This study is suggestive of cortical dysfunction arising from left hemisphere likely secondary to underlying structural abnormality, post-ictal state. Additionally there is moderate to severe diffuse encephalopathy, nonspecific etiology. No seizures or epileptiform discharges were seen throughout the recording.  Impression: 81 year old with past medical history significant for hypertension, hyperlipidemia, recent diagnosis of pancreatic adenocarcinoma, found to be in focal motor status with impaired awareness on 7/13, now with recurrent seizures overnight and this morning.  4 mg of Ativan administered for recurrent focal status epilepticus with cessation of seizures.    Recommendations: -Will follow clinical exam, no need for repeat EEG at this time -Magnesium low again this morning at 1.5, ordered 2 g STAT, please consider standing supplmentation, target Mg > 4 -PT, PTT, INR, CBC, CMP, and continue to follow magnesium -Continue Keppra 500 every 12 hours (if renal function continues to improve may be able to increase to 750 mg, but currently CrCl ~30 (for CrCl 30 - 50, dose max would be 750 mg BID) -S/p 4 mg IV Ativan again this  morning at 10/14 -Vimpat 200 mg loading dose, followed by 100 mg BID (PR interval reviewed and normal) -Please note her semiology appears to be aphasia, left gaze deviation, and tremoring the right arm and leg.  Neurology should be notified for any of these clinical signs for further adjustment of antiseizure medications -Will plan for LP on 10/16 to include opening pressure, flow and cytology given malignancy history in addition to basic studies, discussed with husband via phone who gave consent -Enoxaparin retimed to 10/17 to allow for lumbar puncture -Neurology will continue to follow  [place] [phone]   CRITICAL CARE Performed by: Lorenza Chick  Total critical care time: 30 minutes for recurrent focal motor status  Critical care time was exclusive of separately billable  procedures and treating other patients.  Critical care was necessary to treat or prevent imminent or life-threatening deterioration; focal motor status with risk of secondary generalization, resolved  Critical care was time spent personally by me on the following activities: development of treatment plan with patient and/or surrogate as well as nursing, discussions with consultants, evaluation of patient's response to treatment, examination of patient, obtaining history from patient or surrogate, ordering and performing treatments and interventions, ordering and review of laboratory studies, ordering and review of radiographic studies, pulse oximetry and re-evaluation of patient's condition.

## 2022-03-12 NOTE — Progress Notes (Addendum)
Notified by nursing of further brief focal seizure activity  On my examination patient had witnessed brief spell of right face twitching with left gaze deviation and bilateral leg shaking.  Clinically I feel this is concerning for focal seizure.  Postevent she is able to track examiner bilaterally with a slight right gaze preference.  She responds "yes" but not clearly following commands.  She remains very sleepy.  Resume LTM given patient's depressed mental status Increase standing Vimpat to 200 twice daily, with additional 200 mg loading dose now to complete full 400 mg loading dose Increase standing Keppra to 750 twice daily, with additional 250 mg catch up dose Stat CBC, CMP, PT/INR, PTT, magnesium ordered Critical care medicine consulted Neurology will continue to follow  [place] [phone] And additional 35 minutes of critical care was provided

## 2022-03-12 NOTE — Progress Notes (Signed)
Peripherally Inserted Central Catheter Placement  The IV Nurse has discussed with the patient and/or persons authorized to consent for the patient, the purpose of this procedure and the potential benefits and risks involved with this procedure.  The benefits include less needle sticks, lab draws from the catheter, and the patient may be discharged home with the catheter. Risks include, but not limited to, infection, bleeding, blood clot (thrombus formation), and puncture of an artery; nerve damage and irregular heartbeat and possibility to perform a PICC exchange if needed/ordered by physician.  Alternatives to this procedure were also discussed.  Bard Power PICC patient education guide, fact sheet on infection prevention and patient information card has been provided to patient /or left at bedside.    PICC Placement Documentation  PICC Double Lumen 75/64/33 Right Basilic 37 cm 0 cm (Active)  Indication for Insertion or Continuance of Line Limited venous access - need for IV therapy >5 days (PICC only) 03/12/22 1930  Exposed Catheter (cm) 0 cm 03/12/22 1930  Site Assessment Clean, Dry, Intact 03/12/22 1930  Lumen #1 Status Saline locked;Flushed;Blood return noted 03/12/22 1930  Lumen #2 Status Saline locked;Flushed;Blood return noted 03/12/22 1930  Dressing Type Transparent;Securing device 03/12/22 1930  Dressing Status Antimicrobial disc in place;Clean, Dry, Intact 03/12/22 [place] Not Applicable 29/51/88 4166  Line Care Connections checked and tightened 03/12/22 1930  Line Adjustment (NICU/IV Team Only) No 03/12/22 1930  Dressing Intervention New dressing 03/12/22 1930  Dressing Change Due 03/19/22 03/12/22 1930       Shirley Nichols Chenice 03/12/2022, 7:50 PM

## 2022-03-13 ENCOUNTER — Inpatient Hospital Stay (HOSPITAL_COMMUNITY): Payer: PPO

## 2022-03-13 DIAGNOSIS — G9340 Encephalopathy, unspecified: Secondary | ICD-10-CM | POA: Diagnosis not present

## 2022-03-13 DIAGNOSIS — R569 Unspecified convulsions: Secondary | ICD-10-CM | POA: Diagnosis not present

## 2022-03-13 DIAGNOSIS — K831 Obstruction of bile duct: Secondary | ICD-10-CM | POA: Diagnosis not present

## 2022-03-13 DIAGNOSIS — N179 Acute kidney failure, unspecified: Secondary | ICD-10-CM | POA: Diagnosis not present

## 2022-03-13 DIAGNOSIS — E876 Hypokalemia: Secondary | ICD-10-CM | POA: Diagnosis not present

## 2022-03-13 DIAGNOSIS — C801 Malignant (primary) neoplasm, unspecified: Secondary | ICD-10-CM

## 2022-03-13 LAB — COMPREHENSIVE METABOLIC PANEL
ALT: 46 U/L — ABNORMAL HIGH (ref 0–44)
AST: 22 U/L (ref 15–41)
Albumin: 1.7 g/dL — ABNORMAL LOW (ref 3.5–5.0)
Alkaline Phosphatase: 175 U/L — ABNORMAL HIGH (ref 38–126)
Anion gap: 12 (ref 5–15)
BUN: 16 mg/dL (ref 8–23)
CO2: 24 mmol/L (ref 22–32)
Calcium: 8.2 mg/dL — ABNORMAL LOW (ref 8.9–10.3)
Chloride: 103 mmol/L (ref 98–111)
Creatinine, Ser: 1.47 mg/dL — ABNORMAL HIGH (ref 0.44–1.00)
GFR, Estimated: 35 mL/min — ABNORMAL LOW (ref >60)
Glucose, Bld: 89 mg/dL (ref 70–99)
Potassium: 2.9 mmol/L — ABNORMAL LOW (ref 3.5–5.1)
Sodium: 139 mmol/L (ref 135–145)
Total Bilirubin: 0.8 mg/dL (ref 0.3–1.2)
Total Protein: 4.7 g/dL — ABNORMAL LOW (ref 6.5–8.1)

## 2022-03-13 LAB — CBC
HCT: 26.8 % — ABNORMAL LOW (ref 36.0–46.0)
Hemoglobin: 8.7 g/dL — ABNORMAL LOW (ref 12.0–15.0)
MCH: 26 pg (ref 26.0–34.0)
MCHC: 32.5 g/dL (ref 30.0–36.0)
MCV: 80.2 fL (ref 80.0–100.0)
Platelets: 286 10*3/uL (ref 150–400)
RBC: 3.34 MIL/uL — ABNORMAL LOW (ref 3.87–5.11)
RDW: 15.3 % (ref 11.5–15.5)
WBC: 9.8 10*3/uL (ref 4.0–10.5)
nRBC: 0 % (ref 0.0–0.2)

## 2022-03-13 LAB — MENINGITIS/ENCEPHALITIS PANEL (CSF)

## 2022-03-13 LAB — CSF CELL COUNT WITH DIFFERENTIAL
RBC Count, CSF: 0 /mm3
Tube #: 3
WBC, CSF: 1 /mm3 (ref 0–5)

## 2022-03-13 LAB — MAGNESIUM: Magnesium: 1.6 mg/dL — ABNORMAL LOW (ref 1.7–2.4)

## 2022-03-13 LAB — GLUCOSE, CAPILLARY
Glucose-Capillary: 110 mg/dL — ABNORMAL HIGH (ref 70–99)
Glucose-Capillary: 141 mg/dL — ABNORMAL HIGH (ref 70–99)
Glucose-Capillary: 84 mg/dL (ref 70–99)
Glucose-Capillary: 88 mg/dL (ref 70–99)
Glucose-Capillary: 92 mg/dL (ref 70–99)
Glucose-Capillary: 94 mg/dL (ref 70–99)

## 2022-03-13 LAB — PHOSPHORUS: Phosphorus: 3.7 mg/dL (ref 2.5–4.6)

## 2022-03-13 LAB — GLUCOSE, CSF: Glucose, CSF: 56 mg/dL (ref 40–70)

## 2022-03-13 LAB — PROTEIN, CSF: Total  Protein, CSF: 51 mg/dL — ABNORMAL HIGH (ref 15–45)

## 2022-03-13 LAB — CK: Total CK: 24 U/L — ABNORMAL LOW (ref 38–234)

## 2022-03-13 LAB — PROTIME-INR
INR: 1.1 (ref 0.8–1.2)
Prothrombin Time: 14.5 seconds (ref 11.4–15.2)

## 2022-03-13 LAB — APTT: aPTT: 33 seconds (ref 24–36)

## 2022-03-13 MED ORDER — LIDOCAINE HCL (PF) 1 % IJ SOLN
5.0000 mL | Freq: Once | INTRAMUSCULAR | Status: AC
  Administered 2022-03-13: 5 mL via INTRADERMAL

## 2022-03-13 MED ORDER — FENTANYL CITRATE PF 50 MCG/ML IJ SOSY: 100.0000 ug | PREFILLED_SYRINGE | INTRAMUSCULAR | Status: AC

## 2022-03-13 MED ORDER — MIDAZOLAM HCL 2 MG/2ML IJ SOLN
2.0000 mg | INTRAMUSCULAR | Status: AC
  Administered 2022-03-13: 2 mg via INTRAVENOUS

## 2022-03-13 MED ORDER — MIDAZOLAM HCL 2 MG/2ML IJ SOLN: 4.0000 mg | INTRAMUSCULAR | Status: AC

## 2022-03-13 MED ORDER — SODIUM CHLORIDE 0.9 % IV SOLN: 3.0000 g | Freq: Two times a day (BID) | INTRAVENOUS | Status: DC

## 2022-03-13 MED ORDER — ENOXAPARIN SODIUM 30 MG/0.3ML IJ SOSY: 30.0000 mg | PREFILLED_SYRINGE | INTRAMUSCULAR | Status: DC

## 2022-03-13 MED ORDER — MAGNESIUM SULFATE 2 GM/50ML IV SOLN
2.0000 g | INTRAVENOUS | Status: AC
  Administered 2022-03-13: 2 g via INTRAVENOUS
  Filled 2022-03-13: qty 50

## 2022-03-13 MED ORDER — MAGNESIUM SULFATE 4 GM/100ML IV SOLN
4.0000 g | INTRAVENOUS | Status: AC
  Administered 2022-03-13: 4 g via INTRAVENOUS
  Filled 2022-03-13: qty 100

## 2022-03-13 MED ORDER — LEVETIRACETAM IN NACL 1000 MG/100ML IV SOLN
1000.0000 mg | Freq: Once | INTRAVENOUS | Status: AC
  Administered 2022-03-13: 1000 mg via INTRAVENOUS
  Filled 2022-03-13: qty 100

## 2022-03-13 MED ORDER — MIDAZOLAM HCL 2 MG/2ML IJ SOLN
INTRAMUSCULAR | Status: AC
  Administered 2022-03-13: 2 mg via INTRAVENOUS
  Filled 2022-03-13: qty 4

## 2022-03-13 MED ORDER — DEXTROSE IN LACTATED RINGERS 5 % IV SOLN: INTRAVENOUS | Status: AC

## 2022-03-13 MED ORDER — FENTANYL CITRATE (PF) 100 MCG/2ML IJ SOLN
INTRAMUSCULAR | Status: AC
  Administered 2022-03-13: 100 ug
  Filled 2022-03-13: qty 2

## 2022-03-13 MED ORDER — MIDAZOLAM HCL 2 MG/2ML IJ SOLN: 2.0000 mg | Freq: Once | INTRAMUSCULAR | Status: DC

## 2022-03-13 MED ORDER — POTASSIUM CHLORIDE 10 MEQ/50ML IV SOLN
10.0000 meq | INTRAVENOUS | Status: DC
  Administered 2022-03-13 (×3): 10 meq via INTRAVENOUS
  Filled 2022-03-13 (×3): qty 50

## 2022-03-13 MED ORDER — POTASSIUM CHLORIDE 10 MEQ/50ML IV SOLN
10.0000 meq | INTRAVENOUS | Status: AC
  Administered 2022-03-13 (×4): 10 meq via INTRAVENOUS
  Filled 2022-03-13 (×4): qty 50

## 2022-03-13 MED ORDER — MIDAZOLAM HCL 2 MG/2ML IJ SOLN
INTRAMUSCULAR | Status: AC
  Administered 2022-03-13: 4 mg via INTRAVENOUS
  Filled 2022-03-13: qty 4

## 2022-03-13 MED ORDER — FENTANYL CITRATE PF 50 MCG/ML IJ SOSY
PREFILLED_SYRINGE | INTRAMUSCULAR | Status: AC
  Administered 2022-03-13: 100 ug via INTRAVENOUS
  Filled 2022-03-13: qty 2

## 2022-03-13 MED ORDER — MIDAZOLAM HCL 2 MG/2ML IJ SOLN: 2.0000 mg | INTRAMUSCULAR | Status: AC

## 2022-03-13 NOTE — Progress Notes (Signed)
[facility] Progress Note Patient Name: Shirley Nichols DOB: [birth year] MRN: [id number]   Date of Service  03/13/2022  HPI/Events of Note  Notified of electrolyte abnormalities - K 2.9, Mg 1.6. Crea on the rise to 1.47 <-- 1.27.    eICU Interventions  Replete K and Mg.  MgSO4 2g IV ordered.   KCL 81mq x 4 doses ordered.     Intervention Category Intermediate Interventions: Electrolyte abnormality - evaluation and management  VElsie Lincoln10/16/2023, 5:16 AM

## 2022-03-13 NOTE — Procedures (Signed)
Patient Name: Shirley Nichols  MRN: [id number]  Epilepsy Attending: Lora Havens  Referring Physician/Provider: Katy Apo, NP  Duration: 03/12/2022 1721 to 03/13/2022 1721   Patient history: 81yo F with right leg tremoring after the MRI and they also noted that she was talking gibberish all afternoon for 4 to 5 hours. At approximately 1830, she was noted to have right arm and leg jerking with left gaze deviation. EEG to evaluate for seizure   Level of alertness: Awake, asleep   AEDs during EEG study: LEV   Technical aspects: This EEG study was done with scalp electrodes positioned according to the 10-20 International system of electrode placement. Electrical activity was reviewed with band pass filter of 1-'70Hz'$ , sensitivity of 7 uV/mm, display speed of 82m/sec with a '60Hz'$  notched filter applied as appropriate. EEG data were recorded continuously and digitally stored.  Video monitoring was available and reviewed as appropriate.   Description: No clear posterior dominant rhythm was seen. Sleep was characterized by vertex waves, sleep spindles (12 to 14 Hz), maximal frontocentral region. EEG showed continuous generalized and lateralized left hemisphere 3 to 6 Hz theta-delta slowing with overriding 12-'15hz'$  generalized beta activity. Hyperventilation and photic stimulation were not performed.     Event button was pressed on 03/13/2022 at 0958 for right lower extremity rhythmic twitching.  Concomitant EEG before, during and after the event did not show any EEG change.   ABNORMALITY - Continuous slow, generalized and lateralized left hemisphere   IMPRESSION: This study is suggestive of cortical dysfunction arising from left hemisphere likely secondary to underlying structural abnormality, post-ictal state. Additionally there is moderate to severe diffuse encephalopathy, nonspecific etiology. No epileptiform discharges were seen throughout the recording.  Event button was pressed on  03/13/2022 at 0958 for rhythmic right lower extremity twitching without concomitant EEG change.  However, focal motor seizures may not be seen on scalp EEG.  Therefore clinical correlation is recommended.   Nathaniel Wakeley OBarbra Sarks

## 2022-03-13 NOTE — Progress Notes (Signed)
Subjective: She had no further seizures until I was in the room with her at which point she had a focal seizure.  Exam: Vitals:   03/13/22 1138 03/13/22 1140  BP: (!) 142/56 (!) 133/54  Pulse: 79 77  Resp: 15 11  Temp:    SpO2: 93% 94%   Gen: In bed, NAD Resp: non-labored breathing, no acute distress Abd: soft, nt  Neuro: MS: Awake, alert, oriented to place, month, gives year is 98, but agrees when I correct her to 2023. CN: Visual fields full, EOMI Motor: Good strength throughout Sensory: Intact to light touch  Pertinent Labs: Magnesium 1.6(already repleted)  Impression: 82 year old female with recurrent focal seizures.  She has received 1.75 g of Keppra, but is not yet at steady state and therefore I will recommend an additional load of 1 g prior to further adjustments.  The seizure that I witnessed was clinically very focal, involving only the right leg which is a medial focus, and therefore despite the fact that it was not visible on EEG, I would favor considering a seizure based on semiology.  She is undergoing an LP today to assess for carcinomatous meningitis, very low suspicion for infection.  Neurology will continue to follow.  Recommendations: 1) continue Keppra 750 twice daily 2) continue Vimpat 200 twice daily 3) continue LTM EEG   Roland Rack, MD [facility] [phone]  If 7pm- 7am, please page neurology on call as listed in [place].

## 2022-03-13 NOTE — Progress Notes (Signed)
Physical Therapy Treatment Patient Details Name: DEKOTA KIRLIN MRN: [id number] DOB: [birth year] Today's Date: 03/13/2022   History of Present Illness 81 yo female presents [facility] on 10/[facility] with elevated liver enzymes, emesis. Pt recently diagnosed with UTI treated with nitrofurantoin. Ultrasound showing dilated gallbladder and CBD concerning for biliary ductal obstruction. CT of the abdomen and pelvis without IV contrast shows stranding around the pancreatic head concerning for possible pancreatic mass. s/p ERCP with stent placement on 10/8. encephalopathic starting 10/10, suspected aspiration PNA in R lung. Pt with seizures on 10/14 with EEG in place 10/16. PMH includes HTN, HLD.    PT Comments    Pt admitted with above diagnosis. Co rx with ST so they could assess swallowing while pt sat EOB. Pt moving well to come to EOB overall and sitting with min guard assist at EOB for up to 15 min. Pt needed mod assist of 2 to stand.  Pt limited by EEG lines today therefore had to lie pt back down.  Will follow acutely and progress pt as able.  Pt currently with functional limitations due to balance and endurance deficits. Pt will benefit from skilled PT to increase their independence and safety with mobility to allow discharge to the venue listed below.      Recommendations for follow up therapy are one component of a multi-disciplinary discharge planning process, led by the attending physician.  Recommendations may be updated based on patient status, additional functional criteria and insurance authorization.  Follow Up Recommendations  Home health PT     Assistance Recommended at Discharge Frequent or constant Supervision/Assistance  Patient can return home with the following A little help with walking and/or transfers;A little help with bathing/dressing/bathroom;Direct supervision/assist for medications management;Assistance with cooking/housework;Help with stairs or ramp for entrance;Assist for  transportation   Equipment Recommendations  None recommended by PT    Recommendations for Other Services       Precautions / Restrictions Precautions Precautions: Fall Precaution Comments: EEG monitor Restrictions Weight Bearing Restrictions: No     Mobility  Bed Mobility Overal bed mobility: Needs Assistance Bed Mobility: Supine to Sit, Sit to Supine     Supine to sit: Min assist Sit to supine: Min assist   General bed mobility comments: Pt was able to bring LEs to EOB with incr time and just needed guidance to come to EOB.    Transfers Overall transfer level: Needs assistance Equipment used: 2 person hand held assist Transfers: Sit to/from Stand Sit to Stand: Mod assist, +2 physical assistance           General transfer comment: face to face to transition into standing with increased time and cueing to take a few steps to [facility]. Did not progress as pt with EEG monitor in place.    Ambulation/Gait Ambulation/Gait assistance: Min assist   Assistive device: None Gait Pattern/deviations: Step-through pattern, Decreased stride length, Shuffle, Staggering right Gait velocity: decr     General Gait Details: lists R throughout mobility, min steadying assist especially with directional changes   Stairs             Wheelchair Mobility    Modified Rankin (Stroke Patients Only)       Balance Overall balance assessment: Needs assistance Sitting-balance support: No upper extremity supported Sitting balance-Leahy Scale: Fair     Standing balance support: Bilateral upper extremity supported, During functional activity Standing balance-Leahy Scale: Poor Standing balance comment: relies on external support  Cognition Arousal/Alertness: Awake/alert Behavior During Therapy: Flat affect Overall Cognitive Status: Impaired/Different from baseline Area of Impairment: Orientation, Attention, Memory, Following commands,  Safety/judgement, Awareness, Problem solving                 Orientation Level: Disoriented to, Place, Time, Situation Current Attention Level: Sustained Memory: Decreased short-term memory, Decreased recall of precautions Following Commands: Follows one step commands inconsistently, Follows one step commands with increased time Safety/Judgement: Decreased awareness of safety, Decreased awareness of deficits Awareness: Intellectual Problem Solving: Slow processing, Decreased initiation, Difficulty sequencing, Requires verbal cues, Requires tactile cues General Comments: pt reports birthday correctly but said wrong year 3 x before saying correct year for birthday;  She  reports MCH. She has very poor awareness,  following commands with incr time today.  Appears to have some L inattention, gaze preference to R.        Exercises General Exercises - Lower Extremity Ankle Circles/Pumps: AROM, Both, 10 reps, Seated Long Arc Quad: AROM, Both, 10 reps, Seated    General Comments General comments (skin integrity, edema, etc.): VSS on 2LO2      Pertinent Vitals/Pain Pain Assessment Pain Assessment: No/denies pain Breathing: normal Negative Vocalization: none Facial Expression: smiling or inexpressive Body Language: relaxed Consolability: no need to console PAINAD Score: 0    Home Living                          Prior Function            PT Goals (current goals can now be found in the care plan section) Progress towards PT goals: Progressing toward goals    Frequency    Min 3X/week      PT Plan Current plan remains appropriate    Co-evaluation PT/OT/SLP Co-Evaluation/Treatment: Yes Reason for Co-Treatment: Complexity of the patient's impairments (multi-system involvement) PT goals addressed during session: Mobility/safety with mobility        AM-PAC PT "6 Clicks" Mobility   Outcome Measure  Help needed turning from your back to your side while in a  flat bed without using bedrails?: A Little Help needed moving from lying on your back to sitting on the side of a flat bed without using bedrails?: A Little Help needed moving to and from a bed to a chair (including a wheelchair)?: Total Help needed standing up from a chair using your arms (e.g., wheelchair or bedside chair)?: Total Help needed to walk in hospital room?: Total Help needed climbing 3-5 steps with a railing? : Total 6 Click Score: 10    End of Session Equipment Utilized During Treatment: Gait belt;Oxygen Activity Tolerance: Patient tolerated treatment well;Patient limited by fatigue Patient left: in bed;with call bell/phone within reach;with bed alarm set;with family/visitor present Nurse Communication: Mobility status PT Visit Diagnosis: Other abnormalities of gait and mobility (R26.89)     Time: 1001-1027 PT Time Calculation (min) (ACUTE ONLY): 26 min  Charges:  $Therapeutic Activity: 8-22 mins                     Allyn Bartelson M,PT Acute Re[facility] [place] 03/13/2022, 11:18 AM

## 2022-03-13 NOTE — Progress Notes (Signed)
NAME:  Shirley Nichols, MRN:  [id number], DOB:  [birth year], LOS: 41 ADMISSION DATE:  03/03/2022, CONSULTATION DATE:  03/12/2022 REFERRING MD:  Meredith Mody, CHIEF COMPLAINT:  Seizure   History of Present Illness:  81 year old-woman admitted to [facility] 10/6 for elevated liver enzymes and AKI with ultrasound showing CBD obstruction and pancreatic mass confirmed on MRCP.  She was transferred to [facility] and underwent ERCP stent placement on 10/8 with biopsy confirming adenocarcinoma.  She had postprocedure pancreatitis and developed increasing confusion on 10/10 with delirium on 10/13, MRI was negative.  She had witnessed focal right-sided seizure with left gaze preference, head CT was negative for CVA.  She was given 8 mg of Ativan in divided doses, loaded with Keppra and transferred to the ICU for LTM EEG  Pertinent  Medical History  HTN, HLD, Tobacco use   Significant Hospital Events: Including procedures, antibiotic start and stop dates in addition to other pertinent events   10/6-transferred to [facility] 10/7 and 10/8-GI Dr. Benson Norway consulted ERCP stent placement 10/9-post ERCP pancreatitis with AKI, pain and discomfort placed on pain meds 10/10 pancreatitis resolved but increasing confusion 10/12 Seen by Optho--unlikely ocular issue realted to visual changes 10/13 Delirious--MRI brain no lesion of casue for visual issues. PCCM consulted, transferred to ICU for cEEG and further monitoring 10/14  more awake and responsive  10/15 more focal seizures, reconsult  Interim History / Subjective:  Overngiht, no more seizure like activity. Patient is very somnolent on exam. Patient has a hard time waking up. She can follow commands, but is very sleeping. Patient requires frequent reorientation.   Objective   Blood pressure (!) 140/58, pulse 64, temperature 97.8 F (36.6 C), temperature source Oral, resp. rate 15, SpO2 96 %.        Intake/Output Summary (Last 24 hours) at 03/13/2022 0640 Last  data filed at 03/13/2022 4098 Gross per 24 hour  Intake 1430.01 ml  Output 2950 ml  Net -1519.99 ml   There were no vitals filed for this visit.  Examination:  General: Patient is resting in bed with nasal cannula in place Eyes: Patinet is not able to track past midline. Pupils are equal and reactive to light.  Head: Normocephalic, atraumatic  Neck: Stiff neck with limited range of motion appreciated.  Cardio: Regular rate and rhythm, no murmurs, rubs or gallops. 2 + pulse noted to bilateral upper and lower extremities  Chest: No chest tenderness Pulmonary: Clear to ausculation bilaterally with no rales, rhonchi, and crackles  Abdomen: Soft, nontender with normoactive bowel sounds  Neuro: Alert and orientated x1. Needs frequent reorientation. Patient not able to track past midline. CN II-XII intact.   Resolved Hospital Problem list   Post ERCP pancreatitis   Assessment & Plan:  This is a 81 year old female with newly diagnosed pancreatic cancer who presented back to PCCM with seizure like activity again. Patient admitted to ICU for further treatment and work up of seizure like activity.   #Acute Encephalopathy/ Focal seizure like activity  Patient is very somnolent on exam this morning. Unsure what her baseline is as this is the first time that I am meeting her. Patient did not have any seizure like activity overnight. Patient on exam is very somnolent and is not waking up appropriately. Patient need frequent reorientation. Patient has has decreased ROM in neck. There is concern of possible infectious etiology. Structural cause has been ruled out per MRI. Other etiology could be hypomagnesemia. Toxic etiology is less likely. While  neurology was evaluating patient this AM, Patient had seizure like activity which was not shown on the EEG. Neurology is concerned about LP cytology to see if there is mets to the brain.  -LP today -Neurology following, appreciate recs -Continue Keppra '750mg'$   BID -Continue Vimpat 200 mg BID -Frequent neuro checks  -There is consideration to switch to dilantin from vimpat, per neurology   #Aspiration pneumonia  #Suspected COPD No acute concerns for shortness ofd breath today. Patient has no qhite count, oxygen requirement is not increasing. Patient has had treatment for five day now, consider stopping Unasyn today.  -Stop unasyn today -Continue Brovana and Pulmicort  -Wean back down to room air with Oxygen goal >92%  #Pancreatic adenocarcinoma  #Post ERCP pancreatitis, resolved  Liver function tests are returning back to normal. AST 22 and ALT 46.  -CTM -Outpatient follow up   #Hypokalemia  #Hypomagnesemia  K this AM 2.9 and Mag 1.6. This low mag could be another etiology of seizures. Will replete aggressively.  -Replete and recheck  -Mag goal >2.0 -K goal >4.0  #Elevated creatinine   #?CKD 3b Unsure of baseline. Patient came in with elevate creatinine at 2.81. Crt is down to 1.47 during admission. GFR 35. This could be baseline for patient as there is no baseline documented.  -Continue to monitor  -Give maintenance fluids   Best Practice (right click and "Reselect all SmartList Selections" daily)   Diet/type: NPO DVT prophylaxis: Lovenox  GI prophylaxis: N/A Lines: N/A Foley:  Yes, and it is still needed Code Status:  full code  Labs   CBC: Recent Labs  Lab 03/09/22 0405 03/10/22 0407 03/11/22 0647 03/12/22 1837 03/13/22 0411  WBC 12.8* 10.9* 7.8 10.5 9.8  NEUTROABS  --   --   --  8.2*  --   HGB 9.8* 9.5* 8.7* 9.9* 8.7*  HCT 28.2* 27.7* 26.3* 30.3* 26.8*  MCV 77.5* 78.5* 80.4 80.2 80.2  PLT 252 260 246 327 761    Basic Metabolic Panel: Recent Labs  Lab 03/09/22 0405 03/10/22 0407 03/10/22 2257 03/11/22 0647 03/12/22 0034 03/12/22 1837 03/13/22 0411  NA 135 140  --  135  --  135 139  K 4.2 4.6  --  3.8  --  3.5 2.9*  CL 104 110  --  108  --  104 103  CO2 19* 21*  --  21*  --  22 24  GLUCOSE 83 100*  --   107*  --  111* 89  BUN 30* 30*  --  28*  --  19 16  CREATININE 1.43* 1.36*  --  1.29*  --  1.27* 1.47*  CALCIUM 7.8* 7.8*  --  7.5*  --  8.2* 8.2*  MG 1.8 1.5* 1.6* 1.9 1.5* 1.8 1.6*  PHOS  --   --   --   --   --   --  3.7   GFR: Estimated Creatinine Clearance: 27.5 mL/min (A) (by C-G formula based on SCr of 1.47 mg/dL (H)). Recent Labs  Lab 03/08/22 0932 03/08/22 1232 03/09/22 0405 03/10/22 0407 03/11/22 0647 03/12/22 1837 03/13/22 0411  WBC  --   --    < > 10.9* 7.8 10.5 9.8  LATICACIDVEN 0.8 0.6  --   --   --   --   --    < > = values in this interval not displayed.    Liver Function Tests: Recent Labs  Lab 03/09/22 0405 03/10/22 0407 03/11/22 9509 03/12/22 1837 03/13/22 0411  AST 56* 57* 32 28 22  ALT 104* 95* 70* 53* 46*  ALKPHOS 346* 289* 224* 199* 175*  BILITOT 1.2 1.2 0.8 0.7 0.8  PROT 5.1* 5.1* 4.7* 5.4* 4.7*  ALBUMIN 1.9* 1.9* 1.8* 1.9* 1.7*   Recent Labs  Lab 03/07/22 0240 03/08/22 0319  LIPASE 163* 35   Recent Labs  Lab 03/08/22 0704  AMMONIA 33    ABG    Component Value Date/Time   HCO3 25.3 03/08/2022 0704   O2SAT 92 03/08/2022 0704     Coagulation Profile: Recent Labs  Lab 03/08/22 0704 03/12/22 1837  INR 1.2 1.1    Cardiac Enzymes: No results for input(s): "CKTOTAL", "CKMB", "CKMBINDEX", "TROPONINI" in the last 168 hours.  HbA1C: No results found for: "HGBA1C"  CBG: Recent Labs  Lab 03/12/22 1058 03/12/22 1506 03/12/22 1935 03/12/22 2318 03/13/22 0348  GLUCAP 98 104* 80 97 94    Review of Systems:   Negative except for what is state in HPI   Past Medical History:  She,  has a past medical history of Cancer ([place]), Hyperlipidemia, and Hypertension.   Surgical History:   Past Surgical History:  Procedure Laterality Date   ABDOMINAL HYSTERECTOMY     BILIARY BRUSHING  03/05/2022   Procedure: BILIARY BRUSHING;  Surgeon: Carol Ada, MD;  Location: [facility] ENDOSCOPY;  Service: Gastroenterology;;   BILIARY STENT  PLACEMENT  03/05/2022   Procedure: BILIARY STENT PLACEMENT;  Surgeon: Carol Ada, MD;  Location: [facility] ENDOSCOPY;  Service: Gastroenterology;;   COLONOSCOPY     02/21/2002 adenomatous polyp, 03/23/2011   COLONOSCOPY W/ POLYPECTOMY  02/2000   TA polyp removed from ascending colon.  performed through [facility] (? affiliate in [place])   COLONOSCOPY WITH PROPOFOL N/A 11/03/2016   Procedure: COLONOSCOPY WITH PROPOFOL;  Surgeon: Manya Silvas, MD;  Location: [facility] ENDOSCOPY;  Service: Endoscopy;  Laterality: N/A;   ERCP N/A 03/05/2022   Procedure: ENDOSCOPIC RETROGRADE CHOLANGIOPANCREATOGRAPHY (ERCP);  Surgeon: Carol Ada, MD;  Location: [facility];  Service: Gastroenterology;  Laterality: N/A;   HEMORROIDECTOMY     TONSILLECTOMY       Social History:   reports that she has quit smoking. She has never used smokeless tobacco. She reports that she does not drink alcohol and does not use drugs.   Family History:  Her family history is negative for Breast cancer.   Allergies No Known Allergies   Home Medications  Prior to Admission medications   Medication Sig Start Date End Date Taking? Authorizing Provider  SYMBICORT 160-4.5 MCG/ACT inhaler Inhale 2 puffs into the lungs as needed (for wheezing or shortness of breath). 02/05/16  Yes [provider]  aspirin EC 81 MG tablet Take 81 mg by mouth daily.    [provider]  ezetimibe (ZETIA) 10 MG tablet Take 10 mg by mouth daily. 12/12/21   [provider]  fluvastatin XL (LESCOL XL) 80 MG 24 hr tablet Take 80 mg by mouth daily. 02/19/16   [provider]  valsartan-hydrochlorothiazide (DIOVAN-HCT) 320-25 MG tablet Take 1 tablet by mouth daily. 01/28/16   [provider]     Critical care time: [place], DO Internal Medicine Resident PGY-1 Pager: [phone]

## 2022-03-13 NOTE — Progress Notes (Signed)
Speech Language Pathology Treatment: Dysphagia  Patient Details Name: Shirley Nichols MRN: [id number] DOB: June 27, 1940 Today's Date: 03/13/2022 Time: 1003-1020 SLP Time Calculation (min) (ACUTE ONLY): 17 min  Assessment / Plan / Recommendation Clinical Impression  Pt was seen with PT to maximize level of alertness and mentation. She sat EOB while she tried some water and graham crackers (RN confirmed with MD, pt did not need to be NPO pending LP). Oropharyngeal swallow appears to be generally functional. She does have some reduced awareness but more so pertaining to self-feeding (dropping graham cracker from R hand to reach for her straw and not realizing at first that she had done that, but then asking if she dropped her cracker). Although swallowing function appears to be adequate to resume regular solids and thin liquids, this has the potential to fluctuate based upon mentation particularly if she continues to have seizure activity. She will also have to lie flat after her LP. Discussed with RN, who agrees with plan to initiate regular solids and thin liquids only under supervision of RN in order to assess for appropriate status for POs. SLP will also continue to follow. Although MRI negative, recommend considering SLP cognitive-linguistic eval particularly if cognitive deficits persist beyond post-ictal phase.    HPI HPI: 81 yo female [facility] with elevated liver enzymes, emesis. Pt recently diagnosed with UTI treated with nitrofurantoin. Ultrasound showing dilated gallbladder and CBD concerning for biliary ductal obstruction. CT of the abdomen and pelvis without IV contrast shows stranding around the pancreatic head concerning for possible pancreatic mass. s/p ERCP with stent placement on 10/8. Encephalopathic starting 10/10, suspected aspiration PNA in R lung. MRI pending. PMH includes HTN, HLD.      SLP Plan  Continue with current plan of care      Recommendations for follow  up therapy are one component of a multi-disciplinary discharge planning process, led by the attending physician.  Recommendations may be updated based on patient status, additional functional criteria and insurance authorization.    Recommendations  Diet recommendations: Regular;Thin liquid Liquids provided via: Cup;Straw Medication Administration: Whole meds with liquid Supervision: Patient able to self feed;Full supervision/cueing for compensatory strategies Compensations: Slow rate;Small sips/bites;Minimize environmental distractions Postural Changes and/or Swallow Maneuvers: Seated upright 90 degrees;Upright 30-60 min after meal                Oral Care Recommendations: Oral care BID Follow Up Recommendations: Other (comment) (none anticipated for dysphagia - question cognitive needs) Assistance recommended at discharge: Frequent or constant Supervision/Assistance SLP Visit Diagnosis: Dysphagia, unspecified (R13.10) Plan: Continue with current plan of care           Osie Bond., M.A. [place] Office [phone]  Secure chat preferred   03/13/2022, 10:27 AM

## 2022-03-13 NOTE — Procedures (Signed)
Technically successful fluoro guided LP at L5-S1 level with opening pressure of 36 cm H2O and closing pressure of 18 H2O 15 cc of clear CSF sent to lab for analysis.  No immediate post procedural complication.  Please see imaging section of Epic for full dictation.    Reatha Armour, PA-C 03/13/2022, 3:05 PM

## 2022-03-13 NOTE — Progress Notes (Signed)
LTM maint complete - no skin breakdown under:FZ, O2

## 2022-03-14 ENCOUNTER — Inpatient Hospital Stay (HOSPITAL_COMMUNITY): Payer: PPO

## 2022-03-14 DIAGNOSIS — R7989 Other specified abnormal findings of blood chemistry: Secondary | ICD-10-CM

## 2022-03-14 DIAGNOSIS — J9601 Acute respiratory failure with hypoxia: Secondary | ICD-10-CM | POA: Diagnosis not present

## 2022-03-14 DIAGNOSIS — C250 Malignant neoplasm of head of pancreas: Secondary | ICD-10-CM | POA: Diagnosis not present

## 2022-03-14 DIAGNOSIS — R569 Unspecified convulsions: Secondary | ICD-10-CM | POA: Diagnosis not present

## 2022-03-14 DIAGNOSIS — C801 Malignant (primary) neoplasm, unspecified: Secondary | ICD-10-CM | POA: Diagnosis not present

## 2022-03-14 DIAGNOSIS — N179 Acute kidney failure, unspecified: Secondary | ICD-10-CM | POA: Diagnosis not present

## 2022-03-14 DIAGNOSIS — K831 Obstruction of bile duct: Secondary | ICD-10-CM | POA: Diagnosis not present

## 2022-03-14 DIAGNOSIS — G9340 Encephalopathy, unspecified: Secondary | ICD-10-CM | POA: Diagnosis not present

## 2022-03-14 LAB — MAGNESIUM: Magnesium: 2 mg/dL (ref 1.7–2.4)

## 2022-03-14 LAB — GLUCOSE, CAPILLARY
Glucose-Capillary: 100 mg/dL — ABNORMAL HIGH (ref 70–99)
Glucose-Capillary: 118 mg/dL — ABNORMAL HIGH (ref 70–99)
Glucose-Capillary: 124 mg/dL — ABNORMAL HIGH (ref 70–99)
Glucose-Capillary: 164 mg/dL — ABNORMAL HIGH (ref 70–99)
Glucose-Capillary: 166 mg/dL — ABNORMAL HIGH (ref 70–99)
Glucose-Capillary: 168 mg/dL — ABNORMAL HIGH (ref 70–99)

## 2022-03-14 LAB — COMPREHENSIVE METABOLIC PANEL
ALT: 138 U/L — ABNORMAL HIGH (ref 0–44)
ALT: 179 U/L — ABNORMAL HIGH (ref 0–44)
AST: 194 U/L — ABNORMAL HIGH (ref 15–41)
AST: 225 U/L — ABNORMAL HIGH (ref 15–41)
Albumin: 1.7 g/dL — ABNORMAL LOW (ref 3.5–5.0)
Albumin: 2 g/dL — ABNORMAL LOW (ref 3.5–5.0)
Alkaline Phosphatase: 706 U/L — ABNORMAL HIGH (ref 38–126)
Alkaline Phosphatase: 874 U/L — ABNORMAL HIGH (ref 38–126)
Anion gap: 6 (ref 5–15)
Anion gap: 10 (ref 5–15)
BUN: 11 mg/dL (ref 8–23)
BUN: 12 mg/dL (ref 8–23)
CO2: 22 mmol/L (ref 22–32)
CO2: 24 mmol/L (ref 22–32)
Calcium: 7.6 mg/dL — ABNORMAL LOW (ref 8.9–10.3)
Calcium: 8.5 mg/dL — ABNORMAL LOW (ref 8.9–10.3)
Chloride: 107 mmol/L (ref 98–111)
Chloride: 102 mmol/L (ref 98–111)
Creatinine, Ser: 1.11 mg/dL — ABNORMAL HIGH (ref 0.44–1.00)
Creatinine, Ser: 1.31 mg/dL — ABNORMAL HIGH (ref 0.44–1.00)
GFR, Estimated: 50 mL/min — ABNORMAL LOW (ref >60)
GFR, Estimated: 41 mL/min — ABNORMAL LOW (ref >60)
Glucose, Bld: 332 mg/dL — ABNORMAL HIGH (ref 70–99)
Glucose, Bld: 146 mg/dL — ABNORMAL HIGH (ref 70–99)
Potassium: 3.5 mmol/L (ref 3.5–5.1)
Potassium: 4.5 mmol/L (ref 3.5–5.1)
Sodium: 135 mmol/L (ref 135–145)
Sodium: 136 mmol/L (ref 135–145)
Total Bilirubin: 1.9 mg/dL — ABNORMAL HIGH (ref 0.3–1.2)
Total Bilirubin: 2.5 mg/dL — ABNORMAL HIGH (ref 0.3–1.2)
Total Protein: 4.5 g/dL — ABNORMAL LOW (ref 6.5–8.1)
Total Protein: 5.6 g/dL — ABNORMAL LOW (ref 6.5–8.1)

## 2022-03-14 LAB — BILIRUBIN, DIRECT: Bilirubin, Direct: 1.9 mg/dL — ABNORMAL HIGH (ref 0.0–0.2)

## 2022-03-14 LAB — CBC
HCT: 26.4 % — ABNORMAL LOW (ref 36.0–46.0)
Hemoglobin: 8.9 g/dL — ABNORMAL LOW (ref 12.0–15.0)
MCH: 26.9 pg (ref 26.0–34.0)
MCHC: 33.7 g/dL (ref 30.0–36.0)
MCV: 79.8 fL — ABNORMAL LOW (ref 80.0–100.0)
Platelets: 276 10*3/uL (ref 150–400)
RBC: 3.31 MIL/uL — ABNORMAL LOW (ref 3.87–5.11)
RDW: 15.3 % (ref 11.5–15.5)
WBC: 12.6 10*3/uL — ABNORMAL HIGH (ref 4.0–10.5)
nRBC: 0 % (ref 0.0–0.2)

## 2022-03-14 LAB — PROTIME-INR
INR: 1.3 — ABNORMAL HIGH (ref 0.8–1.2)
Prothrombin Time: 15.7 seconds — ABNORMAL HIGH (ref 11.4–15.2)

## 2022-03-14 LAB — POCT I-STAT 7, (LYTES, BLD GAS, ICA,H+H)
Acid-Base Excess: 2 mmol/L (ref 0.0–2.0)
Bicarbonate: 26.9 mmol/L (ref 20.0–28.0)
Calcium, Ion: 1.2 mmol/L (ref 1.15–1.40)
HCT: 25 % — ABNORMAL LOW (ref 36.0–46.0)
Hemoglobin: 8.5 g/dL — ABNORMAL LOW (ref 12.0–15.0)
O2 Saturation: 100 %
Potassium: 3.7 mmol/L (ref 3.5–5.1)
Sodium: 134 mmol/L — ABNORMAL LOW (ref 135–145)
TCO2: 28 mmol/L (ref 22–32)
pCO2 arterial: 42.6 mmHg (ref 32–48)
pH, Arterial: 7.408 (ref 7.35–7.45)
pO2, Arterial: 280 mmHg — ABNORMAL HIGH (ref 83–108)

## 2022-03-14 LAB — PHOSPHORUS: Phosphorus: 3.3 mg/dL (ref 2.5–4.6)

## 2022-03-14 LAB — PREALBUMIN: Prealbumin: 10 mg/dL — ABNORMAL LOW (ref 18–38)

## 2022-03-14 LAB — AMMONIA: Ammonia: 24 umol/L (ref 9–35)

## 2022-03-14 MED ORDER — SODIUM CHLORIDE 0.9 % IV SOLN
100.0000 mg | Freq: Three times a day (TID) | INTRAVENOUS | Status: DC
  Filled 2022-03-14 (×2): qty 2

## 2022-03-14 MED ORDER — POTASSIUM CHLORIDE 20 MEQ PO PACK
40.0000 meq | PACK | Freq: Two times a day (BID) | ORAL | Status: AC
  Administered 2022-03-14 (×2): 40 meq via ORAL
  Filled 2022-03-14 (×2): qty 2

## 2022-03-14 MED ORDER — ENOXAPARIN SODIUM 40 MG/0.4ML IJ SOSY
40.0000 mg | PREFILLED_SYRINGE | INTRAMUSCULAR | Status: DC
  Administered 2022-03-14 – 2022-03-16 (×3): 40 mg via SUBCUTANEOUS
  Filled 2022-03-14 (×3): qty 0.4

## 2022-03-14 MED ORDER — ETOMIDATE 2 MG/ML IV SOLN: 0.3000 mg/kg | Freq: Once | INTRAVENOUS | Status: AC

## 2022-03-14 MED ORDER — FENTANYL CITRATE PF 50 MCG/ML IJ SOSY
25.0000 ug | PREFILLED_SYRINGE | INTRAMUSCULAR | Status: DC | PRN
  Administered 2022-03-14: 100 ug via INTRAVENOUS
  Administered 2022-03-15: 50 ug via INTRAVENOUS
  Administered 2022-03-15: 100 ug via INTRAVENOUS
  Administered 2022-03-16 (×2): 50 ug via INTRAVENOUS
  Filled 2022-03-14 (×2): qty 2

## 2022-03-14 MED ORDER — ORAL CARE MOUTH RINSE
15.0000 mL | OROMUCOSAL | Status: DC
  Administered 2022-03-14 – 2022-03-17 (×29): 15 mL via OROMUCOSAL

## 2022-03-14 MED ORDER — PROPOFOL 1000 MG/100ML IV EMUL
INTRAVENOUS | Status: AC
  Administered 2022-03-14: 10 ug/kg/min via INTRAVENOUS
  Filled 2022-03-14: qty 100

## 2022-03-14 MED ORDER — PHENYTOIN SODIUM 50 MG/ML IJ SOLN: 100.0000 mg | Freq: Three times a day (TID) | INTRAMUSCULAR | Status: DC

## 2022-03-14 MED ORDER — FENTANYL CITRATE PF 50 MCG/ML IJ SOSY: 25.0000 ug | PREFILLED_SYRINGE | INTRAMUSCULAR | Status: DC | PRN

## 2022-03-14 MED ORDER — ROCURONIUM BROMIDE 50 MG/5ML IV SOLN: 1.0000 mg/kg | Freq: Once | INTRAVENOUS | Status: AC

## 2022-03-14 MED ORDER — SODIUM CHLORIDE 0.9 % IV SOLN
20.0000 mg/kg | Freq: Once | INTRAVENOUS | Status: AC
  Administered 2022-03-14: 1316 mg via INTRAVENOUS
  Filled 2022-03-14: qty 26.32

## 2022-03-14 MED ORDER — POLYETHYLENE GLYCOL 3350 17 G PO PACK
17.0000 g | PACK | Freq: Every day | ORAL | Status: DC
  Administered 2022-03-14 – 2022-03-23 (×6): 17 g
  Filled 2022-03-14 (×6): qty 1

## 2022-03-14 MED ORDER — PANTOPRAZOLE SODIUM 40 MG PO TBEC: 40.0000 mg | DELAYED_RELEASE_TABLET | Freq: Every day | ORAL | Status: DC

## 2022-03-14 MED ORDER — PANTOPRAZOLE 2 MG/ML SUSPENSION
40.0000 mg | Freq: Every day | ORAL | Status: DC
  Administered 2022-03-14 – 2022-03-17 (×3): 40 mg
  Filled 2022-03-14 (×4): qty 20

## 2022-03-14 MED ORDER — ETOMIDATE 2 MG/ML IV SOLN
INTRAVENOUS | Status: AC
  Administered 2022-03-14: 20 mg via INTRAVENOUS
  Filled 2022-03-14: qty 20

## 2022-03-14 MED ORDER — SODIUM CHLORIDE 0.9 % IV SOLN
100.0000 mg | Freq: Three times a day (TID) | INTRAVENOUS | Status: DC
  Administered 2022-03-14 – 2022-03-15 (×5): 100 mg via INTRAVENOUS
  Filled 2022-03-14 (×7): qty 2

## 2022-03-14 MED ORDER — ORAL CARE MOUTH RINSE: 15.0000 mL | OROMUCOSAL | Status: DC | PRN

## 2022-03-14 MED ORDER — DOCUSATE SODIUM 50 MG/5ML PO LIQD
100.0000 mg | Freq: Two times a day (BID) | ORAL | Status: DC
  Administered 2022-03-14 – 2022-03-16 (×3): 100 mg
  Filled 2022-03-14 (×3): qty 10

## 2022-03-14 MED ORDER — MAGNESIUM SULFATE 2 GM/50ML IV SOLN
2.0000 g | Freq: Once | INTRAVENOUS | Status: AC
  Administered 2022-03-14: 2 g via INTRAVENOUS
  Filled 2022-03-14: qty 50

## 2022-03-14 MED ORDER — PROPOFOL 1000 MG/100ML IV EMUL
0.0000 ug/kg/min | INTRAVENOUS | Status: DC
  Administered 2022-03-15: 5 ug/kg/min via INTRAVENOUS
  Filled 2022-03-14: qty 100

## 2022-03-14 MED ORDER — ROCURONIUM BROMIDE 10 MG/ML (PF) SYRINGE
PREFILLED_SYRINGE | INTRAVENOUS | Status: AC
  Administered 2022-03-14: 100 mg via INTRAVENOUS
  Filled 2022-03-14: qty 10

## 2022-03-14 NOTE — Procedures (Addendum)
Patient Name: Shirley Nichols  MRN: [id number]  Epilepsy Attending: Lora Havens  Referring Physician/Provider: Katy Apo, NP  Duration: 03/13/2022 1721 to 03/14/2022 1120   Patient history: 81yo F with right leg tremoring after the MRI and they also noted that she was talking gibberish all afternoon for 4 to 5 hours. At approximately 1830, she was noted to have right arm and leg jerking with left gaze deviation. EEG to evaluate for seizure   Level of alertness: Awake, asleep   AEDs during EEG study: LEV   Technical aspects: This EEG study was done with scalp electrodes positioned according to the 10-20 International system of electrode placement. Electrical activity was reviewed with band pass filter of 1-'70Hz'$ , sensitivity of 7 uV/mm, display speed of 60m/sec with a '60Hz'$  notched filter applied as appropriate. EEG data were recorded continuously and digitally stored.  Video monitoring was available and reviewed as appropriate.   Description: No clear posterior dominant rhythm was seen. Sleep was characterized by vertex waves, sleep spindles (12 to 14 Hz), maximal frontocentral region. EEG showed continuous generalized and lateralized left hemisphere 3 to 6 Hz theta-delta slowing. Hyperventilation and photic stimulation were not performed.     Event button was pressed on 03/14/2022 at 0938 for right lower extremity rhythmic twitching and upward gaze deviation.  Concomitant EEG before, during and after the event did not show any EEG change.  ABNORMALITY - Continuous slow, generalized and lateralized left hemisphere   IMPRESSION: This study is suggestive of cortical dysfunction arising from left hemisphere likely secondary to underlying structural abnormality, post-ictal state. Additionally there is moderate to severe diffuse encephalopathy, nonspecific etiology. No epileptiform discharges were seen throughout the recording.  Event button was pressed on 03/14/2022 at 0938 for  rhythmic right lower extremity twitching and upward gaze deviation without concomitant EEG change. However, focal motor seizures may not be seen on scalp EEG. Therefore clinical correlation is recommended.     Meredyth Hornung OBarbra Sarks

## 2022-03-14 NOTE — Progress Notes (Signed)
Subjective: She had several episodes of focal motor activity, but no clear EEG change. 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  The EEG is negative, but based on semiology I suspect a mesial frontal focus, hence leg predominance. With a deep focus, surface EEG can be negative.   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   Recommendations: 1) continue Keppra 750 twice daily 2) change vimpat to dilantin. Already recievd dose of vimpat this morning, will start dilantin with 20 PE/kg load, then '100mg'$  TID.  3) I don't think that the EEG is adding much at this point, will discontinue.    Roland Rack, MD [facility] [phone]  If 7pm- 7am, please page neurology on call as listed in [place].

## 2022-03-14 NOTE — Progress Notes (Signed)
LTM EEG discontinued - no skin breakdown at unhook.   

## 2022-03-14 NOTE — Procedures (Signed)
Intubation Procedure Note  MAYLINE DRAGON  [id number]  [birth year]  Date:03/14/22  Time:4:11 PM   Provider Performing:Needham Biggins Posey Pronto    Procedure: Intubation (31500)  Indication(s) Respiratory Failure  Consent Risks of the procedure as well as the alternatives and risks of each were explained to the patient and/or caregiver.  Consent for the procedure was obtained and is signed in the bedside chart   Anesthesia Etomidate and Rocuronium   Time Out Verified patient identification, verified procedure, site/side was marked, verified correct patient position, special equipment/implants available, medications/allergies/relevant history reviewed, required imaging and test results available.   Sterile Technique Usual hand hygeine, masks, and gloves were used   Procedure Description Patient positioned in bed supine.  Sedation given as noted above.  Patient was intubated with endotracheal tube using Glidescope.  View was Grade 1 full glottis .  Number of attempts was 1.  Colorimetric CO2 detector was consistent with tracheal placement.   Complications/Tolerance None; patient tolerated the procedure well. Chest X-ray is ordered to verify placement.   EBL Minimal   Specimen(s) None

## 2022-03-14 NOTE — Procedures (Signed)
Bedside Bronchoscopy Procedure Note Shirley Nichols [id number] 12-17-1940  Procedure: Bronchoscopy Indications: Diagnostic evaluation of the airways and Obtain specimens for culture and/or other diagnostic studies  Procedure Details: ET Tube Size: ET Tube secured at lip (cm): Bite block in place: No In preparation for procedure, Patient hyper-oxygenated with 100 % FiO2 Airway entered and the following bronchi were examined: RUL, RML, RLL, LUL, and LLL.   Bronchoscope removed.    Evaluation BP (!) 126/102   Pulse 83   Temp (!) 97.1 F (36.2 C) (Axillary)   Resp 15   SpO2 97%  Breath Sounds:Clear and Diminished O2 sats: stable throughout Patient's Current Condition: stable Specimens:  Sent serosanguinous fluid Complications: No apparent complications Patient did tolerate procedure well.   Jorje Guild 03/14/2022, 4:21 PM

## 2022-03-14 NOTE — Consult Note (Addendum)
Follow up note  Referring Provider:   [facility] Primary Care Physician:  Remi Haggard, FNP Primary Gastroenterologist:  Dr. 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  Admitting AST was 188, went down to 22 on 03/13/22 increased to 194 and 225 Patient's also had elevation of ALT to 138 and 179 up from 46. Patient's admitting bilirubin on 10/6 was 5.1, got down to 0.8 currently at 2.5. Concerning for possible restenosis or stent migration.  Patient is also been on several medications for seizures which could be contributing to liver function.  Focal status epilepticus and acute encephalopathy MRI and head CT negative Unremarkable Neurology following on Keppra and Vimpat Unable to obtain LP, IR consulted.  Aspiration pneumonia Completed antibiotics, still on 2 L nasal cannula.  AKI on CKD stage IIIb   LOS: 11 days     Plan   -Concerning for reobstruction of metal stent or migration. -Possible could also be from medications with elevated alk phos, AST and ALT minimally elevated bilirubin. -Trend LFTs, get  direct bilirubin. -Get INR and ammonia, some clonus on exam.  -We will get KUB to evaluate metal stent. -With history of focal seizures and AMS, may need help with airway, will talk with Dr. Fuller Plan. -Did talk with husband Mortimer Fries who is in the room, and discussed potential options and did consent to another ERCP if bilirubin keeps increasing, stent migration, or believe this is another obstruction.  Patient's husband  understands risk of another acute pancreatitis, and prolonged hospitalization. Consent in endo unit.  -Patient on Lovenox once daily, will hold for possible procedure.  Thank you for your kind consultation, we will continue to follow.    Attending Physician Note   I have taken an interval history, reviewed the chart and examined the patient. I performed a substantive portion of this encounter, including complete performance of at least one of the key components, in conjunction with the APP. I agree with the APP's note, impression and recommendations with my edits. My additional impressions and recommendations are as follows.   Pancreatic adenocarcinoma with biliary obstruction S/P ERCP with covered metal stent placed on 10/8. LFTs improved however they are now rising concerning for recurrent biliary obstruction - stent obstruction or migration. KUB today shows the stent in a similar position compared to ERCP films. Currently no evidence for cholangitis or obstruction related symptoms however with neurologic problems and sedation it is not possible to assess pain, nausea, etc. Plan for ERCP with stent repositioning, replacement or additional stent placement on Thurs or Fri pending Endo unit availability. I spoke with her daughter at the bedside in detail regarding above.   Focal status epilepticus and encephalopathy. Pt intubated and sedated.   Lucio Edward, MD [facility] See AMION, [facility], for our on call provider           HPI:   SHALAYAH BEAGLEY is a 81 y.o. female with past medical history significant for hypertension, hyperlipidemia, previous 20-year smoking history admitted for obstructive jaundice and weight loss 10/6.  MRI showed 3.4  pancreatic head mass with diffuse biliary ductal dilatation CBD was 15 mm. 03/05/2022 ERCP with Dr. Benson Norway who is covering for [facility] service for a malignant stricture of common bile duct.  Upper third main bile duct and middle third dilated with a mass  causing obstruction, biliary sphincterotomy performed, covered metal stent placed, cells for cytology obtained lower third of main duct.  Cytology showed malignant cells consistent with adenocarcinoma. Patient developed postprocedural pancreatitis with delirium 10/13.  MRI brain and CT brain negative.  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  Neurology on board.  Currently on Keppra and Vimpat. Consulting IR for LP as unable to obtain.  Gastroenterology reconsulted due to increasing alkaline phosphatase.  Patient had admitting alk phos at 739, after procedure decreased down to 175 on 03/13/2022, increased to 706 and this morning 874. Patient has increased correlated AST and ALT AST 1 day ago 22, this morning 194, most recheck at 9 43-25. ALT 46 1 day ago 138 this morning, most recent check 179. Total bilirubin was 0.8 a day ago, currently went from 1.9-2.5 most recent check. Patient also had increase in white blood cell count from 9.8-12.6, has microcytic anemia with hemoglobin 8.9 close to baseline.  Platelets are normal to 76. INR checked yesterday at 1.1 will add 1 on for today. Has not had any further abdominal imaging.  Patient lying in bed, she is able to tell me the month, states it is 2022, unable to tell me why she is here or where she is at. She does state she does not have any abdominal pain, no nausea or vomiting. She does continue to have seizures but it does not correlate with EEG so EEG was removed this morning.  Patient states bowel movement was yesterday but per nurse was 10/15 and soft. Denies any hematochezia or melena. Husband gives the majority of the history had a lot of questions as was going on but states that if his wife needs procedures he consents.  Started 750 IV Keppra every 12 hours since Sunday 15th, added on Dilantin this morning, had one-time dose of fosphenytoin this morning.   Past Medical History:  Diagnosis Date   Cancer [facility])     skin cancer   Hyperlipidemia    Hypertension     Surgical History:  She  has a past surgical history that includes Colonoscopy; Tonsillectomy; Abdominal hysterectomy; Hemorroidectomy; Colonoscopy with propofol (N/A, 11/03/2016); ERCP (N/A, 03/05/2022); biliary stent placement (03/05/2022); Biliary brushing (03/05/2022); and Colonoscopy w/ polypectomy (02/2000). Family History:  Her family history is not on file. Social History:   reports that she has quit smoking. She has never used smokeless tobacco. She reports that she does not drink alcohol and does not use drugs.  Prior to Admission medications   Medication Sig Start Date End Date Taking? Authorizing Provider  SYMBICORT 160-4.5 MCG/ACT inhaler Inhale 2 puffs into the lungs as needed (for wheezing or shortness of breath). 02/05/16  Yes [provider]  aspirin EC 81 MG tablet Take 81 mg by mouth daily.    [provider]  ezetimibe (ZETIA) 10 MG tablet Take 10 mg by mouth daily. 12/12/21   [provider]  fluvastatin XL (LESCOL XL) 80 MG 24 hr tablet Take 80 mg by mouth daily. 02/19/16   [provider]  valsartan-hydrochlorothiazide (DIOVAN-HCT) 320-25 MG tablet Take 1 tablet by mouth daily. 01/28/16   [provider]    Current Facility-Administered Medications  Medication Dose Route Frequency Provider  Last Rate Last Admin   acetaminophen (TYLENOL) tablet 650 mg  650 mg Oral Q6H PRN Howerter, Justin B, DO   650 mg at 03/14/22 0558   arformoterol (BROVANA) nebulizer solution 15 mcg  15 mcg Nebulization BID Mick Sell, PA-C   15 mcg at 03/14/22 0813   budesonide (PULMICORT) nebulizer solution 0.25 mg  0.25 mg Nebulization BID Mick Sell, PA-C   0.25 mg at 03/14/22 9983   Chlorhexidine Gluconate Cloth 2 % PADS 6 each  6 each Topical Daily Rigoberto Noel, MD   6 each at 03/14/22 0544   dextrose 5 % in lactated ringers infusion   Intravenous Continuous Jacky Kindle, MD 100 mL/hr at  03/14/22 0800 Infusion Verify at 03/14/22 0800   enoxaparin (LOVENOX) injection 40 mg  40 mg Subcutaneous Q24H Jacky Kindle, MD       fosPHENYtoin (CEREBYX) 1,316 mg PE in sodium chloride 0.9 % 50 mL IVPB  20 mg PE/kg Intravenous Once Greta Doom, MD       hydrALAZINE (APRESOLINE) injection 10-40 mg  10-40 mg Intravenous Q4H PRN Andres Labrum D, PA-C   20 mg at 03/14/22 0532   levETIRAcetam (KEPPRA) 750 mg in sodium chloride 0.9 % 100 mL IVPB  750 mg Intravenous Q12H Bhagat, Srishti L, MD 430 mL/hr at 03/14/22 0932 750 mg at 03/14/22 0932   LORazepam (ATIVAN) injection 2 mg  2 mg Intravenous Q4H PRN Donnetta Simpers, MD   2 mg at 03/11/22 2357   ondansetron (ZOFRAN) tablet 4 mg  4 mg Oral Q6H PRN Opyd, Ilene Qua, MD       Or   ondansetron (ZOFRAN) injection 4 mg  4 mg Intravenous Q6H PRN Opyd, Ilene Qua, MD       Oral care mouth rinse  15 mL Mouth Rinse 4 times per day Rigoberto Noel, MD   15 mL at 03/14/22 3825   Oral care mouth rinse  15 mL Mouth Rinse PRN Rigoberto Noel, MD       phenytoin (DILANTIN) 100 mg in sodium chloride 0.9 % 100 mL IVPB  100 mg Intravenous Q8H Chand, Currie Paris, MD       potassium chloride (KLOR-CON) packet 40 mEq  40 mEq Oral BID Leigh Aurora, DO   40 mEq at 03/14/22 0539   sodium chloride flush (NS) 0.9 % injection 10-40 mL  10-40 mL Intracatheter Q12H Candee Furbish, MD   10 mL at 03/14/22 0932   sodium chloride flush (NS) 0.9 % injection 10-40 mL  10-40 mL Intracatheter PRN Candee Furbish, MD        Allergies as of 03/03/2022   (No Known Allergies)    Review of Systems:    Constitutional: No weight loss, fever, chills, weakness or fatigue HEENT: Eyes: No change in vision               Ears, Nose, Throat:  No change in hearing or congestion Skin: No rash or itching Cardiovascular: No chest pain, chest pressure or palpitations   Respiratory: No SOB or cough Gastrointestinal: See HPI and otherwise negative Genitourinary: No dysuria or change in  urinary frequency Neurological: No headache, dizziness or syncope Musculoskeletal: No new muscle or joint pain Hematologic: No bleeding or bruising Psychiatric: No history of depression or anxiety     Physical Exam:  Vital signs in last 24 hours: Temp:  [97.5 F (36.4 C)-98.4 F (36.9 C)] 98.1 F (36.7 C) (10/17 0733) Pulse Rate:  [71-121]  85 (10/17 1000) Resp:  [10-30] 15 (10/17 1000) BP: (109-181)/(54-162) 117/90 (10/17 1000) SpO2:  [92 %-97 %] 95 % (10/17 1000) Last BM Date : 03/12/22 Last BM recorded by nurses in past 5 days Stool Type: Type 6 (Mushy consistency with ragged edges) (03/12/2022  7:33 AM)  General:   Pleasant, obese, chronically ill-appearing female in no acute distress Head:  Normocephalic and atraumatic. Eyes: sclerae anicteric,conjunctive pink  Heart:  regular rate and rhythm Pulm: Clear anteriorly; on 2 L [place] Abdomen:  Soft, Obese AB, Sluggish bowel sounds. No tenderness , No organomegaly appreciated. Extremities:  With mild edema. Msk:  Symmetrical without gross deformities. Peripheral pulses intact.  Neurologic:  Alert and  oriented x1-2, able to tell me the month of October, states it is 2022, nurse told me she said it was 1960s previously, unable to tell me where she is at or why she is here;  No focal deficits. Mild clonus on exam left greater than right.  Skin:   Dry and intact without significant lesions or rashes. Psychiatric:  Cooperative. Normal mood and affect.  LAB RESULTS: Recent Labs    03/12/22 1837 03/13/22 0411 03/14/22 0445  WBC 10.5 9.8 12.6*  HGB 9.9* 8.7* 8.9*  HCT 30.3* 26.8* 26.4*  PLT 327 286 276   BMET Recent Labs    03/13/22 0411 03/14/22 0445 03/14/22 0943  NA 139 135 136  K 2.9* 3.5 4.5  CL 103 107 102  CO2 _0 GLUCOSE 89 332* 146*  BUN _1 CREATININE 1.47* 1.11* 1.31*  CALCIUM 8.2* 7.6* 8.5*   LFT Recent Labs    03/14/22 0943  PROT 5.6*  ALBUMIN 2.0*  AST 225*  ALT 179*  ALKPHOS 874*   BILITOT 2.5*   PT/INR Recent Labs    03/12/22 1837 03/13/22 1543  LABPROT 14.4 14.5  INR 1.1 1.1    STUDIES: DG FL GUIDED LUMBAR PUNCTURE  Result Date: 03/13/2022 CLINICAL DATA:  Acute encephalopathy with focal seizures. Radiology was consulted to perform diagnostic lumbar puncture under fluoroscopic guidance EXAM: DIAGNOSTIC LUMBAR PUNCTURE UNDER FLUOROSCOPIC GUIDANCE COMPARISON:  Head CT 03/10/2022 and abdominal CT 03/03/2022 were reviewed. FLUOROSCOPY: Radiation Exposure Index (as provided by the fluoroscopic device): 14.40 mGy Kerma PROCEDURE: Informed consent was obtained from the patient prior to the procedure, including potential complications of headache, allergy, and pain. With the patient prone, the lower back was prepped with Betadine. 1% Lidocaine was used for local anesthesia. Lumbar puncture was performed at the L5-S1 level using a 20 gauge needle with return of clear CSF with an opening pressure of 36 cm water. 15 ml of CSF were obtained for laboratory studies. Closing pressure of 18 cm water. The patient tolerated the procedure well and there were no apparent complications. Procedure performed by Reatha Armour, PA under the supervision of Richardean Sale, MD. IMPRESSION: 1.  Technically successful lumbar puncture at L5-S1 level 2.  Opening pressure 36 cm water, closing pressure 18 cm water 3.  15 mL of clear CSF obtained for laboratory study 4.  Patient tolerated procedure well with no apparent complications Read by: Reatha Armour, PA-C Electronically Signed   By: Richardean Sale M.D.   On: 03/13/2022 15:17   Overnight EEG with video  Result Date: 03/13/2022 Lora Havens, MD     03/14/2022  9:20 AM Patient Name: YASMIN BRONAUGH MRN: [id number] Epilepsy Attending: Lora Havens Referring Physician/Provider: Katy Apo, NP Duration: 03/12/2022 1721 to 03/13/2022  1721  Patient history: 81yo F with right leg tremoring after the MRI and they also noted that she was  talking gibberish all afternoon for 4 to 5 hours. At approximately 1830, she was noted to have right arm and leg jerking with left gaze deviation. EEG to evaluate for seizure  Level of alertness: Awake, asleep  AEDs during EEG study: LEV  Technical aspects: This EEG study was done with scalp electrodes positioned according to the 10-20 International system of electrode placement. Electrical activity was reviewed with band pass filter of 1-_0 , sensitivity of 7 uV/mm, display speed of 57m/sec with a _1  notched filter applied as appropriate. EEG data were recorded continuously and digitally stored.  Video monitoring was available and reviewed as appropriate.  Description: No clear posterior dominant rhythm was seen. Sleep was characterized by vertex waves, sleep spindles (12 to 14 Hz), maximal frontocentral region. EEG showed continuous generalized and lateralized left hemisphere 3 to 6 Hz theta-delta slowing with overriding 12-_2  generalized beta activity. Hyperventilation and photic stimulation were not performed.   Event button was pressed on 03/13/2022 at 0958 for right lower extremity rhythmic twitching.  Concomitant EEG before, during and after the event did not show any EEG change.  ABNORMALITY - Continuous slow, generalized and lateralized left hemisphere  IMPRESSION: This study is suggestive of cortical dysfunction arising from left hemisphere likely secondary to underlying structural abnormality, post-ictal state. Additionally there is moderate to severe diffuse encephalopathy, nonspecific etiology. No epileptiform discharges were seen throughout the recording. Event button was pressed on 03/13/2022 at 0958 for rhythmic right lower extremity twitching without concomitant EEG change.  However, focal motor seizures may not be seen on scalp EEG.  Therefore clinical correlation is recommended.  Priyanka O Yadav   [place] SITE RITE  Result Date: 03/12/2022 If Site Rite image not attached, placement  could not be confirmed due to current cardiac rhythm.    AVladimir Crofts 03/14/2022, 11:20 AM

## 2022-03-14 NOTE — Progress Notes (Signed)
Speech Language Pathology Treatment: Dysphagia  Patient Details Name: Shirley Nichols MRN: [id number] DOB: [birth year] Today's Date: 03/14/2022 Time: 5366-4403 SLP Time Calculation (min) (ACUTE ONLY): 23 min  Assessment / Plan / Recommendation Clinical Impression  Pt was sleepy but still woke up to take several sips of water. She politely declined solids as she had just recently eaten, but RN and husband both reported what appeared to be adequate swallowing during breakfast. The only concern her husband raises is that the chicken last night was very dry, and therefore too hard for her to chew. Discussed options for slightly softer diet options, but landed on leaving her on regular solids and thin liquids. Menu was shared with him so that he can make requests for foods that he thinks she would be able to eat well, and education was reinforced about precautions to take. SLP will continue to follow at least briefly.   HPI HPI: 81 yo female presents [facility] on 10/[facility] with elevated liver enzymes, emesis. Pt recently diagnosed with UTI treated with nitrofurantoin. Ultrasound showing dilated gallbladder and CBD concerning for biliary ductal obstruction. CT of the abdomen and pelvis without IV contrast shows stranding around the pancreatic head concerning for possible pancreatic mass. s/p ERCP with stent placement on 10/8. Encephalopathic starting 10/10, suspected aspiration PNA in R lung. MRI pending. PMH includes HTN, HLD.      SLP Plan  Continue with current plan of care      Recommendations for follow up therapy are one component of a multi-disciplinary discharge planning process, led by the attending physician.  Recommendations may be updated based on patient status, additional functional criteria and insurance authorization.    Recommendations  Diet recommendations: Regular;Thin liquid Liquids provided via: Cup;Straw Medication Administration: Whole meds with liquid Supervision: Patient able to  self feed;Full supervision/cueing for compensatory strategies Compensations: Slow rate;Small sips/bites;Minimize environmental distractions Postural Changes and/or Swallow Maneuvers: Seated upright 90 degrees;Upright 30-60 min after meal                Oral Care Recommendations: Oral care BID Follow Up Recommendations: No SLP follow up (unless needed for cognition) Assistance recommended at discharge: Frequent or constant Supervision/Assistance SLP Visit Diagnosis: Dysphagia, unspecified (R13.10) Plan: Continue with current plan of care           Osie Bond., M.A. [place] Office [phone]  Secure chat preferred   03/14/2022, 11:48 AM

## 2022-03-14 NOTE — IPAL (Signed)
Interdisciplinary Goals of Care Family Meeting   Date carried out:: 03/14/2022  Location of the meeting: Conference room  Member's involved: Physician, Bedside Registered Nurse, and Family Member or next of kin  Durable Power of Attorney or acting medical decision maker: Georgetta Haber    Discussion: We discussed goals of care for [employer] .    The Clinical status was relayed to patient's family in the conference room including patient's husband, 2 daughters and son.   Updated and notified of patients medical condition.     Patient remains encephalopathic, agitated. She continued to have seizures and now having a weak cough and struggling to remove secretions.    Especially with her new diagnosis of adenocarcinoma of pancreas which has bad prognosis, and now with obstructive jaundice she has been having nausea and vomiting and continuous high risk of aspiration   We discussed the treatment plan, I explained that she needs to be intubated and placed on mechanical ventilator, but considering she is weak with a new diagnosis of cancer and continuous seizure and being hospitalized she is weak so this could remain that she may not come out of the ventilator.  Patient's family understood and decided to proceed with endotracheal intubation while keeping her DO NOT RESUSCITATE, while we continue to work-up and try to stop her seizure, pneumonia and treat obstructive jaundice.  Code status: Patient remains DNR but mechanical ventilation is okay for short-term  Disposition: Continue current acute care    Family are satisfied with Plan of action and management. All questions answered   Jacky Kindle MD [facility] See Amion for pager If no response to pager, please call [phone] until 7pm After 7pm, Please call E-link [phone]

## 2022-03-14 NOTE — Progress Notes (Signed)
NAME:  Shirley Nichols, MRN:  [id number], DOB:  1941-01-17, LOS: 90 ADMISSION DATE:  03/03/2022, CONSULTATION DATE:  03/12/2022 REFERRING MD:  Meredith Mody, CHIEF COMPLAINT:  Seizure   History of Present Illness:  81 year old-woman admitted to [facility] 10/6 for elevated liver enzymes and AKI with ultrasound showing CBD obstruction and pancreatic mass confirmed on MRCP.  She was transferred to [facility] and underwent ERCP stent placement on 10/8 with biopsy confirming adenocarcinoma.  She had postprocedure pancreatitis and developed increasing confusion on 10/10 with delirium on 10/13, MRI was negative.  She had witnessed focal right-sided seizure with left gaze preference, head CT was negative for CVA.  She was given 8 mg of Ativan in divided doses, loaded with Keppra and transferred to the ICU for LTM EEG  Pertinent  Medical History  HTN, HLD, Tobacco use   Significant Hospital Events: Including procedures, antibiotic start and stop dates in addition to other pertinent events   10/6-transferred to [facility] 10/7 and 10/8-GI Dr. Benson Norway consulted ERCP stent placement 10/9-post ERCP pancreatitis with AKI, pain and discomfort placed on pain meds 10/10 pancreatitis resolved but increasing confusion 10/12 Seen by Optho--unlikely ocular issue realted to visual changes 10/13 Delirious--MRI brain no lesion of casue for visual issues. PCCM consulted, transferred to ICU for cEEG and further monitoring 10/14  more awake and responsive  10/15 more focal seizures, reconsult  Interim History / Subjective:  Yesterday patient had LP attempted by [facility] team which was not successful. IR was consulted who were able to successfully perform the lumbar puncture.  Patient is morning is still drowsy and not really awake.  Patient can awake to verbal stimuli.  Patient denies any shortness of breath or chest pain.  Patient has no concerns today.  Objective   Blood pressure (!) 177/134, pulse (!) 121, temperature 98.2  F (36.8 C), temperature source Oral, resp. rate (!) 30, SpO2 92 %.        Intake/Output Summary (Last 24 hours) at 03/14/2022 0645 Last data filed at 03/14/2022 0600 Gross per 24 hour  Intake 3061.13 ml  Output 2325 ml  Net 736.13 ml    There were no vitals filed for this visit.  Examination:  General: Patient is resting in bed with nasal cannula in place Eyes: Patient able to track past midline today.  Pupils equal and reactive to light Head: Normocephalic, atraumatic  Neck: Normal range of motion Cardio: Regular rate and rhythm, no murmurs, rubs or gallops. 2 + pulse noted to bilateral upper and lower extremities  Chest: No chest tenderness Pulmonary: Clear to ausculation bilaterally with no rales, rhonchi, and crackles  Abdomen: Soft, nontender with normoactive bowel sounds  Neuro: Alert and orientated x 3.  Can follow instructions better today.  Resolved Hospital Problem list   Post ERCP pancreatitis   Assessment & Plan:  This is a 81 year old female with newly diagnosed pancreatic cancer who presented back to PCCM with seizure like activity again. Patient admitted to ICU for further treatment and work up of seizure like activity.   #Acute Encephalopathy/ Focal seizure like activity  Patient's mental status this morning is slightly improved from yesterday.  Patient is able to follow commands better this morning.  Patient is able to track past midline this morning.  No focal neurological seizures upon my exam.  Patient is alert and oriented x3.  Per neurology, patient is continuously having slight, subtle focal neurological seizures.  LP negative for any infection.  Structural etiology unlikely.  Toxic etiology unlikely.  This is likely due to her acute critical illness secondary to pancreatitis as well as newly diagnosed pancreatic cancer.  Decreased magnesium can also play a role.  Cytology pending.  -LP unrevealing at this point  -Neurology following, appreciate  recs -Continue Keppra 761m BID -Neurology to change to Dilantin from Vimpat -Frequent neuro checks   #Aspiration pneumonia  #Suspected COPD #Leukocytosis likely reactive No acute concerns for shortness of breath today. Patient has spiked a white count overnight.  Elevated white count at 12.9 today likely secondary to procedure yesterday. -Continue Brovana and Pulmicort  -Wean back down to room air with Oxygen goal >92%  #Pancreatic adenocarcinoma  #Post ERCP pancreatitis, resolved  Liver enzymes are increasing today. AST 194 and ALT 138.  Alk phos in the 700s and T. bili at 1.9.  This could be due to the fact that the stent is becoming occluded. Will closely monitor  -CTM -Outpatient follow up  -Palliative consult today will evaluate patient today -GI consult will evaluate patient today -Surgery consult who states that they will see the patient outpatient. -Oncology following who states that neoadjuvant Chemotherapy would be beneficial  #Hypokalemia,resolved   #Hypomagnesemia,resolved  K this AM 3.5 and Mag 2.0. Not at goal. Will replete.  -Replete and recheck  -Mag goal >2.0 -K goal >4.0  #Pre renal AKI  Unsure of baseline. Patient came in with elevate creatinine at 2.81. Crt is down to 1.11 during admission. GFR 35. Improving with fluids. -Continue to monitor  -Give maintenance fluids   Best Practice (right click and "Reselect all SmartList Selections" daily)   Diet/type: Clear liquids  DVT prophylaxis: Lovenox  GI prophylaxis: N/A Lines: N/A Foley:  Yes, and it is still needed Code Status:  full code  Labs   CBC: Recent Labs  Lab 03/10/22 0407 03/11/22 0647 03/12/22 1837 03/13/22 0411 03/14/22 0445  WBC 10.9* 7.8 10.5 9.8 12.6*  NEUTROABS  --   --  8.2*  --   --   HGB 9.5* 8.7* 9.9* 8.7* 8.9*  HCT 27.7* 26.3* 30.3* 26.8* 26.4*  MCV 78.5* 80.4 80.2 80.2 79.8*  PLT 260 246 327 286 276     Basic Metabolic Panel: Recent Labs  Lab 03/10/22 0407  03/10/22 2257 03/11/22 0647 03/12/22 0034 03/12/22 1837 03/13/22 0411 03/14/22 0445  NA 140  --  135  --  135 139 135  K 4.6  --  3.8  --  3.5 2.9* 3.5  CL 110  --  108  --  104 103 107  CO2 21*  --  21*  --  22 24 22   GLUCOSE 100*  --  107*  --  111* 89 332*  BUN 30*  --  28*  --  19 16 11   CREATININE 1.36*  --  1.29*  --  1.27* 1.47* 1.11*  CALCIUM 7.8*  --  7.5*  --  8.2* 8.2* 7.6*  MG 1.5*   < > 1.9 1.5* 1.8 1.6* 2.0  PHOS  --   --   --   --   --  3.7 3.3   < > = values in this interval not displayed.    GFR: Estimated Creatinine Clearance: 36.5 mL/min (A) (by C-G formula based on SCr of 1.11 mg/dL (H)). Recent Labs  Lab 03/08/22 0932 03/08/22 1232 03/09/22 0405 03/11/22 0647 03/12/22 1837 03/13/22 0411 03/14/22 0445  WBC  --   --    < > 7.8 10.5 9.8 12.6*  LATICACIDVEN  0.8 0.6  --   --   --   --   --    < > = values in this interval not displayed.     Liver Function Tests: Recent Labs  Lab 03/10/22 0407 03/11/22 0647 03/12/22 1837 03/13/22 0411 03/14/22 0445  AST 57* 32 28 22 194*  ALT 95* 70* 53* 46* 138*  ALKPHOS 289* 224* 199* 175* 706*  BILITOT 1.2 0.8 0.7 0.8 1.9*  PROT 5.1* 4.7* 5.4* 4.7* 4.5*  ALBUMIN 1.9* 1.8* 1.9* 1.7* 1.7*    Recent Labs  Lab 03/08/22 0319  LIPASE 35    Recent Labs  Lab 03/08/22 0704  AMMONIA 33     ABG    Component Value Date/Time   HCO3 25.3 03/08/2022 0704   O2SAT 92 03/08/2022 0704     Coagulation Profile: Recent Labs  Lab 03/08/22 0704 03/12/22 1837 03/13/22 1543  INR 1.2 1.1 1.1     Cardiac Enzymes: Recent Labs  Lab 03/13/22 0411  CKTOTAL 24*    HbA1C: No results found for: "HGBA1C"  CBG: Recent Labs  Lab 03/13/22 0742 03/13/22 1613 03/13/22 1935 03/13/22 2351 03/14/22 0344  GLUCAP 92 88 141* 110* 118*     Review of Systems:   Negative except for what is state in HPI   Past Medical History:  She,  has a past medical history of Cancer ([place]), Hyperlipidemia, and  Hypertension.   Surgical History:   Past Surgical History:  Procedure Laterality Date   ABDOMINAL HYSTERECTOMY     BILIARY BRUSHING  03/05/2022   Procedure: BILIARY BRUSHING;  Surgeon: Carol Ada, MD;  Location: [facility] ENDOSCOPY;  Service: Gastroenterology;;   BILIARY STENT PLACEMENT  03/05/2022   Procedure: BILIARY STENT PLACEMENT;  Surgeon: Carol Ada, MD;  Location: [facility] ENDOSCOPY;  Service: Gastroenterology;;   COLONOSCOPY     02/21/2002 adenomatous polyp, 03/23/2011   COLONOSCOPY W/ POLYPECTOMY  02/2000   TA polyp removed from ascending colon.  performed through [facility] (? affiliate in [place])   COLONOSCOPY WITH PROPOFOL N/A 11/03/2016   Procedure: COLONOSCOPY WITH PROPOFOL;  Surgeon: Manya Silvas, MD;  Location: [facility] ENDOSCOPY;  Service: Endoscopy;  Laterality: N/A;   ERCP N/A 03/05/2022   Procedure: ENDOSCOPIC RETROGRADE CHOLANGIOPANCREATOGRAPHY (ERCP);  Surgeon: Carol Ada, MD;  Location: [place];  Service: Gastroenterology;  Laterality: N/A;   HEMORROIDECTOMY     TONSILLECTOMY       Social History:   reports that she has quit smoking. She has never used smokeless tobacco. She reports that she does not drink alcohol and does not use drugs.   Family History:  Her family history is negative for Breast cancer.   Allergies No Known Allergies   Home Medications  Prior to Admission medications   Medication Sig Start Date End Date Taking? Authorizing Provider  SYMBICORT 160-4.5 MCG/ACT inhaler Inhale 2 puffs into the lungs as needed (for wheezing or shortness of breath). 02/05/16  Yes [provider]  aspirin EC 81 MG tablet Take 81 mg by mouth daily.    [provider]  ezetimibe (ZETIA) 10 MG tablet Take 10 mg by mouth daily. 12/12/21   [provider]  fluvastatin XL (LESCOL XL) 80 MG 24 hr tablet Take 80 mg by mouth daily. 02/19/16   [provider]  valsartan-hydrochlorothiazide (DIOVAN-HCT) 320-25 MG tablet Take 1 tablet by  mouth daily. 01/28/16   [provider]     Critical care time: Dawson, DO Internal Medicine  Resident PGY-1 Pager: [phone]

## 2022-03-14 NOTE — Progress Notes (Addendum)
Patient admitted with obstructive jaundice and acute renal failure. S/p ERCP stent and brushings that were diagnostic for adenocarcinoma. She developed post-ERCP pancreatitis and is having ongoing focal seizures. General surgery ask to weigh in on surgical options for pancreatic cancer. Given age, malnutrition, acute illness, ongoing seizures, she is currently too frail for a major operation. She needs oncology follow up and I will arrange outpatient follow up with one of our hepatobiliary surgeons.   No immediate surgical needs. Please call as needed and we will be happy to see the patient.   Obie Dredge, PA-C [facility] Please see Amion for pager number during day hours 7:00am-4:30pm

## 2022-03-14 NOTE — Progress Notes (Signed)
OT Cancellation Note  Patient Details Name: Shirley Nichols MRN: [id number] DOB: 1940-06-16   Cancelled Treatment:    Reason Eval/Treat Not Completed: Medical issues which prohibited therapy- pt with seizure like activity, RN in room assisting patient. Will follow as see as able.   Jolaine Artist, OT Acute Rehabilitation Services Office [phone]   Shirley Nichols 03/14/2022, 12:55 PM

## 2022-03-14 NOTE — Progress Notes (Signed)
Fixed Fp1, Fz C3 F4 F8 and Fz.  All under 10

## 2022-03-14 NOTE — Procedures (Signed)
Bronchoscopy Procedure Note  SAYGE BRIENZA  [id number]  09/26/1940  Date:03/14/22  Time:4:16 PM   Provider Performing:Nataliee Shurtz   Procedure(s):  Flexible bronchoscopy with bronchial alveolar lavage (40814)  Indication(s) Acute respiratory failure with aspiration PNA  Consent Risks of the procedure as well as the alternatives and risks of each were explained to the patient and/or caregiver.  Consent for the procedure was obtained and is signed in the bedside chart  Anesthesia Etomidate and Rocuronium   Time Out Verified patient identification, verified procedure, site/side was marked, verified correct patient position, special equipment/implants available, medications/allergies/relevant history reviewed, required imaging and test results available.   Sterile Technique Usual hand hygiene, masks, gowns, and gloves were used   Procedure Description Bronchoscope advanced through endotracheal tube and into airway.  Airways were examined down to subsegmental level with findings noted below.   Following diagnostic evaluation, BAL(s) performed in RLL/LLL with normal saline and return of Clear fluid  Findings: Copious amounts thin secretions noted all over the respiratory tree   Complications/Tolerance None; patient tolerated the procedure well. Chest X-ray is needed post procedure.   EBL Minimal   Specimen(s) BAL

## 2022-03-15 ENCOUNTER — Inpatient Hospital Stay (HOSPITAL_COMMUNITY): Payer: PPO

## 2022-03-15 DIAGNOSIS — R7989 Other specified abnormal findings of blood chemistry: Secondary | ICD-10-CM | POA: Diagnosis not present

## 2022-03-15 DIAGNOSIS — E440 Moderate protein-calorie malnutrition: Secondary | ICD-10-CM | POA: Insufficient documentation

## 2022-03-15 DIAGNOSIS — C801 Malignant (primary) neoplasm, unspecified: Secondary | ICD-10-CM | POA: Diagnosis not present

## 2022-03-15 DIAGNOSIS — N179 Acute kidney failure, unspecified: Secondary | ICD-10-CM | POA: Diagnosis not present

## 2022-03-15 DIAGNOSIS — G9340 Encephalopathy, unspecified: Secondary | ICD-10-CM | POA: Diagnosis not present

## 2022-03-15 DIAGNOSIS — C250 Malignant neoplasm of head of pancreas: Secondary | ICD-10-CM | POA: Diagnosis not present

## 2022-03-15 DIAGNOSIS — J9601 Acute respiratory failure with hypoxia: Secondary | ICD-10-CM | POA: Diagnosis not present

## 2022-03-15 DIAGNOSIS — K831 Obstruction of bile duct: Secondary | ICD-10-CM | POA: Diagnosis not present

## 2022-03-15 LAB — PHENYTOIN LEVEL, TOTAL: Phenytoin Lvl: 16.4 ug/mL (ref 10.0–20.0)

## 2022-03-15 LAB — GLUCOSE, CAPILLARY
Glucose-Capillary: 101 mg/dL — ABNORMAL HIGH (ref 70–99)
Glucose-Capillary: 106 mg/dL — ABNORMAL HIGH (ref 70–99)
Glucose-Capillary: 109 mg/dL — ABNORMAL HIGH (ref 70–99)
Glucose-Capillary: 115 mg/dL — ABNORMAL HIGH (ref 70–99)
Glucose-Capillary: 150 mg/dL — ABNORMAL HIGH (ref 70–99)
Glucose-Capillary: 69 mg/dL — ABNORMAL LOW (ref 70–99)
Glucose-Capillary: 76 mg/dL (ref 70–99)
Glucose-Capillary: 86 mg/dL (ref 70–99)

## 2022-03-15 LAB — CBC
HCT: 28.6 % — ABNORMAL LOW (ref 36.0–46.0)
Hemoglobin: 9.8 g/dL — ABNORMAL LOW (ref 12.0–15.0)
MCH: 27.3 pg (ref 26.0–34.0)
MCHC: 34.3 g/dL (ref 30.0–36.0)
MCV: 79.7 fL — ABNORMAL LOW (ref 80.0–100.0)
Platelets: 336 10*3/uL (ref 150–400)
RBC: 3.59 MIL/uL — ABNORMAL LOW (ref 3.87–5.11)
RDW: 15.5 % (ref 11.5–15.5)
WBC: 12 10*3/uL — ABNORMAL HIGH (ref 4.0–10.5)
nRBC: 0 % (ref 0.0–0.2)

## 2022-03-15 LAB — COMPREHENSIVE METABOLIC PANEL
ALT: 174 U/L — ABNORMAL HIGH (ref 0–44)
AST: 146 U/L — ABNORMAL HIGH (ref 15–41)
Albumin: 2 g/dL — ABNORMAL LOW (ref 3.5–5.0)
Alkaline Phosphatase: 775 U/L — ABNORMAL HIGH (ref 38–126)
Anion gap: 10 (ref 5–15)
BUN: 12 mg/dL (ref 8–23)
CO2: 25 mmol/L (ref 22–32)
Calcium: 8.2 mg/dL — ABNORMAL LOW (ref 8.9–10.3)
Chloride: 100 mmol/L (ref 98–111)
Creatinine, Ser: 1.19 mg/dL — ABNORMAL HIGH (ref 0.44–1.00)
GFR, Estimated: 46 mL/min — ABNORMAL LOW (ref >60)
Glucose, Bld: 144 mg/dL — ABNORMAL HIGH (ref 70–99)
Potassium: 4.1 mmol/L (ref 3.5–5.1)
Sodium: 135 mmol/L (ref 135–145)
Total Bilirubin: 2.5 mg/dL — ABNORMAL HIGH (ref 0.3–1.2)
Total Protein: 5.4 g/dL — ABNORMAL LOW (ref 6.5–8.1)

## 2022-03-15 LAB — TRIGLYCERIDES: Triglycerides: 295 mg/dL — ABNORMAL HIGH (ref <150)

## 2022-03-15 LAB — VDRL, CSF: VDRL Quant, CSF: NONREACTIVE

## 2022-03-15 LAB — PHOSPHORUS
Phosphorus: 4.6 mg/dL (ref 2.5–4.6)
Phosphorus: 3.5 mg/dL (ref 2.5–4.6)

## 2022-03-15 LAB — HEMOGLOBIN A1C
Hgb A1c MFr Bld: 5.9 % — ABNORMAL HIGH (ref 4.8–5.6)
Mean Plasma Glucose: 122.63 mg/dL

## 2022-03-15 LAB — MAGNESIUM
Magnesium: 2 mg/dL (ref 1.7–2.4)
Magnesium: 1.6 mg/dL — ABNORMAL LOW (ref 1.7–2.4)

## 2022-03-15 MED ORDER — INSULIN ASPART 100 UNIT/ML IJ SOLN
0.0000 [IU] | INTRAMUSCULAR | Status: DC
  Administered 2022-03-15: 2 [IU] via SUBCUTANEOUS
  Administered 2022-03-16 – 2022-03-18 (×4): 1 [IU] via SUBCUTANEOUS
  Administered 2022-03-19: 2 [IU] via SUBCUTANEOUS
  Administered 2022-03-19 – 2022-03-20 (×2): 1 [IU] via SUBCUTANEOUS
  Administered 2022-03-20: 2 [IU] via SUBCUTANEOUS
  Administered 2022-03-21 – 2022-03-22 (×6): 1 [IU] via SUBCUTANEOUS
  Administered 2022-03-23 (×2): 2 [IU] via SUBCUTANEOUS

## 2022-03-15 MED ORDER — FENTANYL 2500MCG IN NS 250ML (10MCG/ML) PREMIX INFUSION
0.0000 ug/h | INTRAVENOUS | Status: DC
  Administered 2022-03-15: 50 ug/h via INTRAVENOUS
  Filled 2022-03-15: qty 250

## 2022-03-15 MED ORDER — NOREPINEPHRINE 4 MG/250ML-% IV SOLN
0.0000 ug/min | INTRAVENOUS | Status: DC
  Administered 2022-03-15: 2 ug/min via INTRAVENOUS
  Filled 2022-03-15: qty 250

## 2022-03-15 MED ORDER — PEPTAMEN 1.5 CAL PO LIQD
1000.0000 mL | ORAL | Status: DC
  Administered 2022-03-15: 1000 mL
  Filled 2022-03-15 (×3): qty 1000

## 2022-03-15 MED ORDER — SENNA 8.6 MG PO TABS
1.0000 | ORAL_TABLET | Freq: Every day | ORAL | Status: DC
  Administered 2022-03-16: 8.6 mg
  Filled 2022-03-15: qty 1

## 2022-03-15 MED ORDER — CHLORHEXIDINE GLUCONATE CLOTH 2 % EX PADS
6.0000 | MEDICATED_PAD | CUTANEOUS | Status: DC
  Administered 2022-03-16 – 2022-03-23 (×7): 6 via TOPICAL

## 2022-03-15 MED ORDER — DEXTROSE 50 % IV SOLN
INTRAVENOUS | Status: AC
  Administered 2022-03-15: 25 mL
  Filled 2022-03-15: qty 50

## 2022-03-15 NOTE — Progress Notes (Signed)
[facility] Progress Note Patient Name: JOUD PETTINATO DOB: Feb 15, 1941 MRN: [id number]   Date of Service  03/15/2022  HPI/Events of Note  Hypotension - BP 95/44 with MAP = 59.  eICU Interventions  Plan: Norepinephrine IV infuion. Titrate to MAP >= 65. Monitor CVP now and Q 4 hours.      Intervention Category Major Interventions: Hypotension - evaluation and management  Charletha Dalpe Eugene 03/15/2022, 6:46 AM

## 2022-03-15 NOTE — Progress Notes (Addendum)
Progress Note  Primary GI: Dr. Havery Moros   Subjective  Chief Complaint: Elevated LFTs in setting of pancreatic head mass and obstructive jaundice status post ERCP  Patient unfortunately had recurrent event yesterday around 1400 with right-sided shaking mental status changes requiring intubation.  Still on ventilator.  Weaned off Levophed decrease sedation this time. Husband in the room, stating he is post to cc or shaking.   Objective   Vital signs in last 24 hours: Temp:  [97.1 F (36.2 C)-99.6 F (37.6 C)] 98.5 F (36.9 C) (10/18 1129) Pulse Rate:  [61-105] 93 (10/18 1138) Resp:  [7-26] 10 (10/18 1138) BP: (93-181)/(44-87) 132/55 (10/18 1000) SpO2:  [93 %-99 %] 98 % (10/18 1138) FiO2 (%):  [40 %-100 %] 40 % (10/18 1138) Last BM Date : 03/12/22 Last BM recorded by nurses in past 5 days Stool Type: Type 6 (Mushy consistency with ragged edges) (03/12/2022  7:33 AM)  General: Intubated, sedated Heart:  Regular rate and rhythm; no murmurs Pulm: Clear anteriorly; no wheezing Abdomen:  Soft, Obese AB, Sluggish bowel sounds.  With palpation patient withdrew slightly from pain possibly but difficult to to know with sedation.  No organomegaly appreciated. Extremities:  without  edema. Neurologic: Sedated, can withdraw from pain, had small episode of foot clonus left-sided blood present.  Intake/Output from previous day: 10/17 0701 - 10/18 0700 In: 2944.7 [I.V.:2277.7; NG/GT:60; IV [id number]:782] Out: 1000 [Urine:1000] Intake/Output this shift: Total I/O In: -  Out: 130 [Urine:130]  Studies/Results: DG Abd Portable 1V  Result Date: 03/15/2022 CLINICAL DATA:  OG tube placement. EXAM: PORTABLE ABDOMEN - 1 VIEW COMPARISON:  One-view chest x-ray 03/14/2022 FINDINGS: Biliary stent is in place.  Bowel gas pattern is unremarkable. NG tube is coiled in the stomach. The tip is directed cephalad in the distal esophagus. The portion directed upward in the esophagus has increased since  the previous exam. Bilateral pleural effusions and basilar airspace disease noted. IMPRESSION: 1. NG tube is coiled in the stomach. The tip is directed cephalad in the distal esophagus. 2. Biliary stent in place. Electronically Signed   By: San Morelle M.D.   On: 03/15/2022 08:47   DG CHEST PORT 1 VIEW  Result Date: 03/14/2022 CLINICAL DATA:  Endotracheal tube EXAM: PORTABLE CHEST 1 VIEW COMPARISON:  Earlier today at 2:02 p.m. FINDINGS: 4:45 p.m. Support apparatus: Endotracheal tube terminates 4.6 cm above carina. Nasogastric tube placed in the interval, looped in the stomach with tip at distal esophagus. Right PICC line tip at superior caval/atrial junction. Heart/mediastinum: Midline trachea.  Moderate cardiomegaly Pleura: Small bilateral pleural effusions are similar. No pneumothorax. Lungs: Left-greater-than-right base airspace disease is not significantly changed.Moderate interstitial edema is increased. Other: Numerous leads and wires project over the chest. IMPRESSION: Appropriate position of endotracheal tube. Nasogastric tube looped in the stomach with tip at the distal esophagus. Similar congestive heart failure with layering bilateral pleural effusions and bibasilar airspace disease. These results will be called to the ordering clinician or representative by the Radiologist Assistant, and communication documented in the PACS or [employer]. Electronically Signed   By: Abigail Miyamoto M.D.   On: 03/14/2022 17:11   DG Abd Portable 1V  Result Date: 03/14/2022 CLINICAL DATA:  Abnormal liver function tests. Evaluate biliary stent EXAM: PORTABLE ABDOMEN - 1 VIEW COMPARISON:  CT chest done on 03/11/2022 FINDINGS: Bowel gas pattern is nonspecific. No abnormal masses or calcifications are seen. Increased density in left lower lung fields may suggest pleural effusion. There is a stent  in right side of abdomen. The stent is vertically oriented, possibly positioned in the distal common bile duct  and duodenum. IMPRESSION: Nonspecific bowel gas pattern. A biliary stent is noted in right side of abdomen. The stent is more vertically oriented than usual. This may suggest presence of stent in the distal common bile duct and duodenum. Electronically Signed   By: Elmer Picker M.D.   On: 03/14/2022 15:48   DG Chest Port 1 View  Result Date: 03/14/2022 CLINICAL DATA:  Pulmonary aspiration.  Seizure. EXAM: PORTABLE CHEST 1 VIEW COMPARISON:  CT chest 03/11/2022 and chest radiograph 03/08/2022 FINDINGS: Hazy opacities at the lung bases compatible with lower lobe atelectasis or pneumonia likely superimposed on pleural effusions which were better shown on the 03/11/2022 exam. Right PICC line tip: SVC. Mild enlargement of the cardiopericardial silhouette. Indistinct pulmonary vasculature suggesting pulmonary venous hypertension. IMPRESSION: 1. Hazy opacities at the lung bases compatible with lower lobe atelectasis or pneumonia likely superimposed on pleural effusions. 2. Mild enlargement of the cardiopericardial silhouette with indistinct pulmonary vasculature suggesting pulmonary venous hypertension. Electronically Signed   By: Van Clines M.D.   On: 03/14/2022 14:31   DG FL GUIDED LUMBAR PUNCTURE  Result Date: 03/13/2022 CLINICAL DATA:  Acute encephalopathy with focal seizures. Radiology was consulted to perform diagnostic lumbar puncture under fluoroscopic guidance EXAM: DIAGNOSTIC LUMBAR PUNCTURE UNDER FLUOROSCOPIC GUIDANCE COMPARISON:  Head CT 03/10/2022 and abdominal CT 03/03/2022 were reviewed. FLUOROSCOPY: Radiation Exposure Index (as provided by the fluoroscopic device): 14.40 mGy Kerma PROCEDURE: Informed consent was obtained from the patient prior to the procedure, including potential complications of headache, allergy, and pain. With the patient prone, the lower back was prepped with Betadine. 1% Lidocaine was used for local anesthesia. Lumbar puncture was performed at the L5-S1 level  using a 20 gauge needle with return of clear CSF with an opening pressure of 36 cm water. 15 ml of CSF were obtained for laboratory studies. Closing pressure of 18 cm water. The patient tolerated the procedure well and there were no apparent complications. Procedure performed by Reatha Armour, PA under the supervision of Richardean Sale, MD. IMPRESSION: 1.  Technically successful lumbar puncture at L5-S1 level 2.  Opening pressure 36 cm water, closing pressure 18 cm water 3.  15 mL of clear CSF obtained for laboratory study 4.  Patient tolerated procedure well with no apparent complications Read by: Reatha Armour, PA-C Electronically Signed   By: Richardean Sale M.D.   On: 03/13/2022 15:17    Lab Results: Recent Labs    03/13/22 0411 03/14/22 0445 03/14/22 1715 03/15/22 0118  WBC 9.8 12.6*  --  12.0*  HGB 8.7* 8.9* 8.5* 9.8*  HCT 26.8* 26.4* 25.0* 28.6*  PLT 286 276  --  336   BMET Recent Labs    03/14/22 0445 03/14/22 0943 03/14/22 1715 03/15/22 0118  NA 135 136 134* 135  K 3.5 4.5 3.7 4.1  CL 107 102  --  100  CO2 22 24  --  25  GLUCOSE 332* 146*  --  144*  BUN 11 12  --  12  CREATININE 1.11* 1.31*  --  1.19*  CALCIUM 7.6* 8.5*  --  8.2*   LFT Recent Labs    03/14/22 1430 03/15/22 0118  PROT  --  5.4*  ALBUMIN  --  2.0*  AST  --  146*  ALT  --  174*  ALKPHOS  --  775*  BILITOT  --  2.5*  BILIDIR 1.9*  --  PT/INR Recent Labs    03/13/22 1543 03/14/22 1303  LABPROT 14.5 15.7*  INR 1.1 1.3*     Scheduled Meds:  arformoterol  15 mcg Nebulization BID   budesonide (PULMICORT) nebulizer solution  0.25 mg Nebulization BID   Chlorhexidine Gluconate Cloth  6 each Topical Daily   docusate  100 mg Per Tube BID   enoxaparin (LOVENOX) injection  40 mg Subcutaneous Q24H   insulin aspart  0-9 Units Subcutaneous Q4H   mouth rinse  15 mL Mouth Rinse Q2H   pantoprazole  40 mg Per Tube Daily   polyethylene glycol  17 g Per Tube Daily   senna  1 tablet Per Tube Daily    sodium chloride flush  10-40 mL Intracatheter Q12H   Continuous Infusions:  dextrose 5% lactated ringers 100 mL/hr at 03/15/22 0847   feeding supplement (PEPTAMEN 1.5 CAL)     fentaNYL infusion INTRAVENOUS 50 mcg/hr (03/15/22 0600)   levETIRAcetam 750 mg (03/15/22 0940)   norepinephrine (LEVOPHED) Adult infusion 2 mcg/min (03/15/22 0700)   phenytoin (DILANTIN) 100 mg in sodium chloride 0.9 % 100 mL IVPB 100 mg (03/15/22 1352)   propofol (DIPRIVAN) infusion 4.99 mcg/kg/min (03/15/22 0600)      Impression/Plan:   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 AST 146 (225) ALT 174 (179) ALK Phos 774 (874) Total bili 2.5 ( 2.5)  Concerning for possible restenosis or stent migration.  Patient is also been on several medications for seizures which could be contributing to liver function. KUB shows stent in similar position to ERCP films. Currently no evidence of cholangitis or obstruction related symptoms however with neurological issues and sedation, difficult to assess. Due to ongoing acute encephalopathy and acute respiratory failure requiring intubation, will plan for ERCP with stent repositioning, replacement for additional stent placement on Friday. Repeat INR, CMET tomorrow.   Focal status epilepticus and acute encephalopathy MRI and head CT negative Neurology following on Keppra and Vimpat LP unrevealing, pending CSF cytology Worsening mental status changes requiring intubation yesterday EEG.   Acute respiratory failure secondary to acute encephalopathy and focal epilepticus CCM following patient.  Bronchial lavage yesterday Weaning sedation.  Aspiration pneumonia  Completed antibiotics, still on 2 L nasal cannula.   AKI on CKD stage IIIb    LOS: 12 days   Shirley Nichols  03/15/2022, 2:01 PM   Attending Physician  Note   I have taken an interval history, reviewed the chart and examined the patient. I performed a substantive portion of this encounter, including complete performance of at least one of the key components, in conjunction with the APP. I agree with the APP's note, impression and recommendations with my edits. My additional impressions and recommendations are as follows.   Pancreatic adenocarcinoma with biliary obstruction S/P ERCP with covered metal stent placed. LFTs improved however they are now rising concerning for recurrent biliary obstruction - stent obstruction or migration. Stent seems to be in good position on KUB. Currently no evidence for cholangitis or obstructive symptoms however with neurologic problems and sedation it is not possible to assess pain, nausea, etc. Tentatively plan for ERCP with stent repositioning, replacement or additional stent placement when neurologic status has stabilized, improved - possibly later this week or next week.    Focal status epilepticus and encephalopathy. Pt intubated and sedated.  Lucio Edward, MD [facility] See AMION, [facility], for our on call provider

## 2022-03-15 NOTE — Progress Notes (Signed)
Initial Nutrition Assessment  DOCUMENTATION CODES:   Non-severe (moderate) malnutrition in context of chronic illness  INTERVENTION:   Initiate tube feeds via OG tube: - Start Peptamen 1.5 @ 20 ml/hr and advance by 10 ml q 6 hours to goal rate of 45 ml/hr (1080 ml/day) - PROSource TF20 60 ml daily  Tube feeding regimen at goal rate provides 1700 kcal, 93 grams of protein, and 829 ml of H2O.   Monitor magnesium, potassium, and phosphorus BID for at least 3 days, MD to replete as needed, as pt is at risk for refeeding syndrome given malnutrition, inadequate PO intake since admission.  NUTRITION DIAGNOSIS:   Moderate Malnutrition related to chronic illness (pancreatic cancer) as evidenced by mild fat depletion, moderate muscle depletion.  GOAL:   Patient will meet greater than or equal to 90% of their needs  MONITOR:   Vent status, Labs, Weight trends, TF tolerance  REASON FOR ASSESSMENT:   Ventilator, Consult Enteral/tube feeding initiation and management  ASSESSMENT:   81 year old female who presented to the ED on 10/06 with elevated LFTs. PMH of HTN, HLD, tobacco abuse. Pt admitted with pancreatic head mass with obstructive jaundice.  10/08 - NPO, s/p ERCP with sphincterotomy and biliary stent placement and brush biopsy 10/09 - pt with post-ERCP acute pancreatitis, clear liquid diet 10/10 - heart healthy diet, pancreatitis resolved but increasing confusion 10/11 - suspected aspiration, dysphagia 1 diet with thin liquids 10/12 - regular diet 10/13 - NPO, seizures, transferred to ICU 10/14 - full liquid diet 10/15 - NPO, more seizures 10/16 - regular diet, s/p LP 10/17 - intubated  Discussed pt with RN and during ICU rounds. Pt with newly diagnosed pancreatic cancer and now with recurrent focal seizures. Consult received for enteral nutrition initiation and management. Pt with OG tube in stomach per x-ray. Noted plan for ERCP either tomorrow or Friday.  Noted pt had  episode of N/V on 10/17. Will start tube feeds at trickle rate and slowly advance to goal. Pt also at refeeding risk. Discussed with RN.  Spoke with pt's husband at bedside. He reports that pt's appetite was normal until about 1 week PTA when she began feeling sick. Pt preferred to eat sweets. Pt's husband reports that pt's PO intake has been minimal this admission due to changes in mental status, being NPO, etc. When pt was on a dysphagia 1 diet, pt's husband states that he fed her. He reports last seeing pt eat yesterday morning when she had eggs and some fruit. Pt's husband reports that pt did vomit a little bit after this.  Pt's husband reports that pt's UBW is 130-133 lbs. He believes that she has lost about 4-5 lbs in the 1-2 weeks PTA.  Patient is currently intubated on ventilator support MV: 9.8 L/min Temp (24hrs), Avg:98 F (36.7 C), Min:97.1 F (36.2 C), Max:99.6 F (37.6 C)  Drips: Propofol: 1.97 ml/hr (provides 52 kcal daily from lipid) D5 in LR: 100 ml/hr Fentanyl Levophed  Medications reviewed and include: colace, SSI q 4 hours, protonix, miralax, senna, IV dilantin  Labs reviewed: creatinine 1.19, elevated LFTs, TG 295, WBC 12.0, hemoglobin 9.8 CBG's: 69-168 x 24 hours  UOP: 1000 ml x 24 hours I/O's: +14.7 L since admit  NUTRITION - FOCUSED PHYSICAL EXAM:  Flowsheet Row Most Recent Value  Orbital Region Mild depletion  Upper Arm Region No depletion  Thoracic and Lumbar Region Mild depletion  Buccal Region Unable to assess  Temple Region Mild depletion  Clavicle Bone Region Moderate  depletion  Clavicle and Acromion Bone Region Moderate depletion  Scapular Bone Region Mild depletion  Dorsal Hand Mild depletion  Patellar Region Mild depletion  Anterior Thigh Region Mild depletion  Posterior Calf Region Mild depletion  Edema (RD Assessment) Mild  Hair Reviewed  Eyes Reviewed  Mouth Reviewed  Skin Reviewed  Nails Reviewed       Diet Order:   Diet Order              Diet NPO time specified  Diet effective now                   EDUCATION NEEDS:   No education needs have been identified at this time  Skin:  Skin Assessment: Reviewed RN Assessment  Last BM:  03/12/22  Height:   Ht Readings from Last 1 Encounters:  03/03/22 '5\' 4"'$  (1.626 m)    Weight:   Wt Readings from Last 1 Encounters:  03/03/22 65.8 kg   BMI:  24.92  Estimated Nutritional Needs:   Kcal:  1600-1800  Protein:  80-95 grams  Fluid:  1.6-1.8 L    Gustavus Bryant, MS, RD, LDN Inpatient Clinical Dietitian Please see AMiON for contact information.

## 2022-03-15 NOTE — Progress Notes (Signed)
OT Cancellation Note  Patient Details Name: Shirley Nichols MRN: [id number] DOB: [birth year]   Cancelled Treatment:    Reason Eval/Treat Not Completed: Medical issues which prohibited therapy- pt intubated, sedated.  Will follow acutely and see as appropriate.   [place] Office [phone]   Delight Stare 03/15/2022, 10:50 AM

## 2022-03-15 NOTE — Progress Notes (Signed)
Subjective: She had a recurrent event yesterday with right-sided shaking and mental status changes which required intubation.  She has not had any further episodes concerning for seizure since that time.  Exam: Vitals:   03/15/22 1129 03/15/22 1138  BP:    Pulse:  93  Resp:  10  Temp: 98.5 F (36.9 C)   SpO2:  98%   Gen: In bed, NAD Resp: non-labored breathing, no acute distress Abd: soft, nt  Neuro: MS: Awake, alert, interactive and appropriate CN: Visual fields full, EOMI Motor: Good strength throughout Sensory: Intact to light touch  Pertinent Labs: Cr 1.31  Impression: 81 year old female with recurrent focal seizures.  The EEG is negative, but based on semiology I suspect a mesial frontal focus, hence leg predominance. With a deep focus, surface EEG can be negative.   Following cessation of EEG, she had a recurrent episode which involved not only shaking but also mental status changes and eye movement changes requiring intubation.  She is following commands readily, but I would favor reconnecting EEG to see if we can capture any episode moving for that is accompanied by mental status changes.  She had only recently received the phenytoin load, will check level today and make sure we are maximizing this.  I also maximize her Keppra  Recommendations: 1) continue Keppra at '1000mg'$  twice daily 2) continue dilantin '100mg'$  TID   3) resume LTM EEG 4) Will continue to follow.    Roland Rack, MD [facility] [phone]  If 7pm- 7am, please page neurology on call as listed in [place].

## 2022-03-15 NOTE — Progress Notes (Addendum)
NAME:  Shirley Nichols, MRN:  [id number], DOB:  [birth year], LOS: 38 ADMISSION DATE:  03/03/2022, CONSULTATION DATE:  03/12/2022 REFERRING MD:  Meredith Mody, CHIEF COMPLAINT:  Seizure   History of Present Illness:  81 year old-woman admitted to [facility] 10/6 for elevated liver enzymes and AKI with ultrasound showing CBD obstruction and pancreatic mass confirmed on MRCP.  She was transferred to [facility] and underwent ERCP stent placement on 10/8 with biopsy confirming adenocarcinoma.  She had postprocedure pancreatitis and developed increasing confusion on 10/10 with delirium on 10/13, MRI was negative.  She had witnessed focal right-sided seizure with left gaze preference, head CT was negative for CVA.  She was given 8 mg of Ativan in divided doses, loaded with Keppra and transferred to the ICU for LTM EEG  Pertinent  Medical History  HTN, HLD, Tobacco use   Significant Hospital Events: Including procedures, antibiotic start and stop dates in addition to other pertinent events   10/6-transferred to [facility] 10/7 and 10/8-GI Dr. Benson Norway consulted ERCP stent placement 10/9-post ERCP pancreatitis with AKI, pain and discomfort placed on pain meds 10/10 pancreatitis resolved but increasing confusion 10/12 Seen by Optho--unlikely ocular issue realted to visual changes 10/13 Delirious--MRI brain no lesion of casue for visual issues. PCCM consulted, transferred to ICU for cEEG and further monitoring 10/14  more awake and responsive  10/15 more focal seizures, reconsult  Interim History / Subjective:  Patient is intubated and sedated on my exam.  Interval history, patient was not able to protect her own airway due to mental status change yesterday around 1400.  This required intubation after lengthy family discussion led by Dr. Tacy Learn.  This morning, patient did require some Levophed, but has since been weaned off.  No other concerns at this time.  Objective   Blood pressure (!) 95/44, pulse 75,  temperature 98.1 F (36.7 C), temperature source Axillary, resp. rate 15, SpO2 99 %. CVP:  [9 mmHg] 9 mmHg  Vent Mode: PRVC FiO2 (%):  [40 %-100 %] 40 % Set Rate:  [15 bmp] 15 bmp Vt Set:  [430 mL] 430 mL PEEP:  [5 cmH20] 5 cmH20 Plateau Pressure:  [14 cmH20-23 cmH20] 14 cmH20   Intake/Output Summary (Last 24 hours) at 03/15/2022 0706 Last data filed at 03/15/2022 0600 Gross per 24 hour  Intake 2944.65 ml  Output 1000 ml  Net 1944.65 ml    There were no vitals filed for this visit.  Examination:  General: Patient is intubated and sedated. Eyes: Pupils equal and reactive to light Head: Normocephalic, atraumatic  Neck: Normal range of motion Cardio: Regular rate and rhythm, no murmur, rubs, or gallops. Chest: No chest tenderness Pulmonary: Coarse breath sounds throughout Abdomen: Soft, nontender. Neuro: Debated and sedated.  Pupils equal and reactive.  Gag reflex present.  Patient can withdraw to pain. Resolved Hospital Problem list   Post ERCP pancreatitis   Assessment & Plan:  This is a 81 year old female with newly diagnosed pancreatic cancer who presented back to PCCM with seizure like activity again. Patient admitted to ICU for further treatment and work up of seizure like activity.   #Focal status epilepticus #Acute encephalopathy Patient's mental status yesterday declined, with concern for patient being able to support her own airway.  Patient was therefore intubated.  Patient remains intubated, and sedation has been weaned.  On exam, patient is not able to follow commands.  Patient's basic neurological function is still intact with reactive pupils and gag reflex.  Patient can  withdraw to painful stimuli.  Etiology is still uncertain, but leading theory could be mesial frontal focus per neurology.  Neurology still following.  Electrolytes are still being managed.  CSF cytology pending.  -LP unrevealing at this point  -Neurology following, appreciate recs -Continue  Keppra 1062m BID -Dilantin 1068mBID -Frequent neuro checks   #Acute respiratory failure secondary to above Patient required mechanical ventilation after patient was deemed not to have mental awareness for airway protection.  Patient did have some aspiration.  Patient did have bronchial alveolar lavage yesterday.  We will continue to follow cultures.  Patient's sedation has been weaned.  Continue to monitor respiratory status -Continue Brovana and Pulmicort nebs -Continue to wean vent settings -Start tube feeds -VAP bundle in place -Continue to monitor respiratory status -EEG  #Hypotension Patient's MAPs are dropping into the low 60s to high 50s overnight.  This required 2 of Levophed.  Levophed has since been weaned.  This is likely secondary to sedation. -Monitor blood pressure -Levophed as needed to keep MAP greater than 65  #Pancreatic adenocarcinoma  #Post ERCP pancreatitis, resolved  Liver enzymes stable from yesterday with current AST 146.  ALT 174.  Alk phos 775.  T. bili 2.5.  GI following who will proceed with ERCP either on Thursday, 03/16/2022, or Friday, 03/17/2022.  This is likely secondary to either stent migration or stent occlusion.  Will closely monitor  -CTM -Outpatient follow up for pancreatic adenocarcinoma -Palliative will be patient today -GI following, appreciate recs -Surgery will see patient outpatient -Oncology following who states that neoadjuvant Chemotherapy would be beneficial  #Hypokalemia,resolved   #Hypomagnesemia,resolved  Potassium 4.1 today.  Mag 2.0 today.  -At goal -Mag goal >2.0 -K goal >4.0  #Pre renal AKI-resolved Patient's creatinine at 1.19 today.  Patient is given maintenance fluids.  We will switch out the maintenance fluid once tube feeds start -Continue to monitor   Best Practice (right click and "Reselect all SmartList Selections" daily)   Diet/type: Tube feeds DVT prophylaxis: Lovenox  GI prophylaxis: PPI Lines: right PICC  line Foley:  Yes, and it is still needed Code Status: Partial code, no chest compressions or shocks, but yes to intubation.  Labs   CBC: Recent Labs  Lab 03/11/22 0647 03/12/22 1837 03/13/22 0411 03/14/22 0445 03/14/22 1715 03/15/22 0118  WBC 7.8 10.5 9.8 12.6*  --  12.0*  NEUTROABS  --  8.2*  --   --   --   --   HGB 8.7* 9.9* 8.7* 8.9* 8.5* 9.8*  HCT 26.3* 30.3* 26.8* 26.4* 25.0* 28.6*  MCV 80.4 80.2 80.2 79.8*  --  79.7*  PLT 246 327 286 276  --  336     Basic Metabolic Panel: Recent Labs  Lab 03/12/22 0034 03/12/22 1837 03/13/22 0411 03/14/22 0445 03/14/22 0943 03/14/22 1715 03/15/22 0118  NA  --  135 139 135 136 134* 135  K  --  3.5 2.9* 3.5 4.5 3.7 4.1  CL  --  104 103 107 102  --  100  CO2  --  22 24 22 24   --  25  GLUCOSE  --  111* 89 332* 146*  --  144*  BUN  --  19 16 11 12   --  12  CREATININE  --  1.27* 1.47* 1.11* 1.31*  --  1.19*  CALCIUM  --  8.2* 8.2* 7.6* 8.5*  --  8.2*  MG 1.5* 1.8 1.6* 2.0  --   --  2.0  PHOS  --   --  3.7 3.3  --   --  4.6    GFR: Estimated Creatinine Clearance: 34 mL/min (A) (by C-G formula based on SCr of 1.19 mg/dL (H)). Recent Labs  Lab 03/08/22 0932 03/08/22 1232 03/09/22 0405 03/12/22 1837 03/13/22 0411 03/14/22 0445 03/15/22 0118  WBC  --   --    < > 10.5 9.8 12.6* 12.0*  LATICACIDVEN 0.8 0.6  --   --   --   --   --    < > = values in this interval not displayed.     Liver Function Tests: Recent Labs  Lab 03/12/22 1837 03/13/22 0411 03/14/22 0445 03/14/22 0943 03/15/22 0118  AST 28 22 194* 225* 146*  ALT 53* 46* 138* 179* 174*  ALKPHOS 199* 175* 706* 874* 775*  BILITOT 0.7 0.8 1.9* 2.5* 2.5*  PROT 5.4* 4.7* 4.5* 5.6* 5.4*  ALBUMIN 1.9* 1.7* 1.7* 2.0* 2.0*    No results for input(s): "LIPASE", "AMYLASE" in the last 168 hours.  Recent Labs  Lab 03/14/22 1303  AMMONIA 24     ABG    Component Value Date/Time   PHART 7.408 03/14/2022 1715   PCO2ART 42.6 03/14/2022 1715   PO2ART 280 (H)  03/14/2022 1715   HCO3 26.9 03/14/2022 1715   TCO2 28 03/14/2022 1715   O2SAT 100 03/14/2022 1715     Coagulation Profile: Recent Labs  Lab 03/12/22 1837 03/13/22 1543 03/14/22 1303  INR 1.1 1.1 1.3*     Cardiac Enzymes: Recent Labs  Lab 03/13/22 0411  CKTOTAL 24*     HbA1C: Hgb A1c MFr Bld  Date/Time Value Ref Range Status  03/15/2022 01:18 AM 5.9 (H) 4.8 - 5.6 % Final    Comment:    (NOTE) Pre diabetes:          5.7%-6.4%  Diabetes:              >6.4%  Glycemic control for   <7.0% adults with diabetes     CBG: Recent Labs  Lab 03/14/22 1935 03/14/22 2344 03/15/22 0343 03/15/22 0510 03/15/22 0549  GLUCAP 166* 168* 76 69* 150*     Review of Systems:   Negative except for what is state in HPI   Past Medical History:  She,  has a past medical history of Cancer ([place]), Hyperlipidemia, and Hypertension.   Surgical History:   Past Surgical History:  Procedure Laterality Date   ABDOMINAL HYSTERECTOMY     BILIARY BRUSHING  03/05/2022   Procedure: BILIARY BRUSHING;  Surgeon: Carol Ada, MD;  Location: [facility] ENDOSCOPY;  Service: Gastroenterology;;   BILIARY STENT PLACEMENT  03/05/2022   Procedure: BILIARY STENT PLACEMENT;  Surgeon: Carol Ada, MD;  Location: [facility] ENDOSCOPY;  Service: Gastroenterology;;   COLONOSCOPY     02/21/2002 adenomatous polyp, 03/23/2011   COLONOSCOPY W/ POLYPECTOMY  02/2000   TA polyp removed from ascending colon.  performed through [facility] (? affiliate in [place])   COLONOSCOPY WITH PROPOFOL N/A 11/03/2016   Procedure: COLONOSCOPY WITH PROPOFOL;  Surgeon: Manya Silvas, MD;  Location: [facility] ENDOSCOPY;  Service: Endoscopy;  Laterality: N/A;   ERCP N/A 03/05/2022   Procedure: ENDOSCOPIC RETROGRADE CHOLANGIOPANCREATOGRAPHY (ERCP);  Surgeon: Carol Ada, MD;  Location: [place];  Service: Gastroenterology;  Laterality: N/A;   HEMORROIDECTOMY     TONSILLECTOMY       Social History:   reports that she has quit smoking.  She has never used smokeless tobacco. She reports that she does not drink alcohol and does not use drugs.  Family History:  Her family history is negative for Breast cancer.   Allergies No Known Allergies   Home Medications  Prior to Admission medications   Medication Sig Start Date End Date Taking? Authorizing Provider  SYMBICORT 160-4.5 MCG/ACT inhaler Inhale 2 puffs into the lungs as needed (for wheezing or shortness of breath). 02/05/16  Yes [provider]  aspirin EC 81 MG tablet Take 81 mg by mouth daily.    [provider]  ezetimibe (ZETIA) 10 MG tablet Take 10 mg by mouth daily. 12/12/21   [provider]  fluvastatin XL (LESCOL XL) 80 MG 24 hr tablet Take 80 mg by mouth daily. 02/19/16   [provider]  valsartan-hydrochlorothiazide (DIOVAN-HCT) 320-25 MG tablet Take 1 tablet by mouth daily. 01/28/16   [provider]     Critical care time: Julian, DO Internal Medicine Resident PGY-1 Pager: [phone]

## 2022-03-15 NOTE — Progress Notes (Signed)
RT NOTE: Patient placed on CPAP/PSV of 12/5 at 0753.  Tolerating well at this time.  Will continue to monitor.

## 2022-03-15 NOTE — Progress Notes (Signed)
Palliative:  I discussed with Dr. Tacy Learn. He has had goals of care conversation with family yesterday and goals are clear for now. He states no palliative needs at this time. I will sign-off but please re-consult when palliative can be of assistance.   No charge  Vinie Sill, NP Palliative Medicine Team Pager [phone] (Please see [email or web address] for schedule) Team Phone [phone]

## 2022-03-15 NOTE — Progress Notes (Signed)
EEG LTM hook up. Non MRI leads. [facility] monitoring. Push button tested.

## 2022-03-15 NOTE — Progress Notes (Signed)
[facility] Progress Note Patient Name: Shirley Nichols DOB: 1941/03/11 MRN: [id number]   Date of Service  03/15/2022  HPI/Events of Note  Ventilator asynchrony - Nursing reports that the patient is on synchronous with the mechanical ventilator after Fentanyl IV pushes.   eICU Interventions  Plan: Fentanyl IV infusion - Titrate to RASS = 0 to -1. Wean Propofol IV infusion as tolerated.      Intervention Category Major Interventions: Respiratory failure - evaluation and management  Ranae Casebier [place] 03/15/2022, 2:10 AM

## 2022-03-15 NOTE — Progress Notes (Signed)
PT Cancellation Note  Patient Details Name: Shirley Nichols MRN: [id number] DOB: [birth year]   Cancelled Treatment:    Reason Eval/Treat Not Completed: Medical issues which prohibited therapy (Pt is intubated and sedated.  Nurse asked to [place] 03/15/2022, 8:49 AM Kacey Dysert M,PT Acute Rehab Services [phone]

## 2022-03-15 NOTE — Progress Notes (Signed)
eLink Physician-Brief Progress Note Patient Name: Shirley Nichols DOB: 1940-09-28 MRN: [id number]   Date of Service  03/15/2022  HPI/Events of Note  Hyperglycemia - Blood glucose = 168.  eICU Interventions  Plan: Q 4 hour sensitive Novolog SSI,     Intervention Category Major Interventions: Hyperglycemia - active titration of insulin therapy  Ardice Boyan [place] 03/15/2022, 1:04 AM

## 2022-03-15 NOTE — Inpatient Diabetes Management (Signed)
Inpatient Diabetes Program Recommendations  AACE/ADA: New Consensus Statement on Inpatient Glycemic Control (2015)  Target Ranges:  Prepandial:   less than 140 mg/dL      Peak postprandial:   less than 180 mg/dL (1-2 hours)      Critically ill patients:  140 - 180 mg/dL   Lab Results  Component Value Date   GLUCAP 86 03/15/2022   HGBA1C 5.9 (H) 03/15/2022    Latest Reference Range & Units 03/14/22 19:35 03/14/22 23:44 03/15/22 03:43 03/15/22 05:10 03/15/22 05:49 03/15/22 07:43  Glucose-Capillary 70 - 99 mg/dL 166 (H) 168 (H) Novolog 2 units 76 69 (L) 150 (H) 86  (H): Data is abnormally high (L): Data is abnormally low  Inpatient Diabetes Program Recommendations:   Patient had hypoglycemia post Novolog correction. Please consider: -Decrease Novolog correction to 0-6 units q 4 hrs.  Thank you, Shirley Nichols. Shirley Knoke, RN, MSN, CDE  Diabetes Coordinator Inpatient Glycemic Control Team Team Pager [phone] (8am-5pm) 03/15/2022 10:26 AM

## 2022-03-16 DIAGNOSIS — R569 Unspecified convulsions: Secondary | ICD-10-CM | POA: Diagnosis not present

## 2022-03-16 DIAGNOSIS — K831 Obstruction of bile duct: Secondary | ICD-10-CM | POA: Diagnosis not present

## 2022-03-16 DIAGNOSIS — J9601 Acute respiratory failure with hypoxia: Secondary | ICD-10-CM | POA: Diagnosis not present

## 2022-03-16 DIAGNOSIS — C250 Malignant neoplasm of head of pancreas: Secondary | ICD-10-CM | POA: Diagnosis not present

## 2022-03-16 DIAGNOSIS — R7989 Other specified abnormal findings of blood chemistry: Secondary | ICD-10-CM | POA: Diagnosis not present

## 2022-03-16 DIAGNOSIS — N179 Acute kidney failure, unspecified: Secondary | ICD-10-CM | POA: Diagnosis not present

## 2022-03-16 DIAGNOSIS — G9340 Encephalopathy, unspecified: Secondary | ICD-10-CM | POA: Diagnosis not present

## 2022-03-16 LAB — CBC
HCT: 26.5 % — ABNORMAL LOW (ref 36.0–46.0)
Hemoglobin: 8.5 g/dL — ABNORMAL LOW (ref 12.0–15.0)
MCH: 26.4 pg (ref 26.0–34.0)
MCHC: 32.1 g/dL (ref 30.0–36.0)
MCV: 82.3 fL (ref 80.0–100.0)
Platelets: 304 10*3/uL (ref 150–400)
RBC: 3.22 MIL/uL — ABNORMAL LOW (ref 3.87–5.11)
RDW: 15.8 % — ABNORMAL HIGH (ref 11.5–15.5)
WBC: 10.5 10*3/uL (ref 4.0–10.5)
nRBC: 0 % (ref 0.0–0.2)

## 2022-03-16 LAB — COMPREHENSIVE METABOLIC PANEL
ALT: 95 U/L — ABNORMAL HIGH (ref 0–44)
AST: 44 U/L — ABNORMAL HIGH (ref 15–41)
Albumin: 1.7 g/dL — ABNORMAL LOW (ref 3.5–5.0)
Alkaline Phosphatase: 529 U/L — ABNORMAL HIGH (ref 38–126)
Anion gap: 7 (ref 5–15)
BUN: 12 mg/dL (ref 8–23)
CO2: 27 mmol/L (ref 22–32)
Calcium: 8.2 mg/dL — ABNORMAL LOW (ref 8.9–10.3)
Chloride: 103 mmol/L (ref 98–111)
Creatinine, Ser: 1.37 mg/dL — ABNORMAL HIGH (ref 0.44–1.00)
GFR, Estimated: 39 mL/min — ABNORMAL LOW (ref >60)
Glucose, Bld: 120 mg/dL — ABNORMAL HIGH (ref 70–99)
Potassium: 3.8 mmol/L (ref 3.5–5.1)
Sodium: 137 mmol/L (ref 135–145)
Total Bilirubin: 1 mg/dL (ref 0.3–1.2)
Total Protein: 4.8 g/dL — ABNORMAL LOW (ref 6.5–8.1)

## 2022-03-16 LAB — CYTOLOGY - NON PAP

## 2022-03-16 LAB — GLUCOSE, CAPILLARY
Glucose-Capillary: 109 mg/dL — ABNORMAL HIGH (ref 70–99)
Glucose-Capillary: 119 mg/dL — ABNORMAL HIGH (ref 70–99)
Glucose-Capillary: 140 mg/dL — ABNORMAL HIGH (ref 70–99)
Glucose-Capillary: 101 mg/dL — ABNORMAL HIGH (ref 70–99)
Glucose-Capillary: 90 mg/dL (ref 70–99)
Glucose-Capillary: 96 mg/dL (ref 70–99)

## 2022-03-16 LAB — PHOSPHORUS
Phosphorus: 3.6 mg/dL (ref 2.5–4.6)
Phosphorus: 3.6 mg/dL (ref 2.5–4.6)

## 2022-03-16 LAB — MAGNESIUM
Magnesium: 1.5 mg/dL — ABNORMAL LOW (ref 1.7–2.4)
Magnesium: 2 mg/dL (ref 1.7–2.4)

## 2022-03-16 MED ORDER — MAGNESIUM SULFATE 4 GM/100ML IV SOLN
4.0000 g | Freq: Once | INTRAVENOUS | Status: AC
  Administered 2022-03-16: 4 g via INTRAVENOUS
  Filled 2022-03-16: qty 100

## 2022-03-16 MED ORDER — SODIUM CHLORIDE 0.9 % IV SOLN
100.0000 mg | Freq: Three times a day (TID) | INTRAVENOUS | Status: DC
  Administered 2022-03-16 – 2022-03-18 (×8): 100 mg via INTRAVENOUS
  Filled 2022-03-16 (×12): qty 2

## 2022-03-16 MED ORDER — KETOROLAC TROMETHAMINE 15 MG/ML IJ SOLN
15.0000 mg | Freq: Once | INTRAMUSCULAR | Status: AC
  Administered 2022-03-16: 15 mg via INTRAVENOUS
  Filled 2022-03-16: qty 1

## 2022-03-16 MED ORDER — FUROSEMIDE 10 MG/ML IJ SOLN
40.0000 mg | INTRAMUSCULAR | Status: AC
  Administered 2022-03-16: 40 mg via INTRAVENOUS
  Filled 2022-03-16: qty 4

## 2022-03-16 MED ORDER — SENNA 8.6 MG PO TABS: 1.0000 | ORAL_TABLET | Freq: Two times a day (BID) | ORAL | Status: DC

## 2022-03-16 MED ORDER — PHENYTOIN SODIUM 50 MG/ML IJ SOLN
100.0000 mg | Freq: Three times a day (TID) | INTRAMUSCULAR | Status: DC
  Filled 2022-03-16: qty 2

## 2022-03-16 MED ORDER — PREGABALIN 75 MG PO CAPS
75.0000 mg | ORAL_CAPSULE | Freq: Three times a day (TID) | ORAL | Status: DC
  Filled 2022-03-16: qty 1

## 2022-03-16 MED ORDER — OXYCODONE HCL 5 MG PO TABS
2.5000 mg | ORAL_TABLET | Freq: Three times a day (TID) | ORAL | Status: DC | PRN
  Administered 2022-03-17 – 2022-03-18 (×2): 2.5 mg via ORAL
  Filled 2022-03-16 (×2): qty 1

## 2022-03-16 NOTE — Progress Notes (Signed)
NAME:  Shirley Nichols, MRN:  [id number], DOB:  1940/12/14, LOS: 74 ADMISSION DATE:  03/03/2022, CONSULTATION DATE:  03/12/2022 REFERRING MD:  Meredith Mody, CHIEF COMPLAINT:  Seizure   History of Present Illness:  81 year old-woman admitted to [facility] 10/6 for elevated liver enzymes and AKI with ultrasound showing CBD obstruction and pancreatic mass confirmed on MRCP.  She was transferred to [facility] and underwent ERCP stent placement on 10/8 with biopsy confirming adenocarcinoma.  She had postprocedure pancreatitis and developed increasing confusion on 10/10 with delirium on 10/13, MRI was negative.  She had witnessed focal right-sided seizure with left gaze preference, head CT was negative for CVA.  She was given 8 mg of Ativan in divided doses, loaded with Keppra and transferred to the ICU for LTM EEG  Pertinent  Medical History  HTN, HLD, Tobacco use   Significant Hospital Events: Including procedures, antibiotic start and stop dates in addition to other pertinent events   10/6-transferred to [facility] 10/7 and 10/8-GI Dr. Benson Norway consulted ERCP stent placement 10/9-post ERCP pancreatitis with AKI, pain and discomfort placed on pain meds 10/10 pancreatitis resolved but increasing confusion 10/12 Seen by Optho--unlikely ocular issue realted to visual changes 10/13 Delirious--MRI brain no lesion of casue for visual issues. PCCM consulted, transferred to ICU for cEEG and further monitoring 10/14  more awake and responsive  10/15 more focal seizures, reconsult  Interim History / Subjective:  Patient is intubated and sedated on my exam.  No overnight events.  On my exam, patient is able to awaken to my voice.  Patient can follow commands.    Objective   Blood pressure (!) 135/57, pulse 92, temperature 98.8 F (37.1 C), temperature source Oral, resp. rate 12, SpO2 98 %. CVP:  [9 mmHg-12 mmHg] 12 mmHg  Vent Mode: CPAP;PSV FiO2 (%):  [40 %] 40 % Set Rate:  [15 bmp] 15 bmp Vt Set:  [430  mL] 430 mL PEEP:  [5 cmH20] 5 cmH20 Pressure Support:  [5 cmH20] 5 cmH20 Plateau Pressure:  [16 cmH20] 16 cmH20   Intake/Output Summary (Last 24 hours) at 03/16/2022 0845 Last data filed at 03/16/2022 0800 Gross per 24 hour  Intake 1707.72 ml  Output 610 ml  Net 1097.72 ml   There were no vitals filed for this visit.  Examination:  General: Patient is intubated and sedated. Eyes: Pupils equal and reactive to light, unable to track Head: Normocephalic, atraumatic  Neck: Normal range of motion Cardio: Regular rate and rhythm, no murmur, rubs, or gallops. Chest: No chest tenderness Pulmonary: Coarse breath sounds throughout Abdomen: Soft, nontender. Neuro: Debated and sedated.  Pupils equal and reactive.  Gag reflex present.  Patient can withdraw to pain.  Resolved Hospital Problem list   Post ERCP pancreatitis   Assessment & Plan:  This is a 81 year old female with newly diagnosed pancreatic cancer who presented back to PCCM with seizure like activity again. Patient admitted to ICU for further treatment and work up of seizure like activity.   #Focal status epilepticus #Acute encephalopathy Patient continues to be intubated and sedated.  On my exam this morning, patient is able to follow commands.  Patient is able to move extremities to commands.  Patient is able to open eyes to verbal stimuli.  EEG has not shown any seizures.  Per exam, patient did have right focal twitching noted yesterday, but none today. Etiology is still uncertain, but leading theory could be mesial frontal focus per neurology.  Neurology still following.  Electrolytes are still being managed.  CSF cytology showing no malignant cells identified. -LP unrevealing -Neurology following, appreciate recs -Continue Keppra 1048m BID -Dilantin 1029mBID -Frequent neuro checks  -Per neuro consider adding Lyrica today  #Acute respiratory failure secondary to above Patient continues to be mechanically ventilated.   Patient required mechanical ventilation after patient was deemed not to have mental awareness for airway protection.  BALs with no growth at this time.  We will continue to follow cultures.  Continue to monitor respiratory status -Continue Brovana and Pulmicort nebs -Continue to wean vent settings -Continue tube feeds -VAP bundle in place -Continue to monitor respiratory status -EEG -Consider extubation if patient does not need procedure  #Hypotension, resolved Patient is remaining normotensive to hypertensive.  Patient not requiring Levophed at this time.  Map goal greater than 65.  -Monitor blood pressure -Levophed as needed to keep MAP greater than 65  #Obstructive jaundice status post CBD stent #Pancreatic adenocarcinoma  #Post ERCP pancreatitis, resolved  Liver enzymes stable from yesterday with current AST 44.  ALT 95.  Alk phos 529.  T. bili 1.0.  GI following who will proceed with ERCP either on Friday, 03/17/2022.  Seems as if this self resolved as numbers are downtrending.  We will see if GI will abstain from procedure at this point. Will closely monitor  -CTM -Numbers are downtrending -GI following, appreciate recs -Outpatient follow up for pancreatic adenocarcinoma -Surgery will see patient outpatient -Oncology following who states that neoadjuvant Chemotherapy would be beneficial  #Hypokalemia,resolved   #Hypomagnesemia Potassium 3.8 today.  Mag 1.5 today.  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  GFR 39.  Patient has been fluctuating during hospitalization.  Unsure if this is her baseline versus if this is a AKI.  Patient is on tube feeds. -Continue to monitor BMP -Lasix 40 mg IV once today -Monitor kidney function  Best Practice (right click and "Reselect all SmartList Selections" daily)   Diet/type: Tube feeds DVT prophylaxis: Lovenox  GI prophylaxis:  PPI Lines: right PICC line Foley:  Yes, and it is still needed Code Status: Partial code, no chest compressions or shocks, but yes to intubation.  Labs   CBC: Recent Labs  Lab 03/12/22 1837 03/13/22 0411 03/14/22 0445 03/14/22 1715 03/15/22 0118 03/16/22 0525  WBC 10.5 9.8 12.6*  --  12.0* 10.5  NEUTROABS 8.2*  --   --   --   --   --   HGB 9.9* 8.7* 8.9* 8.5* 9.8* 8.5*  HCT 30.3* 26.8* 26.4* 25.0* 28.6* 26.5*  MCV 80.2 80.2 79.8*  --  79.7* 82.3  PLT 327 286 276  --  336 [id number]  Basic Metabolic Panel: Recent Labs  Lab 03/13/22 0411 03/14/22 0445 03/14/22 0943 03/14/22 1715 03/15/22 0118 03/15/22 1622 03/16/22 0525  NA 139 135 136 134* 135  --  137  K 2.9* 3.5 4.5 3.7 4.1  --  3.8  CL 103 107 102  --  100  --  103  CO2 24 22 24   --  25  --  27  GLUCOSE 89 332* 146*  --  144*  --  120*  BUN 16 11 12   --  12  --  12  CREATININE 1.47* 1.11* 1.31*  --  1.19*  --  1.37*  CALCIUM 8.2* 7.6* 8.5*  --  8.2*  --  8.2*  MG 1.6* 2.0  --   --  2.0 1.6* 1.5*  PHOS 3.7 3.3  --   --  4.6 3.5 3.6   GFR: Estimated Creatinine Clearance: 29.5 mL/min (A) (by C-G formula based on SCr of 1.37 mg/dL (H)). Recent Labs  Lab 03/13/22 0411 03/14/22 0445 03/15/22 0118 03/16/22 0525  WBC 9.8 12.6* 12.0* 10.5    Liver Function Tests: Recent Labs  Lab 03/13/22 0411 03/14/22 0445 03/14/22 0943 03/15/22 0118 03/16/22 0525  AST 22 194* 225* 146* 44*  ALT 46* 138* 179* 174* 95*  ALKPHOS 175* 706* 874* 775* 529*  BILITOT 0.8 1.9* 2.5* 2.5* 1.0  PROT 4.7* 4.5* 5.6* 5.4* 4.8*  ALBUMIN 1.7* 1.7* 2.0* 2.0* 1.7*   No results for input(s): "LIPASE", "AMYLASE" in the last 168 hours.  Recent Labs  Lab 03/14/22 1303  AMMONIA 24    ABG    Component Value Date/Time   PHART 7.408 03/14/2022 1715   PCO2ART 42.6 03/14/2022 1715   PO2ART 280 (H) 03/14/2022 1715   HCO3 26.9 03/14/2022 1715   TCO2 28 03/14/2022 1715   O2SAT 100 03/14/2022 1715     Coagulation Profile: Recent Labs   Lab 03/12/22 1837 03/13/22 1543 03/14/22 1303  INR 1.1 1.1 1.3*    Cardiac Enzymes: Recent Labs  Lab 03/13/22 0411  CKTOTAL 24*    HbA1C: Hgb A1c MFr Bld  Date/Time Value Ref Range Status  03/15/2022 01:18 AM 5.9 (H) 4.8 - 5.6 % Final    Comment:    (NOTE) Pre diabetes:          5.7%-6.4%  Diabetes:              >6.4%  Glycemic control for   <7.0% adults with diabetes     CBG: Recent Labs  Lab 03/15/22 1523 03/15/22 1943 03/15/22 2354 03/16/22 0349 03/16/22 0756  GLUCAP 106* 115* 101* 109* 119*    Review of Systems:   Negative except for what is state in HPI   Past Medical History:  She,  has a past medical history of Cancer ([place]), Hyperlipidemia, and Hypertension.   Surgical History:   Past Surgical History:  Procedure Laterality Date   ABDOMINAL HYSTERECTOMY     BILIARY BRUSHING  03/05/2022   Procedure: BILIARY BRUSHING;  Surgeon: Carol Ada, MD;  Location: [facility] ENDOSCOPY;  Service: Gastroenterology;;   BILIARY STENT PLACEMENT  03/05/2022   Procedure: BILIARY STENT PLACEMENT;  Surgeon: Carol Ada, MD;  Location: [facility] ENDOSCOPY;  Service: Gastroenterology;;   COLONOSCOPY     02/21/2002 adenomatous polyp, 03/23/2011   COLONOSCOPY W/ POLYPECTOMY  02/2000   TA polyp removed from ascending colon.  performed through [facility] (? affiliate in [place])   COLONOSCOPY WITH PROPOFOL N/A 11/03/2016   Procedure: COLONOSCOPY WITH PROPOFOL;  Surgeon: Manya Silvas, MD;  Location: [facility] ENDOSCOPY;  Service: Endoscopy;  Laterality: N/A;   ERCP N/A 03/05/2022   Procedure: ENDOSCOPIC RETROGRADE CHOLANGIOPANCREATOGRAPHY (ERCP);  Surgeon: Carol Ada, MD;  Location: [place];  Service: Gastroenterology;  Laterality: N/A;   HEMORROIDECTOMY     TONSILLECTOMY       Social History:   reports that she has quit smoking. She has never used smokeless tobacco. She reports that she does not drink alcohol and does not use drugs.   Family History:  Her family history  is negative for Breast cancer.   Allergies No Known Allergies   Home Medications  Prior to Admission medications   Medication Sig Start Date End Date Taking? Authorizing Provider  SYMBICORT 160-4.5 MCG/ACT inhaler Inhale 2  puffs into the lungs as needed (for wheezing or shortness of breath). 02/05/16  Yes [provider]  aspirin EC 81 MG tablet Take 81 mg by mouth daily.    [provider]  ezetimibe (ZETIA) 10 MG tablet Take 10 mg by mouth daily. 12/12/21   [provider]  fluvastatin XL (LESCOL XL) 80 MG 24 hr tablet Take 80 mg by mouth daily. 02/19/16   [provider]  valsartan-hydrochlorothiazide (DIOVAN-HCT) 320-25 MG tablet Take 1 tablet by mouth daily. 01/28/16   [provider]     Critical care time: [place], DO Internal Medicine Resident PGY-1 Pager: [phone]

## 2022-03-16 NOTE — Progress Notes (Signed)
[facility] ADULT ICU REPLACEMENT PROTOCOL   The patient does apply for the [facility] Adult ICU Electrolyte Replacment Protocol based on the criteria listed below:   1.Exclusion criteria: TCTS patients, ECMO patients, and Dialysis patients 2. Is GFR >/= 30 ml/min? Yes.    Patient's GFR today is 39 3. Is SCr </= 2? Yes.   Patient's SCr is 1.37 mg/dL 4. Did SCr increase >/= 0.5 in 24 hours? No. 5.Pt's weight >40kg  Yes.   6. Abnormal electrolyte(s): mag 1.5  7. Electrolytes replaced per protocol 8.  Call MD STAT for K+ </= 2.5, Phos </= 1, or Mag </= 1 Physician:  n/a  Darlys Gales 03/16/2022 6:21 AM

## 2022-03-16 NOTE — Progress Notes (Signed)
PT Cancellation Note  Patient Details Name: Shirley Nichols MRN: [id number] DOB: [birth year]   Cancelled Treatment:    Reason Eval/Treat Not Completed: Medical issues which prohibited therapy (Pt getting extubated.  Will return this pm as time allows.)   Alvira Philips 03/16/2022, 12:03 PM Jobanny Mavis M,PT Acute Rehab Services [phone]

## 2022-03-16 NOTE — Progress Notes (Addendum)
[facility] Progress Note  CC:  Elevated LFTs in setting of pancreatic head mass and obstructive jaundice status post ERCP   Subjective:  Still intubated, sedated, on pressors.  Husband at bedside.  Objective:  Vital signs in last 24 hours: Temp:  [98.2 F (36.8 C)-99.1 F (37.3 C)] 98.8 F (37.1 C) (10/19 0800) Pulse Rate:  [73-105] 92 (10/19 0800) Resp:  [7-20] 12 (10/19 0800) BP: (88-144)/(49-81) 135/57 (10/19 0800) SpO2:  [94 %-100 %] 98 % (10/19 0800) FiO2 (%):  [40 %] 40 % (10/19 0748) Last BM Date : 03/12/22 General:  Intubated/sedated. Heart:  Regular rate and rhythm; no murmurs Pulm:  Clear anteriorly. Abdomen:  Soft, non-distended.  BS present.  Patient withdrew and moved around with palpation of her abdomen.  Extremities:  Without edema.  Intake/Output from previous day: 10/18 0701 - 10/19 0700 In: 1724.1 [I.V.:1290.7; IV Piggyback:433.4] Out: 610 [Urine:610] Intake/Output this shift: Total I/O In: 149.6 [I.V.:11.5; IV Piggyback:138.1] Out: -   Lab Results: Recent Labs    03/14/22 0445 03/14/22 1715 03/15/22 0118 03/16/22 0525  WBC 12.6*  --  12.0* 10.5  HGB 8.9* 8.5* 9.8* 8.5*  HCT 26.4* 25.0* 28.6* 26.5*  PLT 276  --  336 304   BMET Recent Labs    03/14/22 0943 03/14/22 1715 03/15/22 0118 03/16/22 0525  NA 136 134* 135 137  K 4.5 3.7 4.1 3.8  CL 102  --  100 103  CO2 24  --  25 27  GLUCOSE 146*  --  144* 120*  BUN 12  --  12 12  CREATININE 1.31*  --  1.19* 1.37*  CALCIUM 8.5*  --  8.2* 8.2*   LFT Recent Labs    03/14/22 1430 03/15/22 0118 03/16/22 0525  PROT  --    < > 4.8*  ALBUMIN  --    < > 1.7*  AST  --    < > 44*  ALT  --    < > 95*  ALKPHOS  --    < > 529*  BILITOT  --    < > 1.0  BILIDIR 1.9*  --   --    < > = values in this interval not displayed.   PT/INR Recent Labs    03/13/22 1543 03/14/22 1303  LABPROT 14.5 15.7*  INR 1.1 1.3*   DG Abd Portable 1V  Result Date: 03/15/2022 CLINICAL  DATA:  Evaluate location of tip of enteric tube EXAM: PORTABLE ABDOMEN - 1 VIEW COMPARISON:  Previous studies done earlier today FINDINGS: There is slight interval withdrawal of the enteric tube. Tip and side port are still within the fundus of the stomach. Bowel gas pattern is nonspecific. Heart is enlarged in size. Bilateral pleural effusions are seen. IMPRESSION: Tip and side port of enteric tube are noted in the medial aspect of fundus of the stomach. Electronically Signed   By: Elmer Picker M.D.   On: 03/15/2022 14:30   DG Abd Portable 1V  Result Date: 03/15/2022 CLINICAL DATA:  Evaluate location of enteric tube EXAM: PORTABLE ABDOMEN - 1 VIEW COMPARISON:  Previous study done earlier today FINDINGS: There is interval repositioning of enteric tube with its tip in the fundus of the stomach. Biliary stent is seen in the course of distal common bile duct. Bowel gas pattern is nonspecific. IMPRESSION: Tip of enteric tube is seen in the fundus of the stomach. Electronically Signed   By: Elmer Picker M.D.   On: 03/15/2022  14:29   DG Abd Portable 1V  Result Date: 03/15/2022 CLINICAL DATA:  OG tube placement. EXAM: PORTABLE ABDOMEN - 1 VIEW COMPARISON:  One-view chest x-ray 03/14/2022 FINDINGS: Biliary stent is in place.  Bowel gas pattern is unremarkable. NG tube is coiled in the stomach. The tip is directed cephalad in the distal esophagus. The portion directed upward in the esophagus has increased since the previous exam. Bilateral pleural effusions and basilar airspace disease noted. IMPRESSION: 1. NG tube is coiled in the stomach. The tip is directed cephalad in the distal esophagus. 2. Biliary stent in place. Electronically Signed   By: San Morelle M.D.   On: 03/15/2022 08:47   DG CHEST PORT 1 VIEW  Result Date: 03/14/2022 CLINICAL DATA:  Endotracheal tube EXAM: PORTABLE CHEST 1 VIEW COMPARISON:  Earlier today at 2:02 p.m. FINDINGS: 4:45 p.m. Support apparatus: Endotracheal  tube terminates 4.6 cm above carina. Nasogastric tube placed in the interval, looped in the stomach with tip at distal esophagus. Right PICC line tip at superior caval/atrial junction. Heart/mediastinum: Midline trachea.  Moderate cardiomegaly Pleura: Small bilateral pleural effusions are similar. No pneumothorax. Lungs: Left-greater-than-right base airspace disease is not significantly changed.Moderate interstitial edema is increased. Other: Numerous leads and wires project over the chest. IMPRESSION: Appropriate position of endotracheal tube. Nasogastric tube looped in the stomach with tip at the distal esophagus. Similar congestive heart failure with layering bilateral pleural effusions and bibasilar airspace disease. These results will be called to the ordering clinician or representative by the Radiologist Assistant, and communication documented in the PACS or [employer]. Electronically Signed   By: Abigail Miyamoto M.D.   On: 03/14/2022 17:11   DG Abd Portable 1V  Result Date: 03/14/2022 CLINICAL DATA:  Abnormal liver function tests. Evaluate biliary stent EXAM: PORTABLE ABDOMEN - 1 VIEW COMPARISON:  CT chest done on 03/11/2022 FINDINGS: Bowel gas pattern is nonspecific. No abnormal masses or calcifications are seen. Increased density in left lower lung fields may suggest pleural effusion. There is a stent in right side of abdomen. The stent is vertically oriented, possibly positioned in the distal common bile duct and duodenum. IMPRESSION: Nonspecific bowel gas pattern. A biliary stent is noted in right side of abdomen. The stent is more vertically oriented than usual. This may suggest presence of stent in the distal common bile duct and duodenum. Electronically Signed   By: Elmer Picker M.D.   On: 03/14/2022 15:48   DG Chest Port 1 View  Result Date: 03/14/2022 CLINICAL DATA:  Pulmonary aspiration.  Seizure. EXAM: PORTABLE CHEST 1 VIEW COMPARISON:  CT chest 03/11/2022 and chest radiograph  03/08/2022 FINDINGS: Hazy opacities at the lung bases compatible with lower lobe atelectasis or pneumonia likely superimposed on pleural effusions which were better shown on the 03/11/2022 exam. Right PICC line tip: SVC. Mild enlargement of the cardiopericardial silhouette. Indistinct pulmonary vasculature suggesting pulmonary venous hypertension. IMPRESSION: 1. Hazy opacities at the lung bases compatible with lower lobe atelectasis or pneumonia likely superimposed on pleural effusions. 2. Mild enlargement of the cardiopericardial silhouette with indistinct pulmonary vasculature suggesting pulmonary venous hypertension. Electronically Signed   By: Van Clines M.D.   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 AST 146 (225) ALT 174 (179) ALK Phos 774 (874) Total bili 2.5 ( 2.5)  LFTs improved today, 10/19 at AST 44, ALT 95, ALP  529, total bili 1.0. Concerning for possible restenosis or stent migration.  Patient has also been on several medications for seizures which could be contributing to liver function. KUB shows stent in similar position to ERCP films.   Focal status epilepticus and acute encephalopathy MRI and head CT negative Neurology following on Keppra and Vimpat LP unrevealing, pending CSF cytology Worsening mental status changes requiring intubation 10/18 EEG.   Acute respiratory failure secondary to acute encephalopathy and focal epilepticus CCM following patient. Weaning sedation.   Aspiration pneumonia  Completed antibiotics, still had been on 2 L nasal cannula.   AKI on CKD stage IIIb  **Due to improving LFTs, will plan to cancel ERCP for 10/20 and observe for now. **Repeat CMET tomorrow.     LOS: 13 days   Laban Emperor. Zehr  03/16/2022, 9:11 AM    Attending Physician  Note   I have taken an interval history, reviewed the chart and examined the patient. I performed a substantive portion of this encounter, including complete performance of at least one of the key components, in conjunction with the APP. I agree with the APP's note, impression and recommendations with my edits. My additional impressions and recommendations are as follows.   LFTs are now improving and biliary stent appears to be in good position on KUB. Medication related LFT elevation is possible. Cancel ERCP and continue to trend LFTs.   Lucio Edward, MD [facility] See AMION,  GI, for our on call provider

## 2022-03-16 NOTE — Progress Notes (Signed)
Physical Therapy Treatment Patient Details Name: Shirley Nichols MRN: [id number] DOB: [birth year] Today's Date: 03/16/2022   History of Present Illness 81 yo female presents to [facility] on 10/6 with elevated liver enzymes, emesis. Pt recently diagnosed with UTI treated with nitrofurantoin. Ultrasound showing dilated gallbladder and CBD concerning for biliary ductal obstruction. CT of the abdomen and pelvis without IV contrast shows stranding around the pancreatic head concerning for possible pancreatic mass. s/p ERCP with stent placement on 10/8. encephalopathic starting 10/10, suspected aspiration PNA in R lung. Pt with seizures on 10/14 with EEG in place 10/16. Pancreatic adenocarcinoma confirmed. Pt with continued seizures and intubated 10/17.  PMH includes HTN, HLD.    PT Comments    Pt now extubated and remains on continuous EEG. Pt continues to require assist with all mobility and continues to have cognitive deficits. Updated dc recommendation to AIR.    Recommendations for follow up therapy are one component of a multi-disciplinary discharge planning process, led by the attending physician.  Recommendations may be updated based on patient status, additional functional criteria and insurance authorization.  Follow Up Recommendations  Acute inpatient rehab (3hours/day)     Assistance Recommended at Discharge    Patient can return home with the following A lot of help with walking and/or transfers;A lot of help with bathing/dressing/bathroom;Assistance with cooking/housework;Direct supervision/assist for medications management;Assist for transportation;Help with stairs or ramp for entrance   Equipment Recommendations  None recommended by PT    Recommendations for Other Services       Precautions / Restrictions Precautions Precautions: Fall;Other (comment) Precaution Comments: EEG monitor     Mobility  Bed Mobility Overal bed mobility: Needs Assistance Bed Mobility: Supine to Sit,  Sit to Supine     Supine to sit: Mod assist Sit to supine: Mod assist   General bed mobility comments: Assist to bring LE's off of bed, elevate trunk into sitting, and bring hips to EOB. Assist to lower trunk and bring legs back up into the bed.    Transfers Overall transfer level: Needs assistance Equipment used: 2 person hand held assist Transfers: Sit to/from Stand Sit to Stand: +2 physical assistance, Min assist           General transfer comment: Assist to bring hips up and for balance    Ambulation/Gait             Pre-gait activities: Stood x 3 at EOB with +2 min assist. Twice shuffled feet laterally to move up toward head of bed.     Stairs             Wheelchair Mobility    Modified Rankin (Stroke Patients Only)       Balance Overall balance assessment: Needs assistance Sitting-balance support: No upper extremity supported, Feet supported Sitting balance-Leahy Scale: Fair     Standing balance support: Bilateral upper extremity supported, During functional activity Standing balance-Leahy Scale: Poor Standing balance comment: BUE support with min assist                            Cognition Arousal/Alertness: Awake/alert Behavior During Therapy: Flat affect Overall Cognitive Status: Impaired/Different from baseline Area of Impairment: Orientation, Attention, Memory, Following commands, Safety/judgement, Awareness, Problem solving                 Orientation Level: Time Current Attention Level: Sustained Memory: Decreased short-term memory, Decreased recall of precautions Following Commands: Follows one step commands with increased  time Safety/Judgement: Decreased awareness of safety, Decreased awareness of deficits Awareness: Emergent Problem Solving: Slow processing, Decreased initiation, Difficulty sequencing, Requires verbal cues, Requires tactile cues General Comments: Difficulty initiating getting up on EOB. Tried to  lie down upside down in bed.        Exercises      General Comments General comments (skin integrity, edema, etc.): VSS      Pertinent Vitals/Pain Pain Assessment Pain Assessment: Faces Faces Pain Scale: Hurts little more Pain Location: generalized Pain Descriptors / Indicators: Guarding, Grimacing Pain Intervention(s): Limited activity within patient's tolerance, Monitored during session, Repositioned    Home Living                          Prior Function            PT Goals (current goals can now be found in the care plan section) Acute Rehab PT Goals PT Goal Formulation: With patient/family Time For Goal Achievement: 03/30/22 Potential to Achieve Goals: Good Progress towards PT goals: Goals downgraded-see care plan    Frequency    Min 3X/week      PT Plan Discharge plan needs to be updated    Co-evaluation              AM-PAC PT "6 Clicks" Mobility   Outcome Measure  Help needed turning from your back to your side while in a flat bed without using bedrails?: A Lot Help needed moving from lying on your back to sitting on the side of a flat bed without using bedrails?: A Lot Help needed moving to and from a bed to a chair (including a wheelchair)?: Total Help needed standing up from a chair using your arms (e.g., wheelchair or bedside chair)?: Total Help needed to walk in hospital room?: Total Help needed climbing 3-5 steps with a railing? : Total 6 Click Score: 8    End of Session Equipment Utilized During Treatment: Oxygen Activity Tolerance: Patient tolerated treatment well Patient left: in bed;with call bell/phone within reach;with family/visitor present;with nursing/sitter in room Nurse Communication: Mobility status PT Visit Diagnosis: Other abnormalities of gait and mobility (R26.89);Muscle weakness (generalized) (M62.81)     Time: 1287-8676 PT Time Calculation (min) (ACUTE ONLY): 19 min  Charges:  $Therapeutic Activity: 8-22  mins                     [place] [facility] [place] 03/16/2022, 5:31 PM

## 2022-03-16 NOTE — Progress Notes (Signed)
SLP Cancellation Note  Patient Details Name: Shirley Nichols MRN: [id number] DOB: 10-02-1940   Cancelled treatment:       Reason Eval/Treat Not Completed: Medical issues which prohibited therapy (now intubated). Will f/u as able.     Osie Bond., M.A. [place] Office [phone]  Secure chat preferred  03/16/2022, 7:50 AM

## 2022-03-16 NOTE — Procedures (Signed)
Extubation Procedure Note  Patient Details:   Name: Shirley Nichols DOB: Jan 15, 1941 MRN: [id number]   Airway Documentation:    Vent end date: 03/16/22 Vent end time: 1100   Evaluation  O2 sats: stable throughout Complications: No apparent complications Patient did tolerate procedure well. Bilateral Breath Sounds: Clear, Diminished   Yes  RT extubated patient to 4L [place] with RN and resident at bedside per MD order. Positive cuff leak noted. Patient tolerating well at this time. No stridor noted at this time. RT will continue to monitor.   Fabiola Backer 03/16/2022, 11:09 AM

## 2022-03-16 NOTE — Progress Notes (Signed)
Subjective: She continues to have right leg twitching at times.    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  The EEG is negative, but based on semiology I suspect a mesial frontal focus, hence leg predominance. With a deep focus, surface EEG can be negative.   Following cessation of EEG, she had a recurrent episode which involved not only shaking but also mental status changes and eye movement changes requiring intubation.  Given this, I still suspect that the right leg jerks are focal seizures not well seen on EEG. I will add a third AED today.     Recommendations: 1) continue Keppra at '1000mg'$  twice daily 2) continue dilantin '100mg'$  TID   3) lyrica '75mg'$  TID 4) resume LTM EEG 5) Will continue to follow.    Roland Rack, MD [facility] [phone]  If 7pm- 7am, please page neurology on call as listed in [place].

## 2022-03-16 NOTE — Procedures (Addendum)
Patient Name: Shirley Nichols  MRN: [id number]  Epilepsy Attending: Lora Havens  Referring Physician/Provider: Katy Apo, NP  Duration: 03/15/2022 0920 to 03/16/2022 0920   Patient history: 81yo F with right leg tremoring after the MRI and they also noted that she was talking gibberish all afternoon for 4 to 5 hours. At approximately 1830, she was noted to have right arm and leg jerking with left gaze deviation. EEG to evaluate for seizure   Level of alertness: lethargic   AEDs during EEG study: LEV, PHT, Propofol   Technical aspects: This EEG study was done with scalp electrodes positioned according to the 10-20 International system of electrode placement. Electrical activity was reviewed with band pass filter of 1-'70Hz'$ , sensitivity of 7 uV/mm, display speed of 62m/sec with a '60Hz'$  notched filter applied as appropriate. EEG data were recorded continuously and digitally stored.  Video monitoring was available and reviewed as appropriate.   Description: EEG showed continuous generalized 3 to 6 Hz theta-delta slowing. Hyperventilation and photic stimulation were not performed.      Event button was pressed on 03/15/2022 at 1123, 1125, 1142, 1213,1218,1233, [id number],[id number] right lower extremity rhythmic twitching. Concomitant EEG before, during and after the event did not show any EEG change.   ABNORMALITY - Continuous slow, generalized    IMPRESSION: This study is suggestive of moderate to severe diffuse encephalopathy, nonspecific etiology. No epileptiform discharges were seen throughout the recording.   Event button was pressed on 03/15/2022 at 1123, 1125, 1142, [id number] 11282,0813for right lower extremity rhythmic twitching without concomitant EEG change. However, focal motor seizures may not be seen on scalp EEG. Therefore clinical correlation is recommended.     Elwood Bazinet OBarbra Sarks

## 2022-03-16 NOTE — Progress Notes (Signed)
Maintenance done on FP3, P7, CZ, C4

## 2022-03-17 ENCOUNTER — Encounter (HOSPITAL_COMMUNITY)
Admission: AD | Disposition: A | Discharge status: Rehabilitation Facility | Payer: Self-pay | Source: Other Acute Inpatient Hospital | Attending: Internal Medicine

## 2022-03-17 DIAGNOSIS — J9601 Acute respiratory failure with hypoxia: Secondary | ICD-10-CM | POA: Diagnosis not present

## 2022-03-17 DIAGNOSIS — G9340 Encephalopathy, unspecified: Secondary | ICD-10-CM | POA: Diagnosis not present

## 2022-03-17 DIAGNOSIS — K831 Obstruction of bile duct: Secondary | ICD-10-CM | POA: Diagnosis not present

## 2022-03-17 DIAGNOSIS — R569 Unspecified convulsions: Secondary | ICD-10-CM | POA: Diagnosis not present

## 2022-03-17 DIAGNOSIS — N179 Acute kidney failure, unspecified: Secondary | ICD-10-CM | POA: Diagnosis not present

## 2022-03-17 LAB — OLIGOCLONAL BANDS, CSF + SERM

## 2022-03-17 LAB — CBC
HCT: 24.7 % — ABNORMAL LOW (ref 36.0–46.0)
Hemoglobin: 7.9 g/dL — ABNORMAL LOW (ref 12.0–15.0)
MCH: 26.1 pg (ref 26.0–34.0)
MCHC: 32 g/dL (ref 30.0–36.0)
MCV: 81.5 fL (ref 80.0–100.0)
Platelets: 299 10*3/uL (ref 150–400)
RBC: 3.03 MIL/uL — ABNORMAL LOW (ref 3.87–5.11)
RDW: 15.5 % (ref 11.5–15.5)
WBC: 8.4 10*3/uL (ref 4.0–10.5)
nRBC: 0 % (ref 0.0–0.2)

## 2022-03-17 LAB — COMPREHENSIVE METABOLIC PANEL
ALT: 69 U/L — ABNORMAL HIGH (ref 0–44)
AST: 26 U/L (ref 15–41)
Albumin: 1.7 g/dL — ABNORMAL LOW (ref 3.5–5.0)
Alkaline Phosphatase: 390 U/L — ABNORMAL HIGH (ref 38–126)
Anion gap: 10 (ref 5–15)
BUN: 12 mg/dL (ref 8–23)
CO2: 27 mmol/L (ref 22–32)
Calcium: 8.1 mg/dL — ABNORMAL LOW (ref 8.9–10.3)
Chloride: 100 mmol/L (ref 98–111)
Creatinine, Ser: 1.42 mg/dL — ABNORMAL HIGH (ref 0.44–1.00)
GFR, Estimated: 37 mL/min — ABNORMAL LOW (ref >60)
Glucose, Bld: 92 mg/dL (ref 70–99)
Potassium: 3 mmol/L — ABNORMAL LOW (ref 3.5–5.1)
Sodium: 137 mmol/L (ref 135–145)
Total Bilirubin: 1.2 mg/dL (ref 0.3–1.2)
Total Protein: 4.7 g/dL — ABNORMAL LOW (ref 6.5–8.1)

## 2022-03-17 LAB — PHOSPHORUS
Phosphorus: 4.2 mg/dL (ref 2.5–4.6)
Phosphorus: 2.6 mg/dL (ref 2.5–4.6)

## 2022-03-17 LAB — GLUCOSE, CAPILLARY
Glucose-Capillary: 84 mg/dL (ref 70–99)
Glucose-Capillary: 84 mg/dL (ref 70–99)
Glucose-Capillary: 87 mg/dL (ref 70–99)
Glucose-Capillary: 135 mg/dL — ABNORMAL HIGH (ref 70–99)
Glucose-Capillary: 136 mg/dL — ABNORMAL HIGH (ref 70–99)

## 2022-03-17 LAB — CSF CULTURE W GRAM STAIN
Culture: NO GROWTH
Gram Stain: NONE SEEN

## 2022-03-17 LAB — CULTURE, RESPIRATORY W GRAM STAIN: Culture: NO GROWTH

## 2022-03-17 LAB — MAGNESIUM
Magnesium: 1.9 mg/dL (ref 1.7–2.4)
Magnesium: 1.7 mg/dL (ref 1.7–2.4)

## 2022-03-17 SURGERY — ERCP, WITH INTERVENTION IF INDICATED
Anesthesia: General

## 2022-03-17 MED ORDER — ENSURE ENLIVE PO LIQD
237.0000 mL | Freq: Three times a day (TID) | ORAL | Status: DC
  Administered 2022-03-18 – 2022-03-22 (×9): 237 mL via ORAL

## 2022-03-17 MED ORDER — BISACODYL 10 MG RE SUPP
10.0000 mg | Freq: Once | RECTAL | Status: DC
  Filled 2022-03-17: qty 1

## 2022-03-17 MED ORDER — POTASSIUM CHLORIDE 10 MEQ/50ML IV SOLN
10.0000 meq | INTRAVENOUS | Status: AC
  Administered 2022-03-17 (×5): 10 meq via INTRAVENOUS
  Filled 2022-03-17 (×5): qty 50

## 2022-03-17 MED ORDER — POTASSIUM CHLORIDE 20 MEQ PO PACK
40.0000 meq | PACK | Freq: Two times a day (BID) | ORAL | Status: AC
  Administered 2022-03-17: 40 meq via ORAL
  Filled 2022-03-17: qty 2

## 2022-03-17 MED ORDER — SENNOSIDES-DOCUSATE SODIUM 8.6-50 MG PO TABS
1.0000 | ORAL_TABLET | Freq: Two times a day (BID) | ORAL | Status: DC
  Administered 2022-03-17 – 2022-03-23 (×5): 1 via ORAL
  Filled 2022-03-17 (×9): qty 1

## 2022-03-17 MED ORDER — MAGNESIUM SULFATE 2 GM/50ML IV SOLN
2.0000 g | Freq: Once | INTRAVENOUS | Status: AC
  Administered 2022-03-17: 2 g via INTRAVENOUS
  Filled 2022-03-17: qty 50

## 2022-03-17 MED ORDER — ORAL CARE MOUTH RINSE: 15.0000 mL | OROMUCOSAL | Status: DC | PRN

## 2022-03-17 MED ORDER — ENOXAPARIN SODIUM 30 MG/0.3ML IJ SOSY
30.0000 mg | PREFILLED_SYRINGE | INTRAMUSCULAR | Status: DC
  Administered 2022-03-17 – 2022-03-18 (×2): 30 mg via SUBCUTANEOUS
  Filled 2022-03-17 (×3): qty 0.3

## 2022-03-17 MED ORDER — ORAL CARE MOUTH RINSE
15.0000 mL | OROMUCOSAL | Status: DC
  Administered 2022-03-17 – 2022-03-23 (×15): 15 mL via OROMUCOSAL

## 2022-03-17 MED ORDER — SENNOSIDES-DOCUSATE SODIUM 8.6-50 MG PO TABS: 1.0000 | ORAL_TABLET | Freq: Two times a day (BID) | ORAL | Status: DC

## 2022-03-17 MED ORDER — PANTOPRAZOLE 2 MG/ML SUSPENSION: 40.0000 mg | Freq: Every day | ORAL | Status: DC

## 2022-03-17 NOTE — Progress Notes (Signed)
   03/17/22 1800  Vitals  Temp 97.7 F (36.5 C)  Temp Source Oral  BP 138/82  MAP (mmHg) 89  BP Location Left Arm  BP Method Automatic  Patient Position (if appropriate) Lying  Pulse Rate 83  Pulse Rate Source Monitor  ECG Heart Rate 83  Resp 16  Level of Consciousness  Level of Consciousness Alert  MEWS COLOR  MEWS Score Color Green  Oxygen Therapy  SpO2 96 %  O2 Device Nasal Cannula  O2 Flow Rate (L/min) 2 L/min  Pain Assessment  Pain Scale 0-10  Pain Score 0  Glasgow Coma Scale  Eye Opening 4  Best Verbal Response (NON-intubated) 4  Best Motor Response 6  Glasgow Coma Scale Score 14  MEWS Score  MEWS Temp 0  MEWS Systolic 0  MEWS Pulse 0  MEWS RR 0  MEWS LOC 0  MEWS Score 0   Pt admitted to 4E RM12 CCMD notified Call bell within reach

## 2022-03-17 NOTE — Progress Notes (Signed)
Inpatient Rehab Coordinator Note:  I met with patient and spouse at bedside to discuss CIR recommendations and goals/expectations of CIR stay.  We reviewed 3 hrs/day of therapy, physician follow up, and average length of stay 2 weeks (dependent upon progress) with goals of Mod I.  Spouse states that they live in [place] and he would like to have her there at discharge for convenience. I will follow back up with husband next week to ensure this is what he still wants.   Rehab Admissons Coordinator  , PT, GCS [phone]     

## 2022-03-17 NOTE — Progress Notes (Signed)
Physical Therapy Treatment Patient Details Name: Shirley Nichols MRN: [id number] DOB: January 22, 1941 Today's Date: 03/17/2022   History of Present Illness 81 yo female presents to [facility] on 10/6 with elevated liver enzymes, emesis. Pt recently diagnosed with UTI treated with nitrofurantoin. Ultrasound showing dilated gallbladder and CBD concerning for biliary ductal obstruction. CT of the abdomen and pelvis without IV contrast shows stranding around the pancreatic head concerning for possible pancreatic mass. s/p ERCP with stent placement on 10/8. encephalopathic starting 10/10, suspected aspiration PNA in R lung. Pt with seizures on 10/14 with EEG in place 10/16. Pancreatic adenocarcinoma confirmed. Pt with continued seizures and intubated 10/17.  PMH includes HTN, HLD.    PT Comments    Pt admitted with above diagnosis. Pt was able to sit EOB with min guard assist as well as stand to RW with min guard assist (+2 for safety). Pt was dizzy upon standing however VSS.  Pt is difficult to assess at times as she closes eyes often and does not respond readily at times.  Pt with poor endurance as well. Will continue to follow acutely.  Pt currently with functional limitations due to balance and endurance deficits. Pt will benefit from skilled PT to increase their independence and safety with mobility to allow discharge to the venue listed below.      Recommendations for follow up therapy are one component of a multi-disciplinary discharge planning process, led by the attending physician.  Recommendations may be updated based on patient status, additional functional criteria and insurance authorization.  Follow Up Recommendations  Acute inpatient rehab (3hours/day)     Assistance Recommended at Discharge Frequent or constant Supervision/Assistance  Patient can return home with the following A lot of help with walking and/or transfers;A lot of help with bathing/dressing/bathroom;Assistance with  cooking/housework;Direct supervision/assist for medications management;Assist for transportation;Help with stairs or ramp for entrance   Equipment Recommendations  None recommended by PT    Recommendations for Other Services       Precautions / Restrictions Precautions Precautions: Fall;Other (comment) Precaution Comments: EEG monitor Restrictions Weight Bearing Restrictions: No     Mobility  Bed Mobility Overal bed mobility: Needs Assistance Bed Mobility: Supine to Sit, Sit to Supine     Supine to sit: Min assist Sit to supine: Min assist   General bed mobility comments: No Assist to bring LE's off of bed, pt pulled up on OT to  elevate trunk into sitting, and incr time for pt to scoot hips to EOB. Assist to lower trunk and bring legs back up into the bed.    Transfers Overall transfer level: Needs assistance Equipment used: 2 person hand held assist Transfers: Sit to/from Stand Sit to Stand: Min guard, +2 safety/equipment, From elevated surface           General transfer comment: Pt able to stand with cues for hand placement.  Cues for taking side steps to [facility] with pt c/o dizziness and needed to sit x 1.  Pt closes eyes frequently.  No change in HR or BP with activity.    Ambulation/Gait                   Stairs             Wheelchair Mobility    Modified Rankin (Stroke Patients Only)       Balance Overall balance assessment: Needs assistance Sitting-balance support: No upper extremity supported, Feet supported, Bilateral upper extremity supported Sitting balance-Leahy Scale: Poor Sitting balance -  Comments: relies on UE support to sit EOB initially and progressed to no UE support.   Standing balance support: Bilateral upper extremity supported, During functional activity Standing balance-Leahy Scale: Poor Standing balance comment: BUE support with min guard assist and RW.  Pt could only stand about 1 min each attempt due to dizziness.                             Cognition Arousal/Alertness: Awake/alert Behavior During Therapy: Flat affect Overall Cognitive Status: Impaired/Different from baseline Area of Impairment: Orientation, Attention, Memory, Following commands, Safety/judgement, Awareness, Problem solving                 Orientation Level: Time, Situation Current Attention Level: Sustained Memory: Decreased short-term memory, Decreased recall of precautions Following Commands: Follows one step commands with increased time Safety/Judgement: Decreased awareness of safety, Decreased awareness of deficits Awareness: Emergent Problem Solving: Slow processing, Decreased initiation, Difficulty sequencing, Requires verbal cues, Requires tactile cues General Comments: Difficulty initiating getting up on EOB. Needed repetition of commands at times.        Exercises General Exercises - Lower Extremity Ankle Circles/Pumps: AROM, Both, 10 reps, Seated Long Arc Quad: AROM, Both, 10 reps, Seated    General Comments General comments (skin integrity, edema, etc.): VSS      Pertinent Vitals/Pain Pain Assessment Pain Assessment: No/denies pain    Home Living                          Prior Function            PT Goals (current goals can now be found in the care plan section) Progress towards PT goals: Progressing toward goals    Frequency    Min 3X/week      PT Plan Current plan remains appropriate    Co-evaluation PT/OT/SLP Co-Evaluation/Treatment: Yes Reason for Co-Treatment: Complexity of the patient's impairments (multi-system involvement);For patient/therapist safety PT goals addressed during session: Mobility/safety with mobility        AM-PAC PT "6 Clicks" Mobility   Outcome Measure  Help needed turning from your back to your side while in a flat bed without using bedrails?: A Lot Help needed moving from lying on your back to sitting on the side of a flat bed without  using bedrails?: A Lot Help needed moving to and from a bed to a chair (including a wheelchair)?: Total Help needed standing up from a chair using your arms (e.g., wheelchair or bedside chair)?: Total Help needed to walk in hospital room?: Total Help needed climbing 3-5 steps with a railing? : Total 6 Click Score: 8    End of Session Equipment Utilized During Treatment: Oxygen;Gait belt Activity Tolerance: Patient limited by fatigue (limited by dizziness) Patient left: in bed;with call bell/phone within reach;with family/visitor present;with bed alarm set Nurse Communication: Mobility status PT Visit Diagnosis: Other abnormalities of gait and mobility (R26.89);Muscle weakness (generalized) (M62.81)     Time: 7673-4193 PT Time Calculation (min) (ACUTE ONLY): 26 min  Charges:  $Therapeutic Activity: 8-22 mins                     [facility] M,PT Acute Rehab Services [phone]    Alvira Philips 03/17/2022, 2:23 PM

## 2022-03-17 NOTE — Progress Notes (Signed)
Subjective: Though there was a report of some leg twitchign, patient denies it. I am not sure if what was witnessed was the actual clonic activity seen previously.   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  The EEG is negative, but based on semiology I suspect a mesial frontal focus, hence leg predominance. With a deep focus, surface EEG can be negative.   Following cessation of EEG, she had a recurrent episode which involved not only shaking but also mental status changes and eye movement changes requiring intubation.  Given this, I still suspect that the right leg jerks are focal seizures not well seen on EEG. I will continue EEG monitoring for now in case she has another large episode.     Recommendations: 1) continue Keppra '1000mg'$  twice daily 2) continue dilantin '100mg'$  TID   3) If further leg clonic activity, consider starting lyrica '75mg'$  tid.  4) LTM EEG 5) Will continue to follow.    Roland Rack, MD [facility] [phone]  If 7pm- 7am, please page neurology on call as listed in [place].

## 2022-03-17 NOTE — Progress Notes (Signed)
Speech Language Pathology Treatment: Dysphagia  Patient Details Name: Shirley Nichols MRN: [id number] DOB: 03/25/1941 Today's Date: 03/17/2022 Time: 6270-3500 SLP Time Calculation (min) (ACUTE ONLY): 14 min  Assessment / Plan / Recommendation Clinical Impression  Pt was seen for ongoing assessment of swallowing after extubation on previous date. Note that upon extubation she did not pass a yale swallow screen with nursing. Upon initial presentation today she is asleep and says she feels like it's hard to keep her eyes open/wake up. Upon repositioning, turning lights on, and presenting POs, she still keeps her eyes closed often but she is an active participant in self-feeding and swallowing. She sounds a little dysphonic, with softer vocal quality, but as session continued and she woke up a little more her voice sounded clearer and stronger. Oropharyngeal swallow appears to be functional and near baseline as observed prior to intubation without any overt coughing. One delayed throat clear was noted after all POs had been offered but this has been noted as early as her initial SLP eval this admission. Recommend starting with mechanical soft solids and thin liquids with careful supervision. Throughout this admission she has had fluctuating mentation which may be one of the biggest risk factors for her in terms of aspiration. Note however that she may also be having intermittent episodes of emesis (husband had reported previously to this SLP an episode PTA, and in notes, SLP sees up to two possible episodes during hospitalization including on 10/17, although pt denies any N/V). If this is happening intermittently, this could also potentially be a post-prandial risk.   SLP also talked with husband, who arrived after this session, and updated him on findings. He is asking about if she could do purees, as she did well with them before in terms of finding foods she liked. I did share that I think she is capable  from an oropharyngeal standpoint of swallowing things a little more solid. Ultimately, we agreed that we would still proceed with Dys 3 (mechanical soft) foods and thin liquids, but if they have a preference for softer foods then they can always request a downgrade from nursing over the weekend. Will continue to follow.    HPI HPI: 81 yo female presents to [facility] on 10/6 with elevated liver enzymes, emesis. Pt recently diagnosed with UTI treated with nitrofurantoin. Ultrasound showing dilated gallbladder and CBD concerning for biliary ductal obstruction. CT of the abdomen and pelvis without IV contrast shows stranding around the pancreatic head concerning for possible pancreatic mass. s/p ERCP with stent placement on 10/8. Encephalopathic starting 10/10, suspected aspiration PNA in R lung. Pt with seizures on 10/14 with EEG in place 10/16. Pancreatic adenocarcinoma confirmed. MRI without acute findings. Pt with continued seizures and AMS requiring intubation 10/17-10/19. There was also concern for aspiration and N/V on 10/17. PMH includes HTN, HLD.      SLP Plan  Continue with current plan of care      Recommendations for follow up therapy are one component of a multi-disciplinary discharge planning process, led by the attending physician.  Recommendations may be updated based on patient status, additional functional criteria and insurance authorization.    Recommendations  Diet recommendations: Dysphagia 3 (mechanical soft);Thin liquid Liquids provided via: Cup;Straw Medication Administration: Whole meds with liquid Supervision: Patient able to self feed;Full supervision/cueing for compensatory strategies Compensations: Slow rate;Small sips/bites;Minimize environmental distractions Postural Changes and/or Swallow Maneuvers: Seated upright 90 degrees;Upright 30-60 min after meal  Oral Care Recommendations: Oral care BID Follow Up Recommendations: No SLP follow up Assistance  recommended at discharge: Frequent or constant Supervision/Assistance SLP Visit Diagnosis: Dysphagia, unspecified (R13.10) Plan: Continue with current plan of care           Osie Bond., M.A. [place] Office [phone]  Secure chat preferred   03/17/2022, 9:34 AM

## 2022-03-17 NOTE — Progress Notes (Addendum)
Occupational Therapy Treatment Patient Details Name: Shirley Nichols MRN: [id number] DOB: 1940-08-18 Today's Date: 03/17/2022   History of present illness 81 yo female presents to [facility] on 10/6 with elevated liver enzymes, emesis. Pt recently diagnosed with UTI treated with nitrofurantoin. Ultrasound showing dilated gallbladder and CBD concerning for biliary ductal obstruction. CT of the abdomen and pelvis without IV contrast shows stranding around the pancreatic head concerning for possible pancreatic mass. s/p ERCP with stent placement on 10/8. encephalopathic starting 10/10, suspected aspiration PNA in R lung. Pt with seizures on 10/14 with EEG in place 10/16. Pancreatic adenocarcinoma confirmed. Pt with continued seizures and intubated 10/17, extubated 10/19.  PMH includes HTN, HLD.   OT comments  Pt making gradual progress towards OT goals, able to stand and take sidesteps at bedside using RW with no more than Min A. Unable to progress further at this time due to EEG and pt reported dizziness with each standing attempts ([facility]). Noted R UE slightly swollen so assisted in elevating bed level to decrease edema and improve overall UE function. Based on further medical complications and recent extubation/intubation, pt has not been able to progress quickly for [facility] at this time. Recommend AIR prior to DC home to maximize independence and safety.   Recommendations for follow up therapy are one component of a multi-disciplinary discharge planning process, led by the attending physician.  Recommendations may be updated based on patient status, additional functional criteria and insurance authorization.    Follow Up Recommendations  Acute inpatient rehab (3hours/day)    Assistance Recommended at Discharge Frequent or constant Supervision/Assistance  Patient can return home with the following  Assistance with cooking/housework;Direct supervision/assist for medications management;Direct  supervision/assist for financial management;Assist for transportation;Help with stairs or ramp for entrance;A lot of help with bathing/dressing/bathroom;A lot of help with walking and/or transfers;Assistance with feeding   Equipment Recommendations  BSC/3in1    Recommendations for Other Services      Precautions / Restrictions Precautions Precautions: Fall;Other (comment) Precaution Comments: EEG monitor Restrictions Weight Bearing Restrictions: No       Mobility Bed Mobility Overal bed mobility: Needs Assistance Bed Mobility: Supine to Sit, Sit to Supine     Supine to sit: Min assist Sit to supine: Min assist   General bed mobility comments: No Assist to bring LE's off of bed, pt pulled up on OT to  elevate trunk into sitting, and incr time for pt to scoot hips to EOB. Assist to lower trunk and bring legs back up into the bed.    Transfers Overall transfer level: Needs assistance Equipment used: 2 person hand held assist Transfers: Sit to/from Stand Sit to Stand: Min guard, +2 safety/equipment, From elevated surface           General transfer comment: Pt able to stand with cues for hand placement.  Cues for taking side steps to [facility] with pt c/o dizziness and needed to sit x 1.  Pt closes eyes frequently.  No change in HR or BP with activity.     Balance Overall balance assessment: Needs assistance Sitting-balance support: No upper extremity supported, Feet supported, Bilateral upper extremity supported Sitting balance-Leahy Scale: Poor Sitting balance - Comments: relies on UE support to sit EOB initially and progressed to no UE support.   Standing balance support: Bilateral upper extremity supported, During functional activity Standing balance-Leahy Scale: Poor Standing balance comment: BUE support with min guard assist and RW.  Pt could only stand about 1 min each attempt due  to dizziness.                           ADL either performed or assessed with  clinical judgement   ADL Overall ADL's : Needs assistance/impaired                 Upper Body Dressing : Minimal assistance;Sitting Upper Body Dressing Details (indicate cue type and reason): assist to cue for sequencing, line mgmt                   General ADL Comments: Focus on trial of further standing though per RN, did not want to place pt in chair today with EEG on    Extremity/Trunk Assessment Upper Extremity Assessment Upper Extremity Assessment: Generalized weakness   Lower Extremity Assessment Lower Extremity Assessment: Defer to PT evaluation        Vision   Vision Assessment?: Vision impaired- to be further tested in functional context Additional Comments: difficulty scanning with prior OT session. able to turn and look side to side without much issue during today's session; to be further assessed   Perception     Praxis      Cognition Arousal/Alertness: Awake/alert Behavior During Therapy: Flat affect Overall Cognitive Status: Impaired/Different from baseline Area of Impairment: Orientation, Attention, Memory, Following commands, Safety/judgement, Awareness, Problem solving                 Orientation Level: Time, Situation Current Attention Level: Sustained Memory: Decreased short-term memory, Decreased recall of precautions Following Commands: Follows one step commands with increased time Safety/Judgement: Decreased awareness of safety, Decreased awareness of deficits Awareness: Emergent Problem Solving: Slow processing, Decreased initiation, Difficulty sequencing, Requires verbal cues, Requires tactile cues General Comments: Difficulty initiating getting up on EOB. Needed repetition of commands at times with some overall decreased insight/problem solving noted        Exercises      Shoulder Instructions       General Comments VSS    Pertinent Vitals/ Pain       Pain Assessment Pain Assessment: No/denies pain  Home Living                                           Prior Functioning/Environment              Frequency  Min 2X/week        Progress Toward Goals  OT Goals(current goals can now be found in the care plan section)  Progress towards OT goals: Progressing toward goals  Acute Rehab OT Goals Patient Stated Goal: hopeful to feel better soon, for dizziness to subside OT Goal Formulation: With patient/family Time For Goal Achievement: 03/23/22 Potential to Achieve Goals: Fair ADL Goals Pt Will Perform Grooming: with supervision;standing Pt Will Perform Lower Body Dressing: with supervision;sit to/from stand Pt Will Transfer to Toilet: with supervision;ambulating;bedside commode Pt Will Perform Toileting - Clothing Manipulation and hygiene: with supervision;sit to/from stand Additional ADL Goal #1: Pt will scan to locate 3/5 items during ADL routine with supervision. Additional ADL Goal #2: Pt will demonstrate emergent awarness during ADL routine.  Plan Frequency remains appropriate;Discharge plan needs to be updated    Co-evaluation    PT/OT/SLP Co-Evaluation/Treatment: Yes Reason for Co-Treatment: Complexity of the patient's impairments (multi-system involvement);For patient/therapist safety;To address functional/ADL transfers PT goals addressed during session: Mobility/safety  with mobility OT goals addressed during session: ADL's and self-care;Strengthening/ROM      AM-PAC OT "6 Clicks" Daily Activity     Outcome Measure   Help from another person eating meals?: A Little Help from another person taking care of personal grooming?: A Little Help from another person toileting, which includes using toliet, bedpan, or urinal?: A Lot Help from another person bathing (including washing, rinsing, drying)?: A Lot Help from another person to put on and taking off regular upper body clothing?: A Little Help from another person to put on and taking off regular lower body  clothing?: A Lot 6 Click Score: 15    End of Session Equipment Utilized During Treatment: Rolling walker (2 wheels);Gait belt;Oxygen  OT Visit Diagnosis: Other abnormalities of gait and mobility (R26.89);Other symptoms and signs involving cognitive function;Low vision, both eyes (H54.2)   Activity Tolerance Patient limited by fatigue   Patient Left in bed;with call bell/phone within reach;with bed alarm set;with family/visitor present   Nurse Communication          Time: 4270-6237 OT Time Calculation (min): 24 min  Charges: OT General Charges $OT Visit: 1 Visit OT Treatments $Therapeutic Activity: 8-22 mins  Malachy Chamber, OTR/L Acute Rehab Services Office: [phone]   Layla Maw 03/17/2022, 2:35 PM

## 2022-03-17 NOTE — Progress Notes (Signed)
NAME:  Shirley Nichols, MRN:  [id number], DOB:  Dec 23, 1940, LOS: 67 ADMISSION DATE:  03/03/2022, CONSULTATION DATE:  03/12/2022 REFERRING MD:  Meredith Mody, CHIEF COMPLAINT:  Seizure   History of Present Illness:  81 year old-woman admitted to [facility] 10/6 for elevated liver enzymes and AKI with ultrasound showing CBD obstruction and pancreatic mass confirmed on MRCP.  She was transferred to [facility] and underwent ERCP stent placement on 10/8 with biopsy confirming adenocarcinoma.  She had postprocedure pancreatitis and developed increasing confusion on 10/10 with delirium on 10/13, MRI was negative.  She had witnessed focal right-sided seizure with left gaze preference, head CT was negative for CVA.  She was given 8 mg of Ativan in divided doses, loaded with Keppra and transferred to the ICU for LTM EEG  Pertinent  Medical History  HTN, HLD, Tobacco use   Significant Hospital Events: Including procedures, antibiotic start and stop dates in addition to other pertinent events   10/6-transferred to [facility] 10/7 and 10/8-GI Dr. Benson Norway consulted ERCP stent placement 10/9-post ERCP pancreatitis with AKI, pain and discomfort placed on pain meds 10/10 pancreatitis resolved but increasing confusion 10/12 Seen by Optho--unlikely ocular issue realted to visual changes 10/13 Delirious--MRI brain no lesion of casue for visual issues. PCCM consulted, transferred to ICU for cEEG and further monitoring 10/14  more awake and responsive  10/15 more focal seizures, reconsult 10/17: Patient had seizure lie activity and after never came back to appropriately and required intubation  10/19: Patient extubated  Interim History / Subjective:  Overnight no events.  Patient is resting in bed upon my exam.  Patient states that she is "fair" today.  Patient has no concerns this morning.  She has no shortness of breath or chest pain.  Patient is resting with nasal cannula 1 L.  Of note, patient has not had bowel  movement since 03/12/2022.  Patient does report passing gas.  She denies any urge to go.  Objective   Blood pressure 136/65, pulse 86, temperature 97.7 F (36.5 C), temperature source Oral, resp. rate 16, weight 59.4 kg, SpO2 99 %. CVP:  [4 mmHg-8 mmHg] 4 mmHg      Intake/Output Summary (Last 24 hours) at 03/17/2022 0920 Last data filed at 03/17/2022 0645 Gross per 24 hour  Intake 647.95 ml  Output 5190 ml  Net -4542.05 ml   Filed Weights   03/17/22 0500  Weight: 59.4 kg    Examination:  General: Patient is resting comfortably in bed upon my exam Eyes: Pupils equal and reactive to light, able to track Head: Normocephalic, atraumatic  Neck: Normal range of motion Cardio: Regular rate and rhythm, no murmur, rubs, or gallops. Chest: No chest tenderness Pulmonary: Coarse breath sounds throughout Abdomen: Soft, nontender with normoactive bowel sounds Neuro: Alert and oriented x3.  Can follow commands.  Pupils equal and reactive.  Resolved Hospital Problem list   Post ERCP pancreatitis  Acute respiratory failure Assessment & Plan:  This is a 81 year old female with newly diagnosed pancreatic cancer who presented back to PCCM with seizure like activity again. Patient admitted to ICU for further treatment and work up of seizure like activity.   #Focal status epilepticus #Acute encephalopathy Patient does not seem to have any seizures at this time and has been without seizure for 48 hours now.  Unsure of true etiology, but could be due to frontal mesial cortex along with acute electrolyte derangements.  Patient's mentation is much better today.  She is alert and  oriented x3.  She is able to follow all commands.  Neurology is stating that they will hold Lyrica today, given that she has not had any more seizures and she did not get Lyrica yesterday given that the patient was n.p.o. We will consider transferring to floor today.  Neurology will like to continue EEG at this time for 1 more  day.  Neurology still following.  Electrolytes are still being managed.  CSF cytology showing no malignant cells identified. -LP unrevealing -Neurology following, appreciate recs -Continue Keppra 1051m BID -Dilantin 1053mBID -Frequent neuro checks  -Continue EEG  #Acute respiratory failure secondary to above, resolved Patient is now extubated.  Patient is able to protect her own airway.  No concern at this time. -Continue to monitor respiratory status -Patient is on 1 L nasal cannula, continue to wean as tolerated -Oxygenation goal greater than 92% -Continue Brovana nebs -Continue Pulmicort nebs  #Obstructive jaundice status post CBD stent #Pancreatic adenocarcinoma  #Post ERCP pancreatitis, resolved  Liver enzymes decreasing from yesterday with current AST 26.  ALT 69.  Alk phos 390.  T. bili 1.2.  GI will not be scoping as stent occlusion or migration has resolved -CTM -Numbers are downtrending -GI following, appreciate recs -Outpatient follow up for pancreatic adenocarcinoma -Surgery will see patient outpatient -Oncology following who states that neoadjuvant Chemotherapy would be beneficial  #Normocytic anemia Hemoglobin seems to have been downtrending over the past few days.  2 days ago it was 9.8.  Yesterday 8.5.  Today 7.9.  I do not believe this is true blood loss anemia.  This could be in the setting of acute illness.  We will continue to monitor. -Consider getting iron studies as well as B12 and folate. -Continue to monitor H&H  #Hypokalemia #Hypomagnesemia Potassium 3.0 today.  Mag 1.9 today.  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 42 today.  Patient output close to 5 L after Lasix yesterday.  Patient's creatinine is up today.  We will not give more Lasix.  Unsure if this is a true AKI versus baseline function. -Continue to monitor BMP -Monitor kidney  function  Best Practice (right click and "Reselect all SmartList Selections" daily)   Diet/type: Tube feeds DVT prophylaxis: Lovenox  GI prophylaxis: N/A Lines: right PICC line Foley:  Yes, and it is still needed Code Status: Partial code, no chest compressions or shocks, but yes to intubation.  Labs   CBC: Recent Labs  Lab 03/12/22 1837 03/13/22 0411 03/14/22 0445 03/14/22 1715 03/15/22 0118 03/16/22 0525 03/17/22 0312  WBC 10.5 9.8 12.6*  --  12.0* 10.5 8.4  NEUTROABS 8.2*  --   --   --   --   --   --   HGB 9.9* 8.7* 8.9* 8.5* 9.8* 8.5* 7.9*  HCT 30.3* 26.8* 26.4* 25.0* 28.6* 26.5* 24.7*  MCV 80.2 80.2 79.8*  --  79.7* 82.3 81.5  PLT 327 286 276  --  336 304 29300  Basic Metabolic Panel: Recent Labs  Lab 03/14/22 0445 03/14/22 0943 03/14/22 1715 03/15/22 0118 03/15/22 1622 03/16/22 0525 03/16/22 1638 03/17/22 0312  NA 135 136 134* 135  --  137  --  137  K 3.5 4.5 3.7 4.1  --  3.8  --  3.0*  CL 107 102  --  100  --  103  --  100  CO2 22 24  --  25  --  27  --  27  GLUCOSE 332* 146*  --  144*  --  120*  --  92  BUN 11 12  --  12  --  12  --  12  CREATININE 1.11* 1.31*  --  1.19*  --  1.37*  --  1.42*  CALCIUM 7.6* 8.5*  --  8.2*  --  8.2*  --  8.1*  MG 2.0  --   --  2.0 1.6* 1.5* 2.0 1.9  PHOS 3.3  --   --  4.6 3.5 3.6 3.6 4.2   GFR: Estimated Creatinine Clearance: 26.4 mL/min (A) (by C-G formula based on SCr of 1.42 mg/dL (H)). Recent Labs  Lab 03/14/22 0445 03/15/22 0118 03/16/22 0525 03/17/22 0312  WBC 12.6* 12.0* 10.5 8.4    Liver Function Tests: Recent Labs  Lab 03/14/22 0445 03/14/22 0943 03/15/22 0118 03/16/22 0525 03/17/22 0312  AST 194* 225* 146* 44* 26  ALT 138* 179* 174* 95* 69*  ALKPHOS 706* 874* 775* 529* 390*  BILITOT 1.9* 2.5* 2.5* 1.0 1.2  PROT 4.5* 5.6* 5.4* 4.8* 4.7*  ALBUMIN 1.7* 2.0* 2.0* 1.7* 1.7*   No results for input(s): "LIPASE", "AMYLASE" in the last 168 hours.  Recent Labs  Lab 03/14/22 1303  AMMONIA 24     ABG    Component Value Date/Time   PHART 7.408 03/14/2022 1715   PCO2ART 42.6 03/14/2022 1715   PO2ART 280 (H) 03/14/2022 1715   HCO3 26.9 03/14/2022 1715   TCO2 28 03/14/2022 1715   O2SAT 100 03/14/2022 1715     Coagulation Profile: Recent Labs  Lab 03/12/22 1837 03/13/22 1543 03/14/22 1303  INR 1.1 1.1 1.3*    Cardiac Enzymes: Recent Labs  Lab 03/13/22 0411  CKTOTAL 24*    HbA1C: Hgb A1c MFr Bld  Date/Time Value Ref Range Status  03/15/2022 01:18 AM 5.9 (H) 4.8 - 5.6 % Final    Comment:    (NOTE) Pre diabetes:          5.7%-6.4%  Diabetes:              >6.4%  Glycemic control for   <7.0% adults with diabetes     CBG: Recent Labs  Lab 03/16/22 1543 03/16/22 2004 03/16/22 2320 03/17/22 0331 03/17/22 0734  GLUCAP 101* 90 96 84 84    Review of Systems:   Negative except for what is state in HPI   Past Medical History:  She,  has a past medical history of Cancer ([place]), Hyperlipidemia, and Hypertension.   Surgical History:   Past Surgical History:  Procedure Laterality Date   ABDOMINAL HYSTERECTOMY     BILIARY BRUSHING  03/05/2022   Procedure: BILIARY BRUSHING;  Surgeon: Carol Ada, MD;  Location: [facility] ENDOSCOPY;  Service: Gastroenterology;;   BILIARY STENT PLACEMENT  03/05/2022   Procedure: BILIARY STENT PLACEMENT;  Surgeon: Carol Ada, MD;  Location: [facility] ENDOSCOPY;  Service: Gastroenterology;;   COLONOSCOPY     02/21/2002 adenomatous polyp, 03/23/2011   COLONOSCOPY W/ POLYPECTOMY  02/2000   TA polyp removed from ascending colon.  performed through [facility] (? affiliate in [place])   COLONOSCOPY WITH PROPOFOL N/A 11/03/2016   Procedure: COLONOSCOPY WITH PROPOFOL;  Surgeon: Manya Silvas, MD;  Location: [facility] ENDOSCOPY;  Service: Endoscopy;  Laterality: N/A;   ERCP N/A 03/05/2022   Procedure: ENDOSCOPIC RETROGRADE CHOLANGIOPANCREATOGRAPHY (ERCP);  Surgeon: Carol Ada, MD;  Location: [place];  Service: Gastroenterology;   Laterality: N/A;   HEMORROIDECTOMY     TONSILLECTOMY  Social History:   reports that she has quit smoking. She has never used smokeless tobacco. She reports that she does not drink alcohol and does not use drugs.   Family History:  Her family history is negative for Breast cancer.   Allergies No Known Allergies   Home Medications  Prior to Admission medications   Medication Sig Start Date End Date Taking? Authorizing Provider  SYMBICORT 160-4.5 MCG/ACT inhaler Inhale 2 puffs into the lungs as needed (for wheezing or shortness of breath). 02/05/16  Yes [provider]  aspirin EC 81 MG tablet Take 81 mg by mouth daily.    [provider]  ezetimibe (ZETIA) 10 MG tablet Take 10 mg by mouth daily. 12/12/21   [provider]  fluvastatin XL (LESCOL XL) 80 MG 24 hr tablet Take 80 mg by mouth daily. 02/19/16   [provider]  valsartan-hydrochlorothiazide (DIOVAN-HCT) 320-25 MG tablet Take 1 tablet by mouth daily. 01/28/16   [provider]     Critical care time: [place], DO Internal Medicine Resident PGY-1 Pager: [phone]

## 2022-03-17 NOTE — Progress Notes (Signed)
° °  Inpatient Rehab Admissions Coordinator : ° °Per therapy change in recommendations, patient was screened for CIR candidacy by Shakai Dolley RN MSN.  At this time patient appears to be a potential candidate for CIR. I will place a rehab consult per protocol for full assessment. Please call me with any questions. ° °Jacelyn Cuen RN MSN °Admissions Coordinator °[phone] °  °

## 2022-03-17 NOTE — Progress Notes (Signed)
[facility] ADULT ICU REPLACEMENT PROTOCOL   The patient does apply for the [facility] Adult ICU Electrolyte Replacment Protocol based on the criteria listed below:   1.Exclusion criteria: TCTS patients, ECMO patients, and Dialysis patients 2. Is GFR >/= 30 ml/min? Yes.    Patient's GFR today is 37 3. Is SCr </= 2? Yes.   Patient's SCr is 1.42 mg/dL 4. Did SCr increase >/= 0.5 in 24 hours? No. 5.Pt's weight >40kg  Yes.   6. Abnormal electrolyte(s): mag 1.9, K+ 3.0  7. Electrolytes replaced per protocol 8.  Call MD STAT for K+ </= 2.5, Phos </= 1, or Mag </= 1 Physician:  n/a  Shirley Nichols 03/17/2022 4:10 AM

## 2022-03-17 NOTE — Progress Notes (Signed)
Nutrition Follow-up  DOCUMENTATION CODES:   Non-severe (moderate) malnutrition in context of chronic illness  INTERVENTION:   - Ensure Enlive po TID, each supplement provides 350 kcal and 20 grams of protein  - MVI with minerals daily  - Encourage PO intake  NUTRITION DIAGNOSIS:   Moderate Malnutrition related to chronic illness (pancreatic cancer) as evidenced by mild fat depletion, moderate muscle depletion.  Ongoing, being addressed via diet advancement and oral nutrition supplements  GOAL:   Patient will meet greater than or equal to 90% of their needs  Progressing  MONITOR:   PO intake, Supplement acceptance, Labs, Weight trends, I & O's  REASON FOR ASSESSMENT:   Ventilator, Consult Enteral/tube feeding initiation and management  ASSESSMENT:   81 year old female who presented to the ED on 10/06 with elevated LFTs. PMH of HTN, HLD, tobacco abuse. Pt admitted with pancreatic head mass with obstructive jaundice.  10/08 - NPO, s/p ERCP with sphincterotomy and biliary stent placement and brush biopsy 10/09 - pt with post-ERCP acute pancreatitis, clear liquid diet 10/10 - heart healthy diet, pancreatitis resolved but increasing confusion 10/11 - suspected aspiration, dysphagia 1 diet with thin liquids 10/12 - regular diet 10/13 - NPO, seizures, transferred to ICU 10/14 - full liquid diet 10/15 - NPO, more seizures 10/16 - regular diet, s/p LP 10/17 - intubated 10/18 - TF started 10/19 - extubated 10/20 - diet advanced to dysphagia 3 with thin liquids  Discussed pt with RN and during ICU rounds. No family present at bedside at time of RD visit. Pt sleeping soundly and did not awaken to RD voice. Diet advanced to dysphagia 3 this morning. RD will order oral nutrition supplements to aid pt in meeting kcal and protein needs. Will also order daily MVI with minerals.  Medications reviewed and include: dulcolax suppository, SSI q 4 hours, miralax, klor-con 40 mEq x 2,  senna, IV dilantin  Labs reviewed: potassium 3.0, creatinine 1.42, hemoglobin 7.9 CBG's: 84-140 x 24 hours  UOP: 5190 ml x 24 hours I/O's: +11.6 L since admit  Diet Order:   Diet Order             DIET DYS 3 Room service appropriate? No; Fluid consistency: Thin  Diet effective now                   EDUCATION NEEDS:   No education needs have been identified at this time  Skin:  Skin Assessment: Reviewed RN Assessment  Last BM:  03/12/22  Height:   Ht Readings from Last 1 Encounters:  03/03/22 '5\' 4"'$  (1.626 m)    Weight:   Wt Readings from Last 1 Encounters:  03/17/22 59.4 kg    BMI:  Body mass index is 22.48 kg/m.  Estimated Nutritional Needs:   Kcal:  1600-1800  Protein:  80-95 grams  Fluid:  1.6-1.8 L    Gustavus Bryant, MS, RD, LDN Inpatient Clinical Dietitian Please see AMiON for contact information.

## 2022-03-17 NOTE — Procedures (Addendum)
Patient Name: Shirley Nichols  MRN: [id number]  Epilepsy Attending: Lora Havens  Referring Physician/Provider: Katy Apo, NP  Duration: 03/16/2022 0920 to 03/17/2022 0920   Patient history: 81yo F with right leg tremoring after the MRI and they also noted that she was talking gibberish all afternoon for 4 to 5 hours. At approximately 1830, she was noted to have right arm and leg jerking with left gaze deviation. EEG to evaluate for seizure   Level of alertness: lethargic   AEDs during EEG study: LEV, PHT, Propofol   Technical aspects: This EEG study was done with scalp electrodes positioned according to the 10-20 International system of electrode placement. Electrical activity was reviewed with band pass filter of 1-'70Hz'$ , sensitivity of 7 uV/mm, display speed of 88m/sec with a '60Hz'$  notched filter applied as appropriate. EEG data were recorded continuously and digitally stored.  Video monitoring was available and reviewed as appropriate.   Description: EEG showed continuous generalized 3 to 6 Hz theta-delta slowing. Hyperventilation and photic stimulation were not performed.      ABNORMALITY - Continuous slow, generalized    IMPRESSION: This study is suggestive of moderate to severe diffuse encephalopathy, nonspecific etiology. No epileptiform discharges were seen throughout the recording.   Deaken Jurgens OBarbra Sarks

## 2022-03-17 NOTE — Progress Notes (Signed)
LFTs continue to trend downward.  Total bili is now normal.  It appears that her stent is working so no plans for repeat ERCP.  Suspect a component of this rise in LFTs was medication related.  Please see note from 03/16/2022.  GI signing off for now.  Please call us back if needed.

## 2022-03-18 DIAGNOSIS — R7989 Other specified abnormal findings of blood chemistry: Secondary | ICD-10-CM | POA: Diagnosis not present

## 2022-03-18 DIAGNOSIS — R569 Unspecified convulsions: Secondary | ICD-10-CM | POA: Diagnosis not present

## 2022-03-18 DIAGNOSIS — J9601 Acute respiratory failure with hypoxia: Secondary | ICD-10-CM | POA: Diagnosis not present

## 2022-03-18 LAB — CBC WITH DIFFERENTIAL/PLATELET
Abs Immature Granulocytes: 0.03 10*3/uL (ref 0.00–0.07)
Basophils Absolute: 0.1 10*3/uL (ref 0.0–0.1)
Basophils Relative: 1 %
Eosinophils Absolute: 0.3 10*3/uL (ref 0.0–0.5)
Eosinophils Relative: 3 %
HCT: 24.6 % — ABNORMAL LOW (ref 36.0–46.0)
Hemoglobin: 8.1 g/dL — ABNORMAL LOW (ref 12.0–15.0)
Immature Granulocytes: 0 %
Lymphocytes Relative: 14 %
Lymphs Abs: 1.1 10*3/uL (ref 0.7–4.0)
MCH: 26.5 pg (ref 26.0–34.0)
MCHC: 32.9 g/dL (ref 30.0–36.0)
MCV: 80.4 fL (ref 80.0–100.0)
Monocytes Absolute: 0.5 10*3/uL (ref 0.1–1.0)
Monocytes Relative: 7 %
Neutro Abs: 5.9 10*3/uL (ref 1.7–7.7)
Neutrophils Relative %: 75 %
Platelets: 317 10*3/uL (ref 150–400)
RBC: 3.06 MIL/uL — ABNORMAL LOW (ref 3.87–5.11)
RDW: 15.5 % (ref 11.5–15.5)
WBC: 7.9 10*3/uL (ref 4.0–10.5)
nRBC: 0 % (ref 0.0–0.2)

## 2022-03-18 LAB — ANAEROBIC CULTURE W GRAM STAIN: Gram Stain: NONE SEEN

## 2022-03-18 LAB — GLUCOSE, CAPILLARY
Glucose-Capillary: 114 mg/dL — ABNORMAL HIGH (ref 70–99)
Glucose-Capillary: 118 mg/dL — ABNORMAL HIGH (ref 70–99)
Glucose-Capillary: 149 mg/dL — ABNORMAL HIGH (ref 70–99)
Glucose-Capillary: 91 mg/dL (ref 70–99)
Glucose-Capillary: 91 mg/dL (ref 70–99)
Glucose-Capillary: 93 mg/dL (ref 70–99)
Glucose-Capillary: 95 mg/dL (ref 70–99)

## 2022-03-18 LAB — COMPREHENSIVE METABOLIC PANEL
ALT: 58 U/L — ABNORMAL HIGH (ref 0–44)
AST: 28 U/L (ref 15–41)
Albumin: 1.7 g/dL — ABNORMAL LOW (ref 3.5–5.0)
Alkaline Phosphatase: 336 U/L — ABNORMAL HIGH (ref 38–126)
Anion gap: 9 (ref 5–15)
BUN: 14 mg/dL (ref 8–23)
CO2: 27 mmol/L (ref 22–32)
Calcium: 8.1 mg/dL — ABNORMAL LOW (ref 8.9–10.3)
Chloride: 102 mmol/L (ref 98–111)
Creatinine, Ser: 1.32 mg/dL — ABNORMAL HIGH (ref 0.44–1.00)
GFR, Estimated: 40 mL/min — ABNORMAL LOW (ref >60)
Glucose, Bld: 90 mg/dL (ref 70–99)
Potassium: 3.5 mmol/L (ref 3.5–5.1)
Sodium: 138 mmol/L (ref 135–145)
Total Bilirubin: 0.9 mg/dL (ref 0.3–1.2)
Total Protein: 4.7 g/dL — ABNORMAL LOW (ref 6.5–8.1)

## 2022-03-18 LAB — TRIGLYCERIDES: Triglycerides: 97 mg/dL (ref <150)

## 2022-03-18 MED ORDER — PHENYTOIN SODIUM EXTENDED 100 MG PO CAPS
300.0000 mg | ORAL_CAPSULE | Freq: Every day | ORAL | Status: DC
  Administered 2022-03-18 – 2022-03-22 (×5): 300 mg via ORAL
  Filled 2022-03-18 (×6): qty 3

## 2022-03-18 MED ORDER — LEVETIRACETAM 750 MG PO TABS
750.0000 mg | ORAL_TABLET | Freq: Two times a day (BID) | ORAL | Status: DC
  Administered 2022-03-18 – 2022-03-23 (×10): 750 mg via ORAL
  Filled 2022-03-18 (×11): qty 1

## 2022-03-18 NOTE — Progress Notes (Signed)
  Progress Note   Patient: Shirley Nichols UMP:[id number] DOB: 1940/12/18 DOA: 03/03/2022     15 DOS: the patient was seen and examined on 03/18/2022   Brief hospital course: 81 y.o. F with HTN, HLD who presented with hyperbilirubinemia and renal failure.  Developed generalized weakness in the last few weeks, went to PCP, found to have jaundice, elevated LFTs and AKI, sent to the ER.   10/6: Admitted, MRCP shows masslike density in the head of pancreas with biliary obstruction 10/7: GI consulted 10/8: ERCP with stent placement 10/9: New post-ERCP pancreatitis, Cr back up 10/10: Pancreatitis resolved, Cr improving on fulids  On 10/13, found to have delirum and unresponsiveness, transferred to ICU. While in ICU, had seizures, requiring intubation. Neurology followed for EEG. Pt exutabated 10/19  Assessment and Plan: Acute toxic metabolic encephalopathy with Status epilepticus -Discussed with Neurology. Continued on keppra '750mg'$  bid with dilantin '300mg'$  qhs -Per Neurology, pt has not had any definite episodes concerning for seizures since episode that caused her to be intubated -Rehab recommended. Family reports they would prefer pt closer to [facility] if possible. CIR is following. 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 Appreciate Oncology input on 10/12. Recs for chemotherapy as outpatient  Normocytic anemia -hemodynamically stable -recheck cbc in AM  ARF -Cr stable at 1.32 -Recheck bmet in AM     Subjective: Without complaints  Physical Exam: Vitals:   03/17/22 2000 03/17/22 2024 03/18/22 0000 03/18/22 0426  BP: (!) 141/57  (!) 134/51 (!) 145/68  Pulse: 87  78 78  Resp: '15  13 14  '$ Temp:   97.7 F (36.5  C) 98 F (36.7 C)  TempSrc:   Axillary Oral  SpO2: 96% 99% 98% 98%  Weight:    69.7 kg  Height:       General exam: Awake, laying in bed, in nad Respiratory system: Normal respiratory effort, no wheezing Cardiovascular system: regular rate, s1, s2 Gastrointestinal system: Soft, nondistended, positive BS Central nervous system: CN2-12 grossly intact, strength intact Extremities: Perfused, no clubbing Skin: Normal skin turgor, no notable skin lesions seen Psychiatry: Mood normal // no visual hallucinations   Data Reviewed:  Labs reviewed: Na 138, K 3.5, Cr 1.32  Family Communication: Pt in room, family at bedside  Disposition: Status is: Inpatient Remains inpatient appropriate because: Severity of illness  Planned Discharge Destination: Home    Author: Marylu Lund, MD 03/18/2022 6:11 PM  For on call review [email or web address].

## 2022-03-18 NOTE — Progress Notes (Signed)
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  The EEG is negative, but based on semiology I suspect a mesial frontal focus, hence leg predominance. With a deep focus, surface EEG can be negative.   Following cessation of EEG, she had a recurrent episode which involved not only shaking but also mental status changes and eye movement changes requiring intubation.  Given this, I still suspect that the right leg jerks were focal seizures not well seen on EEG.   She has not had any definite episodes concerning for seizures since the episode that caused her to be intubated.  We can change her to oral antiepileptics at this time.   Recommendations: 1) continue Keppra 750 twice daily (changed to oral) 2) continue dilantin 300 mg nightly 3) Will continue to follow.    Roland Rack, MD [facility] [phone]  If 7pm- 7am, please page neurology on call as listed in [place].

## 2022-03-18 NOTE — Progress Notes (Addendum)
LTM maint complete -  skin breakdown under:  Fp2 redness under Fp1 , nurse has been notified. [facility] notify to discontinued monitoring

## 2022-03-18 NOTE — Progress Notes (Signed)
LTM maint complete - no skin breakdown  Serviced F7 F8 O1 C3 Cz F3 [facility] monitored, Event button test confirmed by [facility].

## 2022-03-18 NOTE — Procedures (Signed)
Patient Name: Shirley Nichols  MRN: [id number]  Epilepsy Attending: Lora Havens  Referring Physician/Provider: Katy Apo, NP  Duration: 03/17/2022 0920 to 03/18/2022 0920   Patient history: 81yo F with right leg tremoring after the MRI and they also noted that she was talking gibberish all afternoon for 4 to 5 hours. At approximately 1830, she was noted to have right arm and leg jerking with left gaze deviation. EEG to evaluate for seizure   Level of alertness: awake, asleep   AEDs during EEG study: LEV, PHT   Technical aspects: This EEG study was done with scalp electrodes positioned according to the 10-20 International system of electrode placement. Electrical activity was reviewed with band pass filter of 1-'70Hz'$ , sensitivity of 7 uV/mm, display speed of 63m/sec with a '60Hz'$  notched filter applied as appropriate. EEG data were recorded continuously and digitally stored.  Video monitoring was available and reviewed as appropriate.   Description: Eno clear posterior dominant rhythm was seen. Sleep was characterized by sleep spindles (12-'14hz'$ ) maximal fronto-central region. EEG also showed continuous generalized predominantly 5-'9hz'$  theta-alpha activity admixed with intermittent 2-'3Hz'$  delta slowing. slowing. Hyperventilation and photic stimulation were not performed.      ABNORMALITY - Continuous slow, generalized    IMPRESSION: This study is suggestive of moderate diffuse encephalopathy, nonspecific etiology. No epileptiform discharges were seen throughout the recording.   Zayvion Stailey OBarbra Sarks

## 2022-03-19 DIAGNOSIS — J9601 Acute respiratory failure with hypoxia: Secondary | ICD-10-CM | POA: Diagnosis not present

## 2022-03-19 DIAGNOSIS — R569 Unspecified convulsions: Secondary | ICD-10-CM | POA: Diagnosis not present

## 2022-03-19 DIAGNOSIS — R7989 Other specified abnormal findings of blood chemistry: Secondary | ICD-10-CM | POA: Diagnosis not present

## 2022-03-19 DIAGNOSIS — G9340 Encephalopathy, unspecified: Secondary | ICD-10-CM | POA: Diagnosis not present

## 2022-03-19 LAB — COMPREHENSIVE METABOLIC PANEL
ALT: 46 U/L — ABNORMAL HIGH (ref 0–44)
AST: 25 U/L (ref 15–41)
Albumin: 1.9 g/dL — ABNORMAL LOW (ref 3.5–5.0)
Alkaline Phosphatase: 301 U/L — ABNORMAL HIGH (ref 38–126)
Anion gap: 10 (ref 5–15)
BUN: 10 mg/dL (ref 8–23)
CO2: 29 mmol/L (ref 22–32)
Calcium: 8.2 mg/dL — ABNORMAL LOW (ref 8.9–10.3)
Chloride: 99 mmol/L (ref 98–111)
Creatinine, Ser: 1.13 mg/dL — ABNORMAL HIGH (ref 0.44–1.00)
GFR, Estimated: 49 mL/min — ABNORMAL LOW (ref >60)
Glucose, Bld: 84 mg/dL (ref 70–99)
Potassium: 2.8 mmol/L — ABNORMAL LOW (ref 3.5–5.1)
Sodium: 138 mmol/L (ref 135–145)
Total Bilirubin: 1 mg/dL (ref 0.3–1.2)
Total Protein: 4.9 g/dL — ABNORMAL LOW (ref 6.5–8.1)

## 2022-03-19 LAB — CBC
HCT: 26.3 % — ABNORMAL LOW (ref 36.0–46.0)
Hemoglobin: 8.5 g/dL — ABNORMAL LOW (ref 12.0–15.0)
MCH: 26.6 pg (ref 26.0–34.0)
MCHC: 32.3 g/dL (ref 30.0–36.0)
MCV: 82.2 fL (ref 80.0–100.0)
Platelets: 343 10*3/uL (ref 150–400)
RBC: 3.2 MIL/uL — ABNORMAL LOW (ref 3.87–5.11)
RDW: 15.1 % (ref 11.5–15.5)
WBC: 7.5 10*3/uL (ref 4.0–10.5)
nRBC: 0 % (ref 0.0–0.2)

## 2022-03-19 LAB — PHENYTOIN LEVEL, TOTAL: Phenytoin Lvl: 10 ug/mL (ref 10.0–20.0)

## 2022-03-19 LAB — MAGNESIUM: Magnesium: 1.4 mg/dL — ABNORMAL LOW (ref 1.7–2.4)

## 2022-03-19 LAB — GLUCOSE, CAPILLARY
Glucose-Capillary: 91 mg/dL (ref 70–99)
Glucose-Capillary: 95 mg/dL (ref 70–99)
Glucose-Capillary: 199 mg/dL — ABNORMAL HIGH (ref 70–99)
Glucose-Capillary: 145 mg/dL — ABNORMAL HIGH (ref 70–99)

## 2022-03-19 MED ORDER — MAGNESIUM SULFATE 4 GM/100ML IV SOLN
4.0000 g | Freq: Once | INTRAVENOUS | Status: AC
  Administered 2022-03-19: 4 g via INTRAVENOUS
  Filled 2022-03-19: qty 100

## 2022-03-19 MED ORDER — ENOXAPARIN SODIUM 40 MG/0.4ML IJ SOSY
40.0000 mg | PREFILLED_SYRINGE | INTRAMUSCULAR | Status: DC
  Administered 2022-03-19 – 2022-03-22 (×4): 40 mg via SUBCUTANEOUS
  Filled 2022-03-19 (×4): qty 0.4

## 2022-03-19 MED ORDER — POTASSIUM CHLORIDE CRYS ER 20 MEQ PO TBCR
60.0000 meq | EXTENDED_RELEASE_TABLET | ORAL | Status: AC
  Administered 2022-03-19 (×2): 60 meq via ORAL
  Filled 2022-03-19 (×2): qty 3

## 2022-03-19 NOTE — Progress Notes (Signed)
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  The EEG is negative, but based on semiology I suspect a mesial frontal focus, hence leg predominance. With a deep focus, surface EEG can be negative.   Following cessation of EEG, she had a recurrent episode which involved not only shaking but also mental status changes and eye movement changes requiring intubation.  Given this, I still suspect that the right leg jerks were focal seizures not well seen on EEG.   She has not had any definite episodes concerning for seizures since the episode that caused her to be intubated.    Recommendations: 1) continue Keppra 750 twice daily  2) continue dilantin 300 mg nightly 3) no further recommendations from a neurology perspective at this time, please call with further questions or concerns.   Roland Rack, MD [facility] [phone]  If 7pm- 7am, please page neurology on call as listed in [place].

## 2022-03-19 NOTE — Procedures (Signed)
Patient Name: Shirley Nichols  MRN: [id number]  Epilepsy Attending: Lora Havens  Referring Physician/Provider: Katy Apo, NP  Duration: 03/18/2022 0920 to 03/18/2022 1950   Patient history: 81yo F with right leg tremoring after the MRI and they also noted that she was talking gibberish all afternoon for 4 to 5 hours. At approximately 1830, she was noted to have right arm and leg jerking with left gaze deviation. EEG to evaluate for seizure   Level of alertness: awake, asleep   AEDs during EEG study: LEV, PHT   Technical aspects: This EEG study was done with scalp electrodes positioned according to the 10-20 International system of electrode placement. Electrical activity was reviewed with band pass filter of 1-'70Hz'$ , sensitivity of 7 uV/mm, display speed of 68m/sec with a '60Hz'$  notched filter applied as appropriate. EEG data were recorded continuously and digitally stored.  Video monitoring was available and reviewed as appropriate.   Description: Eno clear posterior dominant rhythm was seen. Sleep was characterized by sleep spindles (12-'14hz'$ ) maximal fronto-central region. EEG also showed continuous generalized predominantly 5-'9hz'$  theta-alpha activity admixed with intermittent 2-'3Hz'$  delta slowing. slowing. Hyperventilation and photic stimulation were not performed.      ABNORMALITY - Continuous slow, generalized    IMPRESSION: This study is suggestive of moderate diffuse encephalopathy, nonspecific etiology. No epileptiform discharges were seen throughout the recording.   Tiffiany Beadles OBarbra Sarks

## 2022-03-19 NOTE — Progress Notes (Signed)
  Progress Note   Patient: Shirley Nichols DXA:[id number] DOB: 01/11/1941 DOA: 03/03/2022     16 DOS: the patient was seen and examined on 03/19/2022   Brief hospital course: 81 y.o. F with HTN, HLD who presented with hyperbilirubinemia and renal failure.  Developed generalized weakness in the last few weeks, went to PCP, found to have jaundice, elevated LFTs and AKI, sent to the ER.   10/6: Admitted, MRCP shows masslike density in the head of pancreas with biliary obstruction 10/7: GI consulted 10/8: ERCP with stent placement 10/9: New post-ERCP pancreatitis, Cr back up 10/10: Pancreatitis resolved, Cr improving on fulids  On 10/13, found to have delirum and unresponsiveness, transferred to ICU. While in ICU, had seizures, requiring intubation. Neurology followed for EEG. Pt exutabated 10/19  Assessment and Plan: Acute toxic metabolic encephalopathy with Status epilepticus -Discussed with Neurology. Continued on keppra '750mg'$  bid with dilantin '300mg'$  qhs -Per Neurology, pt has not had any definite episodes concerning for seizures since episode that caused her to be intubated -Rehab recommended. Family reports they would prefer pt closer to [facility] if possible. CIR is following. TOC consult placed  Acute resp failure -Did require intubation in ICU, now exutbated -On minmal O2 support  Obstructive jaundice secondary to pancreatic adenocarcinoma s/p ERCP -GI had been following, now s/p stenting to CBD -General Surgery noted to f/u pt as outpatient -Seen by Oncology this visit. Appreciate Oncology input on 10/12. Recs for chemotherapy as outpatient.  -As pt and family reside in the [place] area, family prefers to establish at [facility]  Normocytic anemia -hemodynamically stable -Recheck cbc in AM  ARF -Cr stable at 1.32 -Recheck bmet in AM  Hypokalemia -will replace -recheck bmet in AM     Subjective: Without complaints  Physical Exam: Vitals:   03/19/22  0800 03/19/22 0847 03/19/22 1200 03/19/22 1600  BP: (!) 139/112 (!) 160/97 (!) 167/66 (!) 154/72  Pulse: 81 81 70 89  Resp: (!) '27 16 15 19  '$ Temp:  98 F (36.7 C)    TempSrc:  Oral    SpO2: 99% 94% 99% 96%  Weight:      Height:       General exam: Asleep, in no acute distress Respiratory system: normal chest rise, clear, no audible wheezing Cardiovascular system: regular rhythm, s1-s2 Gastrointestinal system: Nondistended, nontender, pos BS Central nervous system: No seizures, no tremors Extremities: No cyanosis, no joint deformities Skin: No rashes, no pallor Psychiatry: Difficult to assess given mentation  Data Reviewed:  Labs reviewed: Na 138, K 2.8, Cr 1.13  Family Communication: Pt in room, family at bedside  Disposition: Status is: Inpatient Remains inpatient appropriate because: Severity of illness  Planned Discharge Destination: Home    Author: Marylu Lund, MD 03/19/2022 4:49 PM  For on call review [email or web address].

## 2022-03-20 DIAGNOSIS — R7989 Other specified abnormal findings of blood chemistry: Secondary | ICD-10-CM | POA: Diagnosis not present

## 2022-03-20 DIAGNOSIS — G9340 Encephalopathy, unspecified: Secondary | ICD-10-CM | POA: Diagnosis not present

## 2022-03-20 DIAGNOSIS — J9601 Acute respiratory failure with hypoxia: Secondary | ICD-10-CM | POA: Diagnosis not present

## 2022-03-20 LAB — COMPREHENSIVE METABOLIC PANEL
ALT: 42 U/L (ref 0–44)
AST: 23 U/L (ref 15–41)
Albumin: 1.9 g/dL — ABNORMAL LOW (ref 3.5–5.0)
Alkaline Phosphatase: 289 U/L — ABNORMAL HIGH (ref 38–126)
Anion gap: 10 (ref 5–15)
BUN: 15 mg/dL (ref 8–23)
CO2: 28 mmol/L (ref 22–32)
Calcium: 7.9 mg/dL — ABNORMAL LOW (ref 8.9–10.3)
Chloride: 99 mmol/L (ref 98–111)
Creatinine, Ser: 1.13 mg/dL — ABNORMAL HIGH (ref 0.44–1.00)
GFR, Estimated: 49 mL/min — ABNORMAL LOW (ref >60)
Glucose, Bld: 118 mg/dL — ABNORMAL HIGH (ref 70–99)
Potassium: 3.8 mmol/L (ref 3.5–5.1)
Sodium: 137 mmol/L (ref 135–145)
Total Bilirubin: 0.7 mg/dL (ref 0.3–1.2)
Total Protein: 5.1 g/dL — ABNORMAL LOW (ref 6.5–8.1)

## 2022-03-20 LAB — CBC
HCT: 27.3 % — ABNORMAL LOW (ref 36.0–46.0)
Hemoglobin: 8.6 g/dL — ABNORMAL LOW (ref 12.0–15.0)
MCH: 26.2 pg (ref 26.0–34.0)
MCHC: 31.5 g/dL (ref 30.0–36.0)
MCV: 83.2 fL (ref 80.0–100.0)
Platelets: 353 10*3/uL (ref 150–400)
RBC: 3.28 MIL/uL — ABNORMAL LOW (ref 3.87–5.11)
RDW: 15.3 % (ref 11.5–15.5)
WBC: 7.4 10*3/uL (ref 4.0–10.5)
nRBC: 0 % (ref 0.0–0.2)

## 2022-03-20 LAB — GLUCOSE, CAPILLARY
Glucose-Capillary: 104 mg/dL — ABNORMAL HIGH (ref 70–99)
Glucose-Capillary: 111 mg/dL — ABNORMAL HIGH (ref 70–99)
Glucose-Capillary: 114 mg/dL — ABNORMAL HIGH (ref 70–99)
Glucose-Capillary: 123 mg/dL — ABNORMAL HIGH (ref 70–99)
Glucose-Capillary: 146 mg/dL — ABNORMAL HIGH (ref 70–99)
Glucose-Capillary: 164 mg/dL — ABNORMAL HIGH (ref 70–99)

## 2022-03-20 LAB — MAGNESIUM: Magnesium: 1.9 mg/dL (ref 1.7–2.4)

## 2022-03-20 NOTE — PMR Pre-admission (Signed)
PMR Admission Coordinator Pre-Admission Assessment  Patient: Shirley Nichols is an 81 y.o., female MRN: [id number] DOB: 07-26-1940 Height: 5' 4" (162.6 cm) Weight: 84.8 kg  Insurance Information HMO:     PPO: Yes     PCP:      IPA:      80/20:      OTHER:  PRIMARY: [employer]      Policy#: [id number]      Subscriber: patient CM Name:       Phone#: [phone]  Opt 3   Fax#: [phone] Pre-Cert#: [id number] approved 03/21/22 for initial 7 days      Employer: Retired [employer] Date: 02/26/2022-05/28/22     Deduct: $0      Out of Pocket Max: $3,200 ($200 met)       CIR: $295/day co-pay for days 1-6, $0/day days7-90      SNF: $0/day co pay for days 1-20, $184/day co pay for days 21-100, limited to 100 days/benefit period Outpatient: $15/visit copay, limited by medical nec.     Home Health: 100% coverage       DME: 80% coverage, 20% coinsurance       The "Data Collection Information Summary" for patients in Inpatient Rehabilitation Facilities with attached "Privacy Act [place] Records" was provided and verbally reviewed with: Patient and Family  Emergency Contact Information Contact Information     Name Relation Home Work Mobile   [place] Spouse [phone]  [phone]   Deanie, [place] Daughter [phone]         Current Medical History  Patient Admitting Diagnosis: Pancreatic Ca, seizures  History of Present Illness: Shirley Nichols is a 81 year old right-handed female with history of hypertension, hyperlipidemia as well as history of tobacco use.  Per chart review patient lives with spouse.  1 level home with a few steps to entry.  Independent and active prior to admission.  Presented 03/03/2022 to the emergency department [facility] for evaluation of elevated LFTs on outpatient blood work.  By report patient had seen her PCP in August 2023 due to generalized weakness blood work was obtained patient notified of elevated LFTs.  Blood work noted BUN 57 creatinine  2.46 alkaline phosphatase 739 AST 188 ALT 229 total bilirubin 5.1 and lipase 342.  MRCP revealed 3.4 cm masslike density in the pancreatic head suspicious for pancreatic carcinoma with distal CBD stricture and diffuse biliary ductal dilatation.  GI services Dr. Vicente Males was consulted by the ED physician and patient was transferred to [facility] for further evaluation.  Underwent ERCP 03/05/2022 per Dr. Benson Norway that showed the upper third of the main bile duct and middle third of the main bile duct were dilated, with a mass causing an obstruction and underwent sphincterotomy and biliary stent placement with brush biopsy.  Hospital course complicated by acute respiratory failure requiring short-term intubation.  Bouts of confusion altered mental status as well as shaking with right lower extremity jerking and CT/MRI showed no metastatic disease or acute intracranial abnormality.  LP completed that was unrevealing.  EEG negative for seizure and patient did remain on Keppra as well as Dilantin..  She did receive consult by ophthalmology for blurry vision OU with work-up unremarkable felt to be related to toxic metabolic encephalopathy.  LFTs continue to trend downward.  Total bilirubin has normalized.  No current plans for repeat of ERCP.  Follow-up of AKI question CKD maintain on gentle IV fluids with latest creatinine 1.07.  Oncology services Dr. Alvy Bimler consulted for suspect pancreatic cancer and  CT of the chest completed 03/11/2022 showing a 1.8 cm hypervascular subcapsular lesion in the dome of the liver that was not visible on MRI 03/03/2022 felt to be related to perfusion anomaly given the history of suspect pancreatic cancer recommend ongoing follow-up.  Incidental finding of 2.1 cm right thyroid nodule as well as stable left adrenal nodule and current plan is to address chemotherapy as an outpatient.  Palliative care has been consulted to establish goals of care.  Lovenox initiated for DVT prophylaxis  03/19/2022.  Therapy evaluations completed due to patient decreased functional mobility.  Patient to be admitted for a comprehensive inpatient rehab program.   Complete NIHSS TOTAL: 0  Patient's medical record from [facility] has been reviewed by the rehabilitation admission coordinator and physician.  Past Medical History  Past Medical History:  Diagnosis Date   Cancer ([place])    skin cancer   Hyperlipidemia    Hypertension     Has the patient had major surgery during 100 days prior to admission? Yes  Family History   family history is not on file.  Current Medications  Current Facility-Administered Medications:    0.9 % NaCl with KCl 40 mEq / L  infusion, , Intravenous, Continuous, Samtani, Jai-Gurmukh, MD, Last Rate: 75 mL/hr at 03/23/22 1018, New Bag at 03/23/22 1018   acetaminophen (TYLENOL) tablet 650 mg, 650 mg, Oral, Q6H PRN, Howerter, Justin B, DO, 650 mg at 03/14/22 0558   arformoterol (BROVANA) nebulizer solution 15 mcg, 15 mcg, Nebulization, BID, Mick Sell, PA-C, 15 mcg at 03/23/22 3500   bisacodyl (DULCOLAX) suppository 10 mg, 10 mg, Rectal, Once, Leigh Aurora, DO   budesonide (PULMICORT) nebulizer solution 0.25 mg, 0.25 mg, Nebulization, BID, Payne, John D, PA-C, 0.25 mg at 03/23/22 0908   Chlorhexidine Gluconate Cloth 2 % PADS 6 each, 6 each, Topical, Daily, Chand, Sudham, MD, 6 each at 03/23/22 0525   enoxaparin (LOVENOX) injection 40 mg, 40 mg, Subcutaneous, Q24H, Donne Hazel, MD, 40 mg at 03/22/22 2154   feeding supplement (ENSURE ENLIVE / ENSURE PLUS) liquid 237 mL, 237 mL, Oral, TID BM, Chand, Sudham, MD, 237 mL at 03/22/22 2152   hydrALAZINE (APRESOLINE) injection 10-40 mg, 10-40 mg, Intravenous, Q4H PRN, Mick Sell, PA-C, 20 mg at 03/14/22 2134   insulin aspart (novoLOG) injection 0-9 Units, 0-9 Units, Subcutaneous, Q4H, Anders Simmonds, MD, 2 Units at 03/23/22 0842   levETIRAcetam (KEPPRA) tablet 750 mg, 750 mg, Oral, BID, Greta Doom, MD,  750 mg at 03/23/22 0838   LORazepam (ATIVAN) injection 2 mg, 2 mg, Intravenous, Q4H PRN, Donnetta Simpers, MD, 2 mg at 03/11/22 2357   ondansetron (ZOFRAN) tablet 4 mg, 4 mg, Oral, Q6H PRN **OR** ondansetron (ZOFRAN) injection 4 mg, 4 mg, Intravenous, Q6H PRN, Opyd, Ilene Qua, MD, 4 mg at 03/14/22 1322   Oral care mouth rinse, 15 mL, Mouth Rinse, 4 times per day, Jacky Kindle, MD, 15 mL at 03/23/22 1019   oxyCODONE (Oxy IR/ROXICODONE) immediate release tablet 2.5 mg, 2.5 mg, Oral, Q8H PRN, Leigh Aurora, DO, 2.5 mg at 03/18/22 2203   phenytoin (DILANTIN) ER capsule 300 mg, 300 mg, Oral, QHS, Greta Doom, MD, 300 mg at 03/22/22 2156   polyethylene glycol (MIRALAX / GLYCOLAX) packet 17 g, 17 g, Per Tube, Daily, Chand, Currie Paris, MD, 17 g at 03/23/22 0838   senna-docusate (Senokot-S) tablet 1 tablet, 1 tablet, Oral, BID, Jacky Kindle, MD, 1 tablet at 03/23/22 0838   sodium chloride flush (NS) 0.9 %  injection 10-40 mL, 10-40 mL, Intracatheter, Q12H, Candee Furbish, MD, 10 mL at 03/23/22 1020   sodium chloride flush (NS) 0.9 % injection 10-40 mL, 10-40 mL, Intracatheter, PRN, Candee Furbish, MD  Patients Current Diet:  Diet Order             Diet - low sodium heart healthy           Diet regular Room service appropriate? No; Fluid consistency: Thin  Diet effective now                   Precautions / Restrictions Precautions Precautions: Fall Precaution Comments: EEG monitor Restrictions Weight Bearing Restrictions: No   Has the patient had 2 or more falls or a fall with injury in the past year? No  Prior Activity Level    Prior Functional Level Self Care: Did the patient need help bathing, dressing, using the toilet or eating? Independent  Indoor Mobility: Did the patient need assistance with walking from room to room (with or without device)? Independent  Stairs: Did the patient need assistance with internal or external stairs (with or without device)?  Independent  Functional Cognition: Did the patient need help planning regular tasks such as shopping or remembering to take medications? Independent  Patient Information Are you of Hispanic, Latino/a,or Spanish origin?: A. No, not of Hispanic, Latino/a, or Spanish origin What is your race?: A. White Do you need or want an interpreter to communicate with a doctor or health care staff?: 0. No  Patient's Response To:  Health Literacy and Transportation Is the patient able to respond to health literacy and transportation needs?: Yes Health Literacy - How often do you need to have someone help you when you read instructions, pamphlets, or other written material from your doctor or pharmacy?: Never In the past 12 months, has lack of transportation kept you from medical appointments or from getting medications?: No In the past 12 months, has lack of transportation kept you from meetings, work, or from getting things needed for daily living?: No  Development worker, international aid / [place] Devices/Equipment: None Home Equipment: Conservation officer, nature (2 wheels)  Prior Device Use: Indicate devices/aids used by the patient prior to current illness, exacerbation or injury? None of the above  Current Functional Level Cognition  Overall Cognitive Status: Impaired/Different from baseline Current Attention Level: Sustained Orientation Level: Oriented to person, Oriented to place, Oriented to situation Following Commands: Follows one step commands with increased time Safety/Judgement: Decreased awareness of safety, Decreased awareness of deficits General Comments: STM deficits noted, looking to daughter to answer questions    Extremity Assessment (includes Sensation/Coordination)  Upper Extremity Assessment: Generalized weakness  Lower Extremity Assessment: Defer to PT evaluation    ADLs  Overall ADL's : Needs assistance/impaired Eating/Feeding Details (indicate cue type and reason): spouse  reports yesterday unable to use spoon to feed self, but was able to today Grooming: Wash/dry face, Oral care, Standing Grooming Details (indicate cue type and reason): completed standing at sink Upper Body Dressing : Minimal assistance, Sitting Upper Body Dressing Details (indicate cue type and reason): assist to cue for sequencing, line mgmt Lower Body Dressing: Min guard, Sitting/lateral leans Lower Body Dressing Details (indicate cue type and reason): donning/doffing socks Toilet Transfer: Min guard, Rolling walker (2 wheels), Ambulation, Regular Toilet Toilet Transfer Details (indicate cue type and reason): simulated via functional mobility Toileting- Clothing Manipulation and Hygiene: Min guard, Sitting/lateral lean Toileting - Clothing Manipulation Details (indicate cue type and reason):  for pericare/clothing mgmt Functional mobility during ADLs: Min guard, Rolling walker (2 wheels) General ADL Comments: Focus on trial of further standing though per RN, did not want to place pt in chair today with EEG on    Mobility  Overal bed mobility: Needs Assistance Bed Mobility: Supine to Sit, Sit to Supine Supine to sit: Supervision Sit to supine: Supervision General bed mobility comments: No physical assist, + use of bed rail    Transfers  Overall transfer level: Needs assistance Equipment used: None Transfers: Sit to/from Stand Sit to Stand: Min assist General transfer comment: Light minA to power up from edge of bed and toilet    Ambulation / Gait / Stairs / Wheelchair Mobility  Ambulation/Gait Ambulation/Gait assistance: Herbalist (Feet): 100 Feet Assistive device: None Gait Pattern/deviations: Decreased stride length, Step-through pattern, Step-to pattern General Gait Details: Slowed step to vs step through pattern, frequently reaching out for additional external support, mild dynamic instability. cautious overall Gait velocity: 0.69 ft/s Gait velocity  interpretation: <1.31 ft/sec, indicative of household ambulator Pre-gait activities: Stood x 3 at EOB with +2 min assist. Twice shuffled feet laterally to move up toward head of bed.    Posture / Balance Dynamic Sitting Balance Sitting balance - Comments: able to reach outside BOS without LOB Balance Overall balance assessment: Needs assistance Sitting-balance support: No upper extremity supported, Feet supported, Bilateral upper extremity supported Sitting balance-Leahy Scale: Good Sitting balance - Comments: able to reach outside BOS without LOB Standing balance support: No upper extremity supported, During functional activity Standing balance-Leahy Scale: Fair Standing balance comment: can stand statically at sink without LOB    Special needs/care consideration None   Previous Home Environment (from acute therapy documentation) Living Arrangements: Spouse/significant other Available Help at Discharge: Family Type of Home: House Home Layout: One level Home Access: Stairs to enter [employer] of Steps: few Bathroom Shower/Tub: Tub/shower unit, Multimedia programmer: [place]: No  Discharge Living Setting Plans for Discharge Living Setting: Patient's home, House, Lives with (comment) (spouse) Discharge Home Layout: One level Discharge Home Access: Stairs to enter Entrance Stairs-Number of Steps: a few Discharge Bathroom Shower/Tub: Tub/shower unit, Walk-in shower Discharge Bathroom Toilet: Standard Does the patient have any problems obtaining your medications?: No  Social/Family/Support Systems Patient Roles: Spouse Anticipated Caregiver: Spouse, Bobby/daughter Dispensing optician Information: [phone] Caregiver Availability: 24/7 Discharge Plan Discussed with Primary Caregiver: Yes Is Caregiver In Agreement with Plan?: Yes Does Caregiver/Family have Issues with Lodging/Transportation while Pt is  in Rehab?: No  Goals Patient/Family Goal for Rehab: Mod I, PT, OT, Independent with SLP Expected length of stay: 10-12 days Pt/Family Agrees to Admission and willing to participate: Yes Program Orientation Provided & Reviewed with Pt/Caregiver Including Roles  & Responsibilities: Yes  Barriers to Discharge: Insurance for SNF coverage  Decrease burden of Care through IP rehab admission: Othern/a  Possible need for SNF placement upon discharge: not anticipated  Patient Condition: I have reviewed medical records from [facility], spoken with  TOC , and patient, spouse, and daughter. I met with patient at the bedside for inpatient rehabilitation assessment.  Patient will benefit from ongoing PT, OT, and SLP, can actively participate in 3 hours of therapy a day 5 days of the week, and can make measurable gains during the admission.  Patient will also benefit from the coordinated team approach during an Inpatient Acute Rehabilitation admission.  The patient will receive intensive therapy as well as Rehabilitation physician, nursing, social  worker, and care management interventions.  Due to safety, skin/wound care, disease management, medication administration, pain management, and patient education the patient requires 24 hour a day rehabilitation nursing.  The patient is currently Min A with mobility and basic ADLs.  Discharge setting and therapy post discharge at home with home health is anticipated.  Patient has agreed to participate in the Acute Inpatient Rehabilitation Program and will admit today, 03/23/22.  Preadmission Screen Completed By:  Retta Diones, 03/23/2022 12:18 PM ______________________________________________________________________   Discussed status with Dr. Tressa Busman on 03/23/22 at 1200 and received approval for admission today.  Admission Coordinator:  Retta Diones, RN, time 1217/Date 03/23/22   Assessment/Plan: Diagnosis: Does the need for close, 24 hr/day Medical  supervision in concert with the patient's rehab needs make it unreasonable for this patient to be served in a less intensive setting? Yes Co-Morbidities requiring supervision/potential complications: Jaundice secondary to pancreatic obstructing mass status post stent, AKI, seizure, encephalopathy, hypokalemia, hypomagnesemia, anemia,  AHRF, constipation Due to bowel management, safety, skin/wound care, disease management, medication administration, pain management, and patient education, does the patient require 24 hr/day rehab nursing? Yes Does the patient require coordinated care of a physician, rehab nurse, PT, OT, and SLP to address physical and functional deficits in the context of the above medical diagnosis(es)? Yes Addressing deficits in the following areas: balance, endurance, locomotion, strength, transferring, bowel/bladder control, bathing, dressing, feeding, grooming, toileting, cognition, and psychosocial support Can the patient actively participate in an intensive therapy program of at least 3 hrs of therapy 5 days a week? Yes The potential for patient to make measurable gains while on inpatient rehab is good Anticipated functional outcomes upon discharge from inpatient rehab: modified independent PT, modified independent OT, independent SLP Estimated rehab length of stay to reach the above functional goals is: 10-14 days Anticipated discharge destination: Home 10. Overall Rehab/Functional Prognosis: good   MD Signature:  Gertie Gowda, DO 03/23/2022

## 2022-03-20 NOTE — Progress Notes (Signed)
Physical Therapy Treatment Patient Details Name: Shirley Nichols MRN: [id number] DOB: 01/07/1941 Today's Date: 03/20/2022   History of Present Illness 81 yo female [facility] on 10[facility]6 with elevated liver enzymes, emesis. Pt recently diagnosed with UTI treated with nitrofurantoin. Ultrasound showing dilated gallbladder and CBD concerning for biliary ductal obstruction. CT of the abdomen and pelvis without IV contrast shows stranding around the pancreatic head concerning for possible pancreatic mass. s/p ERCP with stent placement on 10/8. encephalopathic starting 10/10, suspected aspiration PNA in R lung. Pt with seizures on 10/14 with EEG in place 10/16. Pancreatic adenocarcinoma confirmed. Pt with continued seizures and intubated 10/17, extubated 10/19.  PMH includes HTN, HLD.    PT Comments    Pt making good progress towards acute goals with session focused on progression of functional mobility for increased activity tolerance. Pt needing up to min assist throughout for bed mobility, transfers and gait. Pt  demonstrating gait with RW for x2 bouts in room with seated recovery break between bouts needed secondary to pt stated fatigue. Pt able to power up to standing with light min assist from EOB, recliner and chair. Pt VSS throughout and no c/o dizziness. Anticipate pt able to tolerate intensity of AIR level therapies and current plan remains appropriate to address deficits and maximize functional independence and decrease caregiver burden. Pt continues to benefit from skilled PT services to progress toward functional mobility goals.    Recommendations for follow up therapy are one component of a multi-disciplinary discharge planning process, led by the attending physician.  Recommendations may be updated based on patient status, additional functional criteria and insurance authorization.  Follow Up Recommendations  Acute inpatient rehab (3hours/day)     Assistance Recommended at Discharge  Frequent or constant Supervision/Assistance  Patient can return home with the following A lot of help with walking and/or transfers;A lot of help with bathing/dressing/bathroom;Assistance with cooking/housework;Direct supervision/assist for medications management;Assist for transportation;Help with stairs or ramp for entrance   Equipment Recommendations  None recommended by PT    Recommendations for Other Services       Precautions / Restrictions Precautions Precautions: Fall;Other (comment) Precaution Comments: EEG monitor Restrictions Weight Bearing Restrictions: No     Mobility  Bed Mobility Overal bed mobility: Needs Assistance Bed Mobility: Supine to Sit     Supine to sit: Min assist     General bed mobility comments: No Assist to bring LE's off of bed, HHA to elevate trunk    Transfers Overall transfer level: Needs assistance Equipment used: Rolling walker (2 wheels) Transfers: Sit to/from Stand Sit to Stand: Min assist           General transfer comment: light min assist to rise, cues for hand placement    Ambulation/Gait Ambulation/Gait assistance: Min assist Gait Distance (Feet): 20 Feet (x2) Assistive device: Rolling walker (2 wheels) Gait Pattern/deviations: Step-through pattern, Decreased stride length Gait velocity: decr     General Gait Details: slow guarded gait with RW, distance limited to pt fatigue, not c/o dizziness throughout   Stairs             Wheelchair Mobility    Modified Rankin (Stroke Patients Only)       Balance Overall balance assessment: Needs assistance Sitting-balance support: No upper extremity supported, Feet supported, Bilateral upper extremity supported Sitting balance-Leahy Scale: Poor Sitting balance - Comments: relies on UE support to sit EOB initially and progressed to no UE support.   Standing balance support: Bilateral upper extremity supported, During  functional activity Standing balance-Leahy  Scale: Poor Standing balance comment: BUE reliance on RW                            Cognition Arousal/Alertness: Awake/alert Behavior During Therapy: Flat affect, WFL for tasks assessed/performed Overall Cognitive Status: Impaired/Different from baseline                                 General Comments: Difficulty initiating getting up on EOB. Needed repetition of commands at times with some overall decreased insight/problem solving noted        Exercises      General Comments General comments (skin integrity, edema, etc.): VSS on RA      Pertinent Vitals/Pain Pain Assessment Pain Assessment: No/denies pain Pain Intervention(s): Monitored during session    Home Living                          Prior Function            PT Goals (current goals can now be found in the care plan section) Acute Rehab PT Goals PT Goal Formulation: With patient/family Time For Goal Achievement: 03/30/22    Frequency    Min 3X/week      PT Plan      Co-evaluation              AM-PAC PT "6 Clicks" Mobility   Outcome Measure  Help needed turning from your back to your side while in a flat bed without using bedrails?: A Lot Help needed moving from lying on your back to sitting on the side of a flat bed without using bedrails?: A Lot Help needed moving to and from a bed to a chair (including a wheelchair)?: A Little Help needed standing up from a chair using your arms (e.g., wheelchair or bedside chair)?: A Little Help needed to walk in hospital room?: A Little Help needed climbing 3-5 steps with a railing? : Total 6 Click Score: 14    End of Session Equipment Utilized During Treatment: Oxygen;Gait belt Activity Tolerance: Patient tolerated treatment well Patient left: with call bell/phone within reach;with family/visitor present;in chair Nurse Communication: Mobility status PT Visit Diagnosis: Other abnormalities of gait and mobility  (R26.89);Muscle weakness (generalized) (M62.81)     Time: 1006-1030 PT Time Calculation (min) (ACUTE ONLY): 24 min  Charges:  $Gait Training: 8-22 mins $Therapeutic Activity: 8-22 mins                     Quiera Diffee R. PTA Acute Rehabilitation Services Office: [place] 03/20/2022, 10:54 AM

## 2022-03-20 NOTE — Progress Notes (Signed)
  Progress Note   Patient: Shirley Nichols JQZ:[id number] DOB: [birth year] DOA: 03/03/2022     17 DOS: the patient was seen and examined on 03/20/2022   Brief hospital course: 81 y.o. F with HTN, HLD who presented with hyperbilirubinemia and renal failure.  Developed generalized weakness in the last few weeks, went to PCP, found to have jaundice, elevated LFTs and AKI, sent to the ER.   10/6: Admitted, MRCP shows masslike density in the head of pancreas with biliary obstruction 10/7: GI consulted 10/8: ERCP with stent placement 10/9: New post-ERCP pancreatitis, Cr back up 10/10: Pancreatitis resolved, Cr improving on fulids  On 10/13, found to have delirum and unresponsiveness, transferred to ICU. While in ICU, had seizures, requiring intubation. Neurology followed for EEG. Pt exutabated 10/19  Assessment and Plan: Acute toxic metabolic encephalopathy with Status epilepticus -Discussed with Neurology. 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 Appreciate Oncology input on 10/12. Recs for chemotherapy as outpatient.  -As pt and family reside in the [place] area, family prefers to establish at [place]  Normocytic anemia -hemodynamically stable -Cont to follow cbc trends  ARF -Cr stable at 1.13 -Recheck bmet in AM  Hypokalemia -replaced -recheck bmet in AM     Subjective: No complaints this AM  Physical Exam: Vitals:   03/20/22 0000 03/20/22 0351 03/20/22 0729 03/20/22  1231  BP: (!) 141/70 (!) 153/84 (!) 172/68 (!) 152/67  Pulse: 75 79 72 74  Resp: '14 17 14 19  '$ Temp:  98.1 F (36.7 C) (!) 97 F (36.1 C) 98 F (36.7 C)  TempSrc: Oral Oral Axillary Oral  SpO2: 98% 98% 98% 98%  Weight:      Height:       General exam: Awake, laying in bed, in nad Respiratory system: Normal respiratory effort, no wheezing Cardiovascular system: regular rate, s1, s2 Gastrointestinal system: Soft, nondistended, positive BS Central nervous system: CN2-12 grossly intact, strength intact Extremities: Perfused, no clubbing Skin: Normal skin turgor, no notable skin lesions seen Psychiatry: Mood normal // no visual hallucinations   Data Reviewed:  Labs reviewed: Na 137, K 3.8, Cr 1.13  Family Communication: Pt in room, family not at bedside  Disposition: Status is: Inpatient Remains inpatient appropriate because: Severity of illness  Planned Discharge Destination: Home    Author: Marylu Lund, MD 03/20/2022 3:20 PM  For on call review [email or web address].

## 2022-03-20 NOTE — Progress Notes (Addendum)
Occupational Therapy Treatment Patient Details Name: Shirley Nichols MRN: [id number] DOB: 27-Jan-1941 Today's Date: 03/20/2022   History of present illness 81 yo female presents to [facility] on 10/6 with elevated liver enzymes, emesis. Pt recently diagnosed with UTI treated with nitrofurantoin. Ultrasound showing dilated gallbladder and CBD concerning for biliary ductal obstruction. CT of the abdomen and pelvis without IV contrast shows stranding around the pancreatic head concerning for possible pancreatic mass. s/p ERCP with stent placement on 10/8. encephalopathic starting 10/10, suspected aspiration PNA in R lung. Pt with seizures on 10/14 with EEG in place 10/16. Pancreatic adenocarcinoma confirmed. Pt with continued seizures and intubated 10/17, extubated 10/19.  PMH includes HTN, HLD.   OT comments  Pt progressing towards goals thi session, able to ambulate short distance to bathroom with min guard A and use of RW. Pt needing min guard for pericare and standing grooming task. Pt min A for bed mobility with assist for trunk elevation. SpO2 reading in 90's on RA with therapist's pulse ox, reading in 70's on monitor at beginning of session, pt asymptomatic throughout session and RN notified. Pt with continued slow processing, needing increased verbal cues to perform tasks. Pt presenting with impairments listed below, will follow acutely. Continue to recommend AIR at d/c.   Recommendations for follow up therapy are one component of a multi-disciplinary discharge planning process, led by the attending physician.  Recommendations may be updated based on patient status, additional functional criteria and insurance authorization.    Follow Up Recommendations  Acute inpatient rehab (3hours/day)    Assistance Recommended at Discharge Frequent or constant Supervision/Assistance  Patient can return home with the following  Assistance with cooking/housework;Direct supervision/assist for medications  management;Direct supervision/assist for financial management;Assist for transportation;Help with stairs or ramp for entrance;A lot of help with bathing/dressing/bathroom;A lot of help with walking and/or transfers;Assistance with feeding   Equipment Recommendations  BSC/3in1    Recommendations for Other Services PT consult;Rehab consult    Precautions / Restrictions Precautions Precautions: Fall Restrictions Weight Bearing Restrictions: No       Mobility Bed Mobility Overal bed mobility: Needs Assistance Bed Mobility: Supine to Sit     Supine to sit: Min assist Sit to supine: Min assist   General bed mobility comments: assist for trunk elevation    Transfers Overall transfer level: Needs assistance Equipment used: Rolling walker (2 wheels) Transfers: Sit to/from Stand Sit to Stand: Min assist           General transfer comment: light min assist to rise, cues for hand placement     Balance Overall balance assessment: Needs assistance Sitting-balance support: No upper extremity supported, Feet supported, Bilateral upper extremity supported Sitting balance-Leahy Scale: Good Sitting balance - Comments: sits EOB at end of session to eat lunch with daughter present   Standing balance support: Bilateral upper extremity supported, During functional activity Standing balance-Leahy Scale: Poor Standing balance comment: BUE reliance on RW                           ADL either performed or assessed with clinical judgement   ADL Overall ADL's : Needs assistance/impaired     Grooming: Wash/dry hands;Standing;Min guard                   Toilet Transfer: Min guard;Rolling walker (2 wheels);Ambulation;Regular Toilet   Toileting- Water quality scientist and Hygiene: Min guard;Sitting/lateral lean Toileting - Clothing Manipulation Details (indicate cue type and reason): for pericare/clothing  mgmt     Functional mobility during ADLs: Min guard;Rolling  walker (2 wheels)      Extremity/Trunk Assessment Upper Extremity Assessment Upper Extremity Assessment: Generalized weakness   Lower Extremity Assessment Lower Extremity Assessment: Defer to PT evaluation        Vision   Vision Assessment?: No apparent visual deficits   Perception Perception Perception: Not tested   Praxis Praxis Praxis: Not tested    Cognition Arousal/Alertness: Lethargic Behavior During Therapy: Flat affect, WFL for tasks assessed/performed Overall Cognitive Status: Impaired/Different from baseline                           Safety/Judgement: Decreased awareness of safety, Decreased awareness of deficits Awareness: Emergent Problem Solving: Slow processing, Decreased initiation, Difficulty sequencing, Requires verbal cues, Requires tactile cues General Comments: slow initiation with tasks, requires increased verbal and tactile cues        Exercises      Shoulder Instructions       General Comments SpO2 reading in 70's upon arrival with pt sleeping on RA, took SpO2 with pulse ox, reading in 90's, continuing to read  in 90's throughout session and at end of session on RA. RN notified    Pertinent Vitals/ Pain       Pain Assessment Pain Assessment: No/denies pain  Home Living                                          Prior Functioning/Environment              Frequency  Min 2X/week        Progress Toward Goals  OT Goals(current goals can now be found in the care plan section)  Progress towards OT goals: Progressing toward goals  Acute Rehab OT Goals Patient Stated Goal: to eat lunch OT Goal Formulation: With patient/family Time For Goal Achievement: 03/23/22 Potential to Achieve Goals: Fair ADL Goals Pt Will Perform Grooming: with supervision;standing Pt Will Perform Lower Body Dressing: with supervision;sit to/from stand Pt Will Transfer to Toilet: with supervision;ambulating;bedside commode Pt  Will Perform Toileting - Clothing Manipulation and hygiene: with supervision;sit to/from stand Additional ADL Goal #1: Pt will scan to locate 3/5 items during ADL routine with supervision. Additional ADL Goal #2: Pt will demonstrate emergent awarness during ADL routine.  Plan Frequency remains appropriate;Discharge plan needs to be updated    Co-evaluation                 AM-PAC OT "6 Clicks" Daily Activity     Outcome Measure   Help from another person eating meals?: A Little Help from another person taking care of personal grooming?: A Little Help from another person toileting, which includes using toliet, bedpan, or urinal?: A Little Help from another person bathing (including washing, rinsing, drying)?: A Lot Help from another person to put on and taking off regular upper body clothing?: A Little Help from another person to put on and taking off regular lower body clothing?: A Lot 6 Click Score: 16    End of Session Equipment Utilized During Treatment: Rolling walker (2 wheels);Gait belt  OT Visit Diagnosis: Other abnormalities of gait and mobility (R26.89);Other symptoms and signs involving cognitive function;Low vision, both eyes (H54.2)   Activity Tolerance Patient tolerated treatment well   Patient Left in bed;with call bell/phone within reach;with bed  alarm set;with family/visitor present   Nurse Communication Mobility status        Time: 1400-1419 OT Time Calculation (min): 19 min  Charges: OT General Charges $OT Visit: 1 Visit OT Treatments $Self Care/Home Management : 8-22 mins  Lynnda Child, OTD, OTR/L Acute Rehab [phone]) [phone] - [place] 03/20/2022, 2:28 PM

## 2022-03-20 NOTE — Progress Notes (Addendum)
Inpatient Rehab Admissions Coordinator:   Met with patient and daughter at bedside this am. Daughter reports she would like to pursue CIR for her mother. Reached out to OT team, as no OT was assigned to patient today. OT will see patient today for updated note. Request sent to [employer] for prior approval for potential CIR admission. Will continue to follow.   Rehab Admissons Coordinator [place], [place], [place] [phone]

## 2022-03-20 NOTE — Progress Notes (Signed)
Speech Language Pathology Treatment: Dysphagia  Patient Details Name: Shirley Nichols MRN: [id number] DOB: 04-11-1941 Today's Date: 03/20/2022 Time: 5427-0623 SLP Time Calculation (min) (ACUTE ONLY): 18 min  Assessment / Plan / Recommendation Clinical Impression  Pt is alert, very cooperative, and more engaged in PO trials today. She and her daughter report that swallowing itself has been going well, but PO intake overall has been limited due to low appetite. She consumed regular solids and thin liquids without overt evidence of dysphagia. Daughter did share that pt has tried to order a few items recently that were not approved for the mechanical soft diet, so I discussed with them my recommendation to advance to regular textures. They are both in agreement. Pt's daughter also shared that two pills in particular have been harder to swallow because they are so big, so encouraged them to try those pills whole in puree if she would like. SLP will f/u briefly in light of diet advancement and fluctuating status so far this admission, but do not anticipating much more ongoing needs from a swallowing standpoint.    HPI HPI: 81 yo female presents to [facility] on 10/6 with elevated liver enzymes, emesis. Pt recently diagnosed with UTI treated with nitrofurantoin. Ultrasound showing dilated gallbladder and CBD concerning for biliary ductal obstruction. CT of the abdomen and pelvis without IV contrast shows stranding around the pancreatic head concerning for possible pancreatic mass. s/p ERCP with stent placement on 10/8. Encephalopathic starting 10/10, suspected aspiration PNA in R lung. Pt with seizures on 10/14 with EEG in place 10/16. Pancreatic adenocarcinoma confirmed. MRI without acute findings. Pt with continued seizures and AMS requiring intubation 10/17-10/19. There was also concern for aspiration and N/V on 10/17. PMH includes HTN, HLD.      SLP Plan  Continue with current plan of care       Recommendations for follow up therapy are one component of a multi-disciplinary discharge planning process, led by the attending physician.  Recommendations may be updated based on patient status, additional functional criteria and insurance authorization.    Recommendations  Diet recommendations: Regular;Thin liquid Liquids provided via: Cup;Straw Medication Administration: Whole meds with liquid (could consider trying larger pills whole in puree per pt preference) Supervision: Patient able to self feed;Full supervision/cueing for compensatory strategies Compensations: Slow rate;Small sips/bites;Minimize environmental distractions Postural Changes and/or Swallow Maneuvers: Seated upright 90 degrees;Upright 30-60 min after meal                Oral Care Recommendations: Oral care BID Follow Up Recommendations: No SLP follow up Assistance recommended at discharge: Frequent or constant Supervision/Assistance SLP Visit Diagnosis: Dysphagia, unspecified (R13.10) Plan: Continue with current plan of care           Osie Bond., M.A. [place] Office [phone]  Secure chat preferred   03/20/2022, 1:33 PM

## 2022-03-21 ENCOUNTER — Inpatient Hospital Stay: Payer: PRIVATE HEALTH INSURANCE | Admitting: Internal Medicine

## 2022-03-21 ENCOUNTER — Inpatient Hospital Stay: Payer: PRIVATE HEALTH INSURANCE

## 2022-03-21 DIAGNOSIS — G9340 Encephalopathy, unspecified: Secondary | ICD-10-CM | POA: Diagnosis not present

## 2022-03-21 DIAGNOSIS — J9601 Acute respiratory failure with hypoxia: Secondary | ICD-10-CM | POA: Diagnosis not present

## 2022-03-21 DIAGNOSIS — R7989 Other specified abnormal findings of blood chemistry: Secondary | ICD-10-CM | POA: Diagnosis not present

## 2022-03-21 LAB — GLUCOSE, CAPILLARY
Glucose-Capillary: 99 mg/dL (ref 70–99)
Glucose-Capillary: 115 mg/dL — ABNORMAL HIGH (ref 70–99)
Glucose-Capillary: 108 mg/dL — ABNORMAL HIGH (ref 70–99)
Glucose-Capillary: 136 mg/dL — ABNORMAL HIGH (ref 70–99)
Glucose-Capillary: 137 mg/dL — ABNORMAL HIGH (ref 70–99)
Glucose-Capillary: 121 mg/dL — ABNORMAL HIGH (ref 70–99)

## 2022-03-21 LAB — COMPREHENSIVE METABOLIC PANEL
ALT: 35 U/L (ref 0–44)
AST: 21 U/L (ref 15–41)
Albumin: 2.1 g/dL — ABNORMAL LOW (ref 3.5–5.0)
Alkaline Phosphatase: 250 U/L — ABNORMAL HIGH (ref 38–126)
Anion gap: 8 (ref 5–15)
BUN: 15 mg/dL (ref 8–23)
CO2: 29 mmol/L (ref 22–32)
Calcium: 8.2 mg/dL — ABNORMAL LOW (ref 8.9–10.3)
Chloride: 101 mmol/L (ref 98–111)
Creatinine, Ser: 1.07 mg/dL — ABNORMAL HIGH (ref 0.44–1.00)
GFR, Estimated: 52 mL/min — ABNORMAL LOW (ref >60)
Glucose, Bld: 117 mg/dL — ABNORMAL HIGH (ref 70–99)
Potassium: 3.2 mmol/L — ABNORMAL LOW (ref 3.5–5.1)
Sodium: 138 mmol/L (ref 135–145)
Total Bilirubin: 0.7 mg/dL (ref 0.3–1.2)
Total Protein: 5.3 g/dL — ABNORMAL LOW (ref 6.5–8.1)

## 2022-03-21 LAB — CBC
HCT: 26.7 % — ABNORMAL LOW (ref 36.0–46.0)
Hemoglobin: 8.7 g/dL — ABNORMAL LOW (ref 12.0–15.0)
MCH: 26.7 pg (ref 26.0–34.0)
MCHC: 32.6 g/dL (ref 30.0–36.0)
MCV: 81.9 fL (ref 80.0–100.0)
Platelets: 324 10*3/uL (ref 150–400)
RBC: 3.26 MIL/uL — ABNORMAL LOW (ref 3.87–5.11)
RDW: 15 % (ref 11.5–15.5)
WBC: 6.9 10*3/uL (ref 4.0–10.5)
nRBC: 0 % (ref 0.0–0.2)

## 2022-03-21 MED ORDER — POTASSIUM CHLORIDE CRYS ER 20 MEQ PO TBCR
60.0000 meq | EXTENDED_RELEASE_TABLET | Freq: Once | ORAL | Status: AC
  Administered 2022-03-21: 60 meq via ORAL
  Filled 2022-03-21: qty 3

## 2022-03-21 NOTE — Progress Notes (Signed)
Mobility Specialist Progress Note   03/21/22 1235  Mobility  Activity Ambulated with assistance in hallway  Level of Assistance Standby assist, set-up cues, supervision of patient - no hands on  Assistive Device Front wheel walker  Distance Ambulated (ft) 80 ft  Range of Motion/Exercises Active;All extremities  Activity Response Tolerated well   Patient received in supine agreeable to participate. Ambulated supervision level with slow steady gait. Returned to room without complaint or incident. Was left in supine with all needs met, call bell in reach.   [name], [place], [place]  Office: [phone]

## 2022-03-21 NOTE — Progress Notes (Signed)
  Progress Note   Patient: Shirley Nichols HTD:[id number] DOB: 08-07-1940 DOA: 03/03/2022     18 DOS: the patient was seen and examined on 03/21/2022   Brief hospital course: 81 y.o. F with HTN, HLD who presented with hyperbilirubinemia and renal failure.  Developed generalized weakness in the last few weeks, went to PCP, found to have jaundice, elevated LFTs and AKI, sent to the ER.   10/6: Admitted, MRCP shows masslike density in the head of pancreas with biliary obstruction 10/7: GI consulted 10/8: ERCP with stent placement 10/9: New post-ERCP pancreatitis, Cr back up 10/10: Pancreatitis resolved, Cr improving on fulids  On 10/13, found to have delirum and unresponsiveness, transferred to ICU. While in ICU, had seizures, requiring intubation. Neurology followed for EEG. Pt exutabated 10/19  Assessment and Plan: Acute toxic metabolic encephalopathy with Status epilepticus -Discussed with Neurology. 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 Appreciate Oncology input on 10/12. Recs for chemotherapy as outpatient.  -discussed with Dr. Alvy Bimler, who will help arrange oncology near pt's home in [facility] area  Normocytic anemia -hemodynamically stable -Cont to follow cbc trends  ARF -Cr stable at 1.07 -Recheck bmet in AM  Hypokalemia -will replace -recheck bmet in AM     Subjective: Without complaints  Physical Exam: Vitals:   03/21/22 0826 03/21/22 1048 03/21/22 1228  03/21/22 1521  BP: (!) 173/67 (!) 150/61 139/70 (!) 162/65  Pulse: 78 74 80 80  Resp: '15 15 15 16  '$ Temp: (!) 97.4 F (36.3 C) 97.9 F (36.6 C) 98 F (36.7 C) 98.2 F (36.8 C)  TempSrc: Oral Oral Oral Oral  SpO2: 92% 94% 93% 92%  Weight:      Height:       General exam: Conversant, in no acute distress Respiratory system: normal chest rise, clear, no audible wheezing Cardiovascular system: regular rhythm, s1-s2 Gastrointestinal system: Nondistended, nontender, pos BS Central nervous system: No seizures, no tremors Extremities: No cyanosis, no joint deformities Skin: No rashes, no pallor Psychiatry: Affect normal // no auditory hallucinations   Data Reviewed:  Labs reviewed: Na 138, K 3.2, Cr 1.07  Family Communication: Pt in room, family not at bedside  Disposition: Status is: Inpatient Remains inpatient appropriate because: Severity of illness  Planned Discharge Destination: Rehab    Author: Marylu Lund, MD 03/21/2022 4:15 PM  For on call review [email or web address].

## 2022-03-21 NOTE — Plan of Care (Signed)

## 2022-03-21 NOTE — Plan of Care (Signed)

## 2022-03-21 NOTE — Progress Notes (Addendum)
Inpatient Rehab Admissions Coordinator:   Addendum: gave husband, Mortimer Fries update by phone.   Called [employer] to get update on insurance decision for CIR. Left message. Updated patient and daughter at bedside. Will continue to follow.    Rehab Admissons Coordinator [place], [place], [place] [phone]

## 2022-03-22 DIAGNOSIS — C250 Malignant neoplasm of head of pancreas: Secondary | ICD-10-CM | POA: Diagnosis not present

## 2022-03-22 LAB — GLUCOSE, CAPILLARY
Glucose-Capillary: 112 mg/dL — ABNORMAL HIGH (ref 70–99)
Glucose-Capillary: 113 mg/dL — ABNORMAL HIGH (ref 70–99)
Glucose-Capillary: 123 mg/dL — ABNORMAL HIGH (ref 70–99)
Glucose-Capillary: 129 mg/dL — ABNORMAL HIGH (ref 70–99)
Glucose-Capillary: 134 mg/dL — ABNORMAL HIGH (ref 70–99)
Glucose-Capillary: 98 mg/dL (ref 70–99)

## 2022-03-22 LAB — COMPREHENSIVE METABOLIC PANEL
ALT: 32 U/L (ref 0–44)
AST: 22 U/L (ref 15–41)
Albumin: 2.1 g/dL — ABNORMAL LOW (ref 3.5–5.0)
Alkaline Phosphatase: 223 U/L — ABNORMAL HIGH (ref 38–126)
Anion gap: 9 (ref 5–15)
BUN: 16 mg/dL (ref 8–23)
CO2: 27 mmol/L (ref 22–32)
Calcium: 8.3 mg/dL — ABNORMAL LOW (ref 8.9–10.3)
Chloride: 102 mmol/L (ref 98–111)
Creatinine, Ser: 1.18 mg/dL — ABNORMAL HIGH (ref 0.44–1.00)
GFR, Estimated: 46 mL/min — ABNORMAL LOW (ref >60)
Glucose, Bld: 106 mg/dL — ABNORMAL HIGH (ref 70–99)
Potassium: 3.4 mmol/L — ABNORMAL LOW (ref 3.5–5.1)
Sodium: 138 mmol/L (ref 135–145)
Total Bilirubin: 0.8 mg/dL (ref 0.3–1.2)
Total Protein: 5.2 g/dL — ABNORMAL LOW (ref 6.5–8.1)

## 2022-03-22 LAB — CBC
HCT: 25.9 % — ABNORMAL LOW (ref 36.0–46.0)
Hemoglobin: 8.1 g/dL — ABNORMAL LOW (ref 12.0–15.0)
MCH: 26 pg (ref 26.0–34.0)
MCHC: 31.3 g/dL (ref 30.0–36.0)
MCV: 83 fL (ref 80.0–100.0)
Platelets: 321 10*3/uL (ref 150–400)
RBC: 3.12 MIL/uL — ABNORMAL LOW (ref 3.87–5.11)
RDW: 15.1 % (ref 11.5–15.5)
WBC: 6.6 10*3/uL (ref 4.0–10.5)
nRBC: 0 % (ref 0.0–0.2)

## 2022-03-22 NOTE — Progress Notes (Signed)
Inpatient Rehab Admissions Coordinator:    Continue to await insurance auth for CIR. I will follow for admit once auth is received.   Clemens Catholic, [place], [place] Admissions Coordinator  [phone] ([place]) [phone] (office)

## 2022-03-22 NOTE — Progress Notes (Signed)
  Progress Note   Patient: Shirley Nichols [id number]:[id number] DOB: 03-04-1941 DOA: 03/03/2022     19 DOS: the patient was seen and examined on 03/22/2022   Brief hospital course: 81 y.o. F with HTN, HLD who presented with hyperbilirubinemia and renal failure.  Developed generalized weakness in the last few weeks, went to PCP, found to have jaundice, elevated LFTs and AKI, sent to the ER.   10/6: Admitted, MRCP shows masslike density in the head of pancreas with biliary obstruction 10/7: GI consulted 10/8: ERCP with stent placement 10/9: New post-ERCP pancreatitis, Cr back up 10/10: Pancreatitis resolved, Cr improving on fluids 10/13: found to have delirum and unresponsiveness, transferred to ICU. While in ICU, had focal epileptic seizures + focal status, requiring intubation. Neurology followed for EEG. 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 Appreciate Oncology input on 10/12. Recs for chemotherapy as outpatient--this will occur in [place] area -discussed with Dr. Alvy Bimler, who will help arrange oncology near pt's home in [place] area  Normocytic anemia -hemodynamically stable -Cont to follow cbc trends  ARF -Cr stable periodic  labs  Hypokalemia -will replace -recheck bmet in AM     Subjective:  Some mild deficit to memory-think she is in [address] tell me the year remembers that the month is October Does not recall breakfast Daughter is at the bedside  Physical Exam: Vitals:   03/22/22 0431 03/22/22 0745 03/22/22 0820 03/22/22 1131  BP: (!) 171/72 (!) 173/74  (!) 171/74  Pulse: 79 76 76 77  Resp: '16 16  15  '$ Temp: 98 F (36.7 C) 98 F (36.7 C)  98.1 F (36.7 C)  TempSrc: Oral Oral  Oral  SpO2: 93% 93% 93% 96%  Weight:      Height:       General exam: Awake in no acute distress seems comfortable on room air Respiratory system: normal chest rise without wheeze or rales Cardiovascular system: regular rhythm, s1-s2 Gastrointestinal system: Nondistended, nontender, pos BS Central nervous system: No seizures, no tremors-no asterixis Extremities: No cyanosis, no joint deformities Skin: No rashes, no pallor Psychiatry: Affect normal // no auditory hallucinations   Data Reviewed:  Labs reviewed: Potassium 3.4 creatinine 1.18  Family Communication: Pt in room discussed with daughter who is at the bedside  Disposition: Status is: Inpatient Remains inpatient appropriate because: Severity of illness  Planned Discharge Destination: Rehab    Author: Nita Sells, MD 03/22/2022 1:10 PM  For on call review [email or web address].

## 2022-03-22 NOTE — Progress Notes (Addendum)
Occupational Therapy Treatment Patient Details Name: Shirley Nichols MRN: [id number] DOB: [birth year] Today's Date: 03/22/2022   History of present illness 81 yo female presents to [facility] on 10/6 with elevated liver enzymes, emesis. Pt recently diagnosed with UTI treated with nitrofurantoin. Ultrasound showing dilated gallbladder and CBD concerning for biliary ductal obstruction. CT of the abdomen and pelvis without IV contrast shows stranding around the pancreatic head concerning for possible pancreatic mass. s/p ERCP with stent placement on 10/8. encephalopathic starting 10/10, suspected aspiration PNA in R lung. Pt with seizures on 10/14 with EEG in place 10/16. Pancreatic adenocarcinoma confirmed. Pt with continued seizures and intubated 10/17, extubated 10/19.  PMH includes HTN, HLD.   OT comments  Pt progressing towards goals, able to stand x10 min for grooming task at sink. Pt supervision for bed mobility and min A for transfers  with RW. Pt able to complete LB dressing with min guard A seated up in chair. Continues to present with slow processing needing increased cues to complete tasks. Pt presenting with impairments listed below, will follow acutely. Continue to recommend AIR at d/c.   Recommendations for follow up therapy are one component of a multi-disciplinary discharge planning process, led by the attending physician.  Recommendations may be updated based on patient status, additional functional criteria and insurance authorization.    Follow Up Recommendations  Acute inpatient rehab (3hours/day)    Assistance Recommended at Discharge Frequent or constant Supervision/Assistance  Patient can return home with the following  Assistance with cooking/housework;Direct supervision/assist for medications management;Direct supervision/assist for financial management;Assist for transportation;Help with stairs or ramp for entrance;A lot of help with bathing/dressing/bathroom;A lot of help with  walking and/or transfers;Assistance with feeding   Equipment Recommendations  BSC/3in1    Recommendations for Other Services PT consult;Rehab consult    Precautions / Restrictions Precautions Precautions: Fall Restrictions Weight Bearing Restrictions: No       Mobility Bed Mobility Overal bed mobility: Needs Assistance Bed Mobility: Supine to Sit, Sit to Supine     Supine to sit: Supervision          Transfers Overall transfer level: Needs assistance Equipment used: None Transfers: Sit to/from Stand Sit to Stand: Min assist                 Balance Overall balance assessment: Needs assistance Sitting-balance support: No upper extremity supported, Feet supported, Bilateral upper extremity supported Sitting balance-Leahy Scale: Good Sitting balance - Comments: able to reach outside BOS without LOB   Standing balance support: No upper extremity supported, During functional activity Standing balance-Leahy Scale: Fair Standing balance comment: can stand statically at sink without LOB                           ADL either performed or assessed with clinical judgement   ADL Overall ADL's : Needs assistance/impaired     Grooming: Wash/dry face;Oral care;Standing Grooming Details (indicate cue type and reason): completed standing at sink             Lower Body Dressing: Min guard;Sitting/lateral leans Lower Body Dressing Details (indicate cue type and reason): donning/doffing socks Toilet Transfer: Min guard;Rolling walker (2 wheels);Ambulation;Regular Glass blower/designer Details (indicate cue type and reason): simulated via functional mobility         Functional mobility during ADLs: Min guard;Rolling walker (2 wheels)      Extremity/Trunk Assessment Upper Extremity Assessment Upper Extremity Assessment: Generalized weakness   Lower Extremity Assessment  Lower Extremity Assessment: Defer to PT evaluation        Vision        Perception Perception Perception: Not tested   Praxis Praxis Praxis: Not tested    Cognition Arousal/Alertness: Awake/alert Behavior During Therapy: WFL for tasks assessed/performed Overall Cognitive Status: Impaired/Different from baseline                       Memory: Decreased short-term memory   Safety/Judgement: Decreased awareness of safety, Decreased awareness of deficits   Problem Solving: Slow processing, Decreased initiation, Difficulty sequencing, Requires verbal cues, Requires tactile cues          Exercises      Shoulder Instructions       General Comments VSS on RA -- daughter present in room and supportive    Pertinent Vitals/ Pain       Pain Assessment Pain Assessment: No/denies pain  Home Living                                          Prior Functioning/Environment              Frequency  Min 2X/week        Progress Toward Goals  OT Goals(current goals can now be found in the care plan section)  Progress towards OT goals: Progressing toward goals  Acute Rehab OT Goals Patient Stated Goal: none stated OT Goal Formulation: With patient/family Time For Goal Achievement: 04/05/22 Potential to Achieve Goals: Good ADL Goals Pt Will Perform Grooming: with supervision;standing Pt Will Perform Lower Body Dressing: with supervision;sit to/from stand Pt Will Transfer to Toilet: with supervision;ambulating;bedside commode Pt Will Perform Toileting - Clothing Manipulation and hygiene: with supervision;sit to/from stand Additional ADL Goal #1: Pt will scan to locate 3/5 items during ADL routine with supervision. Additional ADL Goal #2: Pt will demonstrate emergent awarness during ADL routine.  Plan Frequency remains appropriate;Discharge plan needs to be updated    Co-evaluation                 AM-PAC OT "6 Clicks" Daily Activity     Outcome Measure   Help from another person eating meals?: A  Little Help from another person taking care of personal grooming?: A Little Help from another person toileting, which includes using toliet, bedpan, or urinal?: A Little Help from another person bathing (including washing, rinsing, drying)?: A Lot Help from another person to put on and taking off regular upper body clothing?: A Little Help from another person to put on and taking off regular lower body clothing?: A Little 6 Click Score: 17    End of Session Equipment Utilized During Treatment: Rolling walker (2 wheels);Gait belt  OT Visit Diagnosis: Other abnormalities of gait and mobility (R26.89);Other symptoms and signs involving cognitive function;Low vision, both eyes (H54.2)   Activity Tolerance Patient tolerated treatment well   Patient Left in chair;with call bell/phone within reach;with family/visitor present   Nurse Communication Mobility status        Time: 1344-1405 OT Time Calculation (min): 21 min  Charges: OT General Charges $OT Visit: 1 Visit OT Treatments $Self Care/Home Management : 8-22 mins  Lynnda Child, OTD, OTR/L Acute Rehab [phone] - Circle 03/22/2022, 3:30 PM

## 2022-03-22 NOTE — Progress Notes (Signed)
Physical Therapy Treatment Patient Details Name: Shirley Nichols MRN: [id number] DOB: [birth year] Today's Date: 03/22/2022   History of Present Illness 81 yo female presents to [facility] on 10/6 with elevated liver enzymes, emesis. Pt recently diagnosed with UTI treated with nitrofurantoin. Ultrasound showing dilated gallbladder and CBD concerning for biliary ductal obstruction. CT of the abdomen and pelvis without IV contrast shows stranding around the pancreatic head concerning for possible pancreatic mass. s/p ERCP with stent placement on 10/8. encephalopathic starting 10/10, suspected aspiration PNA in R lung. Pt with seizures on 10/14 with EEG in place 10/16. Pancreatic adenocarcinoma confirmed. Pt with continued seizures and intubated 10/17, extubated 10/19.  PMH includes HTN, HLD.    PT Comments    Pt making steady progress towards her physical therapy goals. Session focused on static/dynamic balance activities and functional strengthening. Pt ambulating ~80 ft with no assistive device at a minimal assist level. Gait speed of 0.69 ft/s indicative of high fall risk. Pt would benefit from AIR to address deficits and maximize functional independence. Suspect good progress given PLOF, motivation and family support.     Recommendations for follow up therapy are one component of a multi-disciplinary discharge planning process, led by the attending physician.  Recommendations may be updated based on patient status, additional functional criteria and insurance authorization.  Follow Up Recommendations  Acute inpatient rehab (3hours/day)     Assistance Recommended at Discharge Frequent or constant Supervision/Assistance  Patient can return home with the following A little help with walking and/or transfers;Assistance with cooking/housework;Direct supervision/assist for medications management;Direct supervision/assist for financial management;Assist for transportation;Help with stairs or ramp for  entrance   Equipment Recommendations  None recommended by PT    Recommendations for Other Services       Precautions / Restrictions Precautions Precautions: Fall Restrictions Weight Bearing Restrictions: No     Mobility  Bed Mobility Overal bed mobility: Needs Assistance Bed Mobility: Supine to Sit, Sit to Supine     Supine to sit: Supervision Sit to supine: Supervision   General bed mobility comments: No physical assist, + use of bed rail    Transfers Overall transfer level: Needs assistance Equipment used: None Transfers: Sit to/from Stand Sit to Stand: Min assist           General transfer comment: Light minA to power up from edge of bed and toilet    Ambulation/Gait Ambulation/Gait assistance: Min assist Gait Distance (Feet): 100 Feet Assistive device: None Gait Pattern/deviations: Decreased stride length, Step-through pattern, Step-to pattern Gait velocity: 0.69 ft/s Gait velocity interpretation: <1.31 ft/sec, indicative of household ambulator   General Gait Details: Slowed step to vs step through pattern, frequently reaching out for additional external support, mild dynamic instability. cautious overall   Stairs             Wheelchair Mobility    Modified Rankin (Stroke Patients Only)       Balance Overall balance assessment: Needs assistance Sitting-balance support: No upper extremity supported, Feet supported, Bilateral upper extremity supported Sitting balance-Leahy Scale: Good     Standing balance support: No upper extremity supported, During functional activity Standing balance-Leahy Scale: Poor Standing balance comment: bracing posteriorly when donning underwear                            Cognition Arousal/Alertness: Awake/alert Behavior During Therapy: WFL for tasks assessed/performed Overall Cognitive Status: Impaired/Different from baseline  Memory: Decreased short-term memory          General Comments: STM deficits noted, looking to daughter to answer questions        Exercises Other Exercises Other Exercises: x5 serial sit to stands    General Comments        Pertinent Vitals/Pain Pain Assessment Pain Assessment: Faces Faces Pain Scale: No hurt    Home Living                          Prior Function            PT Goals (current goals can now be found in the care plan section) Acute Rehab PT Goals Potential to Achieve Goals: Good Progress towards PT goals: Progressing toward goals    Frequency    Min 3X/week      PT Plan Current plan remains appropriate    Co-evaluation              AM-PAC PT "6 Clicks" Mobility   Outcome Measure  Help needed turning from your back to your side while in a flat bed without using bedrails?: None Help needed moving from lying on your back to sitting on the side of a flat bed without using bedrails?: A Little Help needed moving to and from a bed to a chair (including a wheelchair)?: A Little Help needed standing up from a chair using your arms (e.g., wheelchair or bedside chair)?: A Little Help needed to walk in hospital room?: A Little Help needed climbing 3-5 steps with a railing? : A Little 6 Click Score: 19    End of Session Equipment Utilized During Treatment: Gait belt Activity Tolerance: Patient tolerated treatment well Patient left: in bed;with call bell/phone within reach;with family/visitor present Nurse Communication: Mobility status PT Visit Diagnosis: Other abnormalities of gait and mobility (R26.89);Muscle weakness (generalized) (M62.81)     Time: 5277-8242 PT Time Calculation (min) (ACUTE ONLY): 24 min  Charges:  $Therapeutic Activity: 23-37 mins                     Wyona Almas, PT, DPT Acute Rehabilitation Services Office [place] 03/22/2022, 11:45 AM

## 2022-03-23 ENCOUNTER — Encounter (HOSPITAL_COMMUNITY): Payer: Self-pay | Admitting: Physical Medicine and Rehabilitation

## 2022-03-23 ENCOUNTER — Other Ambulatory Visit: Payer: Self-pay

## 2022-03-23 ENCOUNTER — Inpatient Hospital Stay (HOSPITAL_COMMUNITY)
Admission: RE | Admit: 2022-03-23 | Discharge: 2022-03-31 | DRG: 091 | Disposition: A | Discharge status: Home / Self Care | Payer: PPO | Source: Intra-hospital | Attending: Physical Medicine and Rehabilitation | Admitting: Physical Medicine and Rehabilitation

## 2022-03-23 DIAGNOSIS — Z7982 Long term (current) use of aspirin: Secondary | ICD-10-CM | POA: Diagnosis not present

## 2022-03-23 DIAGNOSIS — K831 Obstruction of bile duct: Secondary | ICD-10-CM | POA: Diagnosis present

## 2022-03-23 DIAGNOSIS — Z90710 Acquired absence of both cervix and uterus: Secondary | ICD-10-CM

## 2022-03-23 DIAGNOSIS — Z87891 Personal history of nicotine dependence: Secondary | ICD-10-CM

## 2022-03-23 DIAGNOSIS — N179 Acute kidney failure, unspecified: Secondary | ICD-10-CM | POA: Diagnosis present

## 2022-03-23 DIAGNOSIS — Z79899 Other long term (current) drug therapy: Secondary | ICD-10-CM | POA: Diagnosis not present

## 2022-03-23 DIAGNOSIS — G40101 Localization-related (focal) (partial) symptomatic epilepsy and epileptic syndromes with simple partial seizures, not intractable, with status epilepticus: Secondary | ICD-10-CM | POA: Diagnosis not present

## 2022-03-23 DIAGNOSIS — E876 Hypokalemia: Secondary | ICD-10-CM | POA: Diagnosis present

## 2022-03-23 DIAGNOSIS — K5900 Constipation, unspecified: Secondary | ICD-10-CM | POA: Diagnosis present

## 2022-03-23 DIAGNOSIS — Z23 Encounter for immunization: Secondary | ICD-10-CM | POA: Diagnosis not present

## 2022-03-23 DIAGNOSIS — I10 Essential (primary) hypertension: Secondary | ICD-10-CM | POA: Diagnosis present

## 2022-03-23 DIAGNOSIS — Z85828 Personal history of other malignant neoplasm of skin: Secondary | ICD-10-CM | POA: Diagnosis not present

## 2022-03-23 DIAGNOSIS — E785 Hyperlipidemia, unspecified: Secondary | ICD-10-CM | POA: Diagnosis present

## 2022-03-23 DIAGNOSIS — C250 Malignant neoplasm of head of pancreas: Secondary | ICD-10-CM | POA: Diagnosis not present

## 2022-03-23 DIAGNOSIS — E278 Other specified disorders of adrenal gland: Secondary | ICD-10-CM | POA: Diagnosis present

## 2022-03-23 DIAGNOSIS — C259 Malignant neoplasm of pancreas, unspecified: Secondary | ICD-10-CM | POA: Diagnosis present

## 2022-03-23 DIAGNOSIS — R2689 Other abnormalities of gait and mobility: Principal | ICD-10-CM | POA: Diagnosis present

## 2022-03-23 DIAGNOSIS — G928 Other toxic encephalopathy: Secondary | ICD-10-CM | POA: Diagnosis not present

## 2022-03-23 DIAGNOSIS — E041 Nontoxic single thyroid nodule: Secondary | ICD-10-CM | POA: Diagnosis present

## 2022-03-23 LAB — COMPREHENSIVE METABOLIC PANEL
ALT: 30 U/L (ref 0–44)
AST: 28 U/L (ref 15–41)
Albumin: 2.2 g/dL — ABNORMAL LOW (ref 3.5–5.0)
Alkaline Phosphatase: 205 U/L — ABNORMAL HIGH (ref 38–126)
Anion gap: 9 (ref 5–15)
BUN: 24 mg/dL — ABNORMAL HIGH (ref 8–23)
CO2: 27 mmol/L (ref 22–32)
Calcium: 8 mg/dL — ABNORMAL LOW (ref 8.9–10.3)
Chloride: 98 mmol/L (ref 98–111)
Creatinine, Ser: 1.1 mg/dL — ABNORMAL HIGH (ref 0.44–1.00)
GFR, Estimated: 50 mL/min — ABNORMAL LOW (ref >60)
Glucose, Bld: 148 mg/dL — ABNORMAL HIGH (ref 70–99)
Potassium: 2.9 mmol/L — ABNORMAL LOW (ref 3.5–5.1)
Sodium: 134 mmol/L — ABNORMAL LOW (ref 135–145)
Total Bilirubin: 0.7 mg/dL (ref 0.3–1.2)
Total Protein: 5.2 g/dL — ABNORMAL LOW (ref 6.5–8.1)

## 2022-03-23 LAB — GLUCOSE, CAPILLARY
Glucose-Capillary: 101 mg/dL — ABNORMAL HIGH (ref 70–99)
Glucose-Capillary: 112 mg/dL — ABNORMAL HIGH (ref 70–99)
Glucose-Capillary: 114 mg/dL — ABNORMAL HIGH (ref 70–99)
Glucose-Capillary: 145 mg/dL — ABNORMAL HIGH (ref 70–99)
Glucose-Capillary: 156 mg/dL — ABNORMAL HIGH (ref 70–99)
Glucose-Capillary: 178 mg/dL — ABNORMAL HIGH (ref 70–99)
Glucose-Capillary: 68 mg/dL — ABNORMAL LOW (ref 70–99)
Glucose-Capillary: 77 mg/dL (ref 70–99)

## 2022-03-23 LAB — CBC
HCT: 24.5 % — ABNORMAL LOW (ref 36.0–46.0)
Hemoglobin: 8.1 g/dL — ABNORMAL LOW (ref 12.0–15.0)
MCH: 26.9 pg (ref 26.0–34.0)
MCHC: 33.1 g/dL (ref 30.0–36.0)
MCV: 81.4 fL (ref 80.0–100.0)
Platelets: 317 10*3/uL (ref 150–400)
RBC: 3.01 MIL/uL — ABNORMAL LOW (ref 3.87–5.11)
RDW: 15.2 % (ref 11.5–15.5)
WBC: 6.2 10*3/uL (ref 4.0–10.5)
nRBC: 0 % (ref 0.0–0.2)

## 2022-03-23 MED ORDER — ONDANSETRON HCL 4 MG PO TABS: 4.0000 mg | ORAL_TABLET | Freq: Four times a day (QID) | ORAL | Status: DC | PRN

## 2022-03-23 MED ORDER — POLYETHYLENE GLYCOL 3350 17 G PO PACK
17.0000 g | PACK | Freq: Every day | ORAL | Status: DC
  Administered 2022-03-24 – 2022-03-30 (×7): 17 g
  Filled 2022-03-23 (×8): qty 1

## 2022-03-23 MED ORDER — POTASSIUM CHLORIDE IN NACL 40-0.9 MEQ/L-% IV SOLN
INTRAVENOUS | Status: DC
  Filled 2022-03-23: qty 1000

## 2022-03-23 MED ORDER — ACETAMINOPHEN 325 MG PO TABS
650.0000 mg | ORAL_TABLET | Freq: Four times a day (QID) | ORAL | Status: DC | PRN
  Filled 2022-03-23: qty 2

## 2022-03-23 MED ORDER — ONDANSETRON HCL 4 MG/2ML IJ SOLN: 4.0000 mg | Freq: Four times a day (QID) | INTRAMUSCULAR | Status: DC | PRN

## 2022-03-23 MED ORDER — ARFORMOTEROL TARTRATE 15 MCG/2ML IN NEBU
15.0000 ug | INHALATION_SOLUTION | Freq: Two times a day (BID) | RESPIRATORY_TRACT | Status: DC
  Administered 2022-03-23 – 2022-03-30 (×12): 15 ug via RESPIRATORY_TRACT
  Filled 2022-03-23 (×19): qty 2

## 2022-03-23 MED ORDER — LEVETIRACETAM 250 MG PO TABS
750.0000 mg | ORAL_TABLET | Freq: Two times a day (BID) | ORAL | Status: DC
  Administered 2022-03-23 – 2022-03-31 (×16): 750 mg via ORAL
  Filled 2022-03-23 (×16): qty 3

## 2022-03-23 MED ORDER — PHENYTOIN SODIUM EXTENDED 300 MG PO CAPS: 300.0000 mg | ORAL_CAPSULE | Freq: Every day | ORAL | 0 refills | Status: DC

## 2022-03-23 MED ORDER — INSULIN ASPART 100 UNIT/ML IJ SOLN: 0.0000 [IU] | INTRAMUSCULAR | Status: DC

## 2022-03-23 MED ORDER — PHENYTOIN SODIUM EXTENDED 100 MG PO CAPS
300.0000 mg | ORAL_CAPSULE | Freq: Every day | ORAL | Status: DC
  Administered 2022-03-23 – 2022-03-30 (×8): 300 mg via ORAL
  Filled 2022-03-23 (×8): qty 3

## 2022-03-23 MED ORDER — INFLUENZA VAC A&B SA ADJ QUAD 0.5 ML IM PRSY
0.5000 mL | PREFILLED_SYRINGE | INTRAMUSCULAR | Status: AC
  Administered 2022-03-24: 0.5 mL via INTRAMUSCULAR
  Filled 2022-03-23: qty 0.5

## 2022-03-23 MED ORDER — ENSURE ENLIVE PO LIQD
237.0000 mL | Freq: Three times a day (TID) | ORAL | Status: DC
  Administered 2022-03-24 – 2022-03-31 (×10): 237 mL via ORAL

## 2022-03-23 MED ORDER — OXYCODONE HCL 5 MG PO TABS: 2.5000 mg | ORAL_TABLET | Freq: Three times a day (TID) | ORAL | Status: DC | PRN

## 2022-03-23 MED ORDER — ENOXAPARIN SODIUM 40 MG/0.4ML IJ SOSY
40.0000 mg | PREFILLED_SYRINGE | INTRAMUSCULAR | Status: DC
  Administered 2022-03-23 – 2022-03-30 (×8): 40 mg via SUBCUTANEOUS
  Filled 2022-03-23 (×8): qty 0.4

## 2022-03-23 MED ORDER — POTASSIUM CHLORIDE IN NACL 40-0.9 MEQ/L-% IV SOLN: 75.0000 mL/h | INTRAVENOUS | Status: DC

## 2022-03-23 MED ORDER — BUDESONIDE 0.25 MG/2ML IN SUSP
0.2500 mg | Freq: Two times a day (BID) | RESPIRATORY_TRACT | Status: DC
  Administered 2022-03-23 – 2022-03-30 (×14): 0.25 mg via RESPIRATORY_TRACT
  Filled 2022-03-23 (×17): qty 2

## 2022-03-23 MED ORDER — LEVETIRACETAM 750 MG PO TABS: 750.0000 mg | ORAL_TABLET | Freq: Two times a day (BID) | ORAL | 0 refills | Status: DC

## 2022-03-23 MED ORDER — POLYETHYLENE GLYCOL 3350 17 G PO PACK: 17.0000 g | PACK | Freq: Every day | ORAL | 0 refills | Status: DC

## 2022-03-23 MED ORDER — SENNOSIDES-DOCUSATE SODIUM 8.6-50 MG PO TABS
1.0000 | ORAL_TABLET | Freq: Two times a day (BID) | ORAL | Status: DC
  Administered 2022-03-23 – 2022-03-31 (×16): 1 via ORAL
  Filled 2022-03-23 (×16): qty 1

## 2022-03-23 MED ORDER — PNEUMOCOCCAL 20-VAL CONJ VACC 0.5 ML IM SUSY
0.5000 mL | PREFILLED_SYRINGE | INTRAMUSCULAR | Status: AC
  Administered 2022-03-24: 0.5 mL via INTRAMUSCULAR
  Filled 2022-03-23: qty 0.5

## 2022-03-23 MED ORDER — ENOXAPARIN SODIUM 40 MG/0.4ML IJ SOSY: 40.0000 mg | PREFILLED_SYRINGE | INTRAMUSCULAR | Status: DC

## 2022-03-23 NOTE — Progress Notes (Signed)
PT Cancellation Note  Patient Details Name: Shirley Nichols MRN: [id number] DOB: 03-27-1941   Cancelled Treatment:    Reason Eval/Treat Not Completed: Patient preparing for d/c to AIR, will defer treatment.  Mabeline Caras, PT, DPT Acute Rehabilitation Services  Personal: [facility] Office: [place] 03/23/2022, 1:06 PM

## 2022-03-23 NOTE — Progress Notes (Signed)
Inpatient Rehabilitation Admission Medication Review by a Pharmacist  A complete drug regimen review was completed for this patient to identify any potential clinically significant medication issues.  High Risk Drug Classes Is patient taking? Indication by Medication  Antipsychotic No   Anticoagulant Yes Lovenox for VTE prophylaxis  Antibiotic No   Opioid Yes Oxycodone prn pain  Antiplatelet No   Hypoglycemics/insulin Yes Sliding scale insulin for hyperglycemia  Vasoactive Medication No   Chemotherapy No   Other Yes Pain: Tylenol COPD: Brovana, Pulmicort Seizure: Keppra, phenytoin Nausea: Zofran Constipation: Miralax, Senokot       Type of Medication Issue Identified Description of Issue Recommendation(s)  Drug Interaction(s) (clinically significant)     Duplicate Therapy     Allergy     No Medication Administration End Date     Incorrect Dose     Additional Drug Therapy Needed     Significant med changes from prior encounter (inform family/care partners about these prior to discharge).    Other       Clinically significant medication issues were identified that warrant physician communication and completion of prescribed/recommended actions by midnight of the next day:  No   Time spent performing this drug regimen review (minutes):  20   Thank you for allowing [place] to participate in this patients care. Jens Som, PharmD 03/23/2022 3:36 PM  **Pharmacist phone directory can be found on [email or web address] listed under [place]**

## 2022-03-23 NOTE — Discharge Summary (Addendum)
Physician Discharge Summary  Shirley Nichols WUJ:[id number] DOB: 05-01-1941 DOA: 03/03/2022  PCP: Remi Haggard, FNP  Admit date: 03/03/2022 Discharge date: 03/23/2022  Time spent: 33 minutes  Recommendations for Outpatient Follow-up:  May requires adjustment of antiepileptic meds-we will be forwarding this note to neurology for opinion Continue replacement of potassium with IV fluid and intravenous potassium replacement get magnesium etc. in the morning Would need outpatient follow-up with oncology in [place] as patient lives in that area for evaluation of pancreatic mass which is likely cancer  Discharge Diagnoses:  MAIN problem for hospitalization   Jaundice secondary to pancreatic obstructing mass status post stent AKI Seizure as well as focal status requiring intubation Toxic metabolic encephalopathy secondary to multiple reasons  Please see below for itemized issues addressed in [place]- refer to other progress notes for clarity if needed  Discharge Condition: Somewhat improved  Diet recommendation: Regular  Filed Weights   03/18/22 0426 03/21/22 0500 03/23/22 0300  Weight: 69.7 kg 69.7 kg 84.8 kg    History of present illness:   81 y.o. F with HTN, HLD who presented with hyperbilirubinemia and renal failure.   Developed generalized weakness in the last few weeks, went to PCP, found to have jaundice, elevated LFTs and AKI, sent to the ER.     10/6: Admitted, MRCP shows masslike density in the head of pancreas with biliary obstruction 10/7: GI consulted 10/8: ERCP with stent placement 10/9: New post-ERCP pancreatitis, Cr back up 10/10: Pancreatitis resolved, Cr improving on fluids 10/13: found to have delirum and unresponsiveness, transferred to ICU. While in ICU, had focal epileptic seizures + focal status, requiring intubation. Neurology followed for EEG. 10/16 lumbar puncture negative for any infection 10/17 bronchoscopy 10/19 pt exutabated  10/20 GI  signed off his LFTs dropped and it appears that stent was working no need for ERCP    Hospital Course:  Assessment and Plan: Acute toxic metabolic encephalopathy with Status epilepticus -Per neurology keppra 742m bid with dilantin 304mqhs--as needed Ativan 2 mg every 4 as needed seizure--this is been held on discharge -We will ask that neurology follow patient and did d/w Dr. 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 Appreciate Oncology input on 10/12. Recs for chemotherapy as outpatient--this [place] occur [place] -Appreciate input from Dr. GoAlvy Bimlerwho will help arrange oncology near pt[place] home in [place]   Normocytic anemia -hemodynamically stable -Cont to follow cbc trends   ARF -Cr slightly increased on 10/26 but should resolve with IV fluid that was started at 75 cc/H recommend labs in the a.m.   Hypokalemia -will continue replacement with 40 of K and 75 cc/H saline-get magnesium in the morning   Discharge Exam: Vitals:   03/23/22 0757 03/23/22 0908  BP: (!) 144/59   Pulse: 76   Resp: 16   Temp: 98 F (36.7 C)   SpO2: 93% 98%    Subj on day of d/c   Awake  slight confusion but no distress and did have some breakfast including a shake No fever no abdominal pain  General Exam on discharge  EOMI NCAT no focal deficit no icterus no pallor Chest clear no added sound rales rhonchi wheeze CTA B no added  cell ROM intact no focal deficit Abdomen soft nontender no rebound no guarding Moving 4 limbs equally  Discharge Instructions   Discharge Instructions     Diet - low sodium heart healthy   Complete by: As directed    Increase activity slowly   Complete by: As directed       Allergies as of 03/23/2022   No Known Allergies      Medication List     STOP taking these medications    valsartan-hydrochlorothiazide 320-25 MG tablet Commonly known as: DIOVAN-HCT       TAKE these medications    0.9 % NaCl with KCl 40 mEq / L 40-0.9 MEQ/L-% Inject 75 mL/hr into the vein continuous.   aspirin EC 81 MG tablet Take 81 mg by mouth daily.   ezetimibe 10 MG tablet Commonly known as: ZETIA Take 10 mg by mouth daily.   fluvastatin XL 80 MG 24 hr tablet Commonly known as: LESCOL XL Take 80 mg by mouth daily.   levETIRAcetam 750 MG tablet Commonly known as: KEPPRA Take 1 tablet (750 mg total) by mouth 2 (two) times daily.   phenytoin 300 MG ER capsule Commonly known as: DILANTIN Take 1 capsule (300 mg total) by mouth at bedtime.   polyethylene glycol 17 g packet Commonly known as: MIRALAX / GLYCOLAX Place 17 g into feeding tube daily. Start taking on: March 24, 2022   Symbicort 160-4.5 MCG/ACT inhaler Generic drug: budesonide-formoterol Inhale 2 puffs into the lungs as needed (for wheezing or shortness of breath).       No Known Allergies  Follow-up Information     Remi Haggard, FNP. Schedule an appointment as soon as possible for a visit.   Specialty: Family Medicine Contact information: [place] [phone]         [facility] Follow up.   Specialty: Home Health Services Why: [facility] will be providing home health physical therapy and occupational therapy.  They will call you within 24-48 hours of your discharge to home to set up services. Contact information: [place] [address] [phone]         Dwan Bolt, MD. Go on 04/04/2022.   Specialty: General Surgery Why: at 11:00 AM for follow up with a surgeon. please arrive by 10:30 AM. Contact information: [place] [address] [phone]                  The results of significant diagnostics from this hospitalization (including imaging, microbiology, ancillary and laboratory) are listed below for reference.    Significant Diagnostic Studies: Overnight EEG with video  Result Date: 03/16/2022 Lora Havens, MD     03/17/2022  6:39 AM Patient Name: ORLI DEGRAVE MRN: [id number] Epilepsy Attending: Lora Havens Referring Physician/Provider: Katy Apo, NP Duration: 03/15/2022 0920 to 03/16/2022 0920  Patient history: 81yo F with right leg tremoring after the MRI and they also noted that she was talking gibberish all afternoon for 4 to 5 hours. At approximately 1830, she was noted to have right arm and leg jerking with left gaze deviation. EEG to evaluate for seizure  Level of alertness: lethargic  AEDs during EEG study: LEV, PHT, Propofol  Technical aspects: This EEG study was done with scalp electrodes positioned according to the 10-20 International system of electrode placement. Electrical activity was reviewed with band pass filter of 1-_0 , sensitivity of 7 uV/mm, display speed of 80m/sec with a _1  notched filter applied as appropriate. EEG data were recorded continuously and digitally stored.  Video monitoring was available and reviewed as appropriate.  Description: EEG showed continuous generalized 3 to 6 Hz theta-delta slowing. Hyperventilation and photic stimulation were not performed.    Event button was pressed on 03/15/2022 at 1123, 1125, 1142, 1213,1218,1233, [id number] right lower extremity rhythmic twitching. Concomitant EEG before, during and after the event did not show any EEG change.  ABNORMALITY - Continuous  slow, generalized  IMPRESSION: This study is suggestive of moderate to severe diffuse encephalopathy, nonspecific etiology. No epileptiform discharges were seen throughout the recording.  Event button was pressed on 03/15/2022 at 1123, 1125, 1142, [phone] [id number] right lower extremity rhythmic twitching without concomitant EEG change. However, focal motor seizures may not be seen on scalp EEG. Therefore clinical correlation is recommended.    PLora Havens  DG Abd Portable 1V  Result Date: 03/15/2022 CLINICAL DATA:  Evaluate location of tip of enteric tube EXAM: PORTABLE ABDOMEN - 1 VIEW COMPARISON:  Previous studies done earlier today FINDINGS: There is slight interval withdrawal of the enteric tube. Tip and side port are still within the fundus of the stomach. Bowel gas pattern is nonspecific. Heart is enlarged in size. Bilateral pleural effusions are seen. IMPRESSION: Tip and side port of enteric tube are noted in the medial aspect of fundus of the stomach. Electronically Signed   By: PElmer PickerM.D.   On: 03/15/2022 14:30   DG Abd Portable 1V  Result Date: 03/15/2022 CLINICAL DATA:  Evaluate location of enteric tube EXAM: PORTABLE ABDOMEN - 1 VIEW COMPARISON:  Previous study done earlier today FINDINGS: There is interval repositioning of enteric tube with its tip in the fundus of the stomach. Biliary stent is seen in the course of distal common bile duct. Bowel gas pattern is nonspecific. IMPRESSION: Tip of enteric tube is seen in the fundus of the stomach. Electronically Signed   By: PElmer PickerM.D.   On: 03/15/2022 14:29   DG Abd Portable 1V  Result Date: 03/15/2022 CLINICAL DATA:  OG tube placement. EXAM: PORTABLE ABDOMEN - 1 VIEW COMPARISON:  One-view chest x-ray 03/14/2022 FINDINGS: Biliary stent is in place.  Bowel gas pattern is unremarkable. NG tube is coiled in the stomach. The tip is directed cephalad in the distal esophagus. The portion directed  upward in the esophagus has increased since the previous exam. Bilateral pleural effusions and basilar airspace disease noted. IMPRESSION: 1. NG tube is coiled in the stomach. The tip is directed cephalad in the distal esophagus. 2. Biliary stent in place. Electronically Signed   By: CSan MorelleM.D.   On: 03/15/2022 08:47   DG CHEST PORT 1 VIEW  Result Date: 03/14/2022 CLINICAL DATA:  Endotracheal tube EXAM: PORTABLE CHEST 1 VIEW COMPARISON:  Earlier today at 2:02 p.m. FINDINGS: 4:45 p.m. Support apparatus: Endotracheal tube terminates 4.6 cm above carina. Nasogastric tube placed in the interval, looped in the stomach with tip at distal esophagus. Right PICC line tip at superior caval/atrial junction. Heart/mediastinum: Midline trachea.  Moderate cardiomegaly Pleura: Small bilateral pleural effusions are similar. No pneumothorax. Lungs: Left-greater-than-right base airspace disease is not significantly  changed.Moderate interstitial edema is increased. Other: Numerous leads and wires project over the chest. IMPRESSION: Appropriate position of endotracheal tube. Nasogastric tube looped in the stomach with tip at the distal esophagus. Similar congestive heart failure with layering bilateral pleural effusions and bibasilar airspace disease. These results will be called to the ordering clinician or representative by the Radiologist Assistant, and communication documented in the PACS or [employer]. Electronically Signed   By: Abigail Miyamoto M.D.   On: 03/14/2022 17:11   DG Abd Portable 1V  Result Date: 03/14/2022 CLINICAL DATA:  Abnormal liver function tests. Evaluate biliary stent EXAM: PORTABLE ABDOMEN - 1 VIEW COMPARISON:  CT chest done on 03/11/2022 FINDINGS: Bowel gas pattern is nonspecific. No abnormal masses or calcifications are seen. Increased density in left lower lung fields may suggest pleural effusion. There is a stent in right side of abdomen. The stent is vertically oriented,  possibly positioned in the distal common bile duct and duodenum. IMPRESSION: Nonspecific bowel gas pattern. A biliary stent is noted in right side of abdomen. The stent is more vertically oriented than usual. This may suggest presence of stent in the distal common bile duct and duodenum. Electronically Signed   By: Elmer Picker M.D.   On: 03/14/2022 15:48   DG Chest Port 1 View  Result Date: 03/14/2022 CLINICAL DATA:  Pulmonary aspiration.  Seizure. EXAM: PORTABLE CHEST 1 VIEW COMPARISON:  CT chest 03/11/2022 and chest radiograph 03/08/2022 FINDINGS: Hazy opacities at the lung bases compatible with lower lobe atelectasis or pneumonia likely superimposed on pleural effusions which were better shown on the 03/11/2022 exam. Right PICC line tip: SVC. Mild enlargement of the cardiopericardial silhouette. Indistinct pulmonary vasculature suggesting pulmonary venous hypertension. IMPRESSION: 1. Hazy opacities at the lung bases compatible with lower lobe atelectasis or pneumonia likely superimposed on pleural effusions. 2. Mild enlargement of the cardiopericardial silhouette with indistinct pulmonary vasculature suggesting pulmonary venous hypertension. Electronically Signed   By: Van Clines M.D.   On: 03/14/2022 14:31   DG FL GUIDED LUMBAR PUNCTURE  Result Date: 03/13/2022 CLINICAL DATA:  Acute encephalopathy with focal seizures. Radiology was consulted to perform diagnostic lumbar puncture under fluoroscopic guidance EXAM: DIAGNOSTIC LUMBAR PUNCTURE UNDER FLUOROSCOPIC GUIDANCE COMPARISON:  Head CT 03/10/2022 and abdominal CT 03/03/2022 were reviewed. FLUOROSCOPY: Radiation Exposure Index (as provided by the fluoroscopic device): 14.40 mGy Kerma PROCEDURE: Informed consent was obtained from the patient prior to the procedure, including potential complications of headache, allergy, and pain. With the patient prone, the lower back was prepped with Betadine. 1% Lidocaine was used for local anesthesia.  Lumbar puncture was performed at the L5-S1 level using a 20 gauge needle with return of clear CSF with an opening pressure of 36 cm water. 15 ml of CSF were obtained for laboratory studies. Closing pressure of 18 cm water. The patient tolerated the procedure well and there were no apparent complications. Procedure performed by Reatha Armour, PA under the supervision of Richardean Sale, MD. IMPRESSION: 1.  Technically successful lumbar puncture at L5-S1 level 2.  Opening pressure 36 cm water, closing pressure 18 cm water 3.  15 mL of clear CSF obtained for laboratory study 4.  Patient tolerated procedure well with no apparent complications Read by: Reatha Armour, PA-C Electronically Signed   By: Richardean Sale M.D.   On: 03/13/2022 15:17   Overnight EEG with video  Result Date: 03/13/2022 Lora Havens, MD     03/14/2022  9:20 AM Patient Name: RUTHEL MARTINE MRN: [id number] Epilepsy  Attending: Lora Havens Referring Physician/Provider: Katy Apo, NP Duration: 03/12/2022 1721 to 03/13/2022 1721  Patient history: 81yo F with right leg tremoring after the MRI and they also noted that she was talking gibberish all afternoon for 4 to 5 hours. At approximately 1830, she was noted to have right arm and leg jerking with left gaze deviation. EEG to evaluate for seizure  Level of alertness: Awake, asleep  AEDs during EEG study: LEV  Technical aspects: This EEG study was done with scalp electrodes positioned according to the 10-20 International system of electrode placement. Electrical activity was reviewed with band pass filter of 1-_0 , sensitivity of 7 uV/mm, display speed of 68m/sec with a _1  notched filter applied as appropriate. EEG data were recorded continuously and digitally stored.  Video monitoring was available and reviewed as appropriate.  Description: No clear posterior dominant rhythm was seen. Sleep was characterized by vertex waves, sleep spindles (12 to 14 Hz), maximal  frontocentral region. EEG showed continuous generalized and lateralized left hemisphere 3 to 6 Hz theta-delta slowing with overriding 12-_2  generalized beta activity. Hyperventilation and photic stimulation were not performed.   Event button was pressed on 03/13/2022 at 0958 for right lower extremity rhythmic twitching.  Concomitant EEG before, during and after the event did not show any EEG change.  ABNORMALITY - Continuous slow, generalized and lateralized left hemisphere  IMPRESSION: This study is suggestive of cortical dysfunction arising from left hemisphere likely secondary to underlying structural abnormality, post-ictal state. Additionally there is moderate to severe diffuse encephalopathy, nonspecific etiology. No epileptiform discharges were seen throughout the recording. Event button was pressed on 03/13/2022 at 0958 for rhythmic right lower extremity twitching without concomitant EEG change.  However, focal motor seizures may not be seen on scalp EEG.  Therefore clinical correlation is recommended.  Priyanka O Yadav   [place]KG SITE RITE  Result Date: 03/12/2022 If Site Rite image not attached, placement could not be confirmed due to current cardiac rhythm.  CT CHEST W CONTRAST  Result Date: 03/11/2022 CLINICAL DATA:  Pancreatic cancer. EXAM: CT CHEST WITH CONTRAST TECHNIQUE: Multidetector CT imaging of the chest was performed during intravenous contrast administration. RADIATION DOSE REDUCTION: This exam was performed according to the departmental dose-optimization program which includes automated exposure control, adjustment of the mA and/or kV according to patient size and/or use of iterative reconstruction technique. CONTRAST:  564mOMNIPAQUE IOHEXOL 350 MG/ML SOLN COMPARISON:  None Available. FINDINGS: Cardiovascular: The heart size is normal. No substantial pericardial effusion. Mild atherosclerotic calcification is noted in the wall of the thoracic aorta. Mediastinum/Nodes: 2.1 cm right  thyroid nodule evident. Small mediastinal and right hilar lymph nodes evident. The esophagus has normal imaging features. There is no axillary lymphadenopathy. Lungs/Pleura: Collapse/consolidative opacity is seen in both lower lobes with small to moderate bilateral pleural effusions. Mild interlobular septal thickening noted in the upper lobes bilaterally. No overtly suspicious pulmonary nodule or mass. Upper Abdomen: 1.8 cm hypervascular subcapsular lesion seen in the dome of the liver (image 107/3). This was not visible on MRI of 03/03/2022. Pneumobilia evident. Left adrenal nodule is similar to previous MRI. Biliary stent device is been incompletely visualized. Musculoskeletal: No worrisome lytic or sclerotic osseous abnormality. IMPRESSION: 1. Collapse/consolidative opacity in both lower lobes with small to moderate bilateral pleural effusions. Imaging features are compatible with compressive atelectasis although superimposed pneumonia not excluded. 2. 1.8 cm hypervascular subcapsular lesion in the dome of the liver. This was not visible on MRI of 03/03/2022. This  may be related to perfusion anomaly, but given the history of pancreatic cancer, close follow-up recommended. 3. 2.1 cm right thyroid nodule. In the setting of significant comorbidities or limited life expectancy, no follow-up recommended (ref: J Am Coll Radiol. 2015 Feb;12(2): 143-50). 4. Small mediastinal and right hilar lymph nodes, likely reactive. 5. Pneumobilia. Biliary stent device is been incompletely visualized. 6. Stable left adrenal nodule. This was incompletely characterized on previous MRI but felt favor a benign adrenal adenoma. 7.  Aortic Atherosclerosis (ICD10-I70.0). Electronically Signed   By: Misty Stanley M.D.   On: 03/11/2022 15:25   Overnight EEG with video  Result Date: 03/11/2022 Lora Havens, MD     03/11/2022 12:33 PM Patient Name: BRIANNAH LONA MRN: [id number] Epilepsy Attending: Lora Havens Referring  Physician/Provider: Katy Apo, NP Duration: 03/10/2022 1920 to 03/11/2022 1030 Patient history: 81yo F with right leg tremoring after the MRI and they also noted that she was talking gibberish all afternoon for 4 to 5 hours. At approximately 1830, she was noted to have right arm and leg jerking with left gaze deviation. EEG to evaluate for seizure Level of alertness: Awake, asleep AEDs during EEG study: LEV Technical aspects: This EEG study was done with scalp electrodes positioned according to the 10-20 International system of electrode placement. Electrical activity was reviewed with band pass filter of 1-_0 , sensitivity of 7 uV/mm, display speed of 39m/sec with a _1  notched filter applied as appropriate. EEG data were recorded continuously and digitally stored.  Video monitoring was available and reviewed as appropriate. Description: No clear posterior dominant rhythm was seen. Sleep was characterized by vertex waves, sleep spindles (12 to 14 Hz), maximal frontocentral region. EEG showed continuous generalized and lateralized left hemisphere 3 to 6 Hz theta-delta slowing with overriding 12-_2  generalized beta activity. Hyperventilation and photic stimulation were not performed.   ABNORMALITY - Continuous slow, generalized and lateralized left hemisphere IMPRESSION: This study is suggestive of cortical dysfunction arising from left hemisphere likely secondary to underlying structural abnormality, post-ictal state. Additionally there is moderate to severe diffuse encephalopathy, nonspecific etiology. No seizures or epileptiform discharges were seen throughout the recording. PLora Havens  CT HEAD CODE STROKE WO CONTRAST  Result Date: 03/10/2022 CLINICAL DATA:  Code stroke. EXAM: CT HEAD WITHOUT CONTRAST TECHNIQUE: Contiguous axial images were obtained from the base of the skull through the vertex without intravenous contrast. RADIATION DOSE REDUCTION: This exam was performed according  to the departmental dose-optimization program which includes automated exposure control, adjustment of the mA and/or kV according to patient size and/or use of iterative reconstruction technique. COMPARISON:  No prior head CT, correlation is made with same day MRI head FINDINGS: Brain: No evidence of acute infarction, hemorrhage, cerebral edema, mass, mass effect, or midline shift. No hydrocephalus or extra-axial collection. Cerebral volume is within normal limits for age. Vascular: No hyperdense vessel. Skull: Negative for fracture or focal lesion. Sinuses/Orbits: Mucous retention cyst in the left maxillary sinus. Mucosal thickening in the ethmoid air cells and left frontal sinus. Status post bilateral lens replacements. Other: The mastoid air cells are well aerated. ASPECTS ([facility]roke Program Early CT Score) - Ganglionic level infarction (caudate, lentiform nuclei, internal capsule, insula, M1-M3 cortex): 7 - Supraganglionic infarction (M4-M6 cortex): 3 Total score (0-10 with 10 being normal): 10 IMPRESSION: 1. No acute intracranial process. 2. ASPECTS is 10. Code stroke imaging results were communicated on 03/10/2022 at 6:57 pm to provider BHAGAT via secure text paging. Electronically Signed  By: Merilyn Baba M.D.   On: 03/10/2022 18:57   MR BRAIN W WO CONTRAST  Result Date: 03/10/2022 CLINICAL DATA:  81 year old female with confusion, TIA. Jaundice, suspected pancreatic head mass. EXAM: MRI HEAD WITHOUT AND WITH CONTRAST TECHNIQUE: Multiplanar, multiecho pulse sequences of the brain and surrounding structures were obtained without and with intravenous contrast. CONTRAST:  3m GADAVIST GADOBUTROL 1 MMOL/ML IV SOLN COMPARISON:  None Available. FINDINGS: Brain: Cerebral volume is within normal limits for age. No restricted diffusion to suggest acute infarction. No midline shift, mass effect, evidence of mass lesion, ventriculomegaly, extra-axial collection or acute intracranial hemorrhage.  Cervicomedullary junction and pituitary are within normal limits. 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 Otherwise mild for age patchy nonspecific white matter T2 and FLAIR hyperintensity (series 6, image 23). Occasional subtle chronic microhemorrhages in the brain (left frontal lobe series 7, image 58). Mild T2 heterogeneity in the bilateral basal ganglia and right thalamus. No cortical encephalomalacia. No abnormal enhancement identified. No dural thickening suspected on this 3 Tesla imaging. Vascular: Major intracranial vascular flow voids are preserved. The major dural venous sinuses are enhancing and appear to be patent. Skull and upper cervical spine: Negative for age visible cervical spine. Visualized bone marrow signal is within normal limits. Sinuses/Orbits: Postoperative changes to both globes. Paranasal sinuses and mastoids are well aerated. Other: Grossly normal visible internal auditory structures. Negative visible scalp and face. IMPRESSION: 1. No metastatic disease or acute intracranial abnormality. 2. 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 Electronically Signed   By: HGenevie AnnM.D.   On: 03/10/2022 12:50   DG CHEST PORT 1 VIEW  Result Date: 03/08/2022 CLINICAL DATA:  Acute encephalopathy.  Confusion. EXAM: PORTABLE CHEST 1 VIEW COMPARISON:  None Available. FINDINGS: The cardiac silhouette, mediastinal and hilar contours are within normal limits given the AP projection and portable technique. Asymmetric reticulonodular interstitial lung pattern more pronounced on the right. Findings could be due to chronic interstitial process or atypical/viral pneumonia or MAC. No focal pulmonary infiltrates or pleural effusions. No pulmonary lesions. IMPRESSION: Asymmetric reticulonodular interstitial lung pattern could be due to chronic interstitial process or atypical/viral pneumonia or MAC. Electronically Signed   By: PMarijo SanesM.D.   On:  03/08/2022 08:32   DG ERCP  Result Date: 03/06/2022 CLINICAL DATA:  Pancreatic mass, malignant biliary obstruction EXAM: ERCP TECHNIQUE: Multiple spot images obtained with the fluoroscopic device and submitted for interpretation post-procedure. FLUOROSCOPY: Radiation Exposure Index (as provided by the fluoroscopic device): 74.051 mGy Kerma COMPARISON:  None Available. FINDINGS: Six fluoroscopic intraoperative radiographs are presented for interpretation postprocedurally. Initial image demonstrates wire cannulation of the pancreatic duct and subsequent retrograde wire cannulation of the common duct. Contrast installation demonstrates marked extrahepatic biliary ductal dilation with a roughly 4-5 cm stricture involving the distal common duct. Subsequent images demonstrate palliative biliary stenting of the distal extrahepatic duct with partial evacuation of biliary contrast. The intrahepatic biliary tree is not well opacified on this examination. No extraluminal extension of contrast identified. 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 Subsequent palliative biliary stenting of the stricture. These images were submitted for radiologic interpretation only. Please see the procedural report for the amount of contrast and the fluoroscopy time utilized. Electronically Signed   By: AFidela SalisburyM.D.   On: 03/06/2022 04:17   DG C-Arm 1-60 Min-No Report  Result Date: 03/05/2022  Fluoroscopy was utilized by the requesting physician.  No radiographic interpretation.   DG C-Arm 1-60 Min-No Report  Result Date: 03/05/2022 Fluoroscopy was utilized by the requesting physician.  No radiographic interpretation.   US RENAL  Result Date: 03/04/2022 CLINICAL DATA:  Renal dysfunction EXAM: RENAL / URINARY TRACT ULTRASOUND COMPLETE COMPARISON:  CT done on 03/03/2022 FINDINGS: Right Kidney: Renal  measurements: 10 x 4.5 x 5.4 cm = volume: 127.2 mL. There is no hydronephrosis. There is a 1.7 cm cyst in the medial aspect of right kidney. There is trace amount of free fluid adjacent to the right kidney. Left Kidney: Renal measurements: 11.5 x 6.2 x 5.2 cm = volume: 193 x 9 mL. There is no hydronephrosis. Technologist observed a 2.6 cm structure with focal bulge in the cortical margin in the midportion of left kidney. In MRI done on 03/03/2022, no definite significant focal abnormalities were noted in the left kidney. Bladder: Appears normal for degree of bladder distention. Other: None. IMPRESSION: There is no hydronephrosis. Minimal right perinephric fluid. Right renal cyst. There is focal bulge in cortical margin in the midportion of left kidney, possibly partial volume averaging artifact. In previous MR done on 03/03/2022, no significant focal left renal lesion was seen. Electronically Signed   By: Elmer Picker M.D.   On: 03/04/2022 14:44   MR ABDOMEN MRCP W WO CONTAST  Result Date: 03/03/2022 CLINICAL DATA:  Jaundice. Pancreatitis. Biliary ductal dilatation. Evaluate for choledocholithiasis or pancreatic mass. EXAM: MRI ABDOMEN WITHOUT AND WITH CONTRAST (INCLUDING MRCP) TECHNIQUE: Multiplanar multisequence MR imaging of the abdomen was performed both before and after the administration of intravenous contrast. Heavily T2-weighted images of the biliary and pancreatic ducts were obtained, and three-dimensional MRCP images were rendered by post processing. CONTRAST:  73m GADAVIST GADOBUTROL 1 MMOL/ML IV SOLN COMPARISON:  Noncontrast CT on 03/03/2022 FINDINGS: Lower chest: No acute findings. Hepatobiliary: Image degradation by motion artifact noted. No hepatic masses identified. Gallbladder is distended, however there are no other findings of acute cholecystitis. Diffuse biliary ductal dilatation is seen, measuring 15 mm in diameter. Stricture of the distal common bile duct is seen in the pancreatic  head. No evidence of choledocholithiasis. Pancreas: Image degradation by motion artifact noted. Mild ductal dilatation is seen in the pancreatic tail and body. A heterogeneously enhancing masslike density is seen in the pancreatic head, measuring 3.4 x 2.8 cm, highly suspicious for pancreatic carcinoma. Spleen:  Within normal limits in size and appearance. Adrenals/Urinary Tract: A 2.6 x 2.3 cm left adrenal mass is seen, with evaluation limited by motion artifact. This appears homogeneous with mild T2 hypointensity, favoring a benign adrenal adenoma. No suspicious renal masses identified. No evidence of hydronephrosis. Stomach/Bowel: Unremarkable. Vascular/Lymphatic: No pathologically enlarged lymph nodes identified. No acute vascular findings. Other:  None. Musculoskeletal:  No suspicious bone lesions identified. IMPRESSION: Image degradation by motion artifact noted. 3.4 cm masslike density in the pancreatic head, highly suspicious for pancreatic carcinoma. 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 No evidence of choledocholithiasis. 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 Electronically Signed   By: JMarlaine HindM.D.   On: 03/03/2022 19:05   CT Abdomen Pelvis Wo Contrast  Result Date: 03/03/2022 CLINICAL DATA:  Elevated lipase; distended gallbladder and CBD on ultrasound earlier today; pancreatitis suspected with concern for obstructing mass EXAM: CT ABDOMEN AND PELVIS WITHOUT CONTRAST TECHNIQUE: Multidetector CT imaging of the abdomen and pelvis was performed  following the standard protocol without IV contrast. RADIATION DOSE REDUCTION: This exam was performed according to the departmental dose-optimization program which includes automated exposure control, adjustment of the mA and/or kV according to patient size and/or use of iterative reconstruction technique. COMPARISON:  Ultrasound earlier today  FINDINGS: Lower chest: Coronary artery calcification.  No acute abnormality. Hepatobiliary: Distended gallbladder and common bile duct which measures approximately 11 mm in greatest dimension. No radiopaque Boyar. No suspicious hepatic lesion on noncontrast exam. Pancreas: No definite pancreatic duct dilation. There is mild haziness in the peripancreatic fat about the pancreatic head and uncinate process (series 2/image [id number]). Spleen: Normal in size without focal abnormality. Adrenals/Urinary Tract: Nodular low-density (< 10 HU) thickening of the left adrenal gland. Symmetric perinephric stranding. Bilateral focal cortical renal scarring. Low-attenuation lesions in the kidneys are statistically likely to represent cysts. No follow-up is required. Stomach/Bowel: Unremarkable stomach. Normal caliber large and small bowel. The appendix is not visualized. Vascular/Lymphatic: Aortic atherosclerosis. No enlarged abdominal or pelvic lymph nodes. Reproductive: Hysterectomy. Other: No free intraperitoneal fluid or air. Musculoskeletal: Degenerative changes pubic symphysis, both hips, SI joints and lower lumbar spine. Anterolisthesis L4 on L5. No acute osseous abnormality. IMPRESSION: Hazy stranding about the pancreatic head suggestive of pancreatitis. This is incompletely evaluated without IV contrast. Dilated gallbladder and common bile duct. MRI/MRCP is recommended to evaluate for choledocholithiasis or obstructing mass. Electronically Signed   By: Placido Sou M.D.   On: 03/03/2022 14:33   US Abdomen Limited RUQ (LIVER/GB)  Result Date: 03/03/2022 CLINICAL DATA:  Transaminitis EXAM: ULTRASOUND ABDOMEN LIMITED RIGHT UPPER QUADRANT COMPARISON:  None Available. FINDINGS: Gallbladder: There is layering sludge in the distended gallbladder lumen but no shadowing stones. There is no gallbladder wall thickening or pericholecystic fluid. Sonographic Murphy's sign was reported negative. Common bile duct: Diameter: 1 cm.   There is intrahepatic biliary ductal dilatation. Liver: No focal lesion identified. Within normal limits in parenchymal echogenicity. Portal vein is patent on color Doppler imaging with normal direction of blood flow towards the liver. Other: None. IMPRESSION: 1. Distended gallbladder with layering sludge but no shadowing stones or other evidence of acute cholecystitis. 2. Intra and extrahepatic biliary ductal dilatation. This may be due to choledocholithiasis or an obstructing mass. Recommend MRI/MRCP for further evaluation. Electronically Signed   By: Valetta Mole M.D.   On: 03/03/2022 13:22    Microbiology: Recent Results (from the past 240 hour(s))  CSF culture w Gram Stain     Status: None   Collection Time: 03/13/22  2:51 PM   Specimen: PATH Cytology CSF; Cerebrospinal Fluid  Result Value Ref Range Status   Specimen Description CSF  Final   Special Requests NONE  Final   Gram Stain NO WBC SEEN NO ORGANISMS SEEN CYTOSPIN SMEAR   Final   Culture   Final    NO GROWTH Performed at [facility], [address] [place], [place] [address]    Report Status 03/17/2022 FINAL  Final  Fungus Culture With Stain     Status: None (Preliminary result)   Collection Time: 03/13/22  2:51 PM   Specimen: PATH Cytology CSF; Cerebrospinal Fluid  Result Value Ref Range Status   Fungus Stain Final report  Final    Comment: (NOTE) Performed At: [facility] [address] [place], [place] [id number] Rush Farmer MD [id number]    Fungus (Mycology) Culture PENDING  Incomplete   Fungal Source CSF  Final    Comment: Performed at [facility], [address] [place], [place] [address]  Anaerobic culture w Gram Stain     Status: None   Collection Time: 03/13/22  2:51 PM   Specimen: PATH Cytology CSF; Cerebrospinal Fluid  Result Value Ref Range Status   Specimen Description CSF  Final   Special Requests NONE  Final   Gram Stain NO WBC SEEN NO ORGANISMS SEEN CYTOSPIN SMEAR    Final   Culture   Final    NO ANAEROBES ISOLATED Performed at [facility], [place] [address] [place], [place] [address]    Report Status 03/18/2022 FINAL  Final  Fungus Culture Result     Status: None   Collection Time: 03/13/22  2:51 PM  Result Value Ref Range Status   Result 1 Comment  Final    Comment: (NOTE) KOH/Calcofluor preparation:  no fungus observed. Performed At: [address] [facility] [place], [place] [id number] Rush Farmer MD [id number]   Culture, Respiratory w Gram Stain     Status: None   Collection Time: 03/14/22  4:35 PM   Specimen: Bronchoalveolar Lavage; Respiratory  Result Value Ref Range Status   Specimen Description BRONCHIAL ALVEOLAR LAVAGE  Final   Special Requests NONE  Final   Gram Stain   Final    RARE WBC PRESENT,BOTH PMN AND MONONUCLEAR NO ORGANISMS SEEN    Culture   Final    NO GROWTH Performed at [facility], [address], [place], [place] [address]    Report Status 03/17/2022 FINAL  Final     Labs: Basic Metabolic Panel: Recent Labs  Lab 03/16/22 1638 03/17/22 0312 03/17/22 2237 03/18/22 0200 03/19/22 0505 03/20/22 0400 03/21/22 0500 03/22/22 0535 03/23/22 0245  NA  --  137  --    < > 138 137 138 138 134*  K  --  3.0*  --    < > 2.8* 3.8 3.2* 3.4* 2.9*  CL  --  100  --    < > 99 99 101 102 98  CO2  --  27  --    < > _0 GLUCOSE  --  92  --    < > 84 118* 117* 106* 148*  BUN  --  12  --    < > _1 24*  CREATININE  --  1.42*  --    < > 1.13* 1.13* 1.07* 1.18* 1.10*  CALCIUM  --  8.1*  --    < > 8.2* 7.9* 8.2* 8.3* 8.0*  MG 2.0 1.9 1.7  --  1.4* 1.9  --   --   --   PHOS 3.6 4.2 2.6  --   --   --   --   --   --    < > = values in this interval not displayed.   Liver Function Tests: Recent Labs  Lab 03/19/22 0505 03/20/22 0400 03/21/22 0500 03/22/22 0535 03/23/22 0245  AST _2 ALT 46* 42 35 32 30  ALKPHOS 301* 289* 250* 223* 205*  BILITOT 1.0 0.7 0.7 0.8 0.7   PROT 4.9* 5.1* 5.3* 5.2* 5.2*  ALBUMIN 1.9* 1.9* 2.1* 2.1* 2.2*   No results for input(s): "LIPASE", "AMYLASE" in the last 168 hours. No results for input(s): "AMMONIA" in the last 168 hours. CBC: Recent Labs  Lab 03/18/22 0200 03/19/22 0505 03/20/22 0400 03/21/22 0500 03/22/22 0535 03/23/22 0245  WBC 7.9 7.5 7.4 6.9 6.6 6.2  NEUTROABS 5.9  --   --   --   --   --   HGB 8.1* 8.5* 8.6* 8.7* 8.1* 8.1*  HCT 24.6* 26.3* 27.3* 26.7* 25.9* 24.5*  MCV 80.4 82.2 83.2 81.9 83.0 81.4  PLT 317 343 353 324 321 317   Cardiac Enzymes: No results for input(s): "CKTOTAL", "CKMB", "CKMBINDEX", "TROPONINI" in the last 168 hours. BNP: BNP (last 3 results) No results for input(s): "BNP" in the last 8760 hours.  ProBNP (last 3 results) No results for input(s): "PROBNP" in the last 8760 hours.  CBG: Recent Labs  Lab 03/22/22 1616 03/22/22 2144 03/23/22 0022 03/23/22 0521 03/23/22 0753  GLUCAP 134* 123* 178* 68* 156*       Signed:  Nita Sells MD   [facility] 03/23/2022, 11:07 AM

## 2022-03-23 NOTE — Progress Notes (Signed)
Mobility Specialist Progress Note:   03/23/22 1100  Mobility  Activity Ambulated with assistance in hallway (5 STS)  Level of Assistance Contact guard assist, steadying assist  Assistive Device  (HHA)  Distance Ambulated (ft) 140 ft  Activity Response Tolerated well  $Mobility charge 1 Mobility   Pt received in bed willing to participate in mobility. No complaints of pain. Left in bed with call bell in reach and all needs met.   [facility] Surveyor, mining Chat only

## 2022-03-23 NOTE — TOC Transition Note (Addendum)
Transition of Care (TOC) - CM/SW Discharge Note Marvetta Gibbons RN, BSN Transitions of Care Unit 4E- RN Case Manager See Treatment Team for direct phone #    Patient Details  Name: Shirley Nichols MRN: [id number] Date of Birth: July 24, 1940  Transition of Care [facility]) CM/SW Contact:  Dawayne Patricia, RN Phone Number: 03/23/2022, 2:34 PM   Clinical Narrative:    Notified by [facility] rehab liaison Genie that insurance has given auth for admission and bed available today.  MD has cleared pt and INPT rehab MD has agreed that pt stable to transition to [facility] rehab today.  CM has reached out to Amy w/ [facility] to update and let them know pt will transition to [facility]- [facility] to follow for any [facility] needs post rehab stay.   No further TOC needs noted, pt will transition later this afternoon to [facility] INPT rehab   Final next level of care: IP Rehab Facility Barriers to Discharge: Barriers Resolved   Patient Goals and CMS Choice Patient states their goals for this hospitalization and ongoing recovery are:: To return home CMS Medicare.gov Compare Post Acute Care list provided to:: Patient Represenative (must comment) (Patient's husband) Choice offered to / list presented to : Spouse  Discharge Placement               [facility] INPT rehab        Discharge Plan and Services   Discharge Planning Services: CM Consult Post Acute Care Choice: IP Rehab                    HH Arranged: PT, OT HH Agency: [place] Date [place]: 03/10/22 Time [place]: 1208 Representative spoke with at [place]: Gevena Barre, [place] with [facility]  Social Determinants of Health ([place]) Interventions     Readmission Risk Interventions    03/23/2022    2:34 PM 03/10/2022    1:01 PM  Readmission Risk Prevention Plan  Transportation Screening  Complete  PCP or Specialist Appt within 5-7 Days  Complete  Home Care Screening  Complete  Medication Review (RN CM)  Complete  HRI or  Home Care Consult Complete   Social Work Consult for [place] Planning/Counseling Complete   Palliative Care Screening Complete   Medication Review Press photographer) Complete

## 2022-03-23 NOTE — Progress Notes (Signed)
IP rehab admissions - I have approval for acute inpatient rehab admission.  I have reached out to attending MD.  If patient is medically ready, we do have a bed open on inpatient rehab today.  Please let me know if medically ready for CIR today.  Call [phone]

## 2022-03-23 NOTE — Progress Notes (Signed)
Arrived to the unit accompanied by Husband. Admission/Assessment complete.    Yehuda Mao, LPN

## 2022-03-23 NOTE — H&P (Addendum)
Physical Medicine and Rehabilitation Admission H&P       HPI: Shirley Nichols is a 81 year old right-handed female with history of hypertension, hyperlipidemia as well as history of tobacco use.  Per chart review patient lives with spouse.  1 level home with a few steps to entry.  Independent and active prior to admission.  Presented 03/03/2022 to the emergency department [facility] for evaluation of elevated LFTs on outpatient blood work.  By report patient had seen her PCP in August 2023 due to generalized weakness blood work was obtained patient notified of elevated LFTs.  Blood work noted BUN 57 creatinine 2.46 alkaline phosphatase 739 AST 188 ALT 229 total bilirubin 5.1 and lipase 342.  MRCP revealed 3.4 cm masslike density in the pancreatic head suspicious for pancreatic carcinoma with distal CBD stricture and diffuse biliary ductal dilatation.  GI services Dr. Vicente Males was consulted by the ED physician and patient was transferred to [facility] for further evaluation.  Underwent ERCP 03/05/2022 per Dr. Benson Norway that showed the upper third of the main bile duct and middle third of the main bile duct were dilated, with a mass causing an obstruction and underwent sphincterotomy and biliary stent placement with brush biopsy.  Hospital course complicated by acute respiratory failure requiring short-term intubation.  Bouts of confusion altered mental status as well as shaking with right lower extremity jerking and CT/MRI showed no metastatic disease or acute intracranial abnormality.  LP completed that was unrevealing.  EEG negative for seizure and patient did remain on Keppra as well as Dilantin..  She did receive consult by ophthalmology for blurry vision OU with work-up unremarkable felt to be related to toxic metabolic encephalopathy.  LFTs continue to trend downward.  Total bilirubin has normalized.  No current plans for repeat of ERCP.  Follow-up of AKI question CKD maintain on gentle IV fluids with  latest creatinine 1.07 as well as persistent hypokalemia with IV supplement ongoing and latest potassium 2.9.  Oncology services Dr. Alvy Bimler consulted for suspect pancreatic cancer and CT of the chest completed 03/11/2022 showing a 1.8 cm hypervascular subcapsular lesion in the dome of the liver that was not visible on MRI 03/03/2022 felt to be related to perfusion anomaly given the history of suspect pancreatic cancer recommend ongoing follow-up.  Incidental finding of 2.1 cm right thyroid nodule as well as stable left adrenal nodule and current plan is to address chemotherapy as an outpatient.  Palliative care has been consulted to establish goals of care.  Lovenox initiated for DVT prophylaxis 03/19/2022.  Therapy evaluations completed due to patient decreased functional mobility was admitted for a comprehensive rehab program.   Review of Systems  Constitutional:  Negative for chills and fever.  HENT:  Negative for hearing loss.   Eyes:  Negative for blurred vision and double vision.  Respiratory:  Negative for cough, shortness of breath and wheezing.   Cardiovascular:  Negative for chest pain, palpitations and leg swelling.  Gastrointestinal:  Positive for constipation. Negative for heartburn, nausea and vomiting.  Genitourinary:  Negative for dysuria, flank pain and hematuria.  Musculoskeletal:  Positive for joint pain and myalgias.  Skin:  Negative for rash.  Neurological:  Positive for weakness.  All other systems reviewed and are negative.       Past Medical History:  Diagnosis Date   Cancer ([place])      skin cancer   Hyperlipidemia     Hypertension  Past Surgical History:  Procedure Laterality Date   ABDOMINAL HYSTERECTOMY       BILIARY BRUSHING   03/05/2022    Procedure: BILIARY BRUSHING;  Surgeon: Carol Ada, MD;  Location: [facility] ENDOSCOPY;  Service: Gastroenterology;;   BILIARY STENT PLACEMENT   03/05/2022    Procedure: BILIARY STENT PLACEMENT;  Surgeon: Carol Ada,  MD;  Location: [facility] ENDOSCOPY;  Service: Gastroenterology;;   COLONOSCOPY        02/21/2002 adenomatous polyp, 03/23/2011   COLONOSCOPY W/ POLYPECTOMY   02/2000    TA polyp removed from ascending colon.  performed through [facility] (? affiliate in [place])   COLONOSCOPY WITH PROPOFOL N/A 11/03/2016    Procedure: COLONOSCOPY WITH PROPOFOL;  Surgeon: Manya Silvas, MD;  Location: [facility] ENDOSCOPY;  Service: Endoscopy;  Laterality: N/A;   ERCP N/A 03/05/2022    Procedure: ENDOSCOPIC RETROGRADE CHOLANGIOPANCREATOGRAPHY (ERCP);  Surgeon: Carol Ada, MD;  Location: [place];  Service: Gastroenterology;  Laterality: N/A;   HEMORROIDECTOMY       TONSILLECTOMY             Family History  Problem Relation Age of Onset   Breast cancer Neg Hx      Social History:  reports that she has quit smoking. She has never used smokeless tobacco. She reports that she does not drink alcohol and does not use drugs. Allergies: No Known Allergies       Medications Prior to Admission  Medication Sig Dispense Refill   SYMBICORT 160-4.5 MCG/ACT inhaler Inhale 2 puffs into the lungs as needed (for wheezing or shortness of breath).   4   aspirin EC 81 MG tablet Take 81 mg by mouth daily.       ezetimibe (ZETIA) 10 MG tablet Take 10 mg by mouth daily.       fluvastatin XL (LESCOL XL) 80 MG 24 hr tablet Take 80 mg by mouth daily.   1   valsartan-hydrochlorothiazide (DIOVAN-HCT) 320-25 MG tablet Take 1 tablet by mouth daily.   4          Home: Home Living Family/patient expects to be discharged to:: Private residence Living Arrangements: Spouse/significant other Available Help at Discharge: Family Type of Home: House Home Access: Stairs to enter Technical brewer of Steps: few Home Layout: One level Bathroom Shower/Tub: Tub/shower unit, Multimedia programmer: Standard Home Equipment: Conservation officer, nature (2 wheels)   Functional History: Prior Function Prior Level of Function :  Independent/Modified Independent, Driving Mobility Comments: per pt's husband, pt is very active and can walk "miles" ADLs Comments: indep, driving, independent med mgmt, IADLs   Functional Status:  Mobility: Bed Mobility Overal bed mobility: Needs Assistance Bed Mobility: Supine to Sit, Sit to Supine Supine to sit: Supervision Sit to supine: Supervision General bed mobility comments: No physical assist, + use of bed rail Transfers Overall transfer level: Needs assistance Equipment used: None Transfers: Sit to/from Stand Sit to Stand: Min assist General transfer comment: Light minA to power up from edge of bed and toilet Ambulation/Gait Ambulation/Gait assistance: Min assist Gait Distance (Feet): 100 Feet Assistive device: None Gait Pattern/deviations: Decreased stride length, Step-through pattern, Step-to pattern General Gait Details: Slowed step to vs step through pattern, frequently reaching out for additional external support, mild dynamic instability. cautious overall Gait velocity: 0.69 ft/s Gait velocity interpretation: <1.31 ft/sec, indicative of household ambulator Pre-gait activities: Stood x 3 at EOB with +2 min assist. Twice shuffled feet laterally to move up toward head of bed.   ADL: ADL  Overall ADL's : Needs assistance/impaired Eating/Feeding Details (indicate cue type and reason): spouse reports yesterday unable to use spoon to feed self, but was able to today Grooming: Wash/dry face, Oral care, Standing Grooming Details (indicate cue type and reason): completed standing at sink Upper Body Dressing : Minimal assistance, Sitting Upper Body Dressing Details (indicate cue type and reason): assist to cue for sequencing, line mgmt Lower Body Dressing: Min guard, Sitting/lateral leans Lower Body Dressing Details (indicate cue type and reason): donning/doffing socks Toilet Transfer: Min guard, Rolling walker (2 wheels), Ambulation, Regular Toilet Toilet Transfer  Details (indicate cue type and reason): simulated via functional mobility Toileting- Clothing Manipulation and Hygiene: Min guard, Sitting/lateral lean Toileting - Clothing Manipulation Details (indicate cue type and reason): for pericare/clothing mgmt Functional mobility during ADLs: Min guard, Rolling walker (2 wheels) General ADL Comments: Focus on trial of further standing though per RN, did not want to place pt in chair today with EEG on   Cognition: Cognition Overall Cognitive Status: Impaired/Different from baseline Orientation Level: Oriented to person, Oriented to place, Oriented to situation Cognition Arousal/Alertness: Awake/alert Behavior During Therapy: WFL for tasks assessed/performed Overall Cognitive Status: Impaired/Different from baseline Area of Impairment: Orientation, Attention, Memory, Following commands, Safety/judgement, Awareness, Problem solving Orientation Level: Time, Situation Current Attention Level: Sustained Memory: Decreased short-term memory Following Commands: Follows one step commands with increased time Safety/Judgement: Decreased awareness of safety, Decreased awareness of deficits Awareness: Emergent Problem Solving: Slow processing, Decreased initiation, Difficulty sequencing, Requires verbal cues, Requires tactile cues General Comments: STM deficits noted, looking to daughter to answer questions   Physical Exam: Blood pressure (!) 157/77, pulse 73, temperature 98.4 F (36.9 C), temperature source Oral, resp. rate 16, height '5\' 4"'$  (1.626 m), weight 84.8 kg, SpO2 95 %. Physical Exam  Constitutional: No apparent distress. Appropriate appearance for age. +underweight HENT: No JVD. Neck Supple. Trachea midline. Atraumatic, normocephalic. Eyes: PERRLA. EOMI. Visual fields grossly intact.  Cardiovascular: RRR, no murmurs/rub/gallops. No Edema. Peripheral pulses 2+  Respiratory: CTAB. No rales, rhonchi, or wheezing. On RA.  Abdomen: + bowel sounds,  normoactive. No distention or tenderness.  GU: Not examined.  Skin: C/D/I. No apparent lesions. MSK:      No apparent deformity.      Strength: 5-/5 in bilateral upper and lower extremities throughout  Neurologic exam:  Cognition: AAO to person, place, time; not oriented to event Language: Fluent, No substitutions or neoglisms. No dysarthria. Names 3/3 objects correctly.  Memory: Recalls 0/3 objects at 5 minutes. Defers to husband on most memory tasks Insight: Poor insight into current condition.  Mood: Pleasant affect, appropriate mood.  Sensation: To light touch intact in BL UEs and LEs  Reflexes: 2+ in BL UE and LEs. Negative Hoffman's and babinski signs bilaterally.  CN: 2-12 grossly intact.  Coordination: No apparent tremors. No ataxia on FTN, HTS bilaterally.  Spasticity: MAS 0 in all extremities.  Gait: Not observed       Lab Results Last 48 Hours        Results for orders placed or performed during the hospital encounter of 03/03/22 (from the past 48 hour(s))  Glucose, capillary     Status: Abnormal    Collection Time: 03/21/22  4:28 PM  Result Value Ref Range    Glucose-Capillary 137 (H) 70 - 99 mg/dL      Comment: Glucose reference range applies only to samples taken after fasting for at least 8 hours.    Comment 1 Notify RN  Comment 2 Document in Chart    Glucose, capillary     Status: Abnormal    Collection Time: 03/21/22  8:25 PM  Result Value Ref Range    Glucose-Capillary 121 (H) 70 - 99 mg/dL      Comment: Glucose reference range applies only to samples taken after fasting for at least 8 hours.    Comment 1 Notify RN      Comment 2 Document in Chart    Glucose, capillary     Status: Abnormal    Collection Time: 03/22/22 12:02 AM  Result Value Ref Range    Glucose-Capillary 113 (H) 70 - 99 mg/dL      Comment: Glucose reference range applies only to samples taken after fasting for at least 8 hours.    Comment 1 Notify RN      Comment 2 Document in Chart     Glucose, capillary     Status: Abnormal    Collection Time: 03/22/22  4:33 AM  Result Value Ref Range    Glucose-Capillary 112 (H) 70 - 99 mg/dL      Comment: Glucose reference range applies only to samples taken after fasting for at least 8 hours.    Comment 1 Notify RN      Comment 2 Document in Chart    Comprehensive metabolic panel     Status: Abnormal    Collection Time: 03/22/22  5:35 AM  Result Value Ref Range    Sodium 138 135 - 145 mmol/L    Potassium 3.4 (L) 3.5 - 5.1 mmol/L    Chloride 102 98 - 111 mmol/L    CO2 27 22 - 32 mmol/L    Glucose, Bld 106 (H) 70 - 99 mg/dL      Comment: Glucose reference range applies only to samples taken after fasting for at least 8 hours.    BUN 16 8 - 23 mg/dL    Creatinine, Ser 1.18 (H) 0.44 - 1.00 mg/dL    Calcium 8.3 (L) 8.9 - 10.3 mg/dL    Total Protein 5.2 (L) 6.5 - 8.1 g/dL    Albumin 2.1 (L) 3.5 - 5.0 g/dL    AST 22 15 - 41 U/L    ALT 32 0 - 44 U/L    Alkaline Phosphatase 223 (H) 38 - 126 U/L    Total Bilirubin 0.8 0.3 - 1.2 mg/dL    GFR, Estimated 46 (L) >60 mL/min      Comment: (NOTE) Calculated using the CKD-EPI Creatinine Equation (2021)      Anion gap 9 5 - 15      Comment: Performed at [facility], [place] [address], [place] [address]  CBC     Status: Abnormal    Collection Time: 03/22/22  5:35 AM  Result Value Ref Range    WBC 6.6 4.0 - 10.5 K/uL    RBC 3.12 (L) 3.87 - 5.11 MIL/uL    Hemoglobin 8.1 (L) 12.0 - 15.0 g/dL    HCT 25.9 (L) 36.0 - 46.0 %    MCV 83.0 80.0 - 100.0 fL    MCH 26.0 26.0 - 34.0 pg    MCHC 31.3 30.0 - 36.0 g/dL    RDW 15.1 11.5 - 15.5 %    Platelets 321 150 - 400 K/uL    nRBC 0.0 0.0 - 0.2 %      Comment: Performed at [facility], [place] [address] [place], [place] [address]  Glucose, capillary  Status: None    Collection Time: 03/22/22  7:45 AM  Result Value Ref Range    Glucose-Capillary 98 70 - 99 mg/dL      Comment: Glucose reference range applies only to samples  taken after fasting for at least 8 hours.  Glucose, capillary     Status: Abnormal    Collection Time: 03/22/22 11:34 AM  Result Value Ref Range    Glucose-Capillary 129 (H) 70 - 99 mg/dL      Comment: Glucose reference range applies only to samples taken after fasting for at least 8 hours.  Glucose, capillary     Status: Abnormal    Collection Time: 03/22/22  4:16 PM  Result Value Ref Range    Glucose-Capillary 134 (H) 70 - 99 mg/dL      Comment: Glucose reference range applies only to samples taken after fasting for at least 8 hours.  Glucose, capillary     Status: Abnormal    Collection Time: 03/22/22  9:44 PM  Result Value Ref Range    Glucose-Capillary 123 (H) 70 - 99 mg/dL      Comment: Glucose reference range applies only to samples taken after fasting for at least 8 hours.    Comment 1 Notify RN      Comment 2 Document in Chart    Glucose, capillary     Status: Abnormal    Collection Time: 03/23/22 12:22 AM  Result Value Ref Range    Glucose-Capillary 178 (H) 70 - 99 mg/dL      Comment: Glucose reference range applies only to samples taken after fasting for at least 8 hours.    Comment 1 Notify RN      Comment 2 Document in Chart    CBC     Status: Abnormal    Collection Time: 03/23/22  2:45 AM  Result Value Ref Range    WBC 6.2 4.0 - 10.5 K/uL    RBC 3.01 (L) 3.87 - 5.11 MIL/uL    Hemoglobin 8.1 (L) 12.0 - 15.0 g/dL    HCT 24.5 (L) 36.0 - 46.0 %    MCV 81.4 80.0 - 100.0 fL    MCH 26.9 26.0 - 34.0 pg    MCHC 33.1 30.0 - 36.0 g/dL    RDW 15.2 11.5 - 15.5 %    Platelets 317 150 - 400 K/uL    nRBC 0.0 0.0 - 0.2 %      Comment: Performed at [facility], [place] [address], [place], [place] [address]  Comprehensive metabolic panel     Status: Abnormal    Collection Time: 03/23/22  2:45 AM  Result Value Ref Range    Sodium 134 (L) 135 - 145 mmol/L    Potassium 2.9 (L) 3.5 - 5.1 mmol/L    Chloride 98 98 - 111 mmol/L    CO2 27 22 - 32 mmol/L    Glucose, Bld 148 (H) 70  - 99 mg/dL      Comment: Glucose reference range applies only to samples taken after fasting for at least 8 hours.    BUN 24 (H) 8 - 23 mg/dL    Creatinine, Ser 1.10 (H) 0.44 - 1.00 mg/dL    Calcium 8.0 (L) 8.9 - 10.3 mg/dL    Total Protein 5.2 (L) 6.5 - 8.1 g/dL    Albumin 2.2 (L) 3.5 - 5.0 g/dL    AST 28 15 - 41 U/L    ALT 30 0 - 44 U/L  Alkaline Phosphatase 205 (H) 38 - 126 U/L    Total Bilirubin 0.7 0.3 - 1.2 mg/dL    GFR, Estimated 50 (L) >60 mL/min      Comment: (NOTE) Calculated using the CKD-EPI Creatinine Equation (2021)      Anion gap 9 5 - 15      Comment: Performed at [facility] [address] [place],  [address]  Glucose, capillary     Status: Abnormal    Collection Time: 03/23/22  5:21 AM  Result Value Ref Range    Glucose-Capillary 68 (L) 70 - 99 mg/dL      Comment: Glucose reference range applies only to samples taken after fasting for at least 8 hours.    Comment 1 Notify RN      Comment 2 Document in Chart    Glucose, capillary     Status: Abnormal    Collection Time: 03/23/22  7:53 AM  Result Value Ref Range    Glucose-Capillary 156 (H) 70 - 99 mg/dL      Comment: Glucose reference range applies only to samples taken after fasting for at least 8 hours.    Comment 1 Notify RN      Comment 2 Document in Chart        Imaging Results (Last 48 hours)  No results found.         Blood pressure (!) 157/77, pulse 73, temperature 98.4 F (36.9 C), temperature source Oral, resp. rate 16, height '5\' 4"'$  (1.626 m), weight 84.8 kg, SpO2 95 %.   Medical Problem List and Plan: 1. 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  EEG negative.  Continue Keppra 750 mg twice daily and Dilantin 300 mg nightly             -patient may shower             -ELOS/Goals: 10-14 days, Mod I PT/OT, I SLP goals 2.  Antithrombotics: -DVT/anticoagulation:  Pharmaceutical: Lovenox             -antiplatelet therapy:  N/A  3. Pain Management: Oxycodone as needed 4. Mood/Behavior/Sleep: Provide emotional support             -antipsychotic agents: N/A 5. Neuropsych/cognition: This patient is not capable of making decisions on her own behalf. 6. Skin/Wound Care: Routine skin checks 7. Fluids/Electrolytes/Nutrition (hypokalemia, hypomagnasemia): Routine in and outs with follow-up chemistries           - Receiving intermittent IV K and Mag repletion; check admission labs and then would start on standing supplementation   8.  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  Status post ERCP with stenting 03/05/2022 per Dr. Benson Norway.  LFTs improving with latest total bilirubin 0.7.             - Follow-up outpatient oncology services [facility] for Recs regarding chemotherapy  9.  AKI.   Follow-up chemistries 10.  Constipation.  MiraLAX daily as well as Senokot-S tablet twice daily     Cathlyn Parsons, PA-C 03/23/2022   I have examined the patient independently and edited the note for HPI, ROS, exam, assessment, and plan as appropriate. I am in agreement with the above recommendations.   Gertie Gowda, DO 03/23/2022

## 2022-03-23 NOTE — Progress Notes (Signed)
PMR Admission Coordinator Pre-Admission Assessment   Patient: Shirley Nichols is an 81 y.o., female MRN: [id number] DOB: 1940/07/22 Height: 5\' 4"  (162.6 cm) Weight: 84.8 kg   Insurance Information HMO:     PPO: Yes     PCP:      IPA:      80/20:      OTHER:  PRIMARY: [employer]      Policy#: [id number]      Subscriber: patient CM Name:       Phone#: [phone]  Opt 3   Fax#: [phone] Pre-Cert#: [id number] approved 03/21/22 for initial 7 days      Employer: Retired [employer] Date: 02/26/2022-05/28/22     Deduct: $0      Out of Pocket Max: $3,200 ($200 met)       CIR: $295/day co-pay for days 1-6, $0/day days7-90      SNF: $0/day co pay for days 1-20, $184/day co pay for days 21-100, limited to 100 days/benefit period Outpatient: $15/visit copay, limited by medical nec.     Home Health: 100% coverage       DME: 80% coverage, 20% coinsurance        The "Data Collection Information Summary" for patients in Inpatient Rehabilitation Facilities with attached "Privacy Act [place] Records" was provided and verbally reviewed with: Patient and Family   Emergency Contact Information Contact Information       Name Relation Home Work Mobile    [place] Spouse [phone]   [phone]    Ladoris, Lythgoe Daughter [phone]               Current Medical History  Patient Admitting Diagnosis: Pancreatic Ca, seizures   History of Present Illness: Shirley Nichols is a 81 year old right-handed female with history of hypertension, hyperlipidemia as well as history of tobacco use.  Per chart review patient lives with spouse.  1 level home with a few steps to entry.  Independent and active prior to admission.  Presented 03/03/2022 to the emergency department [facility] for evaluation of elevated LFTs on outpatient blood work.  By report patient had seen her PCP in August 2023 due to generalized weakness blood work was obtained patient notified of elevated LFTs.  Blood work noted BUN  57 creatinine 2.46 alkaline phosphatase 739 AST 188 ALT 229 total bilirubin 5.1 and lipase 342.  MRCP revealed 3.4 cm masslike density in the pancreatic head suspicious for pancreatic carcinoma with distal CBD stricture and diffuse biliary ductal dilatation.  GI services Dr. Vicente Males was consulted by the ED physician and patient was transferred to [facility] for further evaluation.  Underwent ERCP 03/05/2022 per Dr. Benson Norway that showed the upper third of the main bile duct and middle third of the main bile duct were dilated, with a mass causing an obstruction and underwent sphincterotomy and biliary stent placement with brush biopsy.  Hospital course complicated by acute respiratory failure requiring short-term intubation.  Bouts of confusion altered mental status as well as shaking with right lower extremity jerking and CT/MRI showed no metastatic disease or acute intracranial abnormality.  LP completed that was unrevealing.  EEG negative for seizure and patient did remain on Keppra as well as Dilantin..  She did receive consult by ophthalmology for blurry vision OU with work-up unremarkable felt to be related to toxic metabolic encephalopathy.  LFTs continue to trend downward.  Total bilirubin has normalized.  No current plans for repeat of ERCP.  Follow-up of AKI question CKD maintain on gentle IV  fluids with latest creatinine 1.07.  Oncology services Dr. Alvy Bimler consulted for suspect pancreatic cancer and CT of the chest completed 03/11/2022 showing a 1.8 cm hypervascular subcapsular lesion in the dome of the liver that was not visible on MRI 03/03/2022 felt to be related to perfusion anomaly given the history of suspect pancreatic cancer recommend ongoing follow-up.  Incidental finding of 2.1 cm right thyroid nodule as well as stable left adrenal nodule and current plan is to address chemotherapy as an outpatient.  Palliative care has been consulted to establish goals of care.  Lovenox initiated for DVT  prophylaxis 03/19/2022.  Therapy evaluations completed due to patient decreased functional mobility.  Patient to be admitted for a comprehensive inpatient rehab program.   Complete NIHSS TOTAL: 0   [facility]'s medical record from [facility] has been reviewed by the rehabilitation admission coordinator and physician.   Past Medical History      Past Medical History:  Diagnosis Date   Cancer ([place])      skin cancer   Hyperlipidemia     Hypertension        Has the patient had major surgery during 100 days prior to admission? Yes   Family History   family history is not on file.   Current Medications   Current Facility-Administered Medications:    0.9 % NaCl with KCl 40 mEq / L  infusion, , Intravenous, Continuous, Samtani, Jai-Gurmukh, MD, Last Rate: 75 mL/hr at 03/23/22 1018, New Bag at 03/23/22 1018   acetaminophen (TYLENOL) tablet 650 mg, 650 mg, Oral, Q6H PRN, Howerter, Justin B, DO, 650 mg at 03/14/22 0558   arformoterol (BROVANA) nebulizer solution 15 mcg, 15 mcg, Nebulization, BID, Mick Sell, PA-C, 15 mcg at 03/23/22 6073   bisacodyl (DULCOLAX) suppository 10 mg, 10 mg, Rectal, Once, Leigh Aurora, DO   budesonide (PULMICORT) nebulizer solution 0.25 mg, 0.25 mg, Nebulization, BID, Payne, John D, PA-C, 0.25 mg at 03/23/22 0908   Chlorhexidine Gluconate Cloth 2 % PADS 6 each, 6 each, Topical, Daily, Chand, Sudham, MD, 6 each at 03/23/22 0525   enoxaparin (LOVENOX) injection 40 mg, 40 mg, Subcutaneous, Q24H, Donne Hazel, MD, 40 mg at 03/22/22 2154   feeding supplement (ENSURE ENLIVE / ENSURE PLUS) liquid 237 mL, 237 mL, Oral, TID BM, Chand, Sudham, MD, 237 mL at 03/22/22 2152   hydrALAZINE (APRESOLINE) injection 10-40 mg, 10-40 mg, Intravenous, Q4H PRN, Mick Sell, PA-C, 20 mg at 03/14/22 2134   insulin aspart (novoLOG) injection 0-9 Units, 0-9 Units, Subcutaneous, Q4H, Anders Simmonds, MD, 2 Units at 03/23/22 0842   levETIRAcetam (KEPPRA) tablet 750 mg, 750 mg, Oral, BID,  Greta Doom, MD, 750 mg at 03/23/22 0838   LORazepam (ATIVAN) injection 2 mg, 2 mg, Intravenous, Q4H PRN, Donnetta Simpers, MD, 2 mg at 03/11/22 2357   ondansetron (ZOFRAN) tablet 4 mg, 4 mg, Oral, Q6H PRN **OR** ondansetron (ZOFRAN) injection 4 mg, 4 mg, Intravenous, Q6H PRN, Opyd, Ilene Qua, MD, 4 mg at 03/14/22 1322   Oral care mouth rinse, 15 mL, Mouth Rinse, 4 times per day, Jacky Kindle, MD, 15 mL at 03/23/22 1019   oxyCODONE (Oxy IR/ROXICODONE) immediate release tablet 2.5 mg, 2.5 mg, Oral, Q8H PRN, Leigh Aurora, DO, 2.5 mg at 03/18/22 2203   phenytoin (DILANTIN) ER capsule 300 mg, 300 mg, Oral, QHS, Greta Doom, MD, 300 mg at 03/22/22 2156   polyethylene glycol (MIRALAX / GLYCOLAX) packet 17 g, 17 g, Per Tube, Daily, Jacky Kindle, MD,  17 g at 03/23/22 0838   senna-docusate (Senokot-S) tablet 1 tablet, 1 tablet, Oral, BID, Chand, Sudham, MD, 1 tablet at 03/23/22 6712   sodium chloride flush (NS) 0.9 % injection 10-40 mL, 10-40 mL, Intracatheter, Q12H, Candee Furbish, MD, 10 mL at 03/23/22 1020   sodium chloride flush (NS) 0.9 % injection 10-40 mL, 10-40 mL, Intracatheter, PRN, Candee Furbish, MD   Patients Current Diet:  Diet Order                  Diet - low sodium heart healthy             Diet regular Room service appropriate? No; Fluid consistency: Thin  Diet effective now                         Precautions / Restrictions Precautions Precautions: Fall Precaution Comments: EEG monitor Restrictions Weight Bearing Restrictions: No    Has the patient had 2 or more falls or a fall with injury in the past year? No   Prior Activity Level   Prior Functional Level Self Care: Did the patient need help bathing, dressing, using the toilet or eating? Independent   Indoor Mobility: Did the patient need assistance with walking from room to room (with or without device)? Independent   Stairs: Did the patient need assistance with internal or external  stairs (with or without device)? Independent   Functional Cognition: Did the patient need help planning regular tasks such as shopping or remembering to take medications? Independent   Patient Information Are you of Hispanic, Latino/a,or Spanish origin?: A. No, not of Hispanic, Latino/a, or Spanish origin What is your race?: A. White Do you need or want an interpreter to communicate with a doctor or health care staff?: 0. No   Patient's Response To:  Health Literacy and Transportation Is the patient able to respond to health literacy and transportation needs?: Yes Health Literacy - How often do you need to have someone help you when you read instructions, pamphlets, or other written material from your doctor or pharmacy?: Never In the past 12 months, has lack of transportation kept you from medical appointments or from getting medications?: No In the past 12 months, has lack of transportation kept you from meetings, work, or from getting things needed for daily living?: No   Development worker, international aid / [place] Devices/Equipment: None Home Equipment: Conservation officer, nature (2 wheels)   Prior Device Use: Indicate devices/aids used by the patient prior to current illness, exacerbation or injury? None of the above   Current Functional Level Cognition   Overall Cognitive Status: Impaired/Different from baseline Current Attention Level: Sustained Orientation Level: Oriented to person, Oriented to place, Oriented to situation Following Commands: Follows one step commands with increased time Safety/Judgement: Decreased awareness of safety, Decreased awareness of deficits General Comments: STM deficits noted, looking to daughter to answer questions    Extremity Assessment (includes Sensation/Coordination)   Upper Extremity Assessment: Generalized weakness  Lower Extremity Assessment: Defer to PT evaluation     ADLs   Overall ADL's : Needs assistance/impaired Eating/Feeding Details  (indicate cue type and reason): spouse reports yesterday unable to use spoon to feed self, but was able to today Grooming: Wash/dry face, Oral care, Standing Grooming Details (indicate cue type and reason): completed standing at sink Upper Body Dressing : Minimal assistance, Sitting Upper Body Dressing Details (indicate cue type and reason): assist to cue for sequencing, line mgmt Lower  Body Dressing: Min guard, Sitting/lateral leans Lower Body Dressing Details (indicate cue type and reason): donning/doffing socks Toilet Transfer: Min guard, Rolling walker (2 wheels), Ambulation, Regular Toilet Toilet Transfer Details (indicate cue type and reason): simulated via functional mobility Toileting- Clothing Manipulation and Hygiene: Min guard, Sitting/lateral lean Toileting - Clothing Manipulation Details (indicate cue type and reason): for pericare/clothing mgmt Functional mobility during ADLs: Min guard, Rolling walker (2 wheels) General ADL Comments: Focus on trial of further standing though per RN, did not want to place pt in chair today with EEG on     Mobility   Overal bed mobility: Needs Assistance Bed Mobility: Supine to Sit, Sit to Supine Supine to sit: Supervision Sit to supine: Supervision General bed mobility comments: No physical assist, + use of bed rail     Transfers   Overall transfer level: Needs assistance Equipment used: None Transfers: Sit to/from Stand Sit to Stand: Min assist General transfer comment: Light minA to power up from edge of bed and toilet     Ambulation / Gait / Stairs / Wheelchair Mobility   Ambulation/Gait Ambulation/Gait assistance: Herbalist (Feet): 100 Feet Assistive device: None Gait Pattern/deviations: Decreased stride length, Step-through pattern, Step-to pattern General Gait Details: Slowed step to vs step through pattern, frequently reaching out for additional external support, mild dynamic instability. cautious overall Gait  velocity: 0.69 ft/s Gait velocity interpretation: <1.31 ft/sec, indicative of household ambulator Pre-gait activities: Stood x 3 at EOB with +2 min assist. Twice shuffled feet laterally to move up toward head of bed.     Posture / Balance Dynamic Sitting Balance Sitting balance - Comments: able to reach outside BOS without LOB Balance Overall balance assessment: Needs assistance Sitting-balance support: No upper extremity supported, Feet supported, Bilateral upper extremity supported Sitting balance-Leahy Scale: Good Sitting balance - Comments: able to reach outside BOS without LOB Standing balance support: No upper extremity supported, During functional activity Standing balance-Leahy Scale: Fair Standing balance comment: can stand statically at sink without LOB     Special needs/care consideration None    Previous Home Environment (from acute therapy documentation) Living Arrangements: Spouse/significant other Available Help at Discharge: Family Type of Home: House Home Layout: One level Home Access: Stairs to enter [employer] of Steps: few Bathroom Shower/Tub: Tub/shower unit, Multimedia programmer: [place]: No   Discharge Living Setting Plans for Discharge Living Setting: Patient's home, House, Lives with (comment) (spouse) Discharge Home Layout: One level Discharge Home Access: Stairs to enter Entrance Stairs-Number of Steps: a few Discharge Bathroom Shower/Tub: Tub/shower unit, Walk-in shower Discharge Bathroom Toilet: Standard Does the patient have any problems obtaining your medications?: No   Social/Family/Support Systems Patient Roles: Spouse Anticipated Caregiver: Spouse, Bobby/daughter Dispensing optician Information: [phone] Caregiver Availability: 24/7 Discharge Plan Discussed with Primary Caregiver: Yes Is Caregiver In Agreement with Plan?: Yes Does Caregiver/Family have Issues  with Lodging/Transportation while Pt is in Rehab?: No   Goals Patient/Family Goal for Rehab: Mod I, PT, OT, Independent with SLP Expected length of stay: 10-12 days Pt/Family Agrees to Admission and willing to participate: Yes Program Orientation Provided & Reviewed with Pt/Caregiver Including Roles  & Responsibilities: Yes  Barriers to Discharge: Insurance for SNF coverage   Decrease burden of Care through IP rehab admission: Othern/a   Possible need for SNF placement upon discharge: not anticipated   Patient Condition: I have reviewed medical records from [facility], spoken with  TOC , and patient, spouse, and daughter.  I met with patient at the bedside for inpatient rehabilitation assessment.  Patient will benefit from ongoing PT, OT, and SLP, can actively participate in 3 hours of therapy a day 5 days of the week, and can make measurable gains during the admission.  Patient will also benefit from the coordinated team approach during an Inpatient Acute Rehabilitation admission.  The patient will receive intensive therapy as well as Rehabilitation physician, nursing, social worker, and care management interventions.  Due to safety, skin/wound care, disease management, medication administration, pain management, and patient education the patient requires 24 hour a day rehabilitation nursing.  The patient is currently Min A with mobility and basic ADLs.  Discharge setting and therapy post discharge at home with home health is anticipated.  Patient has agreed to participate in the Acute Inpatient Rehabilitation Program and will admit today, 03/23/22.   Preadmission Screen Completed By:  Retta Diones, 03/23/2022 12:18 PM ______________________________________________________________________   Discussed status with Dr. Tressa Busman on 03/23/22 at 1200 and received approval for admission today.   Admission Coordinator:  Retta Diones, RN, time 1217/Date 03/23/22    Assessment/Plan: Diagnosis: Does  the need for close, 24 hr/day Medical supervision in concert with the patient's rehab needs make it unreasonable for this patient to be served in a less intensive setting? Yes Co-Morbidities requiring supervision/potential complications: Jaundice secondary to pancreatic obstructing mass status post stent, AKI, seizure, encephalopathy, hypokalemia, hypomagnesemia, anemia,  AHRF, constipation Due to bowel management, safety, skin/wound care, disease management, medication administration, pain management, and patient education, does the patient require 24 hr/day rehab nursing? Yes Does the patient require coordinated care of a physician, rehab nurse, PT, OT, and SLP to address physical and functional deficits in the context of the above medical diagnosis(es)? Yes Addressing deficits in the following areas: balance, endurance, locomotion, strength, transferring, bowel/bladder control, bathing, dressing, feeding, grooming, toileting, cognition, and psychosocial support Can the patient actively participate in an intensive therapy program of at least 3 hrs of therapy 5 days a week? Yes The potential for patient to make measurable gains while on inpatient rehab is good Anticipated functional outcomes upon discharge from inpatient rehab: modified independent PT, modified independent OT, independent SLP Estimated rehab length of stay to reach the above functional goals is: 10-14 days Anticipated discharge destination: Home 10. Overall Rehab/Functional Prognosis: good     MD Signature:   Gertie Gowda, DO 03/23/2022          Revision History                                               Note Details  Author Gertie Gowda, DO File Time 03/23/2022 12:31 PM  Author Type Physician Status Signed  Last Editor Gertie Gowda, DO Service Physical Medicine and Rehabilitation

## 2022-03-23 NOTE — Care Management Important Message (Signed)
Important Message  Patient Details  Name: Shirley Nichols MRN: [id number] Date of Birth: 20-Aug-1940   Medicare Important Message Given:  Yes     Shelda Altes 03/23/2022, 12:02 PM

## 2022-03-24 DIAGNOSIS — G928 Other toxic encephalopathy: Secondary | ICD-10-CM | POA: Diagnosis not present

## 2022-03-24 LAB — COMPREHENSIVE METABOLIC PANEL
ALT: 33 U/L (ref 0–44)
AST: 31 U/L (ref 15–41)
Albumin: 2.2 g/dL — ABNORMAL LOW (ref 3.5–5.0)
Alkaline Phosphatase: 193 U/L — ABNORMAL HIGH (ref 38–126)
Anion gap: 10 (ref 5–15)
BUN: 20 mg/dL (ref 8–23)
CO2: 25 mmol/L (ref 22–32)
Calcium: 8.1 mg/dL — ABNORMAL LOW (ref 8.9–10.3)
Chloride: 102 mmol/L (ref 98–111)
Creatinine, Ser: 0.99 mg/dL (ref 0.44–1.00)
GFR, Estimated: 57 mL/min — ABNORMAL LOW (ref >60)
Glucose, Bld: 100 mg/dL — ABNORMAL HIGH (ref 70–99)
Potassium: 3.4 mmol/L — ABNORMAL LOW (ref 3.5–5.1)
Sodium: 137 mmol/L (ref 135–145)
Total Bilirubin: 0.7 mg/dL (ref 0.3–1.2)
Total Protein: 5.2 g/dL — ABNORMAL LOW (ref 6.5–8.1)

## 2022-03-24 LAB — CBC WITH DIFFERENTIAL/PLATELET
Abs Immature Granulocytes: 0.03 10*3/uL (ref 0.00–0.07)
Basophils Absolute: 0.1 10*3/uL (ref 0.0–0.1)
Basophils Relative: 1 %
Eosinophils Absolute: 0.2 10*3/uL (ref 0.0–0.5)
Eosinophils Relative: 4 %
HCT: 24.9 % — ABNORMAL LOW (ref 36.0–46.0)
Hemoglobin: 8 g/dL — ABNORMAL LOW (ref 12.0–15.0)
Immature Granulocytes: 1 %
Lymphocytes Relative: 23 %
Lymphs Abs: 1.3 10*3/uL (ref 0.7–4.0)
MCH: 26.1 pg (ref 26.0–34.0)
MCHC: 32.1 g/dL (ref 30.0–36.0)
MCV: 81.4 fL (ref 80.0–100.0)
Monocytes Absolute: 0.4 10*3/uL (ref 0.1–1.0)
Monocytes Relative: 8 %
Neutro Abs: 3.4 10*3/uL (ref 1.7–7.7)
Neutrophils Relative %: 63 %
Platelets: 301 10*3/uL (ref 150–400)
RBC: 3.06 MIL/uL — ABNORMAL LOW (ref 3.87–5.11)
RDW: 15.2 % (ref 11.5–15.5)
WBC: 5.3 10*3/uL (ref 4.0–10.5)
nRBC: 0 % (ref 0.0–0.2)

## 2022-03-24 LAB — GLUCOSE, CAPILLARY
Glucose-Capillary: 104 mg/dL — ABNORMAL HIGH (ref 70–99)
Glucose-Capillary: 121 mg/dL — ABNORMAL HIGH (ref 70–99)

## 2022-03-24 LAB — MAGNESIUM: Magnesium: 1.2 mg/dL — ABNORMAL LOW (ref 1.7–2.4)

## 2022-03-24 LAB — PHOSPHORUS: Phosphorus: 3.7 mg/dL (ref 2.5–4.6)

## 2022-03-24 MED ORDER — MAGNESIUM SULFATE 4 GM/100ML IV SOLN
4.0000 g | Freq: Once | INTRAVENOUS | Status: AC
  Administered 2022-03-24: 4 g via INTRAVENOUS
  Filled 2022-03-24: qty 100

## 2022-03-24 MED ORDER — CHLORHEXIDINE GLUCONATE CLOTH 2 % EX PADS
6.0000 | MEDICATED_PAD | Freq: Every day | CUTANEOUS | Status: DC
  Administered 2022-03-24: 6 via TOPICAL

## 2022-03-24 MED ORDER — CHLORHEXIDINE GLUCONATE CLOTH 2 % EX PADS: 6.0000 | MEDICATED_PAD | Freq: Two times a day (BID) | CUTANEOUS | Status: DC

## 2022-03-24 MED ORDER — POTASSIUM CHLORIDE CRYS ER 20 MEQ PO TBCR
20.0000 meq | EXTENDED_RELEASE_TABLET | Freq: Two times a day (BID) | ORAL | Status: DC
  Administered 2022-03-24 – 2022-03-25 (×2): 20 meq via ORAL
  Filled 2022-03-24 (×2): qty 1

## 2022-03-24 NOTE — Progress Notes (Signed)
Inpatient Rehabilitation Center Individual Statement of Services  Patient Name:  Shirley Nichols  Date:  03/24/2022  Welcome to the [facility].  Our goal is to provide you with an individualized program based on your diagnosis and situation, designed to meet your specific needs.  With this comprehensive rehabilitation program, you will be expected to participate in at least 3 hours of rehabilitation therapies Monday-Friday, with modified therapy programming on the weekends.  Your rehabilitation program will include the following services:  Physical Therapy (PT), Occupational Therapy (OT), Speech Therapy (ST), 24 hour per day rehabilitation nursing, Therapeutic Recreaction (TR), Neuropsychology, Care Coordinator, Rehabilitation Medicine, Nutrition Services, and Pharmacy Services  Weekly team conferences will be held on Tuesday to discuss your progress.  Your Inpatient Rehabilitation Care Coordinator will talk with you frequently to get your input and to update you on team discussions.  Team conferences with you and your family in attendance may also be held.  Expected length of stay: 7-9 days  Overall anticipated outcome: independent with device  Depending on your progress and recovery, your program may change. Your Inpatient Rehabilitation Care Coordinator will coordinate services and will keep you informed of any changes. Your Inpatient Rehabilitation Care Coordinator's name and contact numbers are listed  below.  The following services may also be recommended but are not provided by the [facility] will be made to provide these services after discharge if needed.  Arrangements include referral to agencies that provide these services.  Your insurance has been verified to be:  [employer] Your primary doctor is:  Threasa Alpha  Pertinent information will be shared with your doctor and your insurance company.  Inpatient Rehabilitation Care Coordinator:  Ovidio Kin, Robinson or Emilia Beck  Information discussed with and copy given to patient by: Elease Hashimoto, 03/24/2022, 10:42 AM

## 2022-03-24 NOTE — Progress Notes (Signed)
Late entry: Met with patient and husband at bedside.  Educated on team meetings every Tuesday. SW to follow up. Husband to bring in clothing but daughter had night clothes with buttons. Suggested large vs small buttons. Explained education binder and to keep paperwork from this visit in binder. Informed that has lots of education in it and to use as a reference. Did discuss encephalopathy, A1C 5.9. Discussed diet modifications to reduce fat and sugar content. (HLD) Fresh foods vs canned and processed foods. Blood pressure medications. Husband reports family history of seizures but was not aware until all of this came about. Also discussed issue of constipation but patient reports that NOT constipated that had BM a few days ago. Verified partial code vs full code. Noticed now that code status is FULL. Will verify. Informed husband to order dinner and breakfast and that our dietary department will come by each day to take order.  Will get schedule with therapy times each day around dinner so that family can plan for the next day. Did inform that will try to adhere to schedule as much as possible. Husband concerned about patient and breakfast that may miss due to therapy.  Said that usually helps her.  Assured him that patient will have breakfast and that should be set up for her.  Left staff in room. All needs met.

## 2022-03-24 NOTE — Evaluation (Signed)
Speech Language Pathology Assessment and Plan  Patient Details  Name: Shirley Nichols MRN: [id number] Date of Birth: 1941-05-11  SLP Diagnosis: Cognitive Impairments  Rehab Potential: Good ELOS: 7-10 days   Today's Date: 03/24/2022 SLP Individual Time: 2706-2376 SLP Individual Time Calculation (min): 60 min  Hospital Problem: Principal Problem:   Toxic metabolic encephalopathy  Past Medical History:  Past Medical History:  Diagnosis Date   Cancer ([place])    skin cancer   Hyperlipidemia    Hypertension    Past Surgical History:  Past Surgical History:  Procedure Laterality Date   ABDOMINAL HYSTERECTOMY     BILIARY BRUSHING  03/05/2022   Procedure: BILIARY BRUSHING;  Surgeon: Carol Ada, MD;  Location: [facility] ENDOSCOPY;  Service: Gastroenterology;;   BILIARY STENT PLACEMENT  03/05/2022   Procedure: BILIARY STENT PLACEMENT;  Surgeon: Carol Ada, MD;  Location: [place];  Service: Gastroenterology;;   COLONOSCOPY     02/21/2002 adenomatous polyp, 03/23/2011   COLONOSCOPY W/ POLYPECTOMY  02/2000   TA polyp removed from ascending colon.  performed through [facility] (? affiliate in [place])   COLONOSCOPY WITH PROPOFOL N/A 11/03/2016   Procedure: COLONOSCOPY WITH PROPOFOL;  Surgeon: Manya Silvas, MD;  Location: [facility] ENDOSCOPY;  Service: Endoscopy;  Laterality: N/A;   ERCP N/A 03/05/2022   Procedure: ENDOSCOPIC RETROGRADE CHOLANGIOPANCREATOGRAPHY (ERCP);  Surgeon: Carol Ada, MD;  Location: [place];  Service: Gastroenterology;  Laterality: N/A;   HEMORROIDECTOMY     TONSILLECTOMY      Assessment / Plan / Recommendation Clinical Impression Shirley Nichols is a 81 year old right-handed female with history of hypertension, hyperlipidemia as well as history of tobacco use.  Per chart review patient lives with spouse.  1 level home with a few steps to entry.  Independent and active prior to admission.  Presented 03/03/2022 to the emergency department [facility] for evaluation  of elevated LFTs on outpatient blood work.  GI services Dr. Vicente Males was consulted by the ED physician and patient was transferred to [facility] for further evaluation.  Underwent ERCP 03/05/2022 per Dr. Benson Norway that showed the upper third of the main bile duct and middle third of the main bile duct were dilated, with a mass causing an obstruction and underwent sphincterotomy and biliary stent placement with brush biopsy.  Hospital course complicated by acute respiratory failure requiring short-term intubation.  Bouts of confusion altered mental status as well as shaking with right lower extremity jerking and CT/MRI showed no metastatic disease or acute intracranial abnormality. She did receive consult by ophthalmology for blurry vision OU with work-up unremarkable felt to be related to toxic metabolic encephalopathy. Oncology services Dr. Alvy Bimler consulted for suspect pancreatic cancer and CT of the chest completed 03/11/2022 showing a 1.8 cm hypervascular subcapsular lesion in the dome of the liver that was not visible on MRI 03/03/2022 felt to be related to perfusion anomaly given the history of suspect pancreatic cancer recommend ongoing follow-up. Palliative care has been consulted to establish goals of care. Therapy evaluations completed due to patient decreased functional mobility was admitted for a comprehensive rehab program.  SLP consulted to complete cognitive-linguistic evaluation in the setting of toxic metabolic encephalopathy. Pt presents with moderate cognitive-linguistic deficits (in the domains of orientation/recall of situation, sustained attention, short-term recall, problem solving, and awareness of errors) as evident by achieving a score of 13/30 on SLUMS (n = 27) and further informal assessment. Motor speech, expressive language, and receptive language skills appear [facility] for all tasks assessed. As a result of  these functional deficits, pt required overall min-to-mod A verbal cues for  cognitive task completion. Pt/spouse reports pt is tolerating a regular texture diet with thin liquids with improving PO intake. No concerns identified with swallow function at this time. SLP may assess as clinically indicated. Skilled ST intervention is recommended to address cognitive-linguistic skills and functional independence. Pt/spouse verbalized understanding and agreement with plan.    Skilled Therapeutic Interventions          SLUMS and informal assessment measures administered. Please see report for full details.   SLP Assessment  Patient will need skilled [facility] during CIR admission    Recommendations  Oral Care Recommendations: Oral care BID Recommendations for Other Services: Neuropsych consult Patient destination: Home Follow up Recommendations: 24 hour supervision/assistance;Home Health SLP Equipment Recommended: None recommended by SLP    SLP Frequency 3 to 5 out of 7 days   SLP Duration  SLP Intensity  SLP Treatment/Interventions 7-10 days  Minumum of 1-2 x/day, 30 to 90 minutes  Cognitive remediation/compensation;Patient/family education;Therapeutic Activities;Functional tasks    Pain Pain Assessment Pain Scale: 0-10 Pain Score: 0-No pain Multiple Pain Sites: No  Prior Functioning Cognitive/Linguistic Baseline: Within functional limits Type of Home: House  Lives With: Spouse Available Help at Discharge: Family;Available 24 hours/day Vocation: Retired  Programmer, systems Overall Cognitive Status: Impaired/Different from baseline Arousal/Alertness: Awake/alert Orientation Level: Oriented to person;Oriented to place Year: 2023 Month: October Day of Week: Incorrect Attention: Focused;Sustained;Selective Focused Attention: Appears intact Sustained Attention: Impaired Sustained Attention Impairment: Verbal basic Memory: Impaired Memory Impairment: Storage deficit;Retrieval deficit;Decreased recall of new  information Awareness: Impaired Awareness Impairment: Anticipatory impairment Problem Solving: Impaired Problem Solving Impairment: Verbal basic Safety/Judgment: Impaired Comments: overall slower processing, delayed anticipatory reactions, decreased STM, safety awareness and organziation  Comprehension Auditory Comprehension Overall Auditory Comprehension: Appears within functional limits for tasks assessed Expression Expression Primary Mode of Expression: Verbal Verbal Expression Overall Verbal Expression: Appears within functional limits for tasks assessed Oral Motor Oral Motor/Sensory Function Overall Oral Motor/Sensory Function: Within functional limits Motor Speech Overall Motor Speech: Appears within functional limits for tasks assessed Intelligibility: Intelligible  Care Tool Care Tool Cognition Ability to hear (with hearing aid or hearing appliances if normally used Ability to hear (with hearing aid or hearing appliances if normally used): 0. Adequate - no difficulty in normal conservation, social interaction, listening to TV   Expression of Ideas and Wants Expression of Ideas and Wants: 3. Some difficulty - exhibits some difficulty with expressing needs and ideas (e.g, some words or finishing thoughts) or speech is not clear   Understanding Verbal and Non-Verbal Content Understanding Verbal and Non-Verbal Content: 3. Usually understands - understands most conversations, but misses some part/intent of message. Requires cues at times to understand  Memory/Recall Ability Memory/Recall Ability : Current season;That he or she is in a hospital/hospital unit   Intelligibility: Intelligible  Short Term Goals: Week 1: SLP Short Term Goal 1 (Week 1): STG=LTG due to ELOS  Refer to Care Plan for Long Term Goals  Recommendations for other services: Neuropsych  Discharge Criteria: Patient will be discharged from SLP if patient refuses treatment 3 consecutive times without medical  reason, if treatment goals not met, if there is a change in medical status, if patient makes no progress towards goals or if patient is discharged from hospital.  The above assessment, treatment plan, treatment alternatives and goals were discussed and mutually agreed upon: by patient and by family  Patty Sermons 03/24/2022, 5:10 PM

## 2022-03-24 NOTE — Discharge Instructions (Signed)
Inpatient Rehab Discharge Instructions  Shirley Nichols Discharge date and time: No discharge date for patient encounter.   Activities/Precautions/ Functional Status: Activity: activity as tolerated Diet: regular diet Wound Care: Routine skin checks Functional status:  ___ No restrictions     ___ Walk up steps independently ___ 24/7 supervision/assistance   ___ Walk up steps with assistance ___ Intermittent supervision/assistance  ___ Bathe/dress independently ___ Walk with walker     __x_ Bathe/dress with assistance ___ Walk Independently    ___ Shower independently ___ Walk with assistance    ___ Shower with assistance ___ No alcohol     ___ Return to work/school ________  Special Instructions:  No driving smoking or alcohol  My questions have been answered and I understand these instructions. I will adhere to these goals and the provided educational materials after my discharge from the hospital.  Patient/Caregiver Signature _______________________________ Date __________  Clinician Signature _______________________________________ Date __________  Please bring this form and your medication list with you to all your follow-up doctor's appointments.

## 2022-03-24 NOTE — Plan of Care (Signed)
Problem: RH Balance Goal: LTG Patient will maintain dynamic standing with ADLs (OT) Description: LTG:  Patient will maintain dynamic standing balance with assist during activities of daily living (OT)  Flowsheets (Taken 03/24/2022 1210) LTG: Pt will maintain dynamic standing balance during ADLs with: Independent with assistive device   Problem: Sit to Stand Goal: LTG:  Patient will perform sit to stand in prep for activites of daily living with assistance level (OT) Description: LTG:  Patient will perform sit to stand in prep for activites of daily living with assistance level (OT) Flowsheets (Taken 03/24/2022 1210) LTG: PT will perform sit to stand in prep for activites of daily living with assistance level: Independent with assistive device   Problem: RH Grooming Goal: LTG Patient will perform grooming w/assist,cues/equip (OT) Description: LTG: Patient will perform grooming with assist, with/without cues using equipment (OT) Flowsheets (Taken 03/24/2022 1210) LTG: Pt will perform grooming with assistance level of: Independent with assistive device    Problem: RH Bathing Goal: LTG Patient will bathe all body parts with assist levels (OT) Description: LTG: Patient will bathe all body parts with assist levels (OT) Flowsheets (Taken 03/24/2022 1210) LTG: Pt will perform bathing with assistance level/cueing: Independent with assistive device    Problem: RH Dressing Goal: LTG Patient will perform upper body dressing (OT) Description: LTG Patient will perform upper body dressing with assist, with/without cues (OT). Flowsheets (Taken 03/24/2022 1210) LTG: Pt will perform upper body dressing with assistance level of: Independent with assistive device Goal: LTG Patient will perform lower body dressing w/assist (OT) Description: LTG: Patient will perform lower body dressing with assist, with/without cues in positioning using equipment (OT) Flowsheets (Taken 03/24/2022 1210) LTG: Pt will  perform lower body dressing with assistance level of: Independent with assistive device   Problem: RH Toileting Goal: LTG Patient will perform toileting task (3/3 steps) with assistance level (OT) Description: LTG: Patient will perform toileting task (3/3 steps) with assistance level (OT)  Flowsheets (Taken 03/24/2022 1210) LTG: Pt will perform toileting task (3/3 steps) with assistance level: Independent with assistive device   Problem: RH Simple Meal Prep Goal: LTG Patient will perform simple meal prep w/assist (OT) Description: LTG: Patient will perform simple meal prep with assistance, with/without cues (OT). Flowsheets (Taken 03/24/2022 1210) LTG: Pt will perform simple meal prep with assistance level of: Supervision/Verbal cueing   Problem: RH Laundry Goal: LTG Patient will perform laundry w/assist, cues (OT) Description: LTG: Patient will perform laundry with assistance, with/without cues (OT). Flowsheets (Taken 03/24/2022 1210) LTG: Pt will perform laundry with assistance level of: Supervision/Verbal cueing   Problem: RH Light Housekeeping Goal: LTG Patient will perform light housekeeping w/assist (OT) Description: LTG: Patient will perform light housekeeping with assistance, with/without cues (OT). Flowsheets (Taken 03/24/2022 1210) LTG: Pt will perform light housekeeping with assistance level of: Supervision/Verbal cueing   Problem: RH Toilet Transfers Goal: LTG Patient will perform toilet transfers w/assist (OT) Description: LTG: Patient will perform toilet transfers with assist, with/without cues using equipment (OT) Flowsheets (Taken 03/24/2022 1210) LTG: Pt will perform toilet transfers with assistance level of: Supervision/Verbal cueing   Problem: RH Tub/Shower Transfers Goal: LTG Patient will perform tub/shower transfers w/assist (OT) Description: LTG: Patient will perform tub/shower transfers with assist, with/without cues using equipment (OT) Flowsheets (Taken  03/24/2022 1210) LTG: Pt will perform tub/shower stall transfers with assistance level of: Supervision/Verbal cueing   Problem: RH Memory Goal: LTG Patient will demonstrate ability for day to day recall/carry over during activities of daily living with assistance  level (OT) Description: LTG:  Patient will demonstrate ability for day to day recall/carry over during activities of daily living with assistance level (OT). Flowsheets (Taken 03/24/2022 1210) LTG:  Patient will demonstrate ability for day to day recall/carry over during activities of daily living with assistance level (OT): Modified Independent

## 2022-03-24 NOTE — Evaluation (Signed)
Physical Therapy Assessment and Plan  Patient Details  Name: BRODI NERY MRN: [id number] Date of Birth: Dec 30, 1940  PT Diagnosis: Abnormality of gait, Difficulty walking, Edema, Impaired cognition, and Muscle weakness Rehab Potential: Excellent ELOS: 7-10 days   Today's Date: 03/24/2022 PT Individual Time: 0263-7858 PT Individual Time Calculation (min): 61 min    Hospital Problem: Principal Problem:   Toxic metabolic encephalopathy   Past Medical History:  Past Medical History:  Diagnosis Date   Cancer ([place])    skin cancer   Hyperlipidemia    Hypertension    Past Surgical History:  Past Surgical History:  Procedure Laterality Date   ABDOMINAL HYSTERECTOMY     BILIARY BRUSHING  03/05/2022   Procedure: BILIARY BRUSHING;  Surgeon: Carol Ada, MD;  Location: [facility] ENDOSCOPY;  Service: Gastroenterology;;   BILIARY STENT PLACEMENT  03/05/2022   Procedure: BILIARY STENT PLACEMENT;  Surgeon: Carol Ada, MD;  Location: [facility] ENDOSCOPY;  Service: Gastroenterology;;   COLONOSCOPY     02/21/2002 adenomatous polyp, 03/23/2011   COLONOSCOPY W/ POLYPECTOMY  02/2000   TA polyp removed from ascending colon.  performed through [facility] (? affiliate in [place])   COLONOSCOPY WITH PROPOFOL N/A 11/03/2016   Procedure: COLONOSCOPY WITH PROPOFOL;  Surgeon: Manya Silvas, MD;  Location: [facility] ENDOSCOPY;  Service: Endoscopy;  Laterality: N/A;   ERCP N/A 03/05/2022   Procedure: ENDOSCOPIC RETROGRADE CHOLANGIOPANCREATOGRAPHY (ERCP);  Surgeon: Carol Ada, MD;  Location: [place];  Service: Gastroenterology;  Laterality: N/A;   HEMORROIDECTOMY     TONSILLECTOMY      Assessment & Plan Clinical Impression: Dayton Sherr is a 81 year old right-handed female with history of hypertension, hyperlipidemia as well as history of tobacco use.  Per chart review patient lives with spouse.  1 level home with a few steps to entry.  Independent and active prior to admission.  Presented 03/03/2022 to  the emergency department [facility] for evaluation of elevated LFTs on outpatient blood work.  By report patient had seen her PCP in August 2023 due to generalized weakness blood work was obtained patient notified of elevated LFTs.  Blood work noted BUN 57 creatinine 2.46 alkaline phosphatase 739 AST 188 ALT 229 total bilirubin 5.1 and lipase 342.  MRCP revealed 3.4 cm masslike density in the pancreatic head suspicious for pancreatic carcinoma with distal CBD stricture and diffuse biliary ductal dilatation.  GI services Dr. Vicente Males was consulted by the ED physician and patient was transferred to [facility] for further evaluation.  Underwent ERCP 03/05/2022 per Dr. Benson Norway that showed the upper third of the main bile duct and middle third of the main bile duct were dilated, with a mass causing an obstruction and underwent sphincterotomy and biliary stent placement with brush biopsy.  Hospital course complicated by acute respiratory failure requiring short-term intubation.  Bouts of confusion altered mental status as well as shaking with right lower extremity jerking and CT/MRI showed no metastatic disease or acute intracranial abnormality.  LP completed that was unrevealing.  EEG negative for seizure and patient did remain on Keppra as well as Dilantin..  She did receive consult by ophthalmology for blurry vision OU with work-up unremarkable felt to be related to toxic metabolic encephalopathy.  LFTs continue to trend downward.  Total bilirubin has normalized.  No current plans for repeat of ERCP.  Follow-up of AKI question CKD maintain on gentle IV fluids with latest creatinine 1.07 as well as persistent hypokalemia with IV supplement ongoing and latest potassium 2.9.  Oncology services Dr. Alvy Bimler  consulted for suspect pancreatic cancer and CT of the chest completed 03/11/2022 showing a 1.8 cm hypervascular subcapsular lesion in the dome of the liver that was not visible on MRI 03/03/2022 felt to be related to perfusion  anomaly given the history of suspect pancreatic cancer recommend ongoing follow-up.  Incidental finding of 2.1 cm right thyroid nodule as well as stable left adrenal nodule and current plan is to address chemotherapy as an outpatient.  Palliative care has been consulted to establish goals of care.  Lovenox initiated for DVT prophylaxis 03/19/2022.  Therapy evaluations completed due to patient decreased functional mobility was admitted for a comprehensive rehab program. Patient transferred to CIR on 03/23/2022 .   Patient currently requires min with mobility secondary to muscle weakness, decreased cardiorespiratoy endurance, and decreased balance strategies.  Prior to hospitalization, patient was independent  with mobility and lived with Spouse in a House home.  Home access is 3Stairs to enter.  Patient will benefit from skilled PT intervention to maximize safe functional mobility, minimize fall risk, and decrease caregiver burden for planned discharge home with 24 hour supervision.  Anticipate patient will benefit from follow up OP at discharge.  PT - End of Session Activity Tolerance: Tolerates 30+ min activity with multiple rests Endurance Deficit: Yes Endurance Deficit Description: Pt reports eztreme fatigue PT Assessment Rehab Potential (ACUTE/IP ONLY): Excellent PT Barriers to Discharge: Home environment access/layout PT Patient demonstrates impairments in the following area(s): Balance;Safety;Edema;Endurance PT Transfers Functional Problem(s): Bed Mobility;Bed to Chair;Car;Furniture PT Locomotion Functional Problem(s): Ambulation;Stairs PT Plan PT Intensity: Minimum of 1-2 x/day ,45 to 90 minutes PT Frequency: 5 out of 7 days PT Duration Estimated Length of Stay: 7-10 days PT Treatment/Interventions: Ambulation/gait training;Cognitive remediation/compensation;Discharge planning;DME/adaptive equipment instruction;Functional mobility training;Pain management;Psychosocial  support;Splinting/orthotics;Therapeutic Activities;UE/LE Strength taining/ROM;Visual/perceptual remediation/compensation;Wheelchair propulsion/positioning;UE/LE Coordination activities;Therapeutic Exercise;Stair training;Skin care/wound management;Patient/family education;Neuromuscular re-education;Functional electrical stimulation;Disease management/prevention;Community reintegration;Balance/vestibular training PT Transfers Anticipated Outcome(s): mod I bed chair transfer PT Locomotion Anticipated Outcome(s): supervision gait PT Recommendation Recommendations for Other Services: Speech consult Follow Up Recommendations: Outpatient PT Patient destination: Home Equipment Recommended: To be determined   PT Evaluation Precautions/Restrictions Precautions Precautions: Fall Restrictions Weight Bearing Restrictions: No General PT Amount of Missed Time (min): 15 Minutes PT Missed Treatment Reason: Patient fatigue Vital SignsTherapy Vitals Pulse Rate: (!) 112 Pain   Pain Interference Pain Interference Pain Effect on Sleep: 0. Does not apply - I have not had any pain or hurting in the past 5 days Pain Interference with Therapy Activities: 0. Does not apply - I have not received rehabilitationtherapy in the past 5 days Pain Interference with Day-to-Day Activities: 1. Rarely or not at all Home Living/Prior [place]: Spouse/significant other Available Help at Discharge: Family Type of Home: House Home Access: Stairs to enter Technical brewer of Steps: 3 Entrance Stairs-Rails: Left Home Layout: One level  Lives With: Spouse Prior Function Level of Independence: Independent with gait;Independent with transfers  Able to Take Stairs?: Yes Driving: Yes Vocation: Retired Vision/Perception  Vision - History Ability to See in Adequate Light: 0 Adequate Perception Perception: Within Functional Limits Praxis Praxis: Intact  Cognition Orientation  Level: Oriented to person;Oriented to place;Oriented to situation Year: 2023 Month: October Awareness: Appears intact Safety/Judgment: Appears intact Sensation Sensation Light Touch: Appears Intact Coordination Gross Motor Movements are Fluid and Coordinated: No Coordination and Movement Description: grossly uncoodinated d/t generalized weakness Motor  Motor Motor: Within Functional Limits Motor - Skilled Clinical Observations: Pt slow/careful but [facility]   Trunk/Postural Assessment  Cervical Assessment  Cervical Assessment: Within Functional Limits Thoracic Assessment Thoracic Assessment: Within Functional Limits Lumbar Assessment Lumbar Assessment: Within Functional Limits Postural Control Postural Control: Within Functional Limits  Balance Balance Balance Assessed: Yes Static Sitting Balance Static Sitting - Balance Support: Feet unsupported Static Sitting - Level of Assistance: 5: Stand by assistance Dynamic Sitting Balance Dynamic Sitting - Balance Support: Feet supported Dynamic Sitting - Level of Assistance: 5: Stand by assistance Static Standing Balance Static Standing - Balance Support: During functional activity Static Standing - Level of Assistance: 4: Min assist Dynamic Standing Balance Dynamic Standing - Balance Support: During functional activity Dynamic Standing - Level of Assistance: 4: Min assist Extremity Assessment      RLE Assessment RLE Assessment: Exceptions to [facility] General Strength Comments: Grossly 4/5 LLE Assessment LLE Assessment: Exceptions to [facility] General Strength Comments: grossly 4/5  Care Tool Care Tool Bed Mobility Roll left and right activity   Roll left and right assist level: Supervision/Verbal cueing    Sit to lying activity   Sit to lying assist level: Supervision/Verbal cueing    Lying to sitting on side of bed activity   Lying to sitting on side of bed assist level: the ability to move from lying on the back to sitting on the  side of the bed with no back support.: Supervision/Verbal cueing     Care Tool Transfers Sit to stand transfer   Sit to stand assist level: Contact Guard/Touching assist    Chair/bed transfer   Chair/bed transfer assist level: Contact Guard/Touching assist     Toilet transfer   Assist Level: Minimal Assistance - Patient > 75%    Car transfer   Car transfer assist level: Contact Guard/Touching assist      Care Tool Locomotion Ambulation   Assist level: Contact Guard/Touching assist Assistive device: No Device Max distance: 210 ft  Walk 10 feet activity   Assist level: Contact Guard/Touching assist Assistive device: No Device   Walk 50 feet with 2 turns activity   Assist level: Contact Guard/Touching assist Assistive device: No Device  Walk 150 feet activity   Assist level: Contact Guard/Touching assist Assistive device: No Device  Walk 10 feet on uneven surfaces activity   Assist level: Contact Guard/Touching assist Assistive device: Walker-rolling  Stairs   Assist level: Contact Guard/Touching assist Stairs assistive device: 2 hand rails Max number of stairs: 12  Walk up/down 1 step activity   Walk up/down 1 step (curb) assist level: Contact Guard/Touching assist Walk up/down 1 step or curb assistive device: 2 hand rails  Walk up/down 4 steps activity   Walk up/down 4 steps assist level: Contact Guard/Touching assist Walk up/down 4 steps assistive device: 2 hand rails  Walk up/down 12 steps activity   Walk up/down 12 steps assist level: Contact Guard/Touching assist Walk up/down 12 steps assistive device: 2 hand rails  Pick up small objects from floor Pick up small object from the floor (from standing position) activity did not occur: Safety/medical concerns      Wheelchair Is the patient using a wheelchair?: Yes Type of Wheelchair: Manual   Wheelchair assist level: Dependent - Patient 0% (transport only this session)    Wheel 50 feet with 2 turns activity       Wheel 150 feet activity        Refer to Care Plan for Long Term Goals  SHORT TERM GOAL WEEK 1 PT Short Term Goal 1 (Week 1): =LTGs d/t ELOS  Recommendations for other services: None   Skilled  Therapeutic Intervention Evaluation completed (see details above) with patient education regarding purpose of PT evaluation, PT POC and goals, therapy schedule, weekly team meetings, and other CIR information including safety plan and fall risk safety. Pt mobilizes with CGA level of assist overall. Pt performed the below functional mobility tasks with the specified levels of skilled cuing and assistance. Pt also performed 6MWT and FTSS as documented below. Pt returned to bed with CGA after session, reporting extreme fatigue. Pt missed x 15 min d/t fatigue. Will attempt to make up as able. Pt remained in bed with husband present, was left with all needs in reach and alarm active.    6 Min Walk Test:  Instructed patient to ambulate as quickly and as safely as possible for 6 minutes using LRAD. Patient was allowed to take standing rest breaks without stopping the test, but if the patient required a sitting rest break the clock would be stopped and the test would be over.  Results: 210 feet (70 meters, Avg speed .12ms) using no AD with CGA. Results indicate that the patient has reduced endurance with ambulation compared to age matched norms.  Age Matched Norms: 632-69yo M: 595F: 586 786-79yo M: 549F: 416 852-89yo M: 436F: 392 MDC: 58.21 meters (190.98 feet) or 50 meters (ANPTA Core Set of Outcome Measures for Adults with Neurologic Conditions, 2018)   Five times Sit to Stand Test (FTSS) Method: Use a straight back chair with a solid seat that is 16-18" high. Ask participant to sit on the chair with arms folded across their chest.   Instructions: "Stand up and sit down as quickly as possible 5 times, keeping your arms folded across your chest."   Measurement: Stop timing when the participant stands  the 5th time.  TIME: ___19___ (in seconds)  Times > 13.6 seconds is associated with increased disability and morbidity (Guralnik, 2000) Times > 15 seconds is predictive of recurrent falls in healthy individuals aged 678and older (Buatois, et al., 2008) Normal performance values in community dwelling individuals aged 622and older (Bohannon, 2006): 60-69 years: 11.4 seconds 70-79 years: 12.6 seconds 80-89 years: 14.8 seconds  MCID: ? 2.3 seconds for Vestibular Disorders (Meretta, 2006)     Mobility Bed Mobility Bed Mobility: Supine to Sit;Sit to Supine Supine to Sit: Supervision/Verbal cueing Sit to Supine: Supervision/Verbal cueing Transfers Transfers: Sit to Stand;Stand Pivot Transfers Sit to Stand: Contact Guard/Touching assist Stand Pivot Transfers: Contact Guard/Touching assist Transfer (Assistive device): None Locomotion  Gait Ambulation: Yes Gait Assistance: Contact Guard/Touching assist Gait Distance (Feet): 210 Feet Assistive device: None Gait Gait: Yes Gait Pattern: Within Functional Limits Gait velocity: decr Stairs / Additional Locomotion Stairs: Yes Stairs Assistance: Contact Guard/Touching assist Stair Management Technique: Two rails Number of Stairs: 12 Height of Stairs: 6 Ramp: Contact Guard/touching assist Wheelchair Mobility Wheelchair Mobility: No   Discharge Criteria: Patient will be discharged from PT if patient refuses treatment 3 consecutive times without medical reason, if treatment goals not met, if there is a change in medical status, if patient makes no progress towards goals or if patient is discharged from hospital.  The above assessment, treatment plan, treatment alternatives and goals were discussed and mutually agreed upon: by patient  OMickel Fuchs10/27/2023, 1:01 PM

## 2022-03-24 NOTE — Progress Notes (Signed)
Inpatient Rehabilitation Care Coordinator Assessment and Plan Patient Details  Name: Shirley Nichols MRN: [id number] Date of Birth: [birth year]  Today's Date: 03/24/2022  Hospital Problems: Principal Problem:   Toxic metabolic encephalopathy  Past Medical History:  Past Medical History:  Diagnosis Date   Cancer ([place])    skin cancer   Hyperlipidemia    Hypertension    Past Surgical History:  Past Surgical History:  Procedure Laterality Date   ABDOMINAL HYSTERECTOMY     BILIARY BRUSHING  03/05/2022   Procedure: BILIARY BRUSHING;  Surgeon: Carol Ada, MD;  Location: [facility] ENDOSCOPY;  Service: Gastroenterology;;   BILIARY STENT PLACEMENT  03/05/2022   Procedure: BILIARY STENT PLACEMENT;  Surgeon: Carol Ada, MD;  Location: [facility] ENDOSCOPY;  Service: Gastroenterology;;   COLONOSCOPY     02/21/2002 adenomatous polyp, 03/23/2011   COLONOSCOPY W/ POLYPECTOMY  02/2000   TA polyp removed from ascending colon.  performed through [facility] (? affiliate in [place])   COLONOSCOPY WITH PROPOFOL N/A 11/03/2016   Procedure: COLONOSCOPY WITH PROPOFOL;  Surgeon: Manya Silvas, MD;  Location: [facility] ENDOSCOPY;  Service: Endoscopy;  Laterality: N/A;   ERCP N/A 03/05/2022   Procedure: ENDOSCOPIC RETROGRADE CHOLANGIOPANCREATOGRAPHY (ERCP);  Surgeon: Carol Ada, MD;  Location: [place];  Service: Gastroenterology;  Laterality: N/A;   HEMORROIDECTOMY     TONSILLECTOMY     Social History:  reports that she has quit smoking. She has never used smokeless tobacco. She reports that she does not drink alcohol and does not use drugs.  Family / Support Systems Marital Status: Married Patient Roles: Spouse, Parent Spouse/Significant Other: Bobby [phone]  [phone] Children: Stephanie-daughter [phone]-local and another daughter who is here from [place] to assist Dad Other Supports: Friends and church members Anticipated Caregiver: Bobby and daughter's Ability/Limitations of Caregiver:  Mortimer Fries works part time but can stop if needed Caregiver Availability: 24/7 Family Dynamics: Close kint with daughter's and family, has good set of friends and church members. Both feel they have good supports.  Social History Preferred language: English Religion: Presbyterian Cultural Background: No issues Education: [place] - How often do you need to have someone help you when you read instructions, pamphlets, or other written material from your doctor or pharmacy?: Never Writes: Yes Employment Status: Retired Public relations account executive Issues: No issues Guardian/Conservator: None-according to MD pt is not capable of making her decisions will look toward her husband to make any decisions that come up during her stay. He plans to be here daily to support her   Abuse/Neglect Abuse/Neglect Assessment Can Be Completed: Yes Physical Abuse: Denies Verbal Abuse: Denies Sexual Abuse: Denies Exploitation of patient/patient's resources: Denies Self-Neglect: Denies  Patient response to: Social Isolation - How often do you feel lonely or isolated from those around you?: Never  Emotional Status Pt's affect, behavior and adjustment status: Pt is motivated to do well and get stronger so she can do treatment. Pt was independent prior to admission and hopes to be as close as she can to this at discharge. Husband reports she likes to do for herself and not bother others. Recent Psychosocial Issues: other health issues quick decline with all of this happening Psychiatric History: No issues may benefit from seeing neuro-psych while here for support and coping Substance Abuse History: No issues  Patient / Family Perceptions, Expectations & Goals Pt/Family understanding of illness & functional limitations: Pt and husband can explain her issues-pancreatic mass, seizures and liver issues. They do talk with the MD daily and feel they have a  good understanding of her treatment plan. Husaband  reports he has spoken with 16 doctors since coming in three weeks ago. Premorbid pt/family roles/activities: Wife,mom, retiree, church member, friend, neighbor, etc Anticipated changes in roles/activities/participation: resume Pt/family expectations/goals: Pt states: " I hope to do well and be able to do for myself when I leave here."  Husband states: " I will assist she knows this, but know how independent she is."  [employer]: None Premorbid Home Care/DME Agencies: Other (Comment) (rw) Transportation available at discharge: husband Is the patient able to respond to transportation needs?: Yes In the past 12 months, has lack of transportation kept you from medical appointments or from getting medications?: No In the past 12 months, has lack of transportation kept you from meetings, work, or from getting things needed for daily living?: No Resource referrals recommended: Neuropsychology  Discharge Planning Living Arrangements: Spouse/significant other Support Systems: Spouse/significant other, Children, Friends/neighbors, Social worker community Type of Residence: Private residence Insurance Resources: Multimedia programmer (specify) (Health Team Advantage) Financial Resources: Social Security, Family Support Financial Screen Referred: No Living Expenses: Own Money Management: Patient, Spouse Does the patient have any problems obtaining your medications?: No Home Management: both wife was doing the inside work and husband does the outside work Magazine features editor Plans: Return home with husband who is able to assist, their daughter is here from [place] to assist dad and provide support. Their other daughter is local and will be assisting also if needed. Aware being evaluated by therapies and goals being set. Care Coordinator Barriers to Discharge: Insurance for SNF coverage Care Coordinator Anticipated Follow Up Needs: HH/OP  Clinical Impression Pleasant  female who is motivated to do well and recover so can be strong enough to do treatments needed for cancer. Husband is a strong support of pt and will be here daily along with their daughter's. Will work on discharge needs and await therapy evaluations. Would benefit from seeing neuro-psych while here  Elease Hashimoto 03/24/2022, 10:40 AM

## 2022-03-24 NOTE — Progress Notes (Signed)
PROGRESS NOTE   Subjective/Complaints:   Pt reports no pain-  much at all-  :B< last night Not hungry- esp for breakfast- ate <10%.  Per husband, sitting up and more alert than has been.  Pt admits to being a little cold.     ROS:  Pt denies SOB, abd pain, CP, N/V/C/D, and vision changes   Objective:   No results found. Recent Labs    03/23/22 0245 03/24/22 0548  WBC 6.2 5.3  HGB 8.1* 8.0*  HCT 24.5* 24.9*  PLT 317 301   Recent Labs    03/23/22 0245 03/24/22 0548  NA 134* 137  K 2.9* 3.4*  CL 98 102  CO2 27 25  GLUCOSE 148* 100*  BUN 24* 20  CREATININE 1.10* 0.99  CALCIUM 8.0* 8.1*    Intake/Output Summary (Last 24 hours) at 03/24/2022 1332 Last data filed at 03/24/2022 3664 Gross per 24 hour  Intake 118 ml  Output --  Net 118 ml        Physical Exam: Vital Signs Blood pressure (!) 160/73, pulse (!) 112, temperature 98.1 F (36.7 C), resp. rate 16, height '5\' 4"'$  (1.626 m), weight 59.8 kg, SpO2 97 %.   General: awake, alert, appropriate, quiet; husband of 60+ years at bedside; head in head wrapping; NAD HENT: conjugate gaze; oropharynx moist CV: tachycardic- mild- rate; no JVD Pulmonary: CTA B/L; no W/R/R- good air movement GI: soft, NT, ND, (+)BS Psychiatric: appropriate Neurological: Ox2-3- slightly delayed responses  MSK:      No apparent deformity.      Strength: 5-/5 in bilateral upper and lower extremities throughout   Neurologic exam:  Cognition: AAO to person, place, time; not oriented to event Language: Fluent, No substitutions or neoglisms. No dysarthria. Names 3/3 objects correctly.  Memory: Recalls 0/3 objects at 5 minutes. Defers to husband on most memory tasks Insight: Poor insight into current condition.  Mood: Pleasant affect, appropriate mood.  Sensation: To light touch intact in BL UEs and LEs  Reflexes: 2+ in BL UE and LEs. Negative Hoffman's and babinski signs  bilaterally.  CN: 2-12 grossly intact.  Coordination: No apparent tremors. No ataxia on FTN, HTS bilaterally.  Spasticity: MAS 0 in all extremities.  Gait: Not observed    Assessment/Plan: 1. Functional deficits which require 3+ hours per day of interdisciplinary therapy in a comprehensive inpatient rehab setting. Physiatrist is providing close team supervision and 24 hour management of active medical problems listed below. Physiatrist and rehab team continue to assess barriers to discharge/monitor patient progress toward functional and medical goals  Care Tool:  Bathing    Body parts bathed by patient: Right arm, Left arm, Chest, Abdomen, Front perineal area, Left upper leg, Right upper leg, Face   Body parts bathed by helper: Buttocks, Right lower leg, Left lower leg     Bathing assist Assist Level: Moderate Assistance - Patient 50 - 74%     Upper Body Dressing/Undressing Upper body dressing   What is the patient wearing?: Dress    Upper body assist Assist Level: Minimal Assistance - Patient > 75%    Lower Body Dressing/Undressing Lower body dressing  What is the patient wearing?: Incontinence brief     Lower body assist Assist for lower body dressing: Moderate Assistance - Patient 50 - 74%     Toileting Toileting    Toileting assist Assist for toileting: Moderate Assistance - Patient 50 - 74%     Transfers Chair/bed transfer  Transfers assist     Chair/bed transfer assist level: Contact Guard/Touching assist     Locomotion Ambulation   Ambulation assist      Assist level: Contact Guard/Touching assist Assistive device: No Device Max distance: 210 ft   Walk 10 feet activity   Assist     Assist level: Contact Guard/Touching assist Assistive device: No Device   Walk 50 feet activity   Assist    Assist level: Contact Guard/Touching assist Assistive device: No Device    Walk 150 feet activity   Assist    Assist level: Contact  Guard/Touching assist Assistive device: No Device    Walk 10 feet on uneven surface  activity   Assist     Assist level: Contact Guard/Touching assist Assistive device: Walker-rolling   Wheelchair     Assist Is the patient using a wheelchair?: Yes Type of Wheelchair: Manual    Wheelchair assist level: Dependent - Patient 0% (transport only this session)      Wheelchair 50 feet with 2 turns activity    Assist            Wheelchair 150 feet activity     Assist          Blood pressure (!) 160/73, pulse (!) 112, temperature 98.1 F (36.7 C), resp. rate 16, height '5\' 4"'$  (1.626 m), weight 59.8 kg, SpO2 97 %.  Medical Problem List and Plan: 1. 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  EEG negative.  Continue Keppra 750 mg twice daily and Dilantin 300 mg nightly             -patient may shower             -ELOS/Goals: 10-14 days, Mod I PT/OT, I SLP goals  Con't CIR- first day of evaluations- PT, OT and SLP- pt doesn't want to stay longer than ~ 10 days, which if possible, I agree with, so she can start Cancer treatment.  2.  Antithrombotics: -DVT/anticoagulation:  Pharmaceutical: Lovenox             -antiplatelet therapy: N/A   3. Pain Management: Oxycodone as needed  10/27- denies pain- con't meds prn 4. Mood/Behavior/Sleep: Provide emotional support             -antipsychotic agents: N/A 5. Neuropsych/cognition: This patient is not capable of making decisions on her own behalf. 6. Skin/Wound Care: Routine skin checks 7. Fluids/Electrolytes/Nutrition (hypokalemia, hypomagnasemia): Routine in and outs with follow-up chemistries           - Receiving intermittent IV K and Mag repletion; check admission labs and then would start on standing supplementation    10/27- pt's Mg is 1.2 and K_ 3.4- will replete with 4 G IV Mg and 20 mEq x2 of K+ and recheck in AM 8.  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  Status post ERCP with stenting 03/05/2022 per Dr. Benson [place].  LFTs improving with latest total bilirubin 0.7.              - Follow-up outpatient oncology services [place] for Recs regarding chemotherapy   10/27- pt would like to get out of  rehab "as soon as possible"- doesn't want to be here more than ~ 10 days 9.  AKI.   Follow-up chemistries  10/27- doing better 10.  Constipation.  MiraLAX daily as well as Senokot-S tablet twice daily  10/27- LBM last night-     I spent a total of  36  minutes on total care today- >50% coordination of care- due to d/w PA and pharmacy about mg/K+ dosing; as well as IPOC   LOS: 1 days A FACE TO FACE EVALUATION WAS PERFORMED  Annaka Cleaver 03/24/2022, 1:32 PM

## 2022-03-24 NOTE — Progress Notes (Signed)
Inpatient Rehabilitation  Patient information reviewed and entered into e[facility] system by Craig Wisnewski M. Alajia Schmelzer, M.A., CCC/SLP, PPS Coordinator.  Information including medical coding, functional ability and quality indicators will be reviewed and updated through discharge.    

## 2022-03-24 NOTE — Plan of Care (Signed)
  Problem: RH Problem Solving Goal: LTG Patient will demonstrate problem solving for (SLP) Description: LTG:  Patient will demonstrate problem solving for basic/complex daily situations with cues  (SLP) Flowsheets (Taken 03/24/2022 1708) LTG: Patient will demonstrate problem solving for (SLP): Basic daily situations LTG Patient will demonstrate problem solving for:  Supervision  Minimal Assistance - Patient > 75%   Problem: RH Memory Goal: LTG Patient will use memory compensatory aids to (SLP) Description: LTG:  Patient will use memory compensatory aids to recall biographical/new, daily complex information with cues (SLP) Flowsheets (Taken 03/24/2022 1708) LTG: Patient will use memory compensatory aids to (SLP): Supervision   Problem: RH Attention Goal: LTG Patient will demonstrate this level of attention during functional activites (SLP) Description: LTG:  Patient will will demonstrate this level of attention during functional activites (SLP) Flowsheets (Taken 03/24/2022 1708) Patient will demonstrate during cognitive/linguistic activities the attention type of: Sustained LTG: Patient will demonstrate this level of attention during cognitive/linguistic activities with assistance of (SLP): Supervision Number of minutes patient will demonstrate attention during cognitive/linguistic activities: 15   Problem: RH Awareness Goal: LTG: Patient will demonstrate awareness during functional activites type of (SLP) Description: LTG: Patient will demonstrate awareness during functional activites type of (SLP) Flowsheets (Taken 03/24/2022 1708) Patient will demonstrate during cognitive/linguistic activities awareness type of: Anticipatory LTG: Patient will demonstrate awareness during cognitive/linguistic activities with assistance of (SLP): Supervision

## 2022-03-24 NOTE — Evaluation (Addendum)
Occupational Therapy Assessment and Plan  Patient Details  Name: LISAANN ATHA MRN: [id number] Date of Birth: March 11, 1941  OT Diagnosis: abnormal posture, altered mental status, cognitive deficits, and muscle weakness (generalized) Rehab Potential: Rehab Potential (ACUTE ONLY): Good ELOS: 7-10 days   Today's Date: 03/24/2022 OT Individual Time: 7680-8811 OT Individual Time Calculation (min): 75 min     Hospital Problem: Principal Problem:   Toxic metabolic encephalopathy   Past Medical History:  Past Medical History:  Diagnosis Date   Cancer ([place])    skin cancer   Hyperlipidemia    Hypertension    Past Surgical History:  Past Surgical History:  Procedure Laterality Date   ABDOMINAL HYSTERECTOMY     BILIARY BRUSHING  03/05/2022   Procedure: BILIARY BRUSHING;  Surgeon: Carol Ada, MD;  Location: [place];  Service: Gastroenterology;;   BILIARY STENT PLACEMENT  03/05/2022   Procedure: BILIARY STENT PLACEMENT;  Surgeon: Carol Ada, MD;  Location: [place];  Service: Gastroenterology;;   COLONOSCOPY     02/21/2002 adenomatous polyp, 03/23/2011   COLONOSCOPY W/ POLYPECTOMY  02/2000   TA polyp removed from ascending colon.  performed through [facility] (? affiliate in [place])   COLONOSCOPY WITH PROPOFOL N/A 11/03/2016   Procedure: COLONOSCOPY WITH PROPOFOL;  Surgeon: Manya Silvas, MD;  Location: [facility] END[facility]COPY;  Service: Endoscopy;  Laterality: N/A;   ERCP N/A 03/05/2022   Procedure: ENDOSCOPIC RETROGRADE CHOLANGIOPANCREATOGRAPHY (ERCP);  Surgeon: Carol Ada, MD;  Location: [place];  Service: Gastroenterology;  Laterality: N/A;   HEMORROIDECTOMY     TONSILLECTOMY      Assessment & Plan Clinical Impression: Cammy Sanjurjo is a 81 year old right-handed female with history of hypertension, hyperlipidemia as well as history of tobacco use.  Per chart review patient lives with spouse.  1 level home with a few steps to entry.  Independent and active prior  to admission.  Presented 03/03/2022 to the emergency department [facility] for evaluation of elevated LFTs on outpatient blood work.  By report patient had seen her PCP in August 2023 due to generalized weakness blood work was obtained patient notified of elevated LFTs.  Blood work noted BUN 57 creatinine 2.46 alkaline phosphatase 739 AST 188 ALT 229 total bilirubin 5.1 and lipase 342.  MRCP revealed 3.4 cm masslike density in the pancreatic head suspicious for pancreatic carcinoma with distal CBD stricture and diffuse biliary ductal dilatation.  GI services Dr. Vicente Males was consulted by the ED physician and patient was transferred to [facility] for further evaluation.  Underwent ERCP 03/05/2022 per Dr. Benson Norway that showed the upper third of the main bile duct and middle third of the main bile duct were dilated, with a mass causing an obstruction and underwent sphincterotomy and biliary stent placement with brush biopsy.  Hospital course complicated by acute respiratory failure requiring short-term intubation.  Bouts of confusion altered mental status as well as shaking with right lower extremity jerking and CT/MRI showed no metastatic disease or acute intracranial abnormality.  LP completed that was unrevealing.  EEG negative for seizure and patient did remain on Keppra as well as Dilantin..  She did receive consult by ophthalmology for blurry vision OU with work-up unremarkable felt to be related to toxic metabolic encephalopathy.  LFTs continue to trend downward.  Total bilirubin has normalized.  No current plans for repeat of ERCP.  Follow-up of AKI question CKD maintain on gentle IV fluids with latest creatinine 1.07 as well as persistent hypokalemia with IV supplement ongoing and latest potassium 2.9.  Oncology services Dr. Alvy Bimler consulted for suspect pancreatic cancer and CT of the chest completed 03/11/2022 showing a 1.8 cm hypervascular subcapsular lesion in the dome of the liver that was not visible on MRI  03/03/2022 felt to be related to perfusion anomaly given the history of suspect pancreatic cancer recommend ongoing follow-up.  Incidental finding of 2.1 cm right thyroid nodule as well as stable left adrenal nodule and current plan is to address chemotherapy as an outpatient.  Palliative care has been consulted to establish goals of care.  Lovenox initiated for DVT prophylaxis 03/19/2022.  Therapy evaluations completed due to patient decreased functional mobility was admitted for a comprehensive rehab program transferred to CIR on 03/23/22.   Patient currently requires min- mod A with basic self-care skills and IADL secondary to muscle weakness, decreased cardiorespiratoy endurance, unbalanced muscle activation, decreased coordination, and decreased motor planning, decreased awareness, decreased problem solving, decreased safety awareness, decreased memory, and delayed processing, and decreased sitting balance, decreased standing balance, decreased postural control, and decreased balance strategies.  Prior to hospitalization, patient could complete all aspects of ADL's, IADL's incl driving with independent  level.   Patient will benefit from skilled intervention to decrease level of assist with basic self-care skills, increase independence with basic self-care skills, and increase level of independence with iADL prior to discharge home with care partner.  Anticipate patient will require 24 hour supervision and follow up home health.  OT - End of Session Activity Tolerance: Tolerates 10 - 20 min activity with multiple rests Endurance Deficit: Yes Endurance Deficit Description: Pt reports eztreme fatigue OT Assessment Rehab Potential (ACUTE ONLY): Good OT Barriers to Discharge: Home environment access/layout;Other (comments) OT Barriers to Discharge Comments: steps to enter, new changes in overall activity tolerance and higher level cognition OT Patient demonstrates impairments in the following area(s):  Balance;Cognition;Endurance;Motor;Safety OT Basic ADL's Functional Problem(s): Grooming;Bathing;Dressing;Toileting OT Advanced ADL's Functional Problem(s): Simple Meal Preparation;Laundry;Light Housekeeping OT Transfers Functional Problem(s): Toilet;Tub/Shower OT Additional Impairment(s): None OT Plan OT Intensity: Minimum of 1-2 x/day, 45 to 90 minutes OT Frequency: 5 out of 7 days OT Duration/Estimated Length of Stay: 7-10 days OT Treatment/Interventions: Balance/vestibular training;Disease mangement/prevention;Neuromuscular re-education;Self Care/advanced ADL retraining;Therapeutic Exercise;Wheelchair propulsion/positioning;Cognitive remediation/compensation;DME/adaptive equipment instruction;UE/LE Strength taining/ROM;Community reintegration;Patient/family education;UE/LE Coordination activities;Discharge planning;Functional mobility training;Psychosocial support;Therapeutic Activities OT Self Feeding Anticipated Outcome(s): indep OT Basic Self-Care Anticipated Outcome(s): mod Indep OT Toileting Anticipated Outcome(s): mod I OT Bathroom Transfers Anticipated Outcome(s): S OT Recommendation Recommendations for Other Services: Neuropsych consult;Therapeutic Recreation consult Therapeutic Recreation Interventions: Pet therapy;Kitchen group;Outing/community reintergration Patient destination: Home Follow Up Recommendations: Home health OT Equipment Details: has a RW, shower seat and 3 in 1 commode that was from husbands knee replacmeent last year   OT Evaluation Precautions/Restrictions  Precautions Precautions: Fall Restrictions Weight Bearing Restrictions: No General Chart Reviewed: Yes PT Missed Treatment Reason: Patient fatigue Family/Caregiver Present: Yes (husband Billie) Vital Signs Therapy Vitals Temp: 98.5 F (36.9 C) Temp Source: Oral Pulse Rate: 76 Resp: 16 BP: (!) 154/56 Patient Position (if appropriate): Lying Oxygen Therapy SpO2: 95 % O2 Device: Room  Air Pain Pain Assessment Pain Scale: 0-10 Pain Score: 0-No pain Multiple Pain Sites: No Home Living/Prior Functioning Home Living Family/patient expects to be discharged to:: Private residence Living Arrangements: Spouse/significant other Available Help at Discharge: Family Type of Home: House Home Access: Stairs to enter Technical brewer of Steps: 3 Entrance Stairs-Rails: Right, Left Home Layout: One level Bathroom Shower/Tub: Tub/shower unit, Multimedia programmer: Standard Bathroom Accessibility: Yes  Lives With: Spouse IADL  History Homemaking Responsibilities: Yes Meal Prep Responsibility: Primary Laundry Responsibility: Primary Cleaning Responsibility: Secondary Shopping Responsibility: Primary Homemaking Comments: husband assists as well Current License: Yes Mode of Transportation: Car Occupation: Retired Type of Occupation: Therapist, art Leisure and [place]: bridge Prior Function Level of Independence: Independent with gait, Independent with transfers, Independent with basic ADLs, Independent with homemaking with ambulation  Able to Take Stairs?: Yes Driving: Yes Vocation: Retired Surveyor, mining Baseline Vision/History: 1 Wears glasses Ability to See in Adequate Light: 0 Adequate Patient Visual Report: Blurring of vision Vision Assessment?: No apparent visual deficits Eye Alignment: Within Functional Limits Ocular Range of Motion: Within Functional Limits Alignment/Gaze Preference: Within Defined Limits Perception   WFL's  Praxis Praxis: Intact Cognition Cognition Overall Cognitive Status: Impaired/Different from baseline Arousal/Alertness: Awake/alert Orientation Level: Person;Place;Situation Attention: Focused Focused Attention: Impaired Awareness: Impaired Awareness Impairment: Anticipatory impairment Problem Solving: Impaired Safety/Judgment: Impaired Comments: overall slower processing, delayed anticipatory reactions, decreased STM,  safety awareness and organziation Brief Interview for Mental Status (BIMS) Repetition of Three Words (First Attempt): 3 Temporal Orientation: Year: Correct Temporal Orientation: Month: Accurate within 5 days Temporal Orientation: Day: Correct Recall: "Sock": Yes, no cue required Recall: "Blue": Yes, no cue required Recall: "Bed": Yes, after cueing ("a piece of furniture") BIMS Summary Score: 14 Sensation Sensation Light Touch: Appears Intact Coordination Gross Motor Movements are Fluid and Coordinated: No Fine Motor Movements are Fluid and Coordinated: Yes Coordination and Movement Description: grossly uncoodinated d/t generalized weakness Finger Nose Finger Test: WFL's 9 Hole Peg Test: n/t Motor  Motor Motor: Within Functional Limits Motor - Skilled Clinical Observations: Pt slow/careful but [facility]  Trunk/Postural Assessment  Cervical Assessment Cervical Assessment: Within Functional Limits Thoracic Assessment Thoracic Assessment: Within Functional Limits Lumbar Assessment Lumbar Assessment: Within Functional Limits Postural Control Postural Control: Within Functional Limits  Balance Balance Balance Assessed: Yes Static Sitting Balance Static Sitting - Balance Support: Feet unsupported Static Sitting - Level of Assistance: 5: Stand by assistance Dynamic Sitting Balance Dynamic Sitting - Balance Support: Feet supported Dynamic Sitting - Level of Assistance: 5: Stand by assistance Static Standing Balance Static Standing - Balance Support: During functional activity Static Standing - Level of Assistance: 4: Min assist Dynamic Standing Balance Dynamic Standing - Balance Support: During functional activity Dynamic Standing - Level of Assistance: 4: Min assist Extremity/Trunk Assessment RUE Assessment RUE Assessment: Within Functional Limits LUE Assessment LUE Assessment: Within Functional Limits  Care Tool Care Tool Self Care Eating   Eating Assist Level: Set up  assist    Oral Care    Oral Care Assist Level: Contact Guard/Toucning assist    Bathing   Body parts bathed by patient: Right arm;Left arm;Chest;Abdomen;Front perineal area;Left upper leg;Right upper leg;Face Body parts bathed by helper: Buttocks;Right lower leg;Left lower leg   Assist Level: Moderate Assistance - Patient 50 - 74%    Upper Body Dressing(including orthotics)   What is the patient wearing?: Dress   Assist Level: Minimal Assistance - Patient > 75%    Lower Body Dressing (excluding footwear)   What is the patient wearing?: Incontinence brief Assist for lower body dressing: Moderate Assistance - Patient 50 - 74%    Putting on/Taking off footwear   What is the patient wearing?: Non-skid slipper socks Assist for footwear: Moderate Assistance - Patient 50 - 74%       Care Tool Toileting Toileting activity   Assist for toileting: Moderate Assistance - Patient 50 - 74%     Care Tool Bed Mobility Roll left and right activity  Roll left and right assist level: Supervision/Verbal cueing    Sit to lying activity   Sit to lying assist level: Supervision/Verbal cueing    Lying to sitting on side of bed activity   Lying to sitting on side of bed assist level: the ability to move from lying on the back to sitting on the side of the bed with no back support.: Supervision/Verbal cueing     Care Tool Transfers Sit to stand transfer   Sit to stand assist level: Contact Guard/Touching assist    Chair/bed transfer   Chair/bed transfer assist level: Contact Guard/Touching assist     Toilet transfer   Assist Level: Minimal Assistance - Patient > 75%     Care Tool Cognition  Expression of Ideas and Wants Expression of Ideas and Wants: 4. Without difficulty (complex and basic) - expresses complex messages without difficulty and with speech that is clear and easy to understand  Understanding Verbal and Non-Verbal Content Understanding Verbal and Non-Verbal Content: 3.  Usually understands - understands most conversations, but misses some part/intent of message. Requires cues at times to understand   Memory/Recall Ability Memory/Recall Ability : Current season;Location of own room;That he or she is in a hospital/hospital unit   Refer to Care Plan for [place] 1 OT Short Term Goal 1 (Week 1): STG's=LTG's d/t LOS  Recommendations for other services: Neuropsych and Therapeutic Recreation  Pet therapy and Kitchen group   Skilled Therapeutic Intervention ADL ADL Eating: Set up Where Assessed-Eating: Wheelchair Grooming: Minimal assistance Where Assessed-Grooming: Sitting at sink Upper Body Bathing: Minimal assistance Where Assessed-Upper Body Bathing: Sitting at sink Lower Body Bathing: Moderate assistance Where Assessed-Lower Body Bathing: Standing at sink;Sitting at sink Upper Body Dressing: Minimal assistance Where Assessed-Upper Body Dressing: Sitting at sink Lower Body Dressing: Moderate assistance Where Assessed-Lower Body Dressing: Sitting at sink;Standing at sink Toileting: Minimal assistance Where Assessed-Toileting: Glass blower/designer: Psychiatric nurse Method: Counselling psychologist: Energy manager: Not assessed Tub/Shower Equipment: Civil engineer, contracting with back Social research officer, government: Environmental education officer Method: Radiographer, therapeutic: Shower seat with back ADL Comments: Pt relatively min a overall with UB bathing, dressing, grooming sink side, LB bathing and dressing with mod A. Min cues for sequencing. Used RW for amb to bathroom however PT reports pt can trial with gait belt and support of OT. Used grab bar for support and min a for toileting. Mobility  Bed Mobility Bed Mobility: Supine to Sit;Sit to Supine Supine to Sit: Supervision/Verbal cueing Sit to Supine: Supervision/Verbal cueing Transfers Sit to Stand: Contact  Guard/Touching assist  Pt seen for full initial OT evaluation and training session this am. Pt in bed upon OT arrival with husband present at bedside. OT introduced role of therapy and purpose of session. Pt  open to all presented assessment and training this visit. OT assisted and assessed ADL's, mobility, vision, sensation. cognition/lang, G/FMC, strength and balance throughout session. See above for levels however pt relatively min a overall with UB bathing, dressing, grooming sink side, LB bathing and dressing with mod A. Min cues for sequencing. Used RW for amb to bathroom however PT reports pt can trial with gait belt and support of OT. Used grab bar for support and min a for toileting. Pt will benefit from skilled OT services at CIR to maximize function and safety with recommendation to return home with mod I for BADL's and S for higher level  activity with HHOT services upon d/c home. Pt left at end of session in w/c with chair alarm set, tray table and nurse call bell within reach..  Discharge Criteria: Patient will be discharged from OT if patient refuses treatment 3 consecutive times without medical reason, if treatment goals not met, if there is a change in medical status, if patient makes no progress towards goals or if patient is discharged from hospital.  The above assessment, treatment plan, treatment alternatives and goals were discussed and mutually agreed upon: by patient   Barnabas Lister 03/24/2022, 4:14 PM

## 2022-03-25 DIAGNOSIS — G928 Other toxic encephalopathy: Secondary | ICD-10-CM | POA: Diagnosis not present

## 2022-03-25 LAB — BASIC METABOLIC PANEL
Anion gap: 11 (ref 5–15)
BUN: 22 mg/dL (ref 8–23)
CO2: 27 mmol/L (ref 22–32)
Calcium: 8.6 mg/dL — ABNORMAL LOW (ref 8.9–10.3)
Chloride: 98 mmol/L (ref 98–111)
Creatinine, Ser: 1.08 mg/dL — ABNORMAL HIGH (ref 0.44–1.00)
GFR, Estimated: 51 mL/min — ABNORMAL LOW (ref >60)
Glucose, Bld: 180 mg/dL — ABNORMAL HIGH (ref 70–99)
Potassium: 3.2 mmol/L — ABNORMAL LOW (ref 3.5–5.1)
Sodium: 136 mmol/L (ref 135–145)

## 2022-03-25 LAB — MAGNESIUM: Magnesium: 1.9 mg/dL (ref 1.7–2.4)

## 2022-03-25 MED ORDER — POTASSIUM CHLORIDE CRYS ER 20 MEQ PO TBCR
30.0000 meq | EXTENDED_RELEASE_TABLET | Freq: Two times a day (BID) | ORAL | Status: DC
  Administered 2022-03-25 – 2022-03-31 (×12): 30 meq via ORAL
  Filled 2022-03-25 (×12): qty 1

## 2022-03-25 MED ORDER — CHLORHEXIDINE GLUCONATE CLOTH 2 % EX PADS
6.0000 | MEDICATED_PAD | Freq: Two times a day (BID) | CUTANEOUS | Status: DC
  Administered 2022-03-25 – 2022-03-30 (×9): 6 via TOPICAL

## 2022-03-25 NOTE — Progress Notes (Signed)
PROGRESS NOTE   Subjective/Complaints:  No issues overnite, pt visiting with husband , labs reviewed Mg ++ normalized at 1.9  ROS:  Pt denies SOB, abd pain, CP, N/V/C/D, and vision changes   Objective:   No results found. Recent Labs    03/23/22 0245 03/24/22 0548  WBC 6.2 5.3  HGB 8.1* 8.0*  HCT 24.5* 24.9*  PLT 317 301    Recent Labs    03/23/22 0245 03/24/22 0548  NA 134* 137  K 2.9* 3.4*  CL 98 102  CO2 27 25  GLUCOSE 148* 100*  BUN 24* 20  CREATININE 1.10* 0.99  CALCIUM 8.0* 8.1*     Intake/Output Summary (Last 24 hours) at 03/25/2022 6295 Last data filed at 03/25/2022 0751 Gross per 24 hour  Intake 120 ml  Output --  Net 120 ml         Physical Exam: Vital Signs Blood pressure (!) 160/65, pulse 75, temperature 98.8 F (37.1 C), temperature source Oral, resp. rate 16, height '5\' 4"'$  (1.626 m), weight 59.8 kg, SpO2 96 %.    General: No acute distress Mood and affect are appropriate Heart: Regular rate and rhythm no rubs murmurs or extra sounds Lungs: Clear to auscultation, breathing unlabored, no rales or wheezes Abdomen: Positive bowel sounds, soft nontender to palpation, nondistended Extremities: No clubbing, cyanosis, or edema Skin: No evidence of breakdown, no evidence of rash Neurologic: Cranial nerves II through XII intact, motor strength is 5/5 in bilateral deltoid, bicep, tricep, grip, hip flexor, knee extensors, ankle dorsiflexor and plantar flexor Sensory exam normal sensation to light touch and proprioception in bilateral upper and lower extremities Cerebellar exam normal finger to nose to finger as well as heel to shin in bilateral upper and lower extremities Musculoskeletal: Full range of motion in all 4 extremities. No joint swelling     Assessment/Plan: 1. Functional deficits which require 3+ hours per day of interdisciplinary therapy in a comprehensive inpatient rehab  setting. Physiatrist is providing close team supervision and 24 hour management of active medical problems listed below. Physiatrist and rehab team continue to assess barriers to discharge/monitor patient progress toward functional and medical goals  Care Tool:  Bathing    Body parts bathed by patient: Right arm, Left arm, Chest, Abdomen, Front perineal area, Left upper leg, Right upper leg, Face   Body parts bathed by helper: Buttocks, Right lower leg, Left lower leg     Bathing assist Assist Level: Moderate Assistance - Patient 50 - 74%     Upper Body Dressing/Undressing Upper body dressing   What is the patient wearing?: Dress    Upper body assist Assist Level: Minimal Assistance - Patient > 75%    Lower Body Dressing/Undressing Lower body dressing      What is the patient wearing?: Incontinence brief     Lower body assist Assist for lower body dressing: Moderate Assistance - Patient 50 - 74%     Toileting Toileting    Toileting assist Assist for toileting: Moderate Assistance - Patient 50 - 74%     Transfers Chair/bed transfer  Transfers assist     Chair/bed transfer assist level: Contact Guard/Touching assist  Locomotion Ambulation   Ambulation assist      Assist level: Contact Guard/Touching assist Assistive device: No Device Max distance: 210 ft   Walk 10 feet activity   Assist     Assist level: Contact Guard/Touching assist Assistive device: No Device   Walk 50 feet activity   Assist    Assist level: Contact Guard/Touching assist Assistive device: No Device    Walk 150 feet activity   Assist    Assist level: Contact Guard/Touching assist Assistive device: No Device    Walk 10 feet on uneven surface  activity   Assist     Assist level: Contact Guard/Touching assist Assistive device: Walker-rolling   Wheelchair     Assist Is the patient using a wheelchair?: Yes Type of Wheelchair: Manual    Wheelchair  assist level: Dependent - Patient 0% (transport only this session)      Wheelchair 50 feet with 2 turns activity    Assist            Wheelchair 150 feet activity     Assist          Blood pressure (!) 160/65, pulse 75, temperature 98.8 F (37.1 C), temperature source Oral, resp. rate 16, height '5\' 4"'$  (1.626 m), weight 59.8 kg, SpO2 96 %.  Medical Problem List and Plan: 1. 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  EEG negative.  Continue Keppra 750 mg twice daily and Dilantin 300 mg nightly             -patient may shower             -ELOS/Goals: 10-14 days, Mod I PT/OT, I SLP goals  Con't CIR- first day of evaluations- PT, OT and SLP- pt doesn't want to stay longer than ~ 10 days, which if possible, I agree with, so she can start Cancer treatment.  2.  Antithrombotics: -DVT/anticoagulation:  Pharmaceutical: Lovenox             -antiplatelet therapy: N/A   3. Pain Management: Oxycodone as needed  10/27- denies pain- con't meds prn 4. Mood/Behavior/Sleep: Provide emotional support             -antipsychotic agents: N/A 5. Neuropsych/cognition: This patient is not capable of making decisions on her own behalf. 6. Skin/Wound Care: Routine skin checks 7. Fluids/Electrolytes/Nutrition (hypokalemia, hypomagnasemia): Routine in and outs with follow-up chemistries           - Receiving intermittent IV K and Mag repletion; check admission labs and then would start on standing supplementation    10/27- pt's Mg is 1.2 and K_ 3.4- will replete with 4 G IV Mg and 20 mEq x2 of K+ and recheck in AM 10/28 K+ still low increase KCL to 54mq BID recheck K+ in am  8.  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  Status post ERCP with stenting 03/05/2022 per Dr. HBenson Norway  LFTs improving with latest total bilirubin 0.7.              - Follow-up outpatient oncology services [facility] regarding  chemotherapy   10/27- pt would like to get out of rehab "as soon as possible"- doesn't want to be here more than ~ 10 days 9.  AKI.   Follow-up chemistries      Latest Ref Rng & Units 03/25/2022    9:13 AM 03/24/2022    5:48 AM 03/23/2022    2:45 AM  BMP  Glucose 70 -  99 mg/dL 180  100  148   BUN 8 - 23 mg/dL '22  20  24   '$ Creatinine 0.44 - 1.00 mg/dL 1.08  0.99  1.10   Sodium 135 - 145 mmol/L 136  137  134   Potassium 3.5 - 5.1 mmol/L 3.2  3.4  2.9   Chloride 98 - 111 mmol/L 98  102  98   CO2 22 - 32 mmol/L '27  25  27   '$ Calcium 8.9 - 10.3 mg/dL 8.6  8.1  8.0     10.  Constipation.  MiraLAX daily as well as Senokot-S tablet twice daily  10/27- LBM last night-     LOS: 2 days A FACE TO FACE EVALUATION WAS PERFORMED  Charlett Blake 03/25/2022, 9:38 AM

## 2022-03-25 NOTE — Progress Notes (Signed)
Speech Language Pathology Daily Session Note  Patient Details  Name: Shirley Nichols MRN: [id number] Date of Birth: 1940-10-06  Today's Date: 03/25/2022 SLP Individual Time: 2162-4469 SLP Individual Time Calculation (min): 43 min  Short Term Goals: Week 1: SLP Short Term Goal 1 (Week 1): STG=LTG due to ELOS  Skilled Therapeutic Interventions:Skilled ST services focused on cognitive skills. SLP facilitated recall, problem solving and error awareness in ALFA money management task. Pt required supervision A verbal cues for working memory. Pt requested to use the bathroom, SLP provided supervision A with RW, pt had a BM and then required supervision A verbal cues for recall when sequencing steps during the transfers. Pt was left in room with call bell within reach and bed alarm set. SLP recommends to continue skilled services.       Pain Pain Assessment Faces Pain Scale: No hurt  Therapy/Group: Individual Therapy  Akil Hoos  [facility] 03/25/2022, 12:23 PM

## 2022-03-26 LAB — POTASSIUM: Potassium: 3.7 mmol/L (ref 3.5–5.1)

## 2022-03-26 MED ORDER — SODIUM CHLORIDE 0.9% FLUSH
10.0000 mL | Freq: Two times a day (BID) | INTRAVENOUS | Status: DC
  Administered 2022-03-27 – 2022-03-28 (×3): 10 mL

## 2022-03-26 MED ORDER — SODIUM CHLORIDE 0.9% FLUSH: 10.0000 mL | INTRAVENOUS | Status: DC | PRN

## 2022-03-26 NOTE — Progress Notes (Signed)
Physical Therapy Session Note  Patient Details  Name: Shirley Nichols MRN: [id number] Date of Birth: 21-Nov-1940  Today's Date: 03/26/2022 PT Individual Time: 1101-1156 PT Individual Time Calculation (min): 55 min   Short Term Goals: Week 1:  PT Short Term Goal 1 (Week 1): =LTGs d/t ELOS  Skilled Therapeutic Interventions/Progress Updates:      Therapy Documentation Precautions:  Precautions Precautions: Fall Restrictions Weight Bearing Restrictions: No  Treatment Session 1:  Pt received semi-reclined in bed agreeable to PT session with emphasis on gait and balance training. Pt declines pain. Pt (S) for safety with bed mobility and CGA with sit to stand with no AD. Pt ambulated ~300 ft no AD CGA to dayroom and requires verbal cues to increase gait speed. Pt provided with extended rest break and pt continued gait training with unilateral HHA from therapist to cue pt on pacing and pt able to ambulated 400 ft at increased speed. Pt ambulated to main gym and performed TUG and required 11.69 seconds to complete. Pt score for 10 meter walk test was 2.44 ft/s indicating pt demonstrates decreased functional efficiency. Pt transitioned to dynamic balance training with gait and performed following activities with CGA:   Gait with high knees   Heel-to-toe gait   Gait with horizontal and vertical head turns   Lateral stepping  Pt unable to perform lateral grapevine with gait due to decreased coordination and difficulty following multi-step commands. Pt ambulated to room and left semi-reclined in bed with all needs in reach and bed alarm on.   10 Meter Walk Test: Patient instructed to walk 10 meters (32.8 ft) as quickly and as safely as possible at their normal speed x2 and at a fast speed x2. Time measured from 2 meter mark to 8 meter mark to accommodate ramp-up and ramp-down.   Cut off scores: <0.4 m/s = household Ambulator, 0.4-0.8 m/s = limited community Ambulator, >0.8 m/s = community  Ambulator, >1.2 m/s = crossing a street, <1.0 = increased fall risk MCID 0.05 m/s (small), 0.13 m/s (moderate), 0.06 m/s (significant)  (ANPTA Core Set of Outcome Measures for Adults with Neurologic Conditions, 2018)  Treatment Session 2:  Pt received semi-reclined in bed, declines pain, and agreeable to PT session. Pt (S) with bed mobility, sit to stand and ambulation ~150 ft to main gym. Pt performed Berg balance assessment and scored 51/56 indicating moderate falls risk.  Pt performed FGA and scored 13/30 indicating falls risk, see results below for additional details. Pt requests need to return to room for toileting and with +BM. Pt requires (S) for static and dynamic standing balance while performing peri-care. PT encouraged pt to participate remaining 11 minutes of PT session and pt declined due to fatigue. Pt left semi-reclined in bed with all needs in reach and bed alarm on.   Balance: Standardized Balance Assessment Standardized Balance Assessment: Berg Balance Test;Functional Gait Assessment Berg Balance Test Sit to Stand: Able to stand without using hands and stabilize independently Standing Unsupported: Able to stand safely 2 minutes Sitting with Back Unsupported but Feet Supported on Floor or Stool: Able to sit safely and securely 2 minutes Stand to Sit: Sits safely with minimal use of hands Transfers: Able to transfer safely, minor use of hands Standing Unsupported with Eyes Closed: Able to stand 10 seconds safely Standing Ubsupported with Feet Together: Able to place feet together independently and stand 1 minute safely From Standing, Reach Forward with Outstretched Arm: Can reach confidently >25 cm (10") From Standing Position,  Pick up Object from Floor: Able to pick up shoe safely and easily From Standing Position, Turn to Look Behind Over each Shoulder: Looks behind from both sides and weight shifts well Turn 360 Degrees: Able to turn 360 degrees safely in 4 seconds or  less Standing Unsupported, Alternately Place Feet on Step/Stool: Able to stand independently and safely and complete 8 steps in 20 seconds Standing Unsupported, One Foot in Front: Loses balance while stepping or standing Standing on One Leg: Able to lift leg independently and hold 5-10 seconds Total Score: 51  Patient demonstrates increased fall risk as noted by score of  51 /56 on Berg Balance Scale.  (<36= high risk for falls, close to 100%; 37-45 significant >80%; 46-51 moderate >50%; 52-55 lower >25%)   Functional Gait  Assessment Gait assessed : Yes Gait Level Surface: Walks 20 ft, slow speed, abnormal gait pattern, evidence for imbalance or deviates 10-15 in outside of the 12 in walkway width. Requires more than 7 sec to ambulate 20 ft. Change in Gait Speed: Able to change speed, demonstrates mild gait deviations, deviates 6-10 in outside of the 12 in walkway width, or no gait deviations, unable to achieve a major change in velocity, or uses a change in velocity, or uses an assistive device. Gait with Horizontal Head Turns: Performs head turns with moderate changes in gait velocity, slows down, deviates 10-15 in outside 12 in walkway width but recovers, can continue to walk. Gait with Vertical Head Turns: Performs task with moderate change in gait velocity, slows down, deviates 10-15 in outside 12 in walkway width but recovers, can continue to walk. Gait and Pivot Turn: Pivot turns safely in greater than 3 sec and stops with no loss of balance, or pivot turns safely within 3 sec and stops with mild imbalance, requires small steps to catch balance. Step Over Obstacle: Is able to step over one shoe box (4.5 in total height) but must slow down and adjust steps to clear box safely. May require verbal cueing. Gait with Narrow Base of Support: Ambulates 7-9 steps. Gait with Eyes Closed: Walks 20 ft, slow speed, abnormal gait pattern, evidence for imbalance, deviates 10-15 in outside 12 in walkway  width. Requires more than 9 sec to ambulate 20 ft. Ambulating Backwards: Walks 20 ft, slow speed, abnormal gait pattern, evidence for imbalance, deviates 10-15 in outside 12 in walkway width. Steps: Two feet to a stair, must use rail. Total Score: 13  Patient demonstrates increased fall risk as noted by score of 13/30 on  Functional Gait Assessment.   <22/30 = predictive of falls, <20/30 = fall in 6 months, <18/30 = predictive of falls in PD MCID: 5 points stroke population, 4 points geriatric population (ANPTA Core Set of Outcome Measures for Adults with Neurologic Conditions, 2018)   Therapy/Group: Individual Therapy  [name] [name] PT, DPT  03/26/2022, 7:54 AM

## 2022-03-26 NOTE — IPOC Note (Signed)
Overall Plan of Care [facility]) Patient Details Name: Shirley Nichols MRN: [id number] DOB: [birth year]  Admitting Diagnosis: Toxic metabolic encephalopathy  Hospital Problems: Principal Problem:   Toxic metabolic encephalopathy     Functional Problem List: Nursing Behavior, Nutrition, Bladder, Bowel, Pain, Edema, Safety, Endurance, Sensory, Medication Management, Skin Integrity, Motor  PT Balance, Safety, Edema, Endurance  OT Balance, Cognition, Endurance, Motor, Safety  SLP Cognition  TR         Basic ADL's: OT Grooming, Bathing, Dressing, Toileting     Advanced  ADL's: OT Simple Meal Preparation, Laundry, Light Housekeeping     Transfers: PT Bed Mobility, Bed to Chair, Car, Manufacturing systems engineer, Metallurgist: PT Ambulation, Stairs     Additional Impairments: OT None  SLP Social Cognition   Problem Solving, Memory, Attention, Awareness  TR      Anticipated Outcomes Item Anticipated Outcome  Self Feeding indep  Swallowing      Basic self-care  mod Indep  Toileting  mod I   Bathroom Transfers S  Bowel/Bladder  continent x 2  Transfers  mod I bed chair transfer  Locomotion  supervision gait  Communication     Cognition  sup-to-min A  Pain  less than 3  Safety/Judgment  remain fall free while in rehab   Therapy Plan: PT Intensity: Minimum of 1-2 x/day ,45 to 90 minutes PT Frequency: 5 out of 7 days PT Duration Estimated Length of Stay: 7-10 days OT Intensity: Minimum of 1-2 x/day, 45 to 90 minutes OT Frequency: 5 out of 7 days OT Duration/Estimated Length of Stay: 7-10 days SLP Intensity: Minumum of 1-2 x/day, 30 to 90 minutes SLP Frequency: 3 to 5 out of 7 days SLP Duration/Estimated Length of Stay: 7-10 days   Team Interventions: Nursing Interventions Patient/Family Education, Disease Management/Prevention, Skin Care/Wound Management, Discharge Planning, Bladder Management, Pain Management, Cognitive Remediation/Compensation,  Psychosocial Support, Bowel Management, Medication Management  PT interventions Ambulation/gait training, Cognitive remediation/compensation, Discharge planning, DME/adaptive equipment instruction, Functional mobility training, Pain management, Psychosocial support, Splinting/orthotics, Therapeutic Activities, UE/LE Strength taining/ROM, Visual/perceptual remediation/compensation, Wheelchair propulsion/positioning, UE/LE Coordination activities, Therapeutic Exercise, Stair training, Skin care/wound management, Patient/family education, Neuromuscular re-education, Functional electrical stimulation, Disease management/prevention, Academic librarian, Training and development officer  OT Interventions Training and development officer, Disease mangement/prevention, Neuromuscular re-education, Self Care/advanced ADL retraining, Therapeutic Exercise, Wheelchair propulsion/positioning, Cognitive remediation/compensation, DME/adaptive equipment instruction, UE/LE Strength taining/ROM, Academic librarian, Barrister's clerk education, UE/LE Coordination activities, Discharge planning, Functional mobility training, Psychosocial support, Therapeutic Activities  SLP Interventions Cognitive remediation/compensation, Patient/family education, Therapeutic Activities, Functional tasks  TR Interventions    SW/CM Interventions Discharge Planning, Psychosocial Support, Patient/Family Education   Barriers to Discharge MD  Medical stability and Pending chemo/radiation  Nursing Decreased caregiver support, Home environment Child psychotherapist, Insurance for SNF coverage, Weight, Pending chemo/radiation, Behavior 1 level with a few ste  PT Home environment access/layout    OT Home environment access/layout, Other (comments) steps to enter, new changes in overall activity tolerance and higher level cognition  SLP      SW Insurance for SNF coverage     Team Discharge Planning: Destination: PT-Home ,OT- Home , SLP-Home Projected  Follow-up: PT-Outpatient PT, OT-  Home health OT, SLP-24 hour supervision/assistance, Home Health SLP Projected Equipment Needs: PT-To be determined, OT-  , SLP-None recommended by SLP Equipment Details: PT- , OT-has a RW, shower seat and 3 in 1 commode that was from husbands knee replacmeent last year Patient/family involved in discharge planning: PT- Patient, Family member/caregiver,  OT-Patient, Family  member/caregiver, SLP-Patient, Family member/caregiver  MD ELOS: 7-10d Medical Rehab Prognosis:  Fair Assessment: The patient has been admitted for CIR therapies with the diagnosis of Encephalopathy. The team will be addressing functional mobility, strength, stamina, balance, safety, adaptive techniques and equipment, self-care, bowel and bladder mgt, patient and caregiver education, monitor for biliary obstruction. Goals have been set at Mod I. Anticipated discharge destination is Home with husband.        See Team Conference Notes for weekly updates to the plan of care

## 2022-03-26 NOTE — Progress Notes (Signed)
Occupational Therapy Session Note  Patient Details  Name: Shirley Nichols MRN: [id number] Date of Birth: 1941-02-02  Today's Date: 03/26/2022 OT Individual Time: [phone] OT Individual Time Calculation (min): 75 min    Short Term Goals: Week 1:  OT Short Term Goal 1 (Week 1): STG's=LTG's d/t LOS  Skilled Therapeutic Interventions/Progress Updates:    Pt seen for skilled OT session focussed on functional mobility and self care retraining including full shower this am. Pt with no pain and no reports of any issues. Pt was able to move to EOB and sit to and from stand with S and min cues. Completed routine as follows: close S and set up for full am self care incl shower with min cues for safety and sequencing, rests between activity d/t overall decreased activity tolerance. Improved overall processing and situational awareness throughout session. Stood for oral care for 4 minutes then prolonged seated rest. Energy conservation principles initiated and functional reach up to 4-5" unilaterally in standing.  OT provided hair drying post hair washing otherwise pt was an active participant in all aspects of morning routine. Husband was not present this session but OT anticipates following Family Educ later week pt will be on target for d/c to home with Belen.    Therapy Documentation Precautions:  Precautions Precautions: Fall Restrictions Weight Bearing Restrictions: No    Therapy/Group: Individual Therapy  Barnabas Lister 03/26/2022, 8:18 AM

## 2022-03-27 DIAGNOSIS — G928 Other toxic encephalopathy: Secondary | ICD-10-CM | POA: Diagnosis not present

## 2022-03-27 NOTE — Patient Instructions (Signed)
1) Strengthening: Chest Pull - Resisted   Hold Theraband in front of body with hands about shoulder width a part. Pull band a part and back together slowly. Repeat __10-12__ times. Complete ___1_ set(s) per session.. Repeat __1__ session(s) per day.  http://orth.exer.us/926   Copyright  VHI. All rights reserved.   2) PNF Strengthening: Resisted   Standing with resistive band around each hand, bring right arm up and away, thumb back. Repeat _10-12___ times per set. Do __1__ sets per session. Do ___1_ sessions per day.        3) Resisted External Rotation: in Neutral - Bilateral   Sit or stand, tubing in both hands, elbows at sides, bent to 90, forearms forward. Pinch shoulder blades together and rotate forearms out. Keep elbows at sides. Repeat ___10-12_ times per set. Do __1__ sets per session. Do _1___ sessions per day.  http://orth.exer.us/966   Copyright  VHI. All rights reserved.   4) PNF Strengthening: Resisted   Standing, hold resistive band above head. Bring right arm down and out from side. Repeat __10-12__ times per set. Do __1__ sets per session. Do _1___ sessions per day.  http://orth.exer.us/922   Copyright  VHI. All rights reserved.

## 2022-03-27 NOTE — Progress Notes (Signed)
PROGRESS NOTE   Subjective/Complaints:  LBM per night- no issues per pt- having a little pain in LLE, but doesn't need even tylenol.   ROS:   Pt denies SOB, abd pain, CP, N/V/C/D, and vision changes    Objective:   No results found. No results for input(s): "WBC", "HGB", "HCT", "PLT" in the last 72 hours. Recent Labs    03/25/22 0913 03/26/22 0653  NA 136  --   K 3.2* 3.7  CL 98  --   CO2 27  --   GLUCOSE 180*  --   BUN 22  --   CREATININE 1.08*  --   CALCIUM 8.6*  --     Intake/Output Summary (Last 24 hours) at 03/27/2022 1926 Last data filed at 03/27/2022 1833 Gross per 24 hour  Intake 694 ml  Output --  Net 694 ml        Physical Exam: Vital Signs Blood pressure (!) 145/64, pulse 78, temperature 98.5 F (36.9 C), temperature source Oral, resp. rate 17, height '5\' 4"'$  (1.626 m), weight 59.8 kg, SpO2 97 %.     General: awake, alert, appropriate, sitting up in bed getting shirt on with PT;  NAD HENT: conjugate gaze; oropharynx moist CV: regular rate; no JVD Pulmonary: CTA B/L; no W/R/R- good air movement GI: soft, NT, ND, (+)BS Psychiatric: appropriate- delayed responses Neurological: alert Extremities: No clubbing, cyanosis, or edema Skin: No evidence of breakdown, no evidence of rash Neurologic: Cranial nerves II through XII intact, motor strength is 5/5 in bilateral deltoid, bicep, tricep, grip, hip flexor, knee extensors, ankle dorsiflexor and plantar flexor Sensory exam normal sensation to light touch and proprioception in bilateral upper and lower extremities Cerebellar exam normal finger to nose to finger as well as heel to shin in bilateral upper and lower extremities Musculoskeletal: Full range of motion in all 4 extremities. No joint swelling     Assessment/Plan: 1. Functional deficits which require 3+ hours per day of interdisciplinary therapy in a comprehensive inpatient rehab  setting. Physiatrist is providing close team supervision and 24 hour management of active medical problems listed below. Physiatrist and rehab team continue to assess barriers to discharge/monitor patient progress toward functional and medical goals  Care Tool:  Bathing    Body parts bathed by patient: Right arm, Left arm, Chest, Abdomen, Front perineal area, Left upper leg, Right upper leg, Face   Body parts bathed by helper: Buttocks, Right lower leg, Left lower leg     Bathing assist Assist Level: Moderate Assistance - Patient 50 - 74%     Upper Body Dressing/Undressing Upper body dressing   What is the patient wearing?: Dress    Upper body assist Assist Level: Minimal Assistance - Patient > 75%    Lower Body Dressing/Undressing Lower body dressing      What is the patient wearing?: Incontinence brief     Lower body assist Assist for lower body dressing: Moderate Assistance - Patient 50 - 74%     Toileting Toileting    Toileting assist Assist for toileting: Moderate Assistance - Patient 50 - 74%     Transfers Chair/bed transfer  Transfers assist  Chair/bed transfer assist level: Contact Guard/Touching assist     Locomotion Ambulation   Ambulation assist      Assist level: Contact Guard/Touching assist Assistive device: No Device Max distance: 210 ft   Walk 10 feet activity   Assist     Assist level: Contact Guard/Touching assist Assistive device: No Device   Walk 50 feet activity   Assist    Assist level: Contact Guard/Touching assist Assistive device: No Device    Walk 150 feet activity   Assist    Assist level: Contact Guard/Touching assist Assistive device: No Device    Walk 10 feet on uneven surface  activity   Assist     Assist level: Contact Guard/Touching assist Assistive device: Walker-rolling   Wheelchair     Assist Is the patient using a wheelchair?: Yes Type of Wheelchair: Manual    Wheelchair  assist level: Dependent - Patient 0% (transport only this session)      Wheelchair 50 feet with 2 turns activity    Assist        Assist Level: Dependent - Patient 0%   Wheelchair 150 feet activity     Assist      Assist Level: Dependent - Patient 0%   Blood pressure (!) 145/64, pulse 78, temperature 98.5 F (36.9 C), temperature source Oral, resp. rate 17, height '5\' 4"'$  (1.626 m), weight 59.8 kg, SpO2 97 %.  Medical Problem List and Plan: 1. 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  EEG negative.  Continue Keppra 750 mg twice daily and Dilantin 300 mg nightly             -patient may shower             -ELOS/Goals: 10-14 days, Mod I PT/OT, I SLP goals  Con't CIR- first day of evaluations- PT, OT and SLP- pt doesn't want to stay longer than ~ 10 days, which if possible, I agree with, so she can start Cancer treatment.   10/30- team conference tomorrow- con't CIR- 2.  Antithrombotics: -DVT/anticoagulation:  Pharmaceutical: Lovenox             -antiplatelet therapy: N/A   3. Pain Management: Oxycodone as needed  10/27- denies pain- con't meds prn 4. Mood/Behavior/Sleep: Provide emotional support             -antipsychotic agents: N/A 5. Neuropsych/cognition: This patient is not capable of making decisions on her own behalf. 6. Skin/Wound Care: Routine skin checks 7. Fluids/Electrolytes/Nutrition (hypokalemia, hypomagnasemia): Routine in and outs with follow-up chemistries           - Receiving intermittent IV K and Mag repletion; check admission labs and then would start on standing supplementation    10/27- pt's Mg is 1.2 and K_ 3.4- will replete with 4 G IV Mg and 20 mEq x2 of K+ and recheck in AM 10/28 K+ still low increase KCL to 33mq BID recheck K+ in am   10/30- last K_ 3.7 and Mg was 1.9- doing better 8.  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  Status post ERCP with stenting  03/05/2022 per Dr. HBenson [place]  LFTs improving with latest total bilirubin 0.7.              - Follow-up outpatient oncology services DBiddlefor Recs regarding chemotherapy   10/27- pt would like to get out of rehab "as soon as possible"- doesn't want to be here more than ~ 10 days 9.  AKI.  Follow-up chemistries      Latest Ref Rng & Units 03/26/2022    6:53 AM 03/25/2022    9:13 AM 03/24/2022    5:48 AM  BMP  Glucose 70 - 99 mg/dL  180  100   BUN 8 - 23 mg/dL  22  20   Creatinine 0.44 - 1.00 mg/dL  1.08  0.99   Sodium 135 - 145 mmol/L  136  137   Potassium 3.5 - 5.1 mmol/L 3.7  3.2  3.4   Chloride 98 - 111 mmol/L  98  102   CO2 22 - 32 mmol/L  27  25   Calcium 8.9 - 10.3 mg/dL  8.6  8.1    10/30- Last Cr 1.08- just a tiny bit elevated- will recheck later in week 10.  Constipation.  MiraLAX daily as well as Senokot-S tablet twice daily  10/27- LBM last night-   10/30- LBM yesterday   LOS: 4 days A FACE TO FACE EVALUATION WAS PERFORMED  Fuller Makin 03/27/2022, 7:26 PM

## 2022-03-27 NOTE — Progress Notes (Signed)
Speech Language Pathology Daily Session Note  Patient Details  Name: Shirley Nichols MRN: [id number] Date of Birth: 1941/05/28  Today's Date: 03/27/2022 SLP Individual Time: 0945-1030 SLP Individual Time Calculation (min): 45 min  Short Term Goals: Week 1: SLP Short Term Goal 1 (Week 1): STG=LTG due to ELOS  Skilled Therapeutic Interventions:Skilled ST services focused on cognitive skills. SLP facilitated mildly complex problem solving, short term/working memory, sustained attention in organization task (kitchen and closets.) Pt required max A verbal cues for problem solving, primarly impacted by attention and recall. On the second task pt continued to require max A verbal cues but there was a reduction in cues. Pt was left in room with call bell within reach and bed/chair alarm set. SLP recommends to continue skilled services.     Pain Pain Assessment Pain Score: 0-No pain  Therapy/Group: Individual Therapy  Karalyne Nusser 03/27/2022, 2:30 PM

## 2022-03-27 NOTE — Progress Notes (Signed)
Occupational Therapy Session Note  Patient Details  Name: Shirley Nichols MRN: [id number] Date of Birth: 1941-04-09  Today's Date: 03/27/2022 OT Individual Time: 1050-1200 OT Individual Time Calculation (min): 70 min    Short Term Goals: Week 1:  OT Short Term Goal 1 (Week 1): STG's=LTG's d/t LOS  Skilled Therapeutic Interventions/Progress Updates:    Patient agreeable to participate in OT session. Reports 0/10 pain level.   Patient participated in skilled OT session focusing on functional transfers, ADL transfers, HEP, and cognitive skills training. Therapist educated patient on BUE strengthening HEP in order to improve UE strength and endurance needed when completing self care and functional transfers Pt completed functional transfer from bed to w/c with no device requiring CGA; std pvt. HEP established; see below for exercises. Pt provided with VC and visual demonstration along with handout. Pt returned demonstration. - Cognitive skills training activity completed using Q-bitz puzzle. One puzzle card completed with Max difficulty and increased time. Pt required Max VC to complete with OT providing verbal problem solving cues and visual aid such as using paper to block off portion of puzzle being completed to decrease visual overstimulation and increase ability to focus.  - Able to don and doff non-slip socks with SBA during session seated; 2 trials. Replaced TEDs with appropriate size. - Toileting, toilet transfer, and hand washing completed at SBA at end of session. Pt completed functional mobility from w/c to toilet with SBA and no AD used. Regular height toilet used with no need for additional AD over toilet.   Strengthening completed:  - seated, shoulder horizontal abduction/adduction, flexion, IR/er, abduction, 10X, 1 set, red band    Therapy Documentation Precautions:  Precautions Precautions: Fall Restrictions Weight Bearing Restrictions: No  Therapy/Group: Individual  Therapy  Ailene Ravel, OTR/L,CBIS  Supplemental OT - [facility]  03/27/2022, 7:53 AM

## 2022-03-27 NOTE — Progress Notes (Signed)
Physical Therapy Session Note  Patient Details  Name: Shirley Nichols MRN: [id number] Date of Birth: [birth year]  Today's Date: 03/27/2022 PT Individual Time: 0803-0914 PT Individual Time Calculation (min): 71 min   Short Term Goals: Week 1:  PT Short Term Goal 1 (Week 1): =LTGs d/t ELOS  Skilled Therapeutic Interventions/Progress Updates:      Therapy Documentation Precautions:  Precautions Precautions: Fall Restrictions Weight Bearing Restrictions: No  Pt received semi-reclined in bed agreeable to PT session with emphasis on gait and balance training. At start of session, pt reports to MD of left hip pain, declines medical intervention. During mobility pt reported upper left calf pain. Observed left calf is warm to touch and notified nursing. PA cleared pt to continue to participate in therapy. Pt requires supervision for safety for standing dynamic reactive balance activity. Pt utilizes B UE to toss ball to rebounder and catch ball ~ 80% of the time. Pt able to perform squat and step outside of base of support in order to catch ball. Pt with decreased activity tolerance and reports increased fatigue throughout session. Initially pt ambulated without AD with supervision for safety with decreased gait speed and left antalgic gait pattern of ~200 ft. Pt trial rollator and ambulated ~180 ft with supervision for safety. Plan to trial rollator for next few sessions to evaluate if appropriate AD for patient as she has decreased activity tolerance. Pt ambulated with rollator to room and left semi-reclined in bed with all needs in reach and bed alarm on.    Therapy/Group: Individual Therapy  [name] [name] PT, DPT  03/27/2022, 7:50 AM

## 2022-03-27 NOTE — Progress Notes (Signed)
Occupational Therapy Session Note  Patient Details  Name: Shirley Nichols MRN: [id number] Date of Birth: 1941-02-07  Today's Date: 03/27/2022 OT Individual Time: 1345-1415 OT Individual Time Calculation (min): 30 min    Short Term Goals: Week 1:  OT Short Term Goal 1 (Week 1): STG's=LTG's d/t LOS  Skilled Therapeutic Interventions/Progress Updates:     Pt received in room sleeping in her bed wit her husband present easy to wake and motivated to participate in therapy session. Pt supine>sitting EOB supervision. Pt urgently reported need to use the bathroom and ambulated to bathroom with CGA. CGA toilet transfer to standard toilet. Pt completed 3/3 toileting tasks with CGA. Pt ambulated to sink to wash face and brush her teeth with close supervision. Pt requested to stay in room during for remainder of session. Pt completed UE HEP exercises to increase carry over upon d/c home. Pt complete 1x10 reps of chest pulls, PNF cross body pulls, resisted external rotation, and single arm PNF single arm pulls. Pt demonstrated good teach back of exercises with husband also providing feedback and dmonstrating understanding. Killed verbal cues were given for connecting breath to movement, body alignment, and posture. Pt was left resting in her bed with bed alarm on, call bell in reach, and all needs met.   Therapy Documentation Precautions:  Precautions Precautions: Fall Restrictions Weight Bearing Restrictions: No General:   Vital Signs: Therapy Vitals Temp: 98.5 F (36.9 C) Temp Source: Oral Pulse Rate: 78 Resp: 17 BP: (Abnormal) 145/64 Patient Position (if appropriate): Lying Oxygen Therapy SpO2: 97 % O2 Device: Room Air Pain: Pain Assessment Pain Score: 0-No pain ADL: ADL Eating: Set up Where Assessed-Eating: Wheelchair Grooming: Minimal assistance Where Assessed-Grooming: Sitting at sink Upper Body Bathing: Minimal assistance Where Assessed-Upper Body Bathing: Sitting at  sink Lower Body Bathing: Moderate assistance Where Assessed-Lower Body Bathing: Standing at sink, Sitting at sink Upper Body Dressing: Minimal assistance Where Assessed-Upper Body Dressing: Sitting at sink Lower Body Dressing: Moderate assistance Where Assessed-Lower Body Dressing: Sitting at sink, Standing at sink Toileting: Minimal assistance Where Assessed-Toileting: Glass blower/designer: Psychiatric nurse Method: Counselling psychologist: Energy manager: Not assessed Tub/Shower Equipment: Civil engineer, contracting with back Social research officer, government: Environmental education officer Method: Radiographer, therapeutic: Shower seat with back ADL Comments: Pt relatively min a overall with UB bathing, dressing, grooming sink side, LB bathing and dressing with mod A. Min cues for sequencing. Used RW for amb to bathroom however PT reports pt can trial with gait belt and support of OT. Used grab bar for support and min a for toileting.   Therapy/Group: Individual Therapy  Janey Genta 03/27/2022, 4:07 PM

## 2022-03-28 DIAGNOSIS — G928 Other toxic encephalopathy: Secondary | ICD-10-CM | POA: Diagnosis not present

## 2022-03-28 NOTE — Patient Care Conference (Signed)
Inpatient RehabilitationTeam Conference and Plan of Care Update Date: 03/28/2022   Time: 11:38 AM   Patient Name: Shirley Nichols      Medical Record Number: [id number]  Date of Birth: Jul 03, 1940 Sex: Female         Room/Bed: 4M06C/4M06C-01 Payor Info: Payor: [employer] / Plan: [employer] Must PPO / Product Type: *No Product type* /    Admit Date/Time:  03/23/2022  3:15 PM  Primary Diagnosis:  Toxic metabolic encephalopathy  Hospital Problems: Principal Problem:   Toxic metabolic encephalopathy    Expected Discharge Date: Expected Discharge Date: 03/31/22  Team Members Present: Physician leading conference: Dr. Courtney Heys Social Worker Present: Ovidio Kin, LCSW Nurse Present: Other (comment) Tacy Learn, RN) PT Present: Other (comment) [name], PT) OT Present: Jamey Ripa, OT SLP Present: Helaine Chess, SLP PPS Coordinator present : Gunnar Fusi, SLP     Current Status/Progress Goal Weekly Team Focus  Bowel/Bladder   Patient is continent of b/b  Remain continent  Assist with toileting qshift and prn   Swallow/Nutrition/ Hydration             ADL's   close S and set up for full am self care incl shower with min cues for safety and sequencing, rests between activity d/t overall decreased activity tolerance  mod I for BADL's, S for mobility and IADL's  progress activity tolerance, safety/cog, balance for BADL's and IADL's, Family Educ and d/c planning wiht UE HEP   Mobility   CGA/close (S) bed mobility, transfers, gait 400 ft no AD vs 200 ft rollator, steps x 4 no rails  mod I/ supervision  gait, high level balance, AD selection, stairs   Communication             Safety/Cognition/ Behavioral Observations  max A mildly complex problem solving due to recall deficits, mod-min A recall/sustained attention (fatigued)  min-supervision A  error awareness, basic-mildly complex problem solving, recall and sustained attention   Pain   no c/o  pain  remain pain free  Assess pain qshift and prn   Skin   Skin intact  Remain free from skin breakdown  Assess skin qshift     Discharge Planning:  HOme with husband and daughter who is here from [place] to assist Dad. Pt doing well and making good progress. Awaiting team's recommendations for DME and follow up   Team Discussion: Toxic metabolic encephalopathy. Continent B/B. Denies pain. Skin is CDI. PICC to be removed. Needs CA therapies. Patient would like to discharge soon as is getting depressed. Family participates in care and has good support at home.  Patient on target to meet rehab goals: yes, 400 ft with no device  *See Care Plan and progress notes for long and short-term goals.   Revisions to Treatment Plan:  Remove PICC line, monitor labs, medication adjustments  Teaching Needs: Medications, safety, gain/transfer training, etc.  Current Barriers to Discharge: Decreased caregiver support, Medical stability, and Pending chemo/radiation  Possible Resolutions to Barriers: Family education, nursing education, order recommended DME     Medical Summary Current Status: continent B/B- no issues with pain and skin  Barriers to Discharge: Home enviroment access/layout;Pending chemo/radiation;Medical stability;Other (comments)  Barriers to Discharge Comments: getting depressed- walking 400 ft HHA/Rollator for community for fatigue- Possible Resolutions to [employer] Focus: already has seat for shower- husband done most of family training- needs to get home with cancer treatment- husband to do higher level med/money mgmt- d/c 11/3   Continued Need for Acute  Rehabilitation Level of Care: The patient requires daily medical management by a physician with specialized training in physical medicine and rehabilitation for the following reasons: Direction of a multidisciplinary physical rehabilitation program to maximize functional independence : Yes Medical management of patient  stability for increased activity during participation in an intensive rehabilitation regime.: Yes Analysis of laboratory values and/or radiology reports with any subsequent need for medication adjustment and/or medical intervention. : Yes   I attest that I was present, lead the team conference, and concur with the assessment and plan of the team.   Ernest Pine 03/28/2022, 4:12 PM

## 2022-03-28 NOTE — Discharge Summary (Signed)
Physician Discharge Summary  Patient ID: Shirley Nichols MRN: [id number] DOB/AGE: 82-Sep-1942 82 y.o.  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 At approximately 1830, she was noted to have right arm and leg jerking with left gaze deviation. EEG to evaluate for seizure  Level of alertness: lethargic  AEDs during EEG study: LEV, PHT, Propofol  Technical aspects: This EEG study was done with scalp electrodes positioned according to the 10-20 International system of electrode placement. Electrical activity was reviewed with band pass filter of 1-_0 , sensitivity of 7 uV/mm, display speed of 22m/sec with a _1  notched filter applied as appropriate. EEG data were recorded continuously and digitally stored.  Video monitoring was available and reviewed as appropriate.  Description: EEG showed continuous generalized 3 to 6 Hz theta-delta slowing. Hyperventilation and photic stimulation were not performed.    Event button was pressed on 03/15/2022 at 1123, 1125, 1142, 1213,1218,1233, [id number] right lower extremity rhythmic twitching. Concomitant EEG before, during and after the event did not show any EEG change.  ABNORMALITY -  Continuous slow, generalized  IMPRESSION: This study is suggestive of moderate to severe diffuse encephalopathy, nonspecific etiology. No epileptiform discharges were seen throughout the recording.  Event button was pressed on 03/15/2022 at 1123, 1125, 1142, [phone] right lower extremity rhythmic twitching without concomitant EEG change. However, focal motor seizures may not be seen on scalp EEG. Therefore clinical correlation is recommended.    PLora Havens  DG Abd Portable 1V  Result Date: 03/15/2022 CLINICAL DATA:  Evaluate location of tip of enteric tube EXAM: PORTABLE ABDOMEN - 1 VIEW COMPARISON:  Previous studies done earlier today FINDINGS: There is slight interval withdrawal of the enteric tube. Tip and side port are still within the fundus of the stomach. Bowel gas pattern is nonspecific. Heart is enlarged in size. Bilateral pleural effusions are seen. IMPRESSION: Tip and side port of enteric tube are noted in the medial aspect of fundus of the stomach. Electronically Signed   By: PElmer PickerM.D.   On: 03/15/2022 14:30   DG Abd Portable 1V  Result Date: 03/15/2022 CLINICAL DATA:  Evaluate location of enteric tube EXAM: PORTABLE ABDOMEN - 1 VIEW COMPARISON:  Previous study done earlier today FINDINGS: There is interval repositioning of enteric tube with its tip in the fundus of the stomach. Biliary stent is seen in the course of distal common bile duct. Bowel gas pattern is nonspecific. IMPRESSION: Tip of enteric tube is seen in the fundus of the stomach. Electronically Signed   By: PElmer PickerM.D.   On: 03/15/2022 14:29   DG Abd Portable 1V  Result Date: 03/15/2022 CLINICAL DATA:  OG tube placement. EXAM: PORTABLE ABDOMEN - 1 VIEW COMPARISON:  One-view chest x-ray 03/14/2022 FINDINGS: Biliary stent is in place.  Bowel gas pattern is unremarkable. NG tube is coiled in the stomach. The tip is directed cephalad in the distal esophagus. The portion  directed upward in the esophagus has increased since the previous exam. Bilateral pleural effusions and basilar airspace disease noted. IMPRESSION: 1. NG tube is coiled in the stomach. The tip is directed cephalad in the distal esophagus. 2. Biliary stent in place. Electronically Signed   By: San Morelle M.D.   On: 03/15/2022 08:47   DG CHEST PORT 1 VIEW  Result Date: 03/14/2022 CLINICAL DATA:  Endotracheal tube EXAM: PORTABLE CHEST 1 VIEW COMPARISON:  Earlier today at 2:02 p.m. FINDINGS: 4:45 p.m. Support apparatus: Endotracheal tube terminates 4.6 cm above carina. Nasogastric tube placed in the interval, looped in the stomach with tip at distal esophagus. Right PICC line tip at superior caval/atrial junction. Heart/mediastinum: Midline trachea.  Moderate cardiomegaly Pleura: Small bilateral pleural effusions are similar. No pneumothorax. Lungs: Left-greater-than-right base airspace disease is not significantly changed.Moderate interstitial edema is increased. Other: Numerous leads and wires project over the chest. IMPRESSION: Appropriate position of endotracheal tube. Nasogastric tube looped in the stomach with tip at the distal esophagus. Similar congestive heart failure with layering bilateral pleural effusions and bibasilar airspace disease. These results will be called to the ordering clinician or representative by the Radiologist Assistant, and communication documented in the PACS or [employer]. Electronically Signed   By: Abigail Miyamoto M.D.   On: 03/14/2022 17:11   DG Abd Portable 1V  Result Date: 03/14/2022 CLINICAL DATA:  Abnormal liver function tests. Evaluate biliary stent EXAM: PORTABLE ABDOMEN - 1 VIEW COMPARISON:  CT chest done on 03/11/2022 FINDINGS: Bowel gas pattern is nonspecific. No abnormal masses or calcifications are seen. Increased density in left lower lung fields may suggest pleural effusion. There is a stent in right side of abdomen. The stent is vertically  oriented, possibly positioned in the distal common bile duct and duodenum. IMPRESSION: Nonspecific bowel gas pattern. A biliary stent is noted in right side of abdomen. The stent is more vertically oriented than usual. This may suggest presence of stent in the distal common bile duct and duodenum. Electronically Signed   By: Elmer Picker M.D.   On: 03/14/2022 15:48   DG Chest Port 1 View  Result Date: 03/14/2022 CLINICAL DATA:  Pulmonary aspiration.  Seizure. EXAM: PORTABLE CHEST 1 VIEW COMPARISON:  CT chest 03/11/2022 and chest radiograph 03/08/2022 FINDINGS: Hazy opacities at the lung bases compatible with lower lobe atelectasis or pneumonia likely superimposed on pleural effusions which were better shown on the 03/11/2022 exam. Right PICC line tip: SVC. Mild enlargement of the cardiopericardial silhouette. Indistinct pulmonary vasculature suggesting pulmonary venous hypertension. IMPRESSION: 1. Hazy opacities at the lung bases compatible with lower lobe atelectasis or pneumonia likely superimposed on pleural effusions. 2. Mild enlargement of the cardiopericardial silhouette with indistinct pulmonary vasculature suggesting pulmonary venous hypertension. Electronically Signed   By: Van Clines M.D.   On: 03/14/2022 14:31   DG FL GUIDED LUMBAR PUNCTURE  Result Date: 03/13/2022 CLINICAL DATA:  Acute encephalopathy with focal seizures. Radiology was consulted to perform diagnostic lumbar puncture under fluoroscopic guidance EXAM: DIAGNOSTIC LUMBAR PUNCTURE UNDER FLUOROSCOPIC GUIDANCE COMPARISON:  Head CT 03/10/2022 and abdominal CT 03/03/2022 were reviewed. FLUOROSCOPY: Radiation Exposure Index (as provided by the fluoroscopic device): 14.40 mGy Kerma PROCEDURE: Informed consent was obtained from the patient prior to the procedure, including potential complications of headache, allergy, and pain. With the patient prone, the lower back was prepped with Betadine.  1% Lidocaine was used for local  anesthesia. Lumbar puncture was performed at the L5-S1 level using a 20 gauge needle with return of clear CSF with an opening pressure of 36 cm water. 15 ml of CSF were obtained for laboratory studies. Closing pressure of 18 cm water. The patient tolerated the procedure well and there were no apparent complications. Procedure performed by Reatha Armour, PA under the supervision of Richardean Sale, MD. IMPRESSION: 1.  Technically successful lumbar puncture at L5-S1 level 2.  Opening pressure 36 cm water, closing pressure 18 cm water 3.  15 mL of clear CSF obtained for laboratory study 4.  Patient tolerated procedure well with no apparent complications Read by: Reatha Armour, PA-C Electronically Signed   By: Richardean Sale M.D.   On: 03/13/2022 15:17   Overnight EEG with video  Result Date: 03/13/2022 Lora Havens, MD     03/14/2022  9:20 AM Patient Name: Shirley Nichols MRN: [id number] Epilepsy Attending: Lora Havens Referring Physician/Provider: Katy Apo, NP Duration: 03/12/2022 1721 to 03/13/2022 1721  Patient history: 81yo F with right leg tremoring after the MRI and they also noted that she was talking gibberish all afternoon for 4 to 5 hours. At approximately 1830, she was noted to have right arm and leg jerking with left gaze deviation. EEG to evaluate for seizure  Level of alertness: Awake, asleep  AEDs during EEG study: LEV  Technical aspects: This EEG study was done with scalp electrodes positioned according to the 10-20 International system of electrode placement. Electrical activity was reviewed with band pass filter of 1-_0 , sensitivity of 7 uV/mm, display speed of 70m/sec with a _1  notched filter applied as appropriate. EEG data were recorded continuously and digitally stored.  Video monitoring was available and reviewed as appropriate.  Description: No clear posterior dominant rhythm was seen. Sleep was characterized by vertex waves, sleep spindles (12 to 14 Hz),  maximal frontocentral region. EEG showed continuous generalized and lateralized left hemisphere 3 to 6 Hz theta-delta slowing with overriding 12-_2  generalized beta activity. Hyperventilation and photic stimulation were not performed.   Event button was pressed on 03/13/2022 at 0958 for right lower extremity rhythmic twitching.  Concomitant EEG before, during and after the event did not show any EEG change.  ABNORMALITY - Continuous slow, generalized and lateralized left hemisphere  IMPRESSION: This study is suggestive of cortical dysfunction arising from left hemisphere likely secondary to underlying structural abnormality, post-ictal state. Additionally there is moderate to severe diffuse encephalopathy, nonspecific etiology. No epileptiform discharges were seen throughout the recording. Event button was pressed on 03/13/2022 at 0958 for rhythmic right lower extremity twitching without concomitant EEG change.  However, focal motor seizures may not be seen on scalp EEG.  Therefore clinical correlation is recommended.  Priyanka O Yadav   [place] SITE RITE  Result Date: 03/12/2022 If Site Rite image not attached, placement could not be confirmed due to current cardiac rhythm.  CT CHEST W CONTRAST  Result Date: 03/11/2022 CLINICAL DATA:  Pancreatic cancer. EXAM: CT CHEST WITH CONTRAST TECHNIQUE: Multidetector CT imaging of the chest was performed during intravenous contrast administration. RADIATION DOSE REDUCTION: This exam was performed according to the departmental dose-optimization program which includes automated exposure control, adjustment of the mA and/or kV according to patient size and/or use of iterative reconstruction technique. CONTRAST:  528mOMNIPAQUE IOHEXOL 350 MG/ML SOLN COMPARISON:  None Available. FINDINGS: Cardiovascular: The heart size is normal. No substantial pericardial effusion. Mild atherosclerotic calcification  is noted in the wall of the thoracic aorta. Mediastinum/Nodes: 2.1 cm  right thyroid nodule evident. Small mediastinal and right hilar lymph nodes evident. The esophagus has normal imaging features. There is no axillary lymphadenopathy. Lungs/Pleura: Collapse/consolidative opacity is seen in both lower lobes with small to moderate bilateral pleural effusions. Mild interlobular septal thickening noted in the upper lobes bilaterally. No overtly suspicious pulmonary nodule or mass. Upper Abdomen: 1.8 cm hypervascular subcapsular lesion seen in the dome of the liver (image 107/3). This was not visible on MRI of 03/03/2022. Pneumobilia evident. Left adrenal nodule is similar to previous MRI. Biliary stent device is been incompletely visualized. Musculoskeletal: No worrisome lytic or sclerotic osseous abnormality. IMPRESSION: 1. Collapse/consolidative opacity in both lower lobes with small to moderate bilateral pleural effusions. Imaging features are compatible with compressive atelectasis although superimposed pneumonia not excluded. 2. 1.8 cm hypervascular subcapsular lesion in the dome of the liver. This was not visible on MRI of 03/03/2022. This may be related to perfusion anomaly, but given the history of pancreatic cancer, close follow-up recommended. 3. 2.1 cm right thyroid nodule. In the setting of significant comorbidities or limited life expectancy, no follow-up recommended (ref: J Am Coll Radiol. 2015 Feb;12(2): 143-50). 4. Small mediastinal and right hilar lymph nodes, likely reactive. 5. Pneumobilia. Biliary stent device is been incompletely visualized. 6. Stable left adrenal nodule. This was incompletely characterized on previous MRI but felt favor a benign adrenal adenoma. 7.  Aortic Atherosclerosis (ICD10-I70.0). Electronically Signed   By: Misty Stanley M.D.   On: 03/11/2022 15:25   Overnight EEG with video  Result Date: 03/11/2022 Lora Havens, MD     03/11/2022 12:33 PM Patient Name: Shirley Nichols MRN: [id number] Epilepsy Attending: Lora Havens Referring  Physician/Provider: Katy Apo, NP Duration: 03/10/2022 1920 to 03/11/2022 1030 Patient history: 81yo F with right leg tremoring after the MRI and they also noted that she was talking gibberish all afternoon for 4 to 5 hours. At approximately 1830, she was noted to have right arm and leg jerking with left gaze deviation. EEG to evaluate for seizure Level of alertness: Awake, asleep AEDs during EEG study: LEV Technical aspects: This EEG study was done with scalp electrodes positioned according to the 10-20 International system of electrode placement. Electrical activity was reviewed with band pass filter of 1-_0 , sensitivity of 7 uV/mm, display speed of 68m/sec with a _1  notched filter applied as appropriate. EEG data were recorded continuously and digitally stored.  Video monitoring was available and reviewed as appropriate. Description: No clear posterior dominant rhythm was seen. Sleep was characterized by vertex waves, sleep spindles (12 to 14 Hz), maximal frontocentral region. EEG showed continuous generalized and lateralized left hemisphere 3 to 6 Hz theta-delta slowing with overriding 12-_2  generalized beta activity. Hyperventilation and photic stimulation were not performed.   ABNORMALITY - Continuous slow, generalized and lateralized left hemisphere IMPRESSION: This study is suggestive of cortical dysfunction arising from left hemisphere likely secondary to underlying structural abnormality, post-ictal state. Additionally there is moderate to severe diffuse encephalopathy, nonspecific etiology. No seizures or epileptiform discharges were seen throughout the recording. PLora Havens  CT HEAD CODE STROKE WO CONTRAST  Result Date: 03/10/2022 CLINICAL DATA:  Code stroke. EXAM: CT HEAD WITHOUT CONTRAST TECHNIQUE: Contiguous axial images were obtained from the base of the skull through the vertex without intravenous contrast. RADIATION DOSE REDUCTION: This exam was performed according  to the departmental dose-optimization program which includes automated exposure control, adjustment  of the mA and/or kV according to patient size and/or use of iterative reconstruction technique. COMPARISON:  No prior head CT, correlation is made with same day MRI head FINDINGS: Brain: No evidence of acute infarction, hemorrhage, cerebral edema, mass, mass effect, or midline shift. No hydrocephalus or extra-axial collection. Cerebral volume is within normal limits for age. Vascular: No hyperdense vessel. Skull: Negative for fracture or focal lesion. Sinuses/Orbits: Mucous retention cyst in the left maxillary sinus. Mucosal thickening in the ethmoid air cells and left frontal sinus. Status post bilateral lens replacements. Other: The mastoid air cells are well aerated. ASPECTS [facility] Stroke Program Early CT Score) - Ganglionic level infarction (caudate, lentiform nuclei, internal capsule, insula, M1-M3 cortex): 7 - Supraganglionic infarction (M4-M6 cortex): 3 Total score (0-10 with 10 being normal): 10 IMPRESSION: 1. No acute intracranial process. 2. ASPECTS is 10. Code stroke imaging results were communicated on 03/10/2022 at 6:57 pm to provider BHAGAT via secure text paging. Electronically Signed   By: Merilyn Baba M.D.   On: 03/10/2022 18:57   MR BRAIN W WO CONTRAST  Result Date: 03/10/2022 CLINICAL DATA:  81 year old female with confusion, TIA. Jaundice, suspected pancreatic head mass. EXAM: MRI HEAD WITHOUT AND WITH CONTRAST TECHNIQUE: Multiplanar, multiecho pulse sequences of the brain and surrounding structures were obtained without and with intravenous contrast. CONTRAST:  33m GADAVIST GADOBUTROL 1 MMOL/ML IV SOLN COMPARISON:  None Available. FINDINGS: Brain: Cerebral volume is within normal limits for age. No restricted diffusion to suggest acute infarction. No midline shift, mass effect, evidence of mass lesion, ventriculomegaly, extra-axial collection or acute intracranial hemorrhage.  Cervicomedullary junction and pituitary are within normal limits. 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 Otherwise mild for age patchy nonspecific white matter T2 and FLAIR hyperintensity (series 6, image 23). Occasional subtle chronic microhemorrhages in the brain (left frontal lobe series 7, image 58). Mild T2 heterogeneity in the bilateral basal ganglia and right thalamus. No cortical encephalomalacia. No abnormal enhancement identified. No dural thickening suspected on this 3 Tesla imaging. Vascular: Major intracranial vascular flow voids are preserved. The major dural venous sinuses are enhancing and appear to be patent. Skull and upper cervical spine: Negative for age visible cervical spine. Visualized bone marrow signal is within normal limits. Sinuses/Orbits: Postoperative changes to both globes. Paranasal sinuses and mastoids are well aerated. Other: Grossly normal visible internal auditory structures. Negative visible scalp and face. IMPRESSION: 1. No metastatic disease or acute intracranial abnormality. 2. 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 Electronically Signed   By: HGenevie AnnM.D.   On: 03/10/2022 12:50   DG CHEST PORT 1 VIEW  Result Date: 03/08/2022 CLINICAL DATA:  Acute encephalopathy.  Confusion. EXAM: PORTABLE CHEST 1 VIEW COMPARISON:  None Available. FINDINGS: The cardiac silhouette, mediastinal and hilar contours are within normal limits given the AP projection and portable technique. Asymmetric reticulonodular interstitial lung pattern more pronounced on the right. Findings could be due to chronic interstitial process or atypical/viral pneumonia or MAC. No focal pulmonary infiltrates or pleural effusions. No pulmonary lesions. IMPRESSION: Asymmetric reticulonodular interstitial lung pattern could be due to chronic interstitial process or atypical/viral pneumonia or MAC. Electronically Signed   By: PMarijo SanesM.D.   On:  03/08/2022 08:32   DG ERCP  Result Date: 03/06/2022 CLINICAL DATA:  Pancreatic mass, malignant biliary obstruction EXAM: ERCP TECHNIQUE: Multiple spot images obtained with the fluoroscopic device and submitted for interpretation post-procedure. FLUOROSCOPY: Radiation Exposure Index (as provided  by the fluoroscopic device): 74.051 mGy Kerma COMPARISON:  None Available. FINDINGS: Six fluoroscopic intraoperative radiographs are presented for interpretation postprocedurally. Initial image demonstrates wire cannulation of the pancreatic duct and subsequent retrograde wire cannulation of the common duct. Contrast installation demonstrates marked extrahepatic biliary ductal dilation with a roughly 4-5 cm stricture involving the distal common duct. Subsequent images demonstrate palliative biliary stenting of the distal extrahepatic duct with partial evacuation of biliary contrast. The intrahepatic biliary tree is not well opacified on this examination. No extraluminal extension of contrast identified. 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 Subsequent palliative biliary stenting of the stricture. These images were submitted for radiologic interpretation only. Please see the procedural report for the amount of contrast and the fluoroscopy time utilized. Electronically Signed   By: Fidela Salisbury M.D.   On: 03/06/2022 04:17   DG C-Arm 1-60 Min-No Report  Result Date: 03/05/2022 Fluoroscopy was utilized by the requesting physician.  No radiographic interpretation.   DG C-Arm 1-60 Min-No Report  Result Date: 03/05/2022 Fluoroscopy was utilized by the requesting physician.  No radiographic interpretation.   US RENAL  Result Date: 03/04/2022 CLINICAL DATA:  Renal dysfunction EXAM: RENAL / URINARY TRACT ULTRASOUND COMPLETE COMPARISON:  CT done on 03/03/2022 FINDINGS: Right Kidney: Renal  measurements: 10 x 4.5 x 5.4 cm = volume: 127.2 mL. There is no hydronephrosis. There is a 1.7 cm cyst in the medial aspect of right kidney. There is trace amount of free fluid adjacent to the right kidney. Left Kidney: Renal measurements: 11.5 x 6.2 x 5.2 cm = volume: 193 x 9 mL. There is no hydronephrosis. Technologist observed a 2.6 cm structure with focal bulge in the cortical margin in the midportion of left kidney. In MRI done on 03/03/2022, no definite significant focal abnormalities were noted in the left kidney. Bladder: Appears normal for degree of bladder distention. Other: None. IMPRESSION: There is no hydronephrosis. Minimal right perinephric fluid. Right renal cyst. There is focal bulge in cortical margin in the midportion of left kidney, possibly partial volume averaging artifact. In previous MR done on 03/03/2022, no significant focal left renal lesion was seen. Electronically Signed   By: Elmer Picker M.D.   On: 03/04/2022 14:44   MR ABDOMEN MRCP W WO CONTAST  Result Date: 03/03/2022 CLINICAL DATA:  Jaundice. Pancreatitis. Biliary ductal dilatation. Evaluate for choledocholithiasis or pancreatic mass. EXAM: MRI ABDOMEN WITHOUT AND WITH CONTRAST (INCLUDING MRCP) TECHNIQUE: Multiplanar multisequence MR imaging of the abdomen was performed both before and after the administration of intravenous contrast. Heavily T2-weighted images of the biliary and pancreatic ducts were obtained, and three-dimensional MRCP images were rendered by post processing. CONTRAST:  46m GADAVIST GADOBUTROL 1 MMOL/ML IV SOLN COMPARISON:  Noncontrast CT on 03/03/2022 FINDINGS: Lower chest: No acute findings. Hepatobiliary: Image degradation by motion artifact noted. No hepatic masses identified. Gallbladder is distended, however there are no other findings of acute cholecystitis. Diffuse biliary ductal dilatation is seen, measuring 15 mm in diameter. Stricture of the distal common bile duct is seen in the pancreatic  head. No evidence of choledocholithiasis. Pancreas: Image degradation by motion artifact noted. Mild ductal dilatation is seen in the pancreatic tail and body. A heterogeneously enhancing masslike density is seen in the pancreatic head, measuring 3.4 x 2.8 cm, highly suspicious for pancreatic carcinoma. Spleen:  Within normal limits in size and appearance. Adrenals/Urinary Tract: A 2.6 x 2.3 cm left adrenal mass  is seen, with evaluation limited by motion artifact. This appears homogeneous with mild T2 hypointensity, favoring a benign adrenal adenoma. No suspicious renal masses identified. No evidence of hydronephrosis. Stomach/Bowel: Unremarkable. Vascular/Lymphatic: No pathologically enlarged lymph nodes identified. No acute vascular findings. Other:  None. Musculoskeletal:  No suspicious bone lesions identified. IMPRESSION: Image degradation by motion artifact noted. 3.4 cm masslike density in the pancreatic head, highly suspicious for pancreatic carcinoma. 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 No evidence of choledocholithiasis. 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 Electronically Signed   By: Marlaine Hind M.D.   On: 03/03/2022 19:05   CT Abdomen Pelvis Wo Contrast  Result Date: 03/03/2022 CLINICAL DATA:  Elevated lipase; distended gallbladder and CBD on ultrasound earlier today; pancreatitis suspected with concern for obstructing mass EXAM: CT ABDOMEN AND PELVIS WITHOUT CONTRAST TECHNIQUE: Multidetector CT imaging of the abdomen and pelvis was performed following the standard protocol without IV contrast. RADIATION DOSE REDUCTION: This exam was performed according to the departmental dose-optimization program which includes automated exposure control, adjustment of the mA and/or kV according to patient size and/or use of iterative reconstruction technique. COMPARISON:  Ultrasound earlier today  FINDINGS: Lower chest: Coronary artery calcification.  No acute abnormality. Hepatobiliary: Distended gallbladder and common bile duct which measures approximately 11 mm in greatest dimension. No radiopaque Mormile. No suspicious hepatic lesion on noncontrast exam. Pancreas: No definite pancreatic duct dilation. There is mild haziness in the peripancreatic fat about the pancreatic head and uncinate process (series 2/image [id number]). Spleen: Normal in size without focal abnormality. Adrenals/Urinary Tract: Nodular low-density (< 10 HU) thickening of the left adrenal gland. Symmetric perinephric stranding. Bilateral focal cortical renal scarring. Low-attenuation lesions in the kidneys are statistically likely to represent cysts. No follow-up is required. Stomach/Bowel: Unremarkable stomach. Normal caliber large and small bowel. The appendix is not visualized. Vascular/Lymphatic: Aortic atherosclerosis. No enlarged abdominal or pelvic lymph nodes. Reproductive: Hysterectomy. Other: No free intraperitoneal fluid or air. Musculoskeletal: Degenerative changes pubic symphysis, both hips, SI joints and lower lumbar spine. Anterolisthesis L4 on L5. No acute osseous abnormality. IMPRESSION: Hazy stranding about the pancreatic head suggestive of pancreatitis. This is incompletely evaluated without IV contrast. Dilated gallbladder and common bile duct. MRI/MRCP is recommended to evaluate for choledocholithiasis or obstructing mass. Electronically Signed   By: Placido Sou M.D.   On: 03/03/2022 14:33   US Abdomen Limited RUQ (LIVER/GB)  Result Date: 03/03/2022 CLINICAL DATA:  Transaminitis EXAM: ULTRASOUND ABDOMEN LIMITED RIGHT UPPER QUADRANT COMPARISON:  None Available. FINDINGS: Gallbladder: There is layering sludge in the distended gallbladder lumen but no shadowing stones. There is no gallbladder wall thickening or pericholecystic fluid. Sonographic Murphy's sign was reported negative. Common bile duct: Diameter: 1 cm.   There is intrahepatic biliary ductal dilatation. Liver: No focal lesion identified. Within normal limits in parenchymal echogenicity. Portal vein is patent on color Doppler imaging with normal direction of blood flow towards the liver. Other: None. IMPRESSION: 1. Distended gallbladder with layering sludge but no shadowing stones or other evidence of acute cholecystitis. 2. Intra and extrahepatic biliary ductal dilatation. This may be due to choledocholithiasis or an obstructing mass. Recommend MRI/MRCP for further evaluation. Electronically Signed   By: Valetta Mole M.D.   On: 03/03/2022 13:22    Labs:  Basic Metabolic Panel: Recent Labs  Lab 03/24/22 0548 03/25/22 0913 03/26/22 0653 03/29/22 0645 03/30/22 0559  NA 137 136  --  140 140  K 3.4* 3.2* 3.7 4.0 4.5  CL 102 98  --  103 107  CO2 25 27  --  24 25  GLUCOSE 100* 180*  --  110* 111*  BUN 20 22  --  16 22  CREATININE 0.99 1.08*  --  1.03* 1.02*  CALCIUM 8.1* 8.6*  --  9.0 8.9  MG 1.2* 1.9  --  1.4* 1.5*  PHOS 3.7  --   --   --   --     CBC: Recent Labs  Lab 03/24/22 0548 03/29/22 0645 03/30/22 0559  WBC 5.3 6.2 6.7  NEUTROABS 3.4 3.6 3.9  HGB 8.0* 8.2* 8.1*  HCT 24.9* 25.2* 25.4*  MCV 81.4 80.8 83.0  PLT 301 330 320    CBG: Recent Labs  Lab 03/24/22 0759  GLUCAP 121*   Family history.  Negative for breast cancer colon cancer or rectal cancer  Brief HPI:   Shirley Nichols is a 81 y.o. right-handed female with history of hypertension hyperlipidemia tobacco use.  Per chart review lives with spouse independent prior to admission.  Presented 03/03/2022 to the emergency department [facility] for evaluation of elevated LFTs noted on outpatient blood work.  By report patient had seen her PCP August 2023 due to generalized weakness blood work was obtained and notified of elevated LFTs.  Blood work noted BUN 57 creatinine 2.46 alkaline phosphatase 739 AST 188 ALT 229 total bilirubin 5.1 lipase 342.  MRCP revealed a 3.4 cm masslike  density in the pancreatic head suspicious for pancreatic carcinoma with distal CBD stricture and diffuse biliary ductal dilatation.  GI services Dr. Vicente Males consulted transferred to [facility] for further evaluation.  Underwent ERCP 03/05/2022 per Dr. Benson Norway that showed the upper third of the main bile duct and middle third of the main bile duct dilated with a mass causing obstruction and underwent sphincterotomy and biliary stent placement with brush biopsy.  Hospital course complicated by acute respiratory failure short-term intubation.  Bouts of confusion altered mental status as well as shaking with right upper extremity jerking CT/MRI showed no metastatic disease or acute changes.  LP completed that was unrevealing.  EEG negative for seizures maintained on Keppra and Dilantin.  She did receive consult by ophthalmology for blurry vision OU with work-up unremarkable felt to be related to toxic metabolic encephalopathy.  LFTs continue to trend downward.  No current plan for repeat ERCP.  Follow-up AKI question CKD maintain gentle IV fluids latest creatinine 1.07 as well as persistent hypokalemia with supplement as needed.  Oncology services consulted for suspected pancreatic cancer CT of the chest completed 03/11/2022 showing 1.8 cm hypervascular subcapsular lesion in the dome of the liver that was not visible on MRI 03/03/2022 felt to be related to perfusion anomaly given history of suspected pancreatic cancer recommend ongoing follow-up outpatient.  Incidental findings of a 2.1 cm right thyroid nodule as well as stable left adrenal nodule current plan to address chemotherapy as outpatient.  Palliative care consulted to establish goals of care.  Maintain on Lovenox for DVT prophylaxis.  Therapy evaluations completed due to patient's decreased functional mobility was admitted for a comprehensive rehab program.   Hospital Course: Shirley Nichols was admitted to rehab 03/23/2022 for inpatient therapies to  consist of PT, ST and OT at least three hours five days a week. Past admission physiatrist, therapy team and rehab RN have worked together to provide customized collaborative inpatient rehab.  Pertaining to patient's acute toxic metabolic  encephalopathy EEG negative CT MRI no acute process.  Maintain on Keppra as well as Dilantin.  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 Benson Norway and would follow-up outpatient oncology services Dr. Alvy Bimler regarding possible chemotherapy.  Bouts of hypokalemia hypomagnesemia with supplement as indicated.  AKI follow-up chemistries latest creatinine 1.02 and potassium 4.5 and magnesium 1.5.  Bouts of constipation resolved with laxative assistance.   Blood pressures were monitored on TID basis and soft and monitored     Rehab course: During patient's stay in rehab weekly team conferences were held to monitor patient's progress, set goals and discuss barriers to discharge. At admission, patient required minimal assist 100 feet without assistive device minimal assist sit to stand  Physical exam.  Blood pressure 155/77 pulse 73 temperature 98.4 respirations 18 oxygen saturations 95% room air Constitutional.  No acute distress HEENT Head.  Normocephalic and atraumatic Eyes.  Pupils round and reactive to light no discharge without nystagmus Neck.  Supple nontender no JVD without thyromegaly Cardiac regular rate and rhythm without any extra sounds or murmur heard Abdomen.  Soft nontender positive bowel sounds without rebound Respiratory effort normal no respiratory distress without wheeze Musculoskeletal.  Strength 5 -/5 bilateral upper and lower extremities throughout Neurologic.  Oriented to person and time not to event.  No dysarthria.  Names 3/3 objects correctly.  He/She  has had improvement in activity tolerance, balance, postural control as well as ability to compensate for deficits.  He/She has had improvement in functional use RUE/LUE  and RLE/LLE as well as improvement in awareness.  Requires supervision for safety for standing dynamic balance.  Ambulates 180 feet supervision with rollator.  Completed 3/3 toileting task with contact-guard.  Ambulates to the bathroom contact-guard assist.  Gather his belongings for activities day living and homemaking.  SLP facilitated recall problem solving and air awareness planning supervision verbal cues for working memory.  It was discussed with family for supervision on discharge.  Full family teaching completed plan discharged home       Disposition: Discharge to home    Diet: Regular  Special Instructions: No driving smoking or alcohol  Follow-up outpatient oncology services Dr.Gorsuch to address possible need for chemotherapy as well as gastroenterology services Dr. Havery Moros  Medications at discharge. 1.  Tylenol as needed 2.  Keppra 750 mg p.o. twice daily 3.  Oxycodone 2.5 mg every 8 hours as needed pain 4.  Dilantin 300 mg p.o. nightly 5.  MiraLAX daily hold for loose stools 6.Mag Ox 800 mg BID 7.aspirin 81 mg daily 8.zetia 10 mg daily 9.Symbicort 160-4.5.mcg two puffs as needed 10.lescol XL 80 mg daily    30-35 minutes were spent completing discharge summary and discharge planning  Discharge Instructions     Ambulatory referral to Neurology   Complete by: As directed    An appointment is requested in approximately: 4 weeks toxic metabolic encephalopathy        Follow-up Information     Lovorn, Jinny Blossom, MD Follow up.   Specialty: Physical Medicine and Rehabilitation Why: No formal follow-up needed Contact information: [address] [place] [address] [phone]         Heath Lark, MD Follow up.   Specialty: Hematology and Oncology Why: Call for appointment Contact information: [place] [address] [phone]         Yetta Flock, MD Follow up.   Specialty: Gastroenterology Why: Call for appointment  Contact information: [place] [address] [phone]         Dwan Bolt, MD Follow up on 04/04/2022.   Specialty: General Surgery Why: Appointment @ 11:00 am be there at 10:30 am Contact information: [place] [address] [phone]                 Signed: Lavon Paganini Williamson 03/31/2022, 5:12 AM

## 2022-03-28 NOTE — Progress Notes (Signed)
Occupational Therapy Session Note  Patient Details  Name: Shirley Nichols MRN: [id number] Date of Birth: 03/25/1941  Today's Date: 03/28/2022 OT Individual Time: 1st Session  [id number], 2nd Session 1300-1415 OT Individual Time Calculation (min): 45 min, 75 min    Short Term Goals: Week 1:  OT Short Term Goal 1 (Week 1): STG's=LTG's d/t LOS  Skilled Therapeutic Interventions/Progress Updates:   1st Session:   Pt seen for 1st session with focus on simple ADL's, functional mobility, UE HEP including Family Education with husband for mirroring shower safety and transfer skills to stall shower with threshold in demonstration apartment. Pt resting in bed upon OT arrival reporting nursing students just assisted her to the bathroom and to complete light bathing. Pt agreeable to oral care standing level sink side. Pt moved supine to sit and sit to stand with S only. Amb without AD with close S. Stood 4 min for oral care. No pain reported this am. Seated rest in w/c briefly and then was able to self propel 30 ft x 2 with rest in between toward apt space. OT completed transport the rest of way and back to the room at the end of session. Pt able to step in and out of threshold frame with light hand hold to windowsill with OT educating both pt and husband on use of suction grab bars and where to obtain for d/c. Husband to purchase [employer] version that has safety alert buttons that show when not secure and reports he will purchase 2. Pt required close S in and out onto shower seat which pt has in the home already. Discussion in prepapration for Team Conference regarding discharge readiness from an OT perspective. Light UE HEP initiated building on theraband from previous sessions with breathing integration,. Scap retraction/protraction and sh elevation/depression. Recommending 2x per day with rests between sets of 10 reps each. Pt left at end of session with husband, safety measures and needs in place.     2nd Session:  Pt seen for 2nd skilled OT session with strong focus on energy conservation and balance carryover with use of rollator to access kitchen. Light item retrieval and transport training to simulate simple snack prep. Pt required 2 rests during w/c propulsion to kitchen ~ 35 ft each. Mod cues with novel device for brakes mngt and hand placement but able to amb in kitchen spaces with CGA. Functional reach x 3 trials with 3 light items B sides with 5-6" unilateral reach. Item transport from pantry closet to fridge and back with mod cues. OT transported pt to ortho gym for energy conservation via w/c. W/c to seat of  NuStep with close S to complete 4 sets of 3 minutes with BORG scale rating of 13 (somewhat hard) on Level 1 with rests in between sets. No pain reported only mild fatigue. Most likely pt will not require home OT for follow up as husband will be able to assist with carryover of ADL's and HEP. After session, OT transported pt in w/c until pt was able then to amb with rollator the last part of the hallway to bed ~ 50 ft with close S and mod cues to navigate and lock brakes prior to sitting EOB. Pt moved to supine for rest and OT left pt with bed exit engaged, needs and call button in reach with husband bedside.      Therapy Documentation Precautions:  Precautions Precautions: Fall Restrictions Weight Bearing Restrictions: No    Therapy/Group: Individual Therapy  Olga Coaster  Sherlean Foot 03/28/2022, 7:53 AM

## 2022-03-28 NOTE — Progress Notes (Signed)
PROGRESS NOTE   Subjective/Complaints: LBM last night-  Has therapy at 8am, so trying to get ready for them.  Feels pretty good this AM.      ROS:   Pt denies SOB, abd pain, CP, N/V/C/D, and vision changes     Objective:   No results found. No results for input(s): "WBC", "HGB", "HCT", "PLT" in the last 72 hours. Recent Labs    03/25/22 0913 03/26/22 0653  NA 136  --   K 3.2* 3.7  CL 98  --   CO2 27  --   GLUCOSE 180*  --   BUN 22  --   CREATININE 1.08*  --   CALCIUM 8.6*  --     Intake/Output Summary (Last 24 hours) at 03/28/2022 0827 Last data filed at 03/27/2022 1833 Gross per 24 hour  Intake 354 ml  Output --  Net 354 ml        Physical Exam: Vital Signs Blood pressure (!) 158/65, pulse 78, temperature 98.4 F (36.9 C), temperature source Oral, resp. rate 16, height '5\' 4"'$  (1.626 m), weight 59.8 kg, SpO2 98 %.      General: awake, alert, appropriate, sitting EOB trying to get changed for therapy into a shirt vs hospital gown; nursing in room last 1/2; NAD HENT: conjugate gaze; oropharynx moist-wearing head wrap CV: regular rate; no JVD Pulmonary: CTA B/L; no W/R/R- good air movement GI: soft, NT, ND, (+)BS- protuberant Psychiatric: appropriate Neurological: alert- delayed responses in questions, but interactive Extremities: No clubbing, cyanosis, or edema Skin: No evidence of breakdown, no evidence of rash Neurologic: Cranial nerves II through XII intact, motor strength is 5/5 in bilateral deltoid, bicep, tricep, grip, hip flexor, knee extensors, ankle dorsiflexor and plantar flexor Sensory exam normal sensation to light touch and proprioception in bilateral upper and lower extremities Cerebellar exam normal finger to nose to finger as well as heel to shin in bilateral upper and lower extremities Musculoskeletal: Full range of motion in all 4 extremities. No joint swelling      Assessment/Plan: 1. Functional deficits which require 3+ hours per day of interdisciplinary therapy in a comprehensive inpatient rehab setting. Physiatrist is providing close team supervision and 24 hour management of active medical problems listed below. Physiatrist and rehab team continue to assess barriers to discharge/monitor patient progress toward functional and medical goals  Care Tool:  Bathing    Body parts bathed by patient: Right arm, Left arm, Chest, Abdomen, Front perineal area, Left upper leg, Right upper leg, Face   Body parts bathed by helper: Buttocks, Right lower leg, Left lower leg     Bathing assist Assist Level: Moderate Assistance - Patient 50 - 74%     Upper Body Dressing/Undressing Upper body dressing   What is the patient wearing?: Dress    Upper body assist Assist Level: Minimal Assistance - Patient > 75%    Lower Body Dressing/Undressing Lower body dressing      What is the patient wearing?: Incontinence brief     Lower body assist Assist for lower body dressing: Moderate Assistance - Patient 50 - 74%     Toileting Toileting    Toileting assist Assist  for toileting: Moderate Assistance - Patient 50 - 74%     Transfers Chair/bed transfer  Transfers assist     Chair/bed transfer assist level: Contact Guard/Touching assist     Locomotion Ambulation   Ambulation assist      Assist level: Contact Guard/Touching assist Assistive device: No Device Max distance: 210 ft   Walk 10 feet activity   Assist     Assist level: Contact Guard/Touching assist Assistive device: No Device   Walk 50 feet activity   Assist    Assist level: Contact Guard/Touching assist Assistive device: No Device    Walk 150 feet activity   Assist    Assist level: Contact Guard/Touching assist Assistive device: No Device    Walk 10 feet on uneven surface  activity   Assist     Assist level: Contact Guard/Touching assist Assistive  device: Walker-rolling   Wheelchair     Assist Is the patient using a wheelchair?: Yes Type of Wheelchair: Manual    Wheelchair assist level: Dependent - Patient 0% (transport only this session)      Wheelchair 50 feet with 2 turns activity    Assist        Assist Level: Dependent - Patient 0%   Wheelchair 150 feet activity     Assist      Assist Level: Dependent - Patient 0%   Blood pressure (!) 158/65, pulse 78, temperature 98.4 F (36.9 C), temperature source Oral, resp. rate 16, height '5\' 4"'$  (1.626 m), weight 59.8 kg, SpO2 98 %.  Medical Problem List and Plan: 1. 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  EEG negative.  Continue Keppra 750 mg twice daily and Dilantin 300 mg nightly             -patient may shower             -ELOS/Goals: 10-14 days, Mod I PT/OT, I SLP goals  Con't CIR- first day of evaluations- PT, OT and SLP- pt doesn't want to stay longer than ~ 10 days, which if possible, I agree with, so she can start Cancer treatment.   10/31- team conference today to determine length of stay- will see if Oncology would want [place] to keep in a PICC line. If not, will d/c PICC 2.  Antithrombotics: -DVT/anticoagulation:  Pharmaceutical: Lovenox             -antiplatelet therapy: N/A   3. Pain Management: Oxycodone as needed  10/27- denies pain- con't meds prn 4. Mood/Behavior/Sleep: Provide emotional support             -antipsychotic agents: N/A 5. Neuropsych/cognition: This patient is not capable of making decisions on her own behalf. 6. Skin/Wound Care: Routine skin checks 7. Fluids/Electrolytes/Nutrition (hypokalemia, hypomagnasemia): Routine in and outs with follow-up chemistries           - Receiving intermittent IV K and Mag repletion; check admission labs and then would start on standing supplementation    10/27- pt's Mg is 1.2 and K_ 3.4- will replete with 4 G IV Mg and 20 mEq x2 of K+ and  recheck in AM 10/28 K+ still low increase KCL to 55mq BID recheck K+ in am   10/30- last K_ 3.7 and Mg was 1.9- doing better  10/31- labs in AM 8.  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  Status post ERCP with stenting 03/05/2022 per Dr. HBenson Norway  LFTs improving with latest total bilirubin 0.7.              -  Follow-up outpatient oncology services [place] for Recs regarding chemotherapy   10/27- pt would like to get out of rehab "as soon as possible"- doesn't want to be here more than ~ 10 days  10/31- will recheck CMP in AM 9.  AKI.   Follow-up chemistries      Latest Ref Rng & Units 03/26/2022    6:53 AM 03/25/2022    9:13 AM 03/24/2022    5:48 AM  BMP  Glucose 70 - 99 mg/dL  180  100   BUN 8 - 23 mg/dL  22  20   Creatinine 0.44 - 1.00 mg/dL  1.08  0.99   Sodium 135 - 145 mmol/L  136  137   Potassium 3.5 - 5.1 mmol/L 3.7  3.2  3.4   Chloride 98 - 111 mmol/L  98  102   CO2 22 - 32 mmol/L  27  25   Calcium 8.9 - 10.3 mg/dL  8.6  8.1    10/30- Last Cr 1.08- just a tiny bit elevated- will recheck later in week  10/31- will recheck in AM 10.  Constipation.  MiraLAX daily as well as Senokot-S tablet twice daily  10/31- LBM last night  I spent a total of 36   minutes on total care today- >50% coordination of care- due to team conference to determine length of stay and calling Oncology/d/w PA about PICC line and if should remove?   LOS: 5 days A FACE TO FACE EVALUATION WAS PERFORMED  Gerlene Glassburn 03/28/2022, 8:27 AM

## 2022-03-28 NOTE — Progress Notes (Signed)
Patient ID: Shirley Nichols, female   DOB: 06/17/1940, 81 y.o.   MRN: [id number]  Met with pt and husband to give team conference update regarding goals of mod/I-supervision level and discharge date of 11/3. Both feel she will be ready to go home on Friday and pt reports she needs this due to feeling somewhat depressed being in the hospital as long as she has been now. Made aware therapists do not recommend follow up therapies due to doing so well ambulating over 400 ft with PT. Ordered rollator via [employer] and it has already been delivered, told husband to take home if he wishes too. Continue to follow until Friday

## 2022-03-28 NOTE — Progress Notes (Signed)
Physical Therapy Session Note  Patient Details  Name: Shirley Nichols MRN: [id number] Date of Birth: 01-Jan-1941  Today's Date: 03/28/2022 PT Individual Time: 1500-1530 PT Individual Time Calculation (min): 30 min   Short Term Goals: Week 1:  PT Short Term Goal 1 (Week 1): =LTGs d/t ELOS  Skilled Therapeutic Interventions/Progress Updates: Pt presents supine in bed and agreeable to therapy.  Pt transfers to EOB w/ mod I and then sit to stand at rollator w/ supervision.  Pt amb 250' w/ rollator and supervision.  Pt educated on brake application/removal on rollator and for positioning in order to use seat.  Pt requires visual cues for use of brakes.  Pt performed gait and then positioning rollator at wall or mat table before sitting.  Spouse present in room and continued education for safe rollator use.  Pt returned to room and transferred sit to supine w/ mod I.  Bed alarm on and all needs in reach.     Therapy Documentation Precautions:  Precautions Precautions: Fall Restrictions Weight Bearing Restrictions: No General:   Vital Signs: Therapy Vitals Temp: 98.1 F (36.7 C) Temp Source: Oral Pulse Rate: 69 Resp: 17 BP: 118/60 Patient Position (if appropriate): Lying Oxygen Therapy SpO2: 100 % O2 Device: Room Air Pain:0/10     Therapy/Group: Individual Therapy  Shirley Nichols 03/28/2022, 3:55 PM

## 2022-03-28 NOTE — Progress Notes (Signed)
Physical Therapy Session Note  Patient Details  Name: Shirley Nichols MRN: [id number] Date of Birth: 11-20-1940  Today's Date: 03/28/2022 PT Individual Time: 1030-1125 PT Individual Time Calculation (min): 55 min   Short Term Goals: Week 1:  PT Short Term Goal 1 (Week 1): =LTGs d/t ELOS  Skilled Therapeutic Interventions/Progress Updates:      Therapy Documentation Precautions:  Precautions Precautions: Fall Restrictions Weight Bearing Restrictions: No  Pt received semi-reclined in bed, declines pain, and agreeable to PT session with emphasis on gait and high level balance/coordiantion training. Pt independent with bed mobility and supervision for sit to stand with rollator. Pt requires verbal cueing for AD management with transfers and ambulated to dayroom with supervision for safety. Pt requires supervision for safety for dynamic sitting balance while performing head turns and simultaneously reading card with list of numbers. Pt progressed to performing task in standing on level surface and on airex pad while performing dynamic reading head turn task with CGA for safety. Pt transitioned to forward and backward step up's from level to unlevel surface (airex pad) 2 x 8. Pt ambulated to room with rollator (S) and left semi-reclined in bed with all needs in reach and alarm on.     Therapy/Group: Individual Therapy  [name] [name] PT, DPT  03/28/2022, 7:51 AM

## 2022-03-29 LAB — CBC WITH DIFFERENTIAL/PLATELET
Abs Immature Granulocytes: 0.02 10*3/uL (ref 0.00–0.07)
Basophils Absolute: 0 10*3/uL (ref 0.0–0.1)
Basophils Relative: 1 %
Eosinophils Absolute: 0.3 10*3/uL (ref 0.0–0.5)
Eosinophils Relative: 4 %
HCT: 25.2 % — ABNORMAL LOW (ref 36.0–46.0)
Hemoglobin: 8.2 g/dL — ABNORMAL LOW (ref 12.0–15.0)
Immature Granulocytes: 0 %
Lymphocytes Relative: 31 %
Lymphs Abs: 1.9 10*3/uL (ref 0.7–4.0)
MCH: 26.3 pg (ref 26.0–34.0)
MCHC: 32.5 g/dL (ref 30.0–36.0)
MCV: 80.8 fL (ref 80.0–100.0)
Monocytes Absolute: 0.4 10*3/uL (ref 0.1–1.0)
Monocytes Relative: 6 %
Neutro Abs: 3.6 10*3/uL (ref 1.7–7.7)
Neutrophils Relative %: 58 %
Platelets: 330 10*3/uL (ref 150–400)
RBC: 3.12 MIL/uL — ABNORMAL LOW (ref 3.87–5.11)
RDW: 15.6 % — ABNORMAL HIGH (ref 11.5–15.5)
WBC: 6.2 10*3/uL (ref 4.0–10.5)
nRBC: 0 % (ref 0.0–0.2)

## 2022-03-29 LAB — COMPREHENSIVE METABOLIC PANEL
ALT: 31 U/L (ref 0–44)
AST: 26 U/L (ref 15–41)
Albumin: 2.6 g/dL — ABNORMAL LOW (ref 3.5–5.0)
Alkaline Phosphatase: 166 U/L — ABNORMAL HIGH (ref 38–126)
Anion gap: 13 (ref 5–15)
BUN: 16 mg/dL (ref 8–23)
CO2: 24 mmol/L (ref 22–32)
Calcium: 9 mg/dL (ref 8.9–10.3)
Chloride: 103 mmol/L (ref 98–111)
Creatinine, Ser: 1.03 mg/dL — ABNORMAL HIGH (ref 0.44–1.00)
GFR, Estimated: 54 mL/min — ABNORMAL LOW (ref >60)
Glucose, Bld: 110 mg/dL — ABNORMAL HIGH (ref 70–99)
Potassium: 4 mmol/L (ref 3.5–5.1)
Sodium: 140 mmol/L (ref 135–145)
Total Bilirubin: 0.5 mg/dL (ref 0.3–1.2)
Total Protein: 5.7 g/dL — ABNORMAL LOW (ref 6.5–8.1)

## 2022-03-29 LAB — MAGNESIUM: Magnesium: 1.4 mg/dL — ABNORMAL LOW (ref 1.7–2.4)

## 2022-03-29 MED ORDER — MAGNESIUM OXIDE -MG SUPPLEMENT 400 (240 MG) MG PO TABS
800.0000 mg | ORAL_TABLET | Freq: Two times a day (BID) | ORAL | Status: DC
  Administered 2022-03-29 – 2022-03-31 (×5): 800 mg via ORAL
  Filled 2022-03-29 (×5): qty 2

## 2022-03-29 MED ORDER — EZETIMIBE 10 MG PO TABS
10.0000 mg | ORAL_TABLET | Freq: Every day | ORAL | Status: DC
  Administered 2022-03-29 – 2022-03-31 (×3): 10 mg via ORAL
  Filled 2022-03-29 (×3): qty 1

## 2022-03-29 MED ORDER — MAGNESIUM SULFATE 4 GM/100ML IV SOLN
4.0000 g | Freq: Once | INTRAVENOUS | Status: DC
  Filled 2022-03-29: qty 100

## 2022-03-29 MED ORDER — ASPIRIN 81 MG PO TBEC
81.0000 mg | DELAYED_RELEASE_TABLET | Freq: Every day | ORAL | Status: DC
  Administered 2022-03-29 – 2022-03-31 (×3): 81 mg via ORAL
  Filled 2022-03-29 (×3): qty 1

## 2022-03-29 NOTE — Progress Notes (Signed)
Physical Therapy Discharge Summary  Patient Details  Name: Shirley Nichols MRN: [id number] Date of Birth: 01/29/1941  Date of Discharge from PT service:{Time; dates multiple:304500300}  {CHL IP REHAB PT TIME CALCULATION:304800500}   Patient has met {NUMBERS 0-12:18577} of {NUMBERS 0-12:18577} long term goals due to {due to:3041677}.  Patient to discharge at {level:3041678} level {LOA:3049010}.   Patient's care partner {care partner:3041650} to provide the necessary {assistance:3041652} assistance at discharge.  Reasons goals not met: ***  Recommendation:  Patient will benefit from ongoing skilled PT services in {setting:3041680} to continue to advance safe functional mobility, address ongoing impairments in ***, and minimize fall risk.  Equipment: {equipment:3041657}  Reasons for discharge: {Reason for discharge:3049018}  Patient/family agrees with progress made and goals achieved: {Pt/Family agree with progress/goals:3049020}  PT Discharge  Pain Interference Pain Interference Pain Effect on Sleep: 0. Does not apply - I have not had any pain or hurting in the past 5 days Pain Interference with Therapy Activities: 0. Does not apply - I have not received rehabilitationtherapy in the past 5 days Pain Interference with Day-to-Day Activities: 1. Rarely or not at all Cognition Overall Cognitive Status: Impaired/Different from baseline Arousal/Alertness: Awake/alert Orientation Level: Oriented X4 Memory: Impaired Awareness: Appears intact Problem Solving: Appears intact Safety/Judgment: Appears intact Sensation Sensation Light Touch: Impaired by gross assessment Proprioception: Appears Intact Additional Comments: light touch diminshed L LE L1, L 2 Coordination Gross Motor Movements are Fluid and Coordinated: Yes Fine Motor Movements are Fluid and Coordinated: Yes Coordination and Movement Description: demonstrates improvements in coordination/balance with gait Finger Nose  Finger Test: WFL's Heel Shin Test: WFL's Motor  Motor Motor: Within Functional Limits Motor - Discharge Observations: grossly coordination with transfers and gait with rollator  Mobility Bed Mobility Bed Mobility: Rolling Right;Rolling Left;Supine to Sit;Sit to Supine Rolling Right: Independent Rolling Left: Independent Supine to Sit: Independent Sit to Supine: Independent Transfers Transfers: Sit to Stand;Stand to Sit;Stand Pivot Transfers Sit to Stand: Independent with assistive device Stand to Sit: Independent with assistive device Stand Pivot Transfers: Independent with assistive device Transfer (Assistive device): Rollator Locomotion  Gait Ambulation: Yes Gait Assistance: Independent with assistive device Gait Distance (Feet): 500 Feet (ft) Assistive device: Rollator Gait Gait: Yes Gait Pattern: Within Functional Limits Gait velocity: decreased Stairs / Additional Locomotion Stairs: Yes Stairs Assistance: Independent with assistive device Stair Management Technique: One rail Left Number of Stairs: 12 Height of Stairs: 6 (inches) Ramp: Independent with assistive device Curb: Independent with assistive device Wheelchair Mobility Wheelchair Mobility: No  Trunk/Postural Assessment  Cervical Assessment Cervical Assessment: Within Functional Limits Thoracic Assessment Thoracic Assessment: Within Functional Limits Lumbar Assessment Lumbar Assessment: Within Functional Limits Postural Control Postural Control: Within Functional Limits  Balance Balance Balance Assessed: Yes Standardized Balance Assessment Standardized Balance Assessment: Berg Balance Test;Functional Gait Assessment Berg Balance Test Sit to Stand: Able to stand without using hands and stabilize independently Standing Unsupported: Able to stand safely 2 minutes Sitting with Back Unsupported but Feet Supported on Floor or Stool: Able to sit safely and securely 2 minutes Stand to Sit: Sits safely with  minimal use of hands Transfers: Able to transfer safely, minor use of hands Standing Unsupported with Eyes Closed: Able to stand 10 seconds safely Standing Ubsupported with Feet Together: Able to place feet together independently and stand 1 minute safely From Standing, Reach Forward with Outstretched Arm: Can reach confidently >25 cm (10") From Standing Position, Pick up Object from Floor: Able to pick up shoe safely and easily From Standing Position, Turn   to Look Behind Over each Shoulder: Looks behind from both sides and weight shifts well Turn 360 Degrees: Able to turn 360 degrees safely in 4 seconds or less Standing Unsupported, Alternately Place Feet on Step/Stool: Able to stand independently and safely and complete 8 steps in 20 seconds Standing Unsupported, One Foot in Front: Loses balance while stepping or standing Standing on One Leg: Able to lift leg independently and hold 5-10 seconds Total Score: 51 Static Sitting Balance Static Sitting - Balance Support: Feet supported Static Sitting - Level of Assistance: 7: Independent Dynamic Sitting Balance Dynamic Sitting - Balance Support: Feet supported;Feet unsupported;During functional activity Dynamic Sitting - Level of Assistance: 6: Modified independent (Device/Increase time) Static Standing Balance Static Standing - Balance Support: During functional activity;Bilateral upper extremity supported Static Standing - Level of Assistance: 6: Modified independent (Device/Increase time) Dynamic Standing Balance Dynamic Standing - Balance Support: During functional activity;Bilateral upper extremity supported Dynamic Standing - Level of Assistance: 6: Modified independent (Device/Increase time) Functional Gait  Assessment Gait assessed : Yes Gait Level Surface: Walks 20 ft, slow speed, abnormal gait pattern, evidence for imbalance or deviates 10-15 in outside of the 12 in walkway width. Requires more than 7 sec to ambulate 20 ft. Change  in Gait Speed: Able to change speed, demonstrates mild gait deviations, deviates 6-10 in outside of the 12 in walkway width, or no gait deviations, unable to achieve a major change in velocity, or uses a change in velocity, or uses an assistive device. Gait with Horizontal Head Turns: Performs head turns with moderate changes in gait velocity, slows down, deviates 10-15 in outside 12 in walkway width but recovers, can continue to walk. Gait with Vertical Head Turns: Performs task with moderate change in gait velocity, slows down, deviates 10-15 in outside 12 in walkway width but recovers, can continue to walk. Gait and Pivot Turn: Pivot turns safely in greater than 3 sec and stops with no loss of balance, or pivot turns safely within 3 sec and stops with mild imbalance, requires small steps to catch balance. Step Over Obstacle: Is able to step over one shoe box (4.5 in total height) but must slow down and adjust steps to clear box safely. May require verbal cueing. Gait with Narrow Base of Support: Ambulates 7-9 steps. Gait with Eyes Closed: Walks 20 ft, slow speed, abnormal gait pattern, evidence for imbalance, deviates 10-15 in outside 12 in walkway width. Requires more than 9 sec to ambulate 20 ft. Ambulating Backwards: Walks 20 ft, slow speed, abnormal gait pattern, evidence for imbalance, deviates 10-15 in outside 12 in walkway width. Steps: Two feet to a stair, must use rail. Total Score: 13 Extremity Assessment  RLE Assessment RLE Assessment: Within Functional Limits General Strength Comments: Grossly 5/5 LLE Assessment LLE Assessment: Within Functional Limits General Strength Comments: grossly 5/5       PT, DPT  03/29/2022, 3:11 PM 

## 2022-03-29 NOTE — Progress Notes (Signed)
Physical Therapy Session Note  Patient Details  Name: [name] MRN: [id number] Date of Birth: 1940/06/04  Today's Date: 03/29/2022 PT Individual Time: 0902-1020 PT Individual Time Calculation (min): 78 min   Short Term Goals: Week 1:  PT Short Term Goal 1 (Week 1): =LTGs d/t ELOS  Skilled Therapeutic Interventions/Progress Updates:      Therapy Documentation Precautions:  Precautions Precautions: Fall Restrictions Weight Bearing Restrictions: No  Other Treatments:    Pt received semi-reclined in bed agreeable to PT session. Pt reports L hip pain 5/10 and declined medical intervention. PT provided soft tissue mobilization and trigger point manual therapy to left quad to address pain.  Pt independent with bed mobility and supervision for safety with sit to stand and ambulation to main gym. PT assessed pain interference, sensation, strength, coordination, and balance in preparation for discharge. Pt navigated 12 steps with unilateral handrail to simulate home environment with supervision for safety. Pt performed gluteal bridges 3 x 8 to address LE coordination deficits. Pt transitioned to gait training 400 ft  with 1.5# ankle weights positioned bilaterally to faciliate strength and improvements in activity tolerance. Pt performed gait with high knee's and lateral stepping with ankle weights with no HHA for safety. Pt ambulated to room and left semi-reclined in bed with all needs in reach and bed alarm on.    Therapy/Group: Individual Therapy  [name] [name] PT, DPT  03/29/2022, 7:45 AM

## 2022-03-29 NOTE — Progress Notes (Signed)
PROGRESS NOTE   Subjective/Complaints: Pt reports doing well- main issue is has something between her teeth and needs dental floss. LBM this AM    ROS:  Pt denies SOB, abd pain, CP, N/V/C/D, and vision changes   Objective:   No results found. Recent Labs    03/29/22 0645  WBC 6.2  HGB 8.2*  HCT 25.2*  PLT 330   Recent Labs    03/29/22 0645  NA 140  K 4.0  CL 103  CO2 24  GLUCOSE 110*  BUN 16  CREATININE 1.03*  CALCIUM 9.0    Intake/Output Summary (Last 24 hours) at 03/29/2022 1951 Last data filed at 03/29/2022 1751 Gross per 24 hour  Intake 230 ml  Output --  Net 230 ml        Physical Exam: Vital Signs Blood pressure (!) 154/59, pulse 83, temperature 98.6 F (37 C), temperature source Oral, resp. rate 18, height '5\' 4"'$  (1.626 m), weight 59.8 kg, SpO2 94 %.       General: awake, alert, appropriate, sitting EOB eating; said has something in teeth, NAD HENT: conjugate gaze; oropharynx moist CV: regular rate; no JVD Pulmonary: CTA B/L; no W/R/R- good air movement GI: soft, NT, ND, (+)BS Psychiatric: appropriate Neurological: alert Extremities: No clubbing, cyanosis, or edema Skin: No evidence of breakdown, no evidence of rash Neurologic: Cranial nerves II through XII intact, motor strength is 5/5 in bilateral deltoid, bicep, tricep, grip, hip flexor, knee extensors, ankle dorsiflexor and plantar flexor Sensory exam normal sensation to light touch and proprioception in bilateral upper and lower extremities Cerebellar exam normal finger to nose to finger as well as heel to shin in bilateral upper and lower extremities Musculoskeletal: Full range of motion in all 4 extremities. No joint swelling     Assessment/Plan: 1. Functional deficits which require 3+ hours per day of interdisciplinary therapy in a comprehensive inpatient rehab setting. Physiatrist is providing close team supervision and 24  hour management of active medical problems listed below. Physiatrist and rehab team continue to assess barriers to discharge/monitor patient progress toward functional and medical goals  Care Tool:  Bathing    Body parts bathed by patient: Right arm, Left arm, Chest, Abdomen, Front perineal area, Left upper leg, Right upper leg, Face, Buttocks, Right lower leg, Left lower leg   Body parts bathed by helper: Buttocks, Right lower leg, Left lower leg     Bathing assist Assist Level: Contact Guard/Touching assist     Upper Body Dressing/Undressing Upper body dressing   What is the patient wearing?: Pull over shirt    Upper body assist Assist Level: Supervision/Verbal cueing    Lower Body Dressing/Undressing Lower body dressing      What is the patient wearing?: Pants, Underwear/pull up     Lower body assist Assist for lower body dressing: Contact Guard/Touching assist     Toileting Toileting    Toileting assist Assist for toileting: Supervision/Verbal cueing     Transfers Chair/bed transfer  Transfers assist     Chair/bed transfer assist level: Contact Guard/Touching assist     Locomotion Ambulation   Ambulation assist      Assist level: Supervision/Verbal cueing  Assistive device: Rollator Max distance: 250   Walk 10 feet activity   Assist     Assist level: Supervision/Verbal cueing Assistive device: Rollator   Walk 50 feet activity   Assist    Assist level: Supervision/Verbal cueing Assistive device: Rollator    Walk 150 feet activity   Assist    Assist level: Supervision/Verbal cueing Assistive device: Rollator    Walk 10 feet on uneven surface  activity   Assist     Assist level: Contact Guard/Touching assist Assistive device: Walker-rolling   Wheelchair     Assist Is the patient using a wheelchair?: Yes Type of Wheelchair: Manual    Wheelchair assist level: Dependent - Patient 0% (transport only this session)       Wheelchair 50 feet with 2 turns activity    Assist        Assist Level: Dependent - Patient 0%   Wheelchair 150 feet activity     Assist      Assist Level: Dependent - Patient 0%   Blood pressure (!) 154/59, pulse 83, temperature 98.6 F (37 C), temperature source Oral, resp. rate 18, height '5\' 4"'$  (1.626 m), weight 59.8 kg, SpO2 94 %.  Medical Problem List and Plan: 1. 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  EEG negative.  Continue Keppra 750 mg twice daily and Dilantin 300 mg nightly             -patient may shower             -ELOS/Goals: 10-14 days, Mod I PT/OT, I SLP goals  Con't CIR- first day of evaluations- PT, OT and SLP- pt doesn't want to stay longer than ~ 10 days, which if possible, I agree with, so she can start Cancer treatment.   11/1- d/c Friday-   Con't CIR- PT and OT and SLP- got PICC out 2.  Antithrombotics: -DVT/anticoagulation:  Pharmaceutical: Lovenox             -antiplatelet therapy: N/A   3. Pain Management: Oxycodone as needed  10/27- denies pain- con't meds prn 4. Mood/Behavior/Sleep: Provide emotional support             -antipsychotic agents: N/A 5. Neuropsych/cognition: This patient is not capable of making decisions on her own behalf. 6. Skin/Wound Care: Routine skin checks 7. Fluids/Electrolytes/Nutrition (hypokalemia, hypomagnasemia): Routine in and outs with follow-up chemistries           - Receiving intermittent IV K and Mag repletion; check admission labs and then would start on standing supplementation    10/27- pt's Mg is 1.2 and K_ 3.4- will replete with 4 G IV Mg and 20 mEq x2 of K+ and recheck in AM 10/28 K+ still low increase KCL to 51mq BID recheck K+ in am   10/30- last K_ 3.7 and Mg was 1.9- doing better  10/31- labs in AM 8.  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  Status post ERCP with stenting 03/05/2022 per Dr. HBenson [place]  LFTs  improving with latest total bilirubin 0.7.              - Follow-up outpatient oncology services [facility] chemotherapy   10/27- pt would like to get out of rehab "as soon as possible"- doesn't want to be here more than ~ 10 days  11/1- will check CMP in AM 9.  AKI.   Follow-up chemistries      Latest Ref Rng & Units 03/29/2022  6:45 AM 03/26/2022    6:53 AM 03/25/2022    9:13 AM  BMP  Glucose 70 - 99 mg/dL 110   180   BUN 8 - 23 mg/dL 16   22   Creatinine 0.44 - 1.00 mg/dL 1.03   1.08   Sodium 135 - 145 mmol/L 140   136   Potassium 3.5 - 5.1 mmol/L 4.0  3.7  3.2   Chloride 98 - 111 mmol/L 103   98   CO2 22 - 32 mmol/L 24   27   Calcium 8.9 - 10.3 mg/dL 9.0   8.6    10/30- Last Cr 1.08- just a tiny bit elevated- will recheck later in week  11/1- labs in AM 10.  Constipation.  MiraLAX daily as well as Senokot-S tablet twice daily  10/31- LBM last night  11/1- going daily   LOS: 6 days A FACE TO FACE EVALUATION WAS PERFORMED  Shirley Nichols 03/29/2022, 7:51 PM

## 2022-03-29 NOTE — Progress Notes (Signed)
Occupational Therapy Session Note  Patient Details  Name: Shirley Nichols MRN: [id number] Date of Birth: 1940-11-14  Today's Date: 03/29/2022 OT Individual Time: 0903-0149 OT Individual Time Calculation (min): 73 min    Short Term Goals: Week 1:  OT Short Term Goal 1 (Week 1): STG's=LTG's d/t LOS  Skilled Therapeutic Interventions/Progress Updates:   Pt resting in bed upon OT arrival and was adament not to perform planned shower. OT and husband tried to encourage to complete before Grad Day tomorrow but pt was not feeling up to it and reported being cold. OT will reattempt tomorrow but was able to simulate bathing re-training seated and standing on shower seat in prep for discharge with brief Family Educ with husband. Energy conservation reviewed wiith + pt and family understanding. OT transported pt in w/c to and from therapy spaces for bed access in demo apartment on and off with S only. Pt then able to perform Airex Foam reach and standing tolerance in kitchen during simulated simple meal prep for 3 trials of 4 minutes with close s and 5-6" functional unilateral reach this session. In ortho gym space, pt performed 1 lb cane ex bar in standing amb to complete 12 reps x 2 sets sh flex, shoulder rotation forward and backward and horizontal abd/add with seated rest between series. No pain reported however requires increased rest breaks overall. Transported back to pt's hallway and pt amb with rollator 25 ft back to bed with S only. Left pt bed level with husband present and NT care from [place] NT.     Therapy Documentation Precautions:  Precautions Precautions: Fall Restrictions Weight Bearing Restrictions: No   Therapy/Group: Individual Therapy  Barnabas Lister 03/29/2022, 7:53 AM

## 2022-03-29 NOTE — Progress Notes (Signed)
Occupational Therapy Session Note  Patient Details  Name: Shirley Nichols MRN: [id number] Date of Birth: 02-12-1941  Today's Date: 03/29/2022 OT Individual Time: 0800-0830 OT Individual Time Calculation (min): 30 min    Short Term Goals: Week 1:  OT Short Term Goal 1 (Week 1): STG's=LTG's d/t LOS  Skilled Therapeutic Interventions/Progress Updates:  Skilled OT intervention completed with focus on ambulatory and standing endurance with higher level dynamic balance component needed for independence with BADLs. Pt received upright in bed, agreeable to session. No pain reported however reported fatigue throughout session. Therapist provided extended rest breaks throughout to accommodate pt needs.   Declined self-care needs. Mod I bed mobility to EOB, supervision sit > stands and ambulatory transfers intermittently without device and with rollator throughout session.   Ambulated to ortho gym with cues needed for managing doors with rollator to promote safety and balance, with seated rest break needed for fatigue once in gym. Remained standing for duration of retrieving squigz from window seal to placing on high level of window. Pt stated "this is wearing me out," requiring another seated rest break before retrieving squigz from high level. Pt did demo comfort with UE support on counter or window ledge for balance during task.   Pt indicated inability to walk back to room due to fatigue, with therapist retrieving w/c for dependent transport back to room. Transitioned back into bed with mod I. Pt remained upright in bed, with bed alarm on/activated, all 4 bed rails up per pt request and with all needs in reach at end of session.   Therapy Documentation Precautions:  Precautions Precautions: Fall Restrictions Weight Bearing Restrictions: No    Therapy/Group: Individual Therapy  Blase Mess, MS, OTR/L  03/29/2022, 8:50 AM

## 2022-03-29 NOTE — Progress Notes (Signed)
Speech Language Pathology Discharge Summary  Patient Details  Name: Shirley Nichols MRN: [id number] Date of Birth: 06-06-1940  Date of Discharge from SLP service:March 29, 2022  Today's Date: 03/29/2022 SLP Individual Time: 1046-1130 SLP Individual Time Calculation (min): 44 min   Skilled Therapeutic Interventions:  Skilled ST services focused on education and cognitive skills. SLP facilitated sustained attention, mildly complex problem solving, error awareness and recall in functional calendar task. Pt required min A verbal cues for problem solving, mod-min A verbal cues for short term recall and total A for delayed recall. Pt demonstrated reduce error awareness and executive function/planning. SLP initiated memory notebook, pt require dmin A verbal cues to recall session events. SLP provided education to pt and pt's husband pertaining to need for assistance with IADLs , memory, attention, mildly complex problem solving and noticing errors. All questions answered to satisfaction.     Patient has met 3 of 4 long term goals.  Patient to discharge at overall Supervision;Min level.  Reasons goals not met: emergent awareness and shorter than exoected d/c   Clinical Impression/Discharge Summary:   Pt made good progress meeting 3 out 4 goals. Pt is discharging at min-supervision A with primary deficit of recall, then attention which impacts problem solving and acuity of errors. Pt demonstrated good improvement in cognitive skills and education has been completed. Pt's barriers are the deficits listed above. Pt benefited from skilled ST services in order to maximize functional independence and reduce burden of care requiring 24 hour supervision and continued ST services.  Care Partner:  Caregiver Able to Provide Assistance: Yes  Type of Caregiver Assistance: Cognitive;Physical  Recommendation:  24 hour supervision/assistance;Outpatient SLP  Rationale for SLP Follow Up: Maximize functional  communication;Maximize cognitive function and independence;Reduce caregiver burden   Equipment: N/A   Reasons for discharge: Discharged from hospital   Patient/Family Agrees with Progress Made and Goals Achieved: Yes    Nigel Ericsson  [facility] 03/29/2022, 4:19 PM

## 2022-03-30 ENCOUNTER — Other Ambulatory Visit (HOSPITAL_COMMUNITY): Payer: Self-pay

## 2022-03-30 LAB — CBC WITH DIFFERENTIAL/PLATELET
Abs Immature Granulocytes: 0.03 10*3/uL (ref 0.00–0.07)
Basophils Absolute: 0.1 10*3/uL (ref 0.0–0.1)
Basophils Relative: 1 %
Eosinophils Absolute: 0.3 10*3/uL (ref 0.0–0.5)
Eosinophils Relative: 4 %
HCT: 25.4 % — ABNORMAL LOW (ref 36.0–46.0)
Hemoglobin: 8.1 g/dL — ABNORMAL LOW (ref 12.0–15.0)
Immature Granulocytes: 0 %
Lymphocytes Relative: 29 %
Lymphs Abs: 2 10*3/uL (ref 0.7–4.0)
MCH: 26.5 pg (ref 26.0–34.0)
MCHC: 31.9 g/dL (ref 30.0–36.0)
MCV: 83 fL (ref 80.0–100.0)
Monocytes Absolute: 0.5 10*3/uL (ref 0.1–1.0)
Monocytes Relative: 7 %
Neutro Abs: 3.9 10*3/uL (ref 1.7–7.7)
Neutrophils Relative %: 59 %
Platelets: 320 10*3/uL (ref 150–400)
RBC: 3.06 MIL/uL — ABNORMAL LOW (ref 3.87–5.11)
RDW: 15.9 % — ABNORMAL HIGH (ref 11.5–15.5)
WBC: 6.7 10*3/uL (ref 4.0–10.5)
nRBC: 0 % (ref 0.0–0.2)

## 2022-03-30 LAB — COMPREHENSIVE METABOLIC PANEL
ALT: 29 U/L (ref 0–44)
AST: 21 U/L (ref 15–41)
Albumin: 2.5 g/dL — ABNORMAL LOW (ref 3.5–5.0)
Alkaline Phosphatase: 158 U/L — ABNORMAL HIGH (ref 38–126)
Anion gap: 8 (ref 5–15)
BUN: 22 mg/dL (ref 8–23)
CO2: 25 mmol/L (ref 22–32)
Calcium: 8.9 mg/dL (ref 8.9–10.3)
Chloride: 107 mmol/L (ref 98–111)
Creatinine, Ser: 1.02 mg/dL — ABNORMAL HIGH (ref 0.44–1.00)
GFR, Estimated: 55 mL/min — ABNORMAL LOW (ref >60)
Glucose, Bld: 111 mg/dL — ABNORMAL HIGH (ref 70–99)
Potassium: 4.5 mmol/L (ref 3.5–5.1)
Sodium: 140 mmol/L (ref 135–145)
Total Bilirubin: 0.5 mg/dL (ref 0.3–1.2)
Total Protein: 5.6 g/dL — ABNORMAL LOW (ref 6.5–8.1)

## 2022-03-30 LAB — MAGNESIUM: Magnesium: 1.5 mg/dL — ABNORMAL LOW (ref 1.7–2.4)

## 2022-03-30 MED ORDER — FLUVASTATIN SODIUM ER 80 MG PO TB24
80.0000 mg | ORAL_TABLET | Freq: Every day | ORAL | 0 refills | Status: DC
  Filled 2022-03-30: qty 30, 30d supply, fill #0

## 2022-03-30 MED ORDER — OXYCODONE HCL 5 MG PO TABS
2.5000 mg | ORAL_TABLET | Freq: Three times a day (TID) | ORAL | 0 refills | Status: DC | PRN
  Filled 2022-03-30: qty 10, 7d supply, fill #0

## 2022-03-30 MED ORDER — SYMBICORT 160-4.5 MCG/ACT IN AERO
2.0000 | INHALATION_SPRAY | RESPIRATORY_TRACT | 4 refills | Status: DC | PRN
  Filled 2022-03-30: qty 10.2, 30d supply, fill #0

## 2022-03-30 MED ORDER — ACETAMINOPHEN 325 MG PO TABS: 650.0000 mg | ORAL_TABLET | Freq: Four times a day (QID) | ORAL | Status: DC | PRN

## 2022-03-30 MED ORDER — LEVETIRACETAM 750 MG PO TABS
750.0000 mg | ORAL_TABLET | Freq: Two times a day (BID) | ORAL | 0 refills | Status: DC
  Filled 2022-03-30: qty 60, 30d supply, fill #0

## 2022-03-30 MED ORDER — EZETIMIBE 10 MG PO TABS
10.0000 mg | ORAL_TABLET | Freq: Every day | ORAL | 0 refills | Status: DC
  Filled 2022-03-30: qty 30, 30d supply, fill #0

## 2022-03-30 MED ORDER — POLYETHYLENE GLYCOL 3350 17 G PO PACK: 17.0000 g | PACK | Freq: Every day | ORAL | 0 refills | Status: DC

## 2022-03-30 MED ORDER — MAGNESIUM OXIDE 400 MG PO TABS
800.0000 mg | ORAL_TABLET | Freq: Two times a day (BID) | ORAL | 0 refills | Status: DC
  Filled 2022-03-30: qty 108, 27d supply, fill #0

## 2022-03-30 MED ORDER — SENNOSIDES-DOCUSATE SODIUM 8.6-50 MG PO TABS: 1.0000 | ORAL_TABLET | Freq: Two times a day (BID) | ORAL | Status: DC

## 2022-03-30 MED ORDER — PHENYTOIN SODIUM EXTENDED 300 MG PO CAPS
300.0000 mg | ORAL_CAPSULE | Freq: Every day | ORAL | 0 refills | Status: DC
  Filled 2022-03-30: qty 30, 30d supply, fill #0

## 2022-03-30 NOTE — Progress Notes (Signed)
Inpatient Rehabilitation Care Coordinator Discharge Note   Patient Details  Name: Shirley Nichols MRN: [id number] Date of Birth: [birth year]   Discharge location: [place]  Length of Stay: 8 DAYS  Discharge activity level: SUPERVISION-MOD/I LEVEL  Home/community participation: ACTIVE  Patient response RC:BULAGT Literacy - How often do you need to have someone help you when you read instructions, pamphlets, or other written material from your doctor or pharmacy?: Never  Patient response XM:IWOEHO Isolation - How often do you feel lonely or isolated from those around you?: Never  Services provided included: MD, RD, PT, OT, SLP, RN, CM, TR, Pharmacy, Neuropsych, SW  Financial Services:  Charity fundraiser Utilized: [place] offered to/list presented to: PT AND HUSBAND  Follow-up services arranged:  DME      DME : [employer] [facility] NO OTHER NEEDS. THERAPY TEAM DID NOT RECOMMEND FOLLOW UP [place] OR OUTPATIENT THERAPIES    Patient response to transportation need: Is the patient able to respond to transportation needs?: Yes In the past 12 months, has lack of transportation kept you from medical appointments or from getting medications?: No In the past 12 months, has lack of transportation kept you from meetings, work, or from getting things needed for daily living?: No    Comments (or additional information): [place]. BOTH FEL COMFORTABLE WITH DISCHARGING HOME.  Patient/Family verbalized understanding of follow-up arrangements:  Yes  Individual responsible for coordination of the follow-up plan: BOBBY-HUSBAND [phone]  Confirmed correct DME delivered: Elease Hashimoto 03/30/2022    Elease Hashimoto

## 2022-03-30 NOTE — Progress Notes (Signed)
Patient ID: Shirley Nichols, female   DOB: 1940-09-22, 81 y.o.   MRN: [id number] Met with husband to discuss HCPOA and advanced directives. Have left packet in room for him and his wife and daughter's to complete. He has a notorary at his work. Pt also has a cancer policy she has paid on for 20 years and will need to look into this.

## 2022-03-30 NOTE — Progress Notes (Signed)
Inpatient Rehabilitation Discharge Medication Review by a Pharmacist  A complete drug regimen review was completed for this patient to identify any potential clinically significant medication issues.  High Risk Drug Classes Is patient taking? Indication by Medication  Antipsychotic No   Anticoagulant No   Antibiotic No   Opioid Yes Oxycodone prn pain  Antiplatelet No   Hypoglycemics/insulin No   Vasoactive Medication No   Chemotherapy No   Other Yes Zetia, Fluvastatin - HLD Keppra, Phenytoin - seizures Symbicort - asthma     Type of Medication Issue Identified Description of Issue Recommendation(s)  Drug Interaction(s) (clinically significant)     Duplicate Therapy     Allergy     No Medication Administration End Date     Incorrect Dose     Additional Drug Therapy Needed     Significant med changes from prior encounter (inform family/care partners about these prior to discharge).    Other       Clinically significant medication issues were identified that warrant physician communication and completion of prescribed/recommended actions by midnight of the next day:  No    Time spent performing this drug regimen review (minutes):  30 minutes   Tad Moore 03/30/2022 7:53 AM

## 2022-03-30 NOTE — Progress Notes (Signed)
PROGRESS NOTE   Subjective/Complaints: LBM daily- No issues-  No pain, slept well.  Ate <10% of breakfast- says not hungry since isn't "doing much".   Getting resp giving neb treatment ROS:  Pt denies SOB, abd pain, CP, N/V/C/D, and vision changes   Objective:   No results found. Recent Labs    03/29/22 0645 03/30/22 0559  WBC 6.2 6.7  HGB 8.2* 8.1*  HCT 25.2* 25.4*  PLT 330 320   Recent Labs    03/29/22 0645 03/30/22 0559  NA 140 140  K 4.0 4.5  CL 103 107  CO2 24 25  GLUCOSE 110* 111*  BUN 16 22  CREATININE 1.03* 1.02*  CALCIUM 9.0 8.9    Intake/Output Summary (Last 24 hours) at 03/30/2022 0958 Last data filed at 03/29/2022 1751 Gross per 24 hour  Intake 220 ml  Output --  Net 220 ml        Physical Exam: Vital Signs Blood pressure (!) 140/54, pulse 78, temperature 98.3 F (36.8 C), temperature source Oral, resp. rate 16, height '5\' 4"'$  (1.626 m), weight 59.8 kg, SpO2 97 %.        General: awake, alert, appropriate,  getting neb treatment- resp therapy in room; NAD HENT: conjugate gaze; oropharynx moist CV: regular rate; no JVD Pulmonary: CTA B/L; no W/R/R- good air movement GI: soft, NT, ND, (+)BS Psychiatric: appropriate Neurological: alert Extremities: No clubbing, cyanosis, or edema Skin: No evidence of breakdown, no evidence of rash Neurologic: Cranial nerves II through XII intact, motor strength is 5/5 in bilateral deltoid, bicep, tricep, grip, hip flexor, knee extensors, ankle dorsiflexor and plantar flexor Sensory exam normal sensation to light touch and proprioception in bilateral upper and lower extremities Cerebellar exam normal finger to nose to finger as well as heel to shin in bilateral upper and lower extremities Musculoskeletal: Full range of motion in all 4 extremities. No joint swelling     Assessment/Plan: 1. Functional deficits which require 3+ hours per day of  interdisciplinary therapy in a comprehensive inpatient rehab setting. Physiatrist is providing close team supervision and 24 hour management of active medical problems listed below. Physiatrist and rehab team continue to assess barriers to discharge/monitor patient progress toward functional and medical goals  Care Tool:  Bathing    Body parts bathed by patient: Right arm, Left arm, Chest, Abdomen, Front perineal area, Left upper leg, Right upper leg, Face, Buttocks, Right lower leg, Left lower leg   Body parts bathed by helper: Buttocks, Right lower leg, Left lower leg     Bathing assist Assist Level: Contact Guard/Touching assist     Upper Body Dressing/Undressing Upper body dressing   What is the patient wearing?: Pull over shirt    Upper body assist Assist Level: Supervision/Verbal cueing    Lower Body Dressing/Undressing Lower body dressing      What is the patient wearing?: Pants, Underwear/pull up     Lower body assist Assist for lower body dressing: Contact Guard/Touching assist     Toileting Toileting    Toileting assist Assist for toileting: Supervision/Verbal cueing     Transfers Chair/bed transfer  Transfers assist     Chair/bed transfer assist  level: Contact Guard/Touching assist     Locomotion Ambulation   Ambulation assist      Assist level: Supervision/Verbal cueing Assistive device: Rollator Max distance: 250   Walk 10 feet activity   Assist     Assist level: Supervision/Verbal cueing Assistive device: Rollator   Walk 50 feet activity   Assist    Assist level: Supervision/Verbal cueing Assistive device: Rollator    Walk 150 feet activity   Assist    Assist level: Supervision/Verbal cueing Assistive device: Rollator    Walk 10 feet on uneven surface  activity   Assist     Assist level: Contact Guard/Touching assist Assistive device: Walker-rolling   Wheelchair     Assist Is the patient using a  wheelchair?: Yes Type of Wheelchair: Manual    Wheelchair assist level: Dependent - Patient 0% (transport only this session)      Wheelchair 50 feet with 2 turns activity    Assist        Assist Level: Dependent - Patient 0%   Wheelchair 150 feet activity     Assist      Assist Level: Dependent - Patient 0%   Blood pressure (!) 140/54, pulse 78, temperature 98.3 F (36.8 C), temperature source Oral, resp. rate 16, height '5\' 4"'$  (1.626 m), weight 59.8 kg, SpO2 97 %.  Medical Problem List and Plan: 1. 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  EEG negative.  Continue Keppra 750 mg twice daily and Dilantin 300 mg nightly             -patient may shower             -ELOS/Goals: 10-14 days, Mod I PT/OT, I SLP goals  Con't CIR- first day of evaluations- PT, OT and SLP- pt doesn't want to stay longer than ~ 10 days, which if possible, I agree with, so she can start Cancer treatment.   11/1- d/c Friday-   Con't CIR- PT, OT and SLP 2.  Antithrombotics: -DVT/anticoagulation:  Pharmaceutical: Lovenox             -antiplatelet therapy: N/A   3. Pain Management: Oxycodone as needed  10/27- denies pain- con't meds prn 4. Mood/Behavior/Sleep: Provide emotional support             -antipsychotic agents: N/A 5. Neuropsych/cognition: This patient is not capable of making decisions on her own behalf. 6. Skin/Wound Care: Routine skin checks 7. Fluids/Electrolytes/Nutrition (hypokalemia, hypomagnasemia): Routine in and outs with follow-up chemistries           - Receiving intermittent IV K and Mag repletion; check admission labs and then would start on standing supplementation    10/27- pt's Mg is 1.2 and K_ 3.4- will replete with 4 G IV Mg and 20 mEq x2 of K+ and recheck in AM 10/28 K+ still low increase KCL to 44mq BID recheck K+ in am   10/30- last K_ 3.7 and Mg was 1.9- doing better  10/31- labs in AM  11/2- Mg 1.5- increased  PO meds- - K+ 4.5 8.  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  Status post ERCP with stenting 03/05/2022 per Dr. HBenson [place]  LFTs improving with latest total bilirubin 0.7.              - [place]   10/27- pt would like to get out of rehab "as soon as possible"- doesn't want to be here more  than ~ 10 days  11/1- will check CMP in AM 9.  AKI.   Follow-up chemistries      Latest Ref Rng & Units 03/30/2022    5:59 AM 03/29/2022    6:45 AM 03/26/2022    6:53 AM  BMP  Glucose 70 - 99 mg/dL 111  110    BUN 8 - 23 mg/dL 22  16    Creatinine 0.44 - 1.00 mg/dL 1.02  1.03    Sodium 135 - 145 mmol/L 140  140    Potassium 3.5 - 5.1 mmol/L 4.5  4.0  3.7   Chloride 98 - 111 mmol/L 107  103    CO2 22 - 32 mmol/L 25  24    Calcium 8.9 - 10.3 mg/dL 8.9  9.0     10/30- Last Cr 1.08- just a tiny bit elevated- will recheck later in week  11/1- labs in AM  11/2- Cr stable at 1.02 and BUN 22 10.  Constipation.  MiraLAX daily as well as Senokot-S tablet twice daily  10/31- LBM last night  11/2- going daily.    LOS: 7 days A FACE TO FACE EVALUATION WAS PERFORMED  Navy Belay 03/30/2022, 9:58 AM

## 2022-03-31 ENCOUNTER — Other Ambulatory Visit (HOSPITAL_COMMUNITY): Payer: Self-pay

## 2022-03-31 NOTE — Progress Notes (Signed)
Occupational Therapy Discharge Summary  Patient Details  Name: Shirley Nichols MRN: [id number] Date of Birth: [birth year]  Date of Discharge from [place] service:March 30, 2022  Today's Date: 03/31/2022 OT Individual Time:1000-1100 1st Session, 1400-1530 2nd Session 60 min, 75 min      Patient has met 13  out of 13 long term goals due to improved activity tolerance, improved balance, postural control, ability to compensate for deficits, improved attention, improved awareness, and improved coordination.  Patient to discharge at overall Supervision level.  Patient's care partner is independent to provide the necessary physical and cognitive assistance at discharge.    Reasons goals not met: n/a   Recommendation:  Patient will not require follow up OT at this time as husband has been trained in all HEP and carryover.   Equipment: Rollator, has shower seat   Reasons for discharge: treatment goals met  Patient/family agrees with progress made and goals achieved: Yes  OT Discharge Precautions/Restrictions  Precautions Precautions: Fall Restrictions Weight Bearing Restrictions: No    Vital Signs Therapy Vitals Temp: 98.7 F (37.1 C) Temp Source: Oral Pulse Rate: 71 Resp: 20 BP: (!) 146/63 Patient Position (if appropriate): Lying Oxygen Therapy SpO2: 96 % O2 Device: Room Air Pain Pain Assessment Pain Scale: 0-10 Pain Score: 0-No pain ADL ADL Equipment Provided: Long-handled sponge Eating: Independent Where Assessed-Eating: Chair Grooming: Independent Where Assessed-Grooming: Sitting at sink Upper Body Bathing: Independent Where Assessed-Upper Body Bathing: Sitting at sink Lower Body Bathing: Supervision/safety Where Assessed-Lower Body Bathing: Shower Upper Body Dressing: Independent Where Assessed-Upper Body Dressing: Sitting at sink Lower Body Dressing: Independent Where Assessed-Lower Body Dressing: Standing at sink, Sitting at sink Toileting: Modified  independent Where Assessed-Toileting: Glass blower/designer: Distant supervision Armed forces technical officer Method: Counselling psychologist: Energy manager: Not assessed Insurance underwriter: Civil engineer, contracting with back Social research officer, government: Close supervision Social research officer, government Method: Heritage manager: Civil engineer, contracting with back ADL Comments: Pt relatively mod I for all ADL's except stall shower access to bench with lip. husband trained to provide [employer] Baseline Vision/History: 1 Wears glasses Patient Visual Report: No change from baseline Perception  Perception: Within Functional Limits Praxis Praxis: Intact Cognition Cognition Overall Cognitive Status: Impaired/Different from baseline Arousal/Alertness: Awake/alert Orientation Level: Person;Place;Situation Memory: Impaired Memory Impairment: Storage deficit;Retrieval deficit;Decreased recall of new information Attention: Sustained Focused Attention: Appears intact Awareness: Impaired Awareness Impairment: Emergent impairment;Anticipatory impairment Problem Solving: Impaired Problem Solving Impairment: Verbal basic;Functional basic Safety/Judgment: Appears intact Comments: overall slower processing, delayed anticipatory reactions, decreased STM, safety awareness and organziation Brief Interview for Mental Status (BIMS) Repetition of Three Words (First Attempt): 3 Temporal Orientation: Year: Correct Temporal Orientation: Month: Accurate within 5 days Temporal Orientation: Day: Correct Recall: "Sock": Yes, no cue required Recall: "Blue": Yes, no cue required Recall: "Bed": Yes, no cue required BIMS Summary Score: 15 Sensation Sensation Light Touch: Appears Intact Proprioception: Appears Intact Coordination Gross Motor Movements are Fluid and Coordinated: Yes Fine Motor Movements are Fluid and Coordinated: Yes Coordination and Movement Description: demonstrates improvements in  coordination/balance with gait Finger Nose Finger Test: WFL's Heel Shin Test: WFL's 9 Hole Peg Test: [facility] Motor  Motor Motor: Within Functional Limits Motor - Discharge Observations: grossly coordination with transfers and gait with rollator Mobility  Bed Mobility Bed Mobility: Rolling Right;Rolling Left;Supine to Sit;Sit to Supine Rolling Right: Independent Rolling Left: Independent Supine to Sit: Independent Sit to Supine: Independent Transfers Sit to Stand: Independent with assistive device Stand to Sit: Independent with assistive device  Trunk/Postural Assessment  Cervical Assessment Cervical Assessment: Within Functional Limits Thoracic Assessment Thoracic Assessment: Within Functional Limits Lumbar Assessment Lumbar Assessment: Within Functional Limits Postural Control Postural Control: Within Functional Limits  Balance Balance Balance Assessed: Yes Static Sitting Balance Static Sitting - Balance Support: Feet supported Static Sitting - Level of Assistance: 7: Independent Dynamic Sitting Balance Dynamic Sitting - Balance Support: Feet supported;Feet unsupported;During functional activity Dynamic Sitting - Level of Assistance: 6: Modified independent (Device/Increase time) Static Standing Balance Static Standing - Balance Support: During functional activity;Bilateral upper extremity supported Static Standing - Level of Assistance: 6: Modified independent (Device/Increase time) Dynamic Standing Balance Dynamic Standing - Balance Support: During functional activity;Bilateral upper extremity supported Dynamic Standing - Level of Assistance: 6: Modified independent (Device/Increase time) Extremity/Trunk Assessment RUE Assessment RUE Assessment: Within Functional Limits LUE Assessment LUE Assessment: Within Functional Limits  1st Session:  Pt seen for 1st part of OT discharge reassessment, training and family educ completion. Focus on full AM self care routine and  shower training with emphasis on activity tolerance, energy conservation and dynamic balance. Safety and sequencing for ADL set up with rollator integration conducted as well. Pt relatively mod I with BADL's except S for shower safety due to higher risk slip and fall from wet surfaces. Pt continues to demonstrate roughly 5-6 min standing tolerance and seated rests between standing activity. Husband feels comfortable with shower carryover, has purchased 2 suction grab bars and has shower seat and HH shower hose in place. Pt left at end of session seated in arm chair with safety needs, call button and husband bedside. No pain whatsoever.   2nd Session:   OT conducted final reassessment and training session with husband present for all parts of therapy. Pt able to amb from bed level to ortho gym and back with S for cog/balance/standing tolerance activity with BITS for scanning and simple visual memory activity. Stood 6 intervals for tasks up to 6 min each. Overall scores 80-85 percentiles for tasks. Pt then able to access demo apt space to complete sofa and bed access with husband present and dist S. Stood then 2 intervals of 5 min on Airex foam mat for laundry task and hanging clothing in simulated closet with 6-8" functional reach unilaterally and S. Once back in room, pt remained OOB in arm chair with husband present, safety needs in place and no pain reported. No further skilled OT needs at CIR and pt will d/c tomorrow with UE HEP for carryover in the home.    Barnabas Lister 03/31/2022, 7:12 AM

## 2022-03-31 NOTE — Plan of Care (Signed)
  Problem: RH Balance Goal: LTG Patient will maintain dynamic standing with ADLs (OT) Description: LTG:  Patient will maintain dynamic standing balance with assist during activities of daily living (OT)  Outcome: Completed/Met   Problem: Sit to Stand Goal: LTG:  Patient will perform sit to stand in prep for activites of daily living with assistance level (OT) Description: LTG:  Patient will perform sit to stand in prep for activites of daily living with assistance level (OT) Outcome: Completed/Met   Problem: RH Grooming Goal: LTG Patient will perform grooming w/assist,cues/equip (OT) Description: LTG: Patient will perform grooming with assist, with/without cues using equipment (OT) Outcome: Completed/Met   Problem: RH Bathing Goal: LTG Patient will bathe all body parts with assist levels (OT) Description: LTG: Patient will bathe all body parts with assist levels (OT) Outcome: Completed/Met   Problem: RH Dressing Goal: LTG Patient will perform upper body dressing (OT) Description: LTG Patient will perform upper body dressing with assist, with/without cues (OT). Outcome: Completed/Met Goal: LTG Patient will perform lower body dressing w/assist (OT) Description: LTG: Patient will perform lower body dressing with assist, with/without cues in positioning using equipment (OT) Outcome: Completed/Met   Problem: RH Toileting Goal: LTG Patient will perform toileting task (3/3 steps) with assistance level (OT) Description: LTG: Patient will perform toileting task (3/3 steps) with assistance level (OT)  Outcome: Completed/Met   Problem: RH Simple Meal Prep Goal: LTG Patient will perform simple meal prep w/assist (OT) Description: LTG: Patient will perform simple meal prep with assistance, with/without cues (OT). Outcome: Completed/Met   Problem: RH Laundry Goal: LTG Patient will perform laundry w/assist, cues (OT) Description: LTG: Patient will perform laundry with assistance,  with/without cues (OT). Outcome: Completed/Met   Problem: RH Light Housekeeping Goal: LTG Patient will perform light housekeeping w/assist (OT) Description: LTG: Patient will perform light housekeeping with assistance, with/without cues (OT). Outcome: Completed/Met   Problem: RH Toilet Transfers Goal: LTG Patient will perform toilet transfers w/assist (OT) Description: LTG: Patient will perform toilet transfers with assist, with/without cues using equipment (OT) Outcome: Completed/Met   Problem: RH Tub/Shower Transfers Goal: LTG Patient will perform tub/shower transfers w/assist (OT) Description: LTG: Patient will perform tub/shower transfers with assist, with/without cues using equipment (OT) Outcome: Completed/Met   Problem: RH Memory Goal: LTG Patient will demonstrate ability for day to day recall/carry over during activities of daily living with assistance level (OT) Description: LTG:  Patient will demonstrate ability for day to day recall/carry over during activities of daily living with assistance level (OT). Outcome: Completed/Met

## 2022-04-04 ENCOUNTER — Other Ambulatory Visit: Payer: Self-pay | Admitting: Surgery

## 2022-04-04 ENCOUNTER — Telehealth: Payer: Self-pay

## 2022-04-04 DIAGNOSIS — C259 Malignant neoplasm of pancreas, unspecified: Secondary | ICD-10-CM

## 2022-04-04 NOTE — Telephone Encounter (Signed)
Received notification that Shirley Nichols was discharged on 03/31/22. Her spouse has been contacted and medical oncology appointment arranged.

## 2022-04-05 ENCOUNTER — Ambulatory Visit
Admission: RE | Admit: 2022-04-05 | Discharge: 2022-04-05 | Disposition: A | Discharge status: Home / Self Care | Payer: PPO | Source: Ambulatory Visit | Attending: Surgery | Admitting: Surgery

## 2022-04-05 DIAGNOSIS — C259 Malignant neoplasm of pancreas, unspecified: Secondary | ICD-10-CM | POA: Diagnosis present

## 2022-04-05 MED ORDER — IOHEXOL 300 MG/ML  SOLN
80.0000 mL | Freq: Once | INTRAMUSCULAR | Status: AC | PRN
  Administered 2022-04-05: 80 mL via INTRAVENOUS

## 2022-04-06 ENCOUNTER — Telehealth: Payer: Self-pay | Admitting: *Deleted

## 2022-04-06 ENCOUNTER — Other Ambulatory Visit (HOSPITAL_COMMUNITY): Payer: Self-pay

## 2022-04-06 MED ORDER — PHENYTOIN SODIUM EXTENDED 300 MG PO CAPS
300.0000 mg | ORAL_CAPSULE | Freq: Every day | ORAL | 3 refills | Status: DC
  Filled 2022-04-06 – 2022-04-10 (×2): qty 90, 90d supply, fill #0

## 2022-04-06 MED ORDER — LEVETIRACETAM 750 MG PO TABS
750.0000 mg | ORAL_TABLET | Freq: Two times a day (BID) | ORAL | 3 refills | Status: DC
  Filled 2022-04-06 – 2022-04-10 (×2): qty 180, 90d supply, fill #0
  Filled 2022-04-25: qty 60, 30d supply, fill #0

## 2022-04-06 MED ORDER — FLUVASTATIN SODIUM ER 80 MG PO TB24
80.0000 mg | ORAL_TABLET | Freq: Every day | ORAL | 3 refills | Status: DC
  Filled 2022-04-06 – 2022-04-10 (×2): qty 90, 90d supply, fill #0

## 2022-04-06 MED ORDER — EZETIMIBE 10 MG PO TABS
10.0000 mg | ORAL_TABLET | Freq: Every evening | ORAL | 3 refills | Status: DC
  Filled 2022-04-06 – 2022-04-25 (×3): qty 90, 90d supply, fill #0

## 2022-04-06 NOTE — Telephone Encounter (Signed)
Nurse placed call to patient to review appointment details for upcoming new oncology consultation visit. Nurse spoke with patient, patients husband and daughter Shirley Nichols to review details regarding upcoming appointment tomorrow. Patient and family deny any questions or concerns regarding new oncology visit. Nurse advised that we offer valet parking and wheelchairs at front entrance, also advised that only 1 support person can be present for visit. Patient would like her husband and daughter Shirley Nichols involved in visit, explained that we can use Ipad or phone to connect with daughter during visit. Patients family agreeable to this plan.

## 2022-04-07 ENCOUNTER — Inpatient Hospital Stay: Payer: PPO

## 2022-04-07 ENCOUNTER — Encounter: Payer: Self-pay | Admitting: Internal Medicine

## 2022-04-07 ENCOUNTER — Inpatient Hospital Stay: Payer: PPO | Attending: Internal Medicine | Admitting: Internal Medicine

## 2022-04-07 ENCOUNTER — Ambulatory Visit: Payer: PRIVATE HEALTH INSURANCE | Admitting: Internal Medicine

## 2022-04-07 ENCOUNTER — Other Ambulatory Visit: Payer: PRIVATE HEALTH INSURANCE

## 2022-04-07 VITALS — BP 126/56 | HR 76 | Temp 98.7°F | Resp 19 | Wt 123.3 lb

## 2022-04-07 DIAGNOSIS — R110 Nausea: Secondary | ICD-10-CM | POA: Diagnosis not present

## 2022-04-07 DIAGNOSIS — R7401 Elevation of levels of liver transaminase levels: Secondary | ICD-10-CM | POA: Insufficient documentation

## 2022-04-07 DIAGNOSIS — R531 Weakness: Secondary | ICD-10-CM | POA: Insufficient documentation

## 2022-04-07 DIAGNOSIS — C250 Malignant neoplasm of head of pancreas: Secondary | ICD-10-CM | POA: Diagnosis not present

## 2022-04-07 DIAGNOSIS — D649 Anemia, unspecified: Secondary | ICD-10-CM

## 2022-04-07 DIAGNOSIS — Z79899 Other long term (current) drug therapy: Secondary | ICD-10-CM | POA: Insufficient documentation

## 2022-04-07 DIAGNOSIS — Z66 Do not resuscitate: Secondary | ICD-10-CM | POA: Insufficient documentation

## 2022-04-07 DIAGNOSIS — D638 Anemia in other chronic diseases classified elsewhere: Secondary | ICD-10-CM | POA: Insufficient documentation

## 2022-04-07 NOTE — Progress Notes (Signed)
Shirley Nichols CONSULT NOTE  Patient Care Team: Remi Haggard, FNP as PCP - General (Family Medicine) Clent Jacks, RN as Oncology Nurse Navigator  REFERRING PROVIDER: Dr. Alvy Bimler  REASON FOR REFFERAL: pancreatic adenocarcinoma  CANCER STAGING   Cancer Staging  Pancreatic cancer [facility]) Staging form: Exocrine Pancreas, AJCC 8th Edition - Clinical stage from 03/09/2022: Stage IB (cT2, cN0, cM0) - Signed by Heath Lark, MD on 03/09/2022 Stage prefix: Initial diagnosis Total positive nodes: 0   ASSESSMENT & PLAN:  Shirley Nichols 81 y.o. female with pmh of hypertension and hyperlipidemia was seen for localized pancreatic cancer.  #Localized pancreatic cancer - MRCP on 03/03/2022 showed a 3.8 cm pancreatic head mass and biliary dilatation.  She had ERCP with dilatation of upper and mid third main bile duct with obstructing mass and 1 metal stent was placed.  Brushings were positive for adenocarcinoma.  -CT chest negative for metastatic disease. -She had CT pancreatic protocol 04/05/22 by Dr. Zenia Resides of hepatobiliary surgery to decide if she will be a surgical candidate upfront.  Currently her ECOG status is 3.  She is spending more than 50% of time in the bed or reclining chair.  She is not a candidate for aggressive chemotherapy regimen such as FOLFIRINOX.  I will consider single agent gemcitabine dose reduced and see how she tolerates.  If surgery is not an option, would also consider adding radiation.  Labs reviewed and AST/ALT, bilirubin has normalized.  I also discussed about focusing on symptoms and comfort and not to proceed with any active treatment.  Patient was discharged from the hospital 1 week ago.  Patient and family thinks that she has showed good improvement in her strength in past 1 week.  I will see patient again in 2 weeks to assess for any functional improvement and decide on treatment plan.  -Referral to nutrition and palliative care. -Referral to [facility]  care exercise  Program. -I will repeat lab next visit.  #Anemia - likely form underlying cancer - will do iron panel, b12, folate next visit  Orders Placed This Encounter  Procedures   CBC with Differential    Standing Status:   Future    Standing Expiration Date:   04/07/2023   Comprehensive metabolic panel    Standing Status:   Future    Standing Expiration Date:   04/07/2023   Cancer antigen 19-9    Standing Status:   Future    Standing Expiration Date:   04/07/2023   Iron and TIBC([facility])   Ferritin    Standing Status:   Future    Standing Expiration Date:   04/08/2023   Vitamin B12    Standing Status:   Future    Standing Expiration Date:   04/08/2023   Folate    Standing Status:   Future    Standing Expiration Date:   04/08/2023   Ambulatory Referral to [facility] Nutrition    Referral Priority:   Routine    Referral Type:   Consultation    Referral Reason:   Specialty Services Required    Number of Visits Requested:   1   Ambulatory Referral to Palliative Care    Referral Priority:   Routine    Referral Type:   Consultation    Number of Visits Requested:   1   AMB REFERRAL TO [facility] CARE    Referral Priority:   Routine    Referral Type:   Consultation    Number of Visits Requested:  1    The total time spent in the appointment was 60 minutes encounter with patients including review of chart and various tests results, discussions about plan of care and coordination of care plan   All questions were answered. The patient knows to call the clinic with any problems, questions or concerns. No barriers to learning was detected.  Shirley Canary, MD 11/10/20236:52 PM   HISTORY OF PRESENTING ILLNESS:  Shirley Nichols 81 y.o. female with pmh of hypertension and hyperlipidemia was seen for localized pancreatic cancer.  Patient was sent to ED by PCP in October 2023 for elevated liver enzymes and concern for biliary obstruction. She had MRCP on 03/03/2022 which showed  a 3.8 cm pancreatic head mass and biliary dilatation.  She had ERCP with dilatation of upper and mid third main bile duct with obstructing mass and 1 metal stent was placed.  Brushings were positive for adenocarcinoma.  Was complicated by post ERCP pancreatitis.  Her hospital course was prolonged by acute respiratory failure which required intubation.  There was suspicion for seizure and was started on Keppra and Dilantin by neurology.  MRI brain and LP was negative for malignancy.  CT chest was negative for metastatic disease.  She also had acute renal failure which improved with fluids.  Prior to hospitalization in October, patient was functional and very independent with her ADLs.  She was driving and doing things.  She has noticed a significant decline in her functional status.  Currently her ECOG is 3.  She spends most of her time lying or sleeping.  Her appetite is fair.  She has lost about 15 pounds in the past few weeks.  I have reviewed her chart and materials related to her cancer extensively and collaborated history with the patient. Summary of oncologic history is as follows: Oncology History  Pancreatic cancer ([place])  03/03/2022 Initial Diagnosis   Pancreatic cancer ([place])   03/03/2022 Imaging   CT abdomen and pelvis  Hazy stranding about the pancreatic head suggestive of pancreatitis. This is incompletely evaluated without IV contrast. Dilated gallbladder and common bile duct. MRI/MRCP is recommended to evaluate for choledocholithiasis or obstructing mass.   03/03/2022 Imaging   MRI  Image degradation by motion artifact noted.   3.4 cm masslike density in the pancreatic head, highly suspicious for pancreatic carcinoma.    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 No evidence of choledocholithiasis.   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   03/05/2022 Pathology Results    FINAL MICROSCOPIC DIAGNOSIS:  - Malignant cells consistent with adenocarcinoma    03/05/2022 Procedure   ERCP - The major papilla appeared normal. - The upper third of the main bile duct and middle third of the main bile duct were dilated, with a mass causing an obstruction. - A biliary sphincterotomy was performed. - One covered metal stent was placed into the common bile duct. - Cells for cytology obtained in the lower third of the main duct.   03/09/2022 Cancer Staging   Staging form: Exocrine Pancreas, AJCC 8th Edition - Clinical stage from 03/09/2022: Stage IB (cT2, cN0, cM0) - Signed by Heath Lark, MD on 03/09/2022 Stage prefix: Initial diagnosis Total positive nodes: 0     MEDICAL HISTORY:  Past Medical History:  Diagnosis Date   Cancer ([place])    skin cancer   Hyperlipidemia    Hypertension     SURGICAL HISTORY:  Past Surgical History:  Procedure Laterality Date   ABDOMINAL HYSTERECTOMY     BILIARY BRUSHING  03/05/2022   Procedure: BILIARY BRUSHING;  Surgeon: Carol Ada, MD;  Location: [facility] ENDOSCOPY;  Service: Gastroenterology;;   BILIARY STENT PLACEMENT  03/05/2022   Procedure: BILIARY STENT PLACEMENT;  Surgeon: Carol Ada, MD;  Location[facility] ENDOSCOPY;  Service: Gastroenterology;;   COLONOSCOPY     02/21/2002 adenomatous polyp, 03/23/2011   COLONOSCOPY W/ POLYPECTOMY  02/2000   TA polyp removed from ascending colon.  performed through [facility] (? affiliate in [place])   COLONOSCOPY WITH PROPOFOL N/A 11/03/2016   Procedure: COLONOSCOPY WITH PROPOFOL;  Surgeon: Manya Silvas, MD;  Location: [facility] ENDOSCOPY;  Service: Endoscopy;  Laterality: N/A;   ERCP N/A 03/05/2022   Procedure: ENDOSCOPIC RETROGRADE CHOLANGIOPANCREATOGRAPHY (ERCP);  Surgeon: Carol Ada, MD;  Location: [place];  Service: Gastroenterology;  Laterality: N/A;   HEMORROIDECTOMY     TONSILLECTOMY      SOCIAL HISTORY: Social History   Socioeconomic History   Marital status: Married     Spouse name: Not on file   Number of children: Not on file   Years of education: Not on file   Highest education level: Not on file  Occupational History   Not on file  Tobacco Use   Smoking status: Former   Smokeless tobacco: Never  Substance and Sexual Activity   Alcohol use: No   Drug use: No   Sexual activity: Yes  Other Topics Concern   Not on file  Social History Narrative   Not on file   Social Determinants of Health   Financial Resource Strain: Not on file  Food Insecurity: No Food Insecurity (03/03/2022)   Hunger Vital Sign    Worried About Running Out of Food in the Last Year: Never true    Ran Out of Food in the Last Year: Never true  Transportation Needs: No Transportation Needs (03/03/2022)   PRAPARE - Hydrologist (Medical): No    Lack of Transportation (Non-Medical): No  Physical Activity: Not on file  Stress: Not on file  Social Connections: Not on file  Intimate Partner Violence: Not At Risk (03/03/2022)   Humiliation, Afraid, Rape, and Kick questionnaire    Fear of Current or Ex-Partner: No    Emotionally Abused: No    Physically Abused: No    Sexually Abused: No    FAMILY HISTORY: Family History  Problem Relation Age of Onset   Breast cancer Neg Hx     ALLERGIES:  has No Known Allergies.  MEDICATIONS:  Current Outpatient Medications  Medication Sig Dispense Refill   acetaminophen (TYLENOL) 325 MG tablet Take 2 tablets (650 mg total) by mouth every 6 (six) hours as needed for mild pain, fever or headache.     aspirin EC 81 MG tablet Take 81 mg by mouth daily.     ezetimibe (ZETIA) 10 MG tablet Take 1 tablet (10 mg total) by mouth every evening. 90 tablet 3   fluvastatin XL (LESCOL XL) 80 MG 24 hr tablet Take 1 tablet (80 mg total) by mouth daily. 30 tablet 0   fluvastatin XL (LESCOL XL) 80 MG 24 hr tablet Take 1 tablet (80 mg total) by mouth daily. 90 tablet 3   levETIRAcetam (KEPPRA) 750 MG tablet Take 1 tablet (750  mg total) by mouth 2 (two) times daily. 180 tablet 3   magnesium oxide (MAG-OX) 400 MG tablet Take 2 tablets (800 mg total) by mouth  2 (two) times daily. 120 tablet 0   oxyCODONE (OXY IR/ROXICODONE) 5 MG immediate release tablet Take 1/2 tablet (2.5 mg total) by mouth every 8 (eight) hours as needed for moderate pain or severe pain. 10 tablet 0   phenytoin (DILANTIN) 300 MG ER capsule Take 1 capsule (300 mg total) by mouth at bedtime 90 capsule 3   polyethylene glycol (MIRALAX / GLYCOLAX) 17 g packet Take 17 g by mouth daily. 14 each 0   senna-docusate (SENOKOT-S) 8.6-50 MG tablet Take 1 tablet by mouth 2 (two) times daily.     SYMBICORT 160-4.5 MCG/ACT inhaler Inhale 2 puffs into the lungs as needed (for wheezing or shortness of breath). 10.2 g 4   No current facility-administered medications for this visit.    REVIEW OF SYSTEMS:   Pertinent information mentioned in HPI All other systems were reviewed with the patient and are negative.  PHYSICAL EXAMINATION: ECOG PERFORMANCE STATUS: 3 - Symptomatic, >50% confined to bed  Vitals:   04/07/22 1127  BP: (!) 126/56  Pulse: 76  Resp: 19  Temp: 98.7 F (37.1 C)  SpO2: 97%   Filed Weights   04/07/22 1127  Weight: 123 lb 4.8 oz (55.9 kg)    GENERAL:alert, no distress and comfortable SKIN: skin color, texture, turgor are normal, no rashes or significant lesions EYES: normal, conjunctiva are pink and non-injected, sclera clear OROPHARYNX:no exudate, no erythema and lips, buccal mucosa, and tongue normal  NECK: supple, thyroid normal size, non-tender, without nodularity LYMPH:  no palpable lymphadenopathy in the cervical, axillary or inguinal LUNGS: clear to auscultation and percussion with normal breathing effort HEART: regular rate & rhythm and no murmurs and no lower extremity edema ABDOMEN:abdomen soft, non-tender and normal bowel sounds Musculoskeletal:no cyanosis of digits and no clubbing  PSYCH: alert & oriented x 3 with fluent  speech NEURO: no focal motor/sensory deficits  LABORATORY DATA:  I have reviewed the data as listed Lab Results  Component Value Date   WBC 6.7 03/30/2022   HGB 8.1 (L) 03/30/2022   HCT 25.4 (L) 03/30/2022   MCV 83.0 03/30/2022   PLT 320 03/30/2022   Recent Labs    03/14/22 1430 03/14/22 1715 03/24/22 0548 03/25/22 0913 03/26/22 0653 03/29/22 0645 03/30/22 0559  NA  --    < > 137 136  --  140 140  K  --    < > 3.4* 3.2* 3.7 4.0 4.5  CL  --    < > 102 98  --  103 107  CO2  --    < > 25 27  --  24 25  GLUCOSE  --    < > 100* 180*  --  110* 111*  BUN  --    < > 20 22  --  16 22  CREATININE  --    < > 0.99 1.08*  --  1.03* 1.02*  CALCIUM  --    < > 8.1* 8.6*  --  9.0 8.9  GFRNONAA  --    < > 57* 51*  --  54* 55*  PROT  --    < > 5.2*  --   --  5.7* 5.6*  ALBUMIN  --    < > 2.2*  --   --  2.6* 2.5*  AST  --    < > 31  --   --  26 21  ALT  --    < > 33  --   --  31 29  ALKPHOS  --    < > 193*  --   --  166* 158*  BILITOT  --    < > 0.7  --   --  0.5 0.5  BILIDIR 1.9*  --   --   --   --   --   --    < > = values in this interval not displayed.    RADIOGRAPHIC STUDIES: I have personally reviewed the radiological images as listed and agreed with the findings in the report. CT ABD PELVIS W/WO CM ONCOLOGY PANCREATIC PROTOCOL  Result Date: 04/07/2022 CLINICAL DATA:  Pancreatic head adenocarcinoma diagnosed on 03/05/2022 ERCP. Staging. * Tracking Code: BO * EXAM: CT ABDOMEN AND PELVIS WITHOUT AND WITH CONTRAST TECHNIQUE: Multidetector CT imaging of the abdomen and pelvis was performed following the standard protocol before and following the bolus administration of intravenous contrast. RADIATION DOSE REDUCTION: This exam was performed according to the departmental dose-optimization program which includes automated exposure control, adjustment of the mA and/or kV according to patient size and/or use of iterative reconstruction technique. CONTRAST:  24m OMNIPAQUE IOHEXOL 300 MG/ML   SOLN COMPARISON:  03/03/2022 MRI abdomen and unenhanced CT abdomen/pelvis FINDINGS: Lower chest: No significant pulmonary nodules or acute consolidative airspace disease. Coronary atherosclerosis. Hepatobiliary: Normal liver size. Subcapsular hypodense 0.8 cm far inferior right liver lesion (series 9/image 57), too small to characterize, stable from 03/03/2022 MRI abdomen study, where it was T2 hyperintense and compatible with a tiny benign cyst. No additional liver lesions. Metallic CBD stent in place extending from the mid CBD to the descending duodenal lumen. Pneumobilia throughout the intrahepatic bile ducts and nondistended gallbladder indicating stent patency. Proximal CBD diameter 7 mm. Mild central intrahepatic biliary ductal dilatation. Pancreas: Poorly defined hypoenhancing 2.9 x 2.4 cm pancreatic head mass (series 9/image 47), previously 2.9 x 2.3 cm on 03/03/2022 MRI using similar measurement technique, not appreciably changed. No clear tumor involvement of the SMV, portal veins or SMA. Dilated main pancreatic duct (6 mm diameter) with abrupt caliber transition at the level of the pancreatic head. Diffuse haziness of the peripancreatic fat without measurable peripancreatic fluid collections. No evidence of pancreatic parenchymal necrosis. Tiny 1.2 x 0.6 cm pancreatic body pseudocyst (series 9/image 44), slightly increased from 0.8 x 0.4 cm on 03/03/2022 MRI. Spleen: Normal size. No mass. Adrenals/Urinary Tract: Left adrenal 2.2 x 1.8 cm nodule with precontrast density 4 HU, compatible with an adenoma. Mildly irregular right adrenal gland is unchanged, without discrete right adrenal nodules. No hydronephrosis. Small simple bilateral renal cysts, largest 1.4 cm in the anterior upper right kidney. No suspicious renal masses. Normal bladder. Stomach/Bowel: Small hiatal hernia. Otherwise normal nondistended stomach. Normal caliber small bowel with no small bowel wall thickening. Appendix not discretely  visualized. Normal large bowel with no diverticulosis, large bowel wall thickening or pericolonic fat stranding. Vascular/Lymphatic: Atherosclerotic nonaneurysmal abdominal aorta. Patent portal, splenic, hepatic and renal veins. No pathologically enlarged lymph nodes in the abdomen or pelvis. Reproductive: Status post hysterectomy, with no abnormal findings at the vaginal cuff. No adnexal mass. Other: No pneumoperitoneum, ascites or focal fluid collection. Nonspecific subcutaneous 0.8 cm ventral left abdominal wall soft tissue density nodule (series 9/image 85), new since 03/03/2022 CT. Musculoskeletal: No aggressive appearing focal osseous lesions. Mild thoracolumbar spondylosis. IMPRESSION: 1. Poorly defined hypoenhancing 2.9 cm pancreatic head mass, not appreciably changed since 03/03/2022 MRI, compatible with known primary pancreatic adenocarcinoma. No evidence of vascular invasion by the pancreatic tumor. 2. Dilated main pancreatic duct with abrupt caliber  transition at the level of the pancreatic head. Well-positioned CBD stent with pneumobilia indicating stent patency. 3. No lymphadenopathy or other findings highly suspicious for metastatic disease in the abdomen or pelvis. Nonspecific subcutaneous 0.8 cm ventral left abdominal wall soft tissue density nodule, new since 03/03/2022 CT, most commonly due to subcutaneous injections. 4. Evidence of ongoing pancreatitis. Tiny 1.2 x 0.6 cm pancreatic body pseudocyst, slightly increased. 5. 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 6. Small hiatal hernia. 7. Coronary atherosclerosis. 8.  Aortic Atherosclerosis (ICD10-I70.0). Electronically Signed   By: Ilona Sorrel M.D.   On: 04/07/2022 16:08   Overnight EEG with video  Result Date: 03/16/2022 Lora Havens, MD     03/17/2022  6:39 AM Patient Name: Shirley Nichols MRN: [id number] Epilepsy Attending: Lora Havens Referring Physician/Provider: Katy Apo, NP  Duration: 03/15/2022 0920 to 03/16/2022 0920  Patient history: 81yo F with right leg tremoring after the MRI and they also noted that she was talking gibberish all afternoon for 4 to 5 hours. At approximately 1830, she was noted to have right arm and leg jerking with left gaze deviation. EEG to evaluate for seizure  Level of alertness: lethargic  AEDs during EEG study: LEV, PHT, Propofol  Technical aspects: This EEG study was done with scalp electrodes positioned according to the 10-20 International system of electrode placement. Electrical activity was reviewed with band pass filter of 1-'70Hz'$ , sensitivity of 7 uV/mm, display speed of 33m/sec with a '60Hz'$  notched filter applied as appropriate. EEG data were recorded continuously and digitally stored.  Video monitoring was available and reviewed as appropriate.  Description: EEG showed continuous generalized 3 to 6 Hz theta-delta slowing. Hyperventilation and photic stimulation were not performed.    Event button was pressed on 03/15/2022 at 1123, 1125, 1142, 1213,1218,1233, [id number] right lower extremity rhythmic twitching. Concomitant EEG before, during and after the event did not show any EEG change.  ABNORMALITY - Continuous slow, generalized  IMPRESSION: This study is suggestive of moderate to severe diffuse encephalopathy, nonspecific etiology. No epileptiform discharges were seen throughout the recording.  Event button was pressed on 03/15/2022 at 1123, 1125, 1142, [id number] [id number] right lower extremity rhythmic twitching without concomitant EEG change. However, focal motor seizures may not be seen on scalp EEG. Therefore clinical correlation is recommended.    PLora Havens  DG Abd Portable 1V  Result Date: 03/15/2022 CLINICAL DATA:  Evaluate location of tip of enteric tube EXAM: PORTABLE ABDOMEN - 1 VIEW COMPARISON:  Previous studies done earlier today FINDINGS: There is slight interval withdrawal of the enteric tube. Tip and side  port are still within the fundus of the stomach. Bowel gas pattern is nonspecific. Heart is enlarged in size. Bilateral pleural effusions are seen. IMPRESSION: Tip and side port of enteric tube are noted in the medial aspect of fundus of the stomach. Electronically Signed   By: PElmer PickerM.D.   On: 03/15/2022 14:30   DG Abd Portable 1V  Result Date: 03/15/2022 CLINICAL DATA:  Evaluate location of enteric tube EXAM: PORTABLE ABDOMEN - 1 VIEW COMPARISON:  Previous study done earlier today FINDINGS: There is interval repositioning of enteric tube with its tip in the fundus of the stomach. Biliary stent is seen in the course of distal common bile duct. Bowel gas pattern is nonspecific. IMPRESSION: Tip of enteric tube is seen in the fundus of the stomach. Electronically Signed   By: PPrudy FeelerD.  On: 03/15/2022 14:29   DG Abd Portable 1V  Result Date: 03/15/2022 CLINICAL DATA:  OG tube placement. EXAM: PORTABLE ABDOMEN - 1 VIEW COMPARISON:  One-view chest x-ray 03/14/2022 FINDINGS: Biliary stent is in place.  Bowel gas pattern is unremarkable. NG tube is coiled in the stomach. The tip is directed cephalad in the distal esophagus. The portion directed upward in the esophagus has increased since the previous exam. Bilateral pleural effusions and basilar airspace disease noted. IMPRESSION: 1. NG tube is coiled in the stomach. The tip is directed cephalad in the distal esophagus. 2. Biliary stent in place. Electronically Signed   By: San Morelle M.D.   On: 03/15/2022 08:47   DG CHEST PORT 1 VIEW  Result Date: 03/14/2022 CLINICAL DATA:  Endotracheal tube EXAM: PORTABLE CHEST 1 VIEW COMPARISON:  Earlier today at 2:02 p.m. FINDINGS: 4:45 p.m. Support apparatus: Endotracheal tube terminates 4.6 cm above carina. Nasogastric tube placed in the interval, looped in the stomach with tip at distal esophagus. Right PICC line tip at superior caval/atrial junction. Heart/mediastinum: Midline  trachea.  Moderate cardiomegaly Pleura: Small bilateral pleural effusions are similar. No pneumothorax. Lungs: Left-greater-than-right base airspace disease is not significantly changed.Moderate interstitial edema is increased. Other: Numerous leads and wires project over the chest. IMPRESSION: Appropriate position of endotracheal tube. Nasogastric tube looped in the stomach with tip at the distal esophagus. Similar congestive heart failure with layering bilateral pleural effusions and bibasilar airspace disease. These results will be called to the ordering clinician or representative by the Radiologist Assistant, and communication documented in the PACS or [employer]. Electronically Signed   By: Abigail Miyamoto M.D.   On: 03/14/2022 17:11   DG Abd Portable 1V  Result Date: 03/14/2022 CLINICAL DATA:  Abnormal liver function tests. Evaluate biliary stent EXAM: PORTABLE ABDOMEN - 1 VIEW COMPARISON:  CT chest done on 03/11/2022 FINDINGS: Bowel gas pattern is nonspecific. No abnormal masses or calcifications are seen. Increased density in left lower lung fields may suggest pleural effusion. There is a stent in right side of abdomen. The stent is vertically oriented, possibly positioned in the distal common bile duct and duodenum. IMPRESSION: Nonspecific bowel gas pattern. A biliary stent is noted in right side of abdomen. The stent is more vertically oriented than usual. This may suggest presence of stent in the distal common bile duct and duodenum. Electronically Signed   By: Elmer Picker M.D.   On: 03/14/2022 15:48   DG Chest Port 1 View  Result Date: 03/14/2022 CLINICAL DATA:  Pulmonary aspiration.  Seizure. EXAM: PORTABLE CHEST 1 VIEW COMPARISON:  CT chest 03/11/2022 and chest radiograph 03/08/2022 FINDINGS: Hazy opacities at the lung bases compatible with lower lobe atelectasis or pneumonia likely superimposed on pleural effusions which were better shown on the 03/11/2022 exam. Right PICC line  tip: SVC. Mild enlargement of the cardiopericardial silhouette. Indistinct pulmonary vasculature suggesting pulmonary venous hypertension. IMPRESSION: 1. Hazy opacities at the lung bases compatible with lower lobe atelectasis or pneumonia likely superimposed on pleural effusions. 2. Mild enlargement of the cardiopericardial silhouette with indistinct pulmonary vasculature suggesting pulmonary venous hypertension. Electronically Signed   By: Van Clines M.D.   On: 03/14/2022 14:31   DG FL GUIDED LUMBAR PUNCTURE  Result Date: 03/13/2022 CLINICAL DATA:  Acute encephalopathy with focal seizures. Radiology was consulted to perform diagnostic lumbar puncture under fluoroscopic guidance EXAM: DIAGNOSTIC LUMBAR PUNCTURE UNDER FLUOROSCOPIC GUIDANCE COMPARISON:  Head CT 03/10/2022 and abdominal CT 03/03/2022 were reviewed. FLUOROSCOPY: Radiation Exposure Index (as provided  by the fluoroscopic device): 14.40 mGy Kerma PROCEDURE: Informed consent was obtained from the patient prior to the procedure, including potential complications of headache, allergy, and pain. With the patient prone, the lower back was prepped with Betadine. 1% Lidocaine was used for local anesthesia. Lumbar puncture was performed at the L5-S1 level using a 20 gauge needle with return of clear CSF with an opening pressure of 36 cm water. 15 ml of CSF were obtained for laboratory studies. Closing pressure of 18 cm water. The patient tolerated the procedure well and there were no apparent complications. Procedure performed by Reatha Armour, PA under the supervision of Richardean Sale, MD. IMPRESSION: 1.  Technically successful lumbar puncture at L5-S1 level 2.  Opening pressure 36 cm water, closing pressure 18 cm water 3.  15 mL of clear CSF obtained for laboratory study 4.  Patient tolerated procedure well with no apparent complications Read by: Reatha Armour, PA-C Electronically Signed   By: Richardean Sale M.D.   On: 03/13/2022 15:17    Overnight EEG with video  Result Date: 03/13/2022 Lora Havens, MD     03/14/2022  9:20 AM Patient Name: Shirley Nichols MRN: [id number] Epilepsy Attending: Lora Havens Referring Physician/Provider: Katy Apo, NP Duration: 03/12/2022 1721 to 03/13/2022 1721  Patient history: 81yo F with right leg tremoring after the MRI and they also noted that she was talking gibberish all afternoon for 4 to 5 hours. At approximately 1830, she was noted to have right arm and leg jerking with left gaze deviation. EEG to evaluate for seizure  Level of alertness: Awake, asleep  AEDs during EEG study: LEV  Technical aspects: This EEG study was done with scalp electrodes positioned according to the 10-20 International system of electrode placement. Electrical activity was reviewed with band pass filter of 1-'70Hz'$ , sensitivity of 7 uV/mm, display speed of 23m/sec with a '60Hz'$  notched filter applied as appropriate. EEG data were recorded continuously and digitally stored.  Video monitoring was available and reviewed as appropriate.  Description: No clear posterior dominant rhythm was seen. Sleep was characterized by vertex waves, sleep spindles (12 to 14 Hz), maximal frontocentral region. EEG showed continuous generalized and lateralized left hemisphere 3 to 6 Hz theta-delta slowing with overriding 12-'15hz'$  generalized beta activity. Hyperventilation and photic stimulation were not performed.   Event button was pressed on 03/13/2022 at 0958 for right lower extremity rhythmic twitching.  Concomitant EEG before, during and after the event did not show any EEG change.  ABNORMALITY - Continuous slow, generalized and lateralized left hemisphere  IMPRESSION: This study is suggestive of cortical dysfunction arising from left hemisphere likely secondary to underlying structural abnormality, post-ictal state. Additionally there is moderate to severe diffuse encephalopathy, nonspecific etiology. No epileptiform  discharges were seen throughout the recording. Event button was pressed on 03/13/2022 at 0958 for rhythmic right lower extremity twitching without concomitant EEG change.  However, focal motor seizures may not be seen on scalp EEG.  Therefore clinical correlation is recommended.  Priyanka O Yadav   [place] SITE RITE  Result Date: 03/12/2022 If Site Rite image not attached, placement could not be confirmed due to current cardiac rhythm.  CT CHEST W CONTRAST  Result Date: 03/11/2022 CLINICAL DATA:  Pancreatic cancer. EXAM: CT CHEST WITH CONTRAST TECHNIQUE: Multidetector CT imaging of the chest was performed during intravenous contrast administration. RADIATION DOSE REDUCTION: This exam was performed according to the departmental dose-optimization program which includes automated exposure control, adjustment of the mA  and/or kV according to patient size and/or use of iterative reconstruction technique. CONTRAST:  44m OMNIPAQUE IOHEXOL 350 MG/ML SOLN COMPARISON:  None Available. FINDINGS: Cardiovascular: The heart size is normal. No substantial pericardial effusion. Mild atherosclerotic calcification is noted in the wall of the thoracic aorta. Mediastinum/Nodes: 2.1 cm right thyroid nodule evident. Small mediastinal and right hilar lymph nodes evident. The esophagus has normal imaging features. There is no axillary lymphadenopathy. Lungs/Pleura: Collapse/consolidative opacity is seen in both lower lobes with small to moderate bilateral pleural effusions. Mild interlobular septal thickening noted in the upper lobes bilaterally. No overtly suspicious pulmonary nodule or mass. Upper Abdomen: 1.8 cm hypervascular subcapsular lesion seen in the dome of the liver (image 107/3). This was not visible on MRI of 03/03/2022. Pneumobilia evident. Left adrenal nodule is similar to previous MRI. Biliary stent device is been incompletely visualized. Musculoskeletal: No worrisome lytic or sclerotic osseous abnormality.  IMPRESSION: 1. Collapse/consolidative opacity in both lower lobes with small to moderate bilateral pleural effusions. Imaging features are compatible with compressive atelectasis although superimposed pneumonia not excluded. 2. 1.8 cm hypervascular subcapsular lesion in the dome of the liver. This was not visible on MRI of 03/03/2022. This may be related to perfusion anomaly, but given the history of pancreatic cancer, close follow-up recommended. 3. 2.1 cm right thyroid nodule. In the setting of significant comorbidities or limited life expectancy, no follow-up recommended (ref: J Am Coll Radiol. 2015 Feb;12(2): 143-50). 4. Small mediastinal and right hilar lymph nodes, likely reactive. 5. Pneumobilia. Biliary stent device is been incompletely visualized. 6. Stable left adrenal nodule. This was incompletely characterized on previous MRI but felt favor a benign adrenal adenoma. 7.  Aortic Atherosclerosis (ICD10-I70.0). Electronically Signed   By: EMisty StanleyM.D.   On: 03/11/2022 15:25   Overnight EEG with video  Result Date: 03/11/2022 YLora Havens MD     03/11/2022 12:33 PM Patient Name: CLUANE ROCHONMRN: [id number] Attending: PLora HavensReferring Physician/Provider: dKaty Apo NP Duration: 03/10/2022 1920 to 03/11/2022 1030 Patient history: 89yoF with right leg tremoring after the MRI and they also noted that she was talking gibberish all afternoon for 4 to 5 hours. At approximately 1830, she was noted to have right arm and leg jerking with left gaze deviation. EEG to evaluate for seizure Level of alertness: Awake, asleep AEDs during EEG study: LEV Technical aspects: This EEG study was done with scalp electrodes positioned according to the 10-20 International system of electrode placement. Electrical activity was reviewed with band pass filter of 1-'70Hz'$ , sensitivity of 7 uV/mm, display speed of 349msec with a '60Hz'$  notched filter applied as appropriate. EEG data were  recorded continuously and digitally stored.  Video monitoring was available and reviewed as appropriate. Description: No clear posterior dominant rhythm was seen. Sleep was characterized by vertex waves, sleep spindles (12 to 14 Hz), maximal frontocentral region. EEG showed continuous generalized and lateralized left hemisphere 3 to 6 Hz theta-delta slowing with overriding 12-'15hz'$  generalized beta activity. Hyperventilation and photic stimulation were not performed.   ABNORMALITY - Continuous slow, generalized and lateralized left hemisphere IMPRESSION: This study is suggestive of cortical dysfunction arising from left hemisphere likely secondary to underlying structural abnormality, post-ictal state. Additionally there is moderate to severe diffuse encephalopathy, nonspecific etiology. No seizures or epileptiform discharges were seen throughout the recording. PrLora Havens CT HEAD CODE STROKE WO CONTRAST  Result Date: 03/10/2022 CLINICAL DATA:  Code stroke. EXAM: CT HEAD WITHOUT CONTRAST  TECHNIQUE: Contiguous axial images were obtained from the base of the skull through the vertex without intravenous contrast. RADIATION DOSE REDUCTION: This exam was performed according to the departmental dose-optimization program which includes automated exposure control, adjustment of the mA and/or kV according to patient size and/or use of iterative reconstruction technique. COMPARISON:  No prior head CT, correlation is made with same day MRI head FINDINGS: Brain: No evidence of acute infarction, hemorrhage, cerebral edema, mass, mass effect, or midline shift. No hydrocephalus or extra-axial collection. Cerebral volume is within normal limits for age. Vascular: No hyperdense vessel. Skull: Negative for fracture or focal lesion. Sinuses/Orbits: Mucous retention cyst in the left maxillary sinus. Mucosal thickening in the ethmoid air cells and left frontal sinus. Status post bilateral lens replacements. Other: The mastoid  air cells are well aerated. ASPECTS [facility] Stroke Program Early CT Score) - Ganglionic level infarction (caudate, lentiform nuclei, internal capsule, insula, M1-M3 cortex): 7 - Supraganglionic infarction (M4-M6 cortex): 3 Total score (0-10 with 10 being normal): 10 IMPRESSION: 1. No acute intracranial process. 2. ASPECTS is 10. Code stroke imaging results were communicated on 03/10/2022 at 6:57 pm to provider BHAGAT via secure text paging. Electronically Signed   By: Merilyn Baba M.D.   On: 03/10/2022 18:57   MR BRAIN W WO CONTRAST  Result Date: 03/10/2022 CLINICAL DATA:  81 year old female with confusion, TIA. Jaundice, suspected pancreatic head mass. EXAM: MRI HEAD WITHOUT AND WITH CONTRAST TECHNIQUE: Multiplanar, multiecho pulse sequences of the brain and surrounding structures were obtained without and with intravenous contrast. CONTRAST:  84m GADAVIST GADOBUTROL 1 MMOL/ML IV SOLN COMPARISON:  None Available. FINDINGS: Brain: Cerebral volume is within normal limits for age. No restricted diffusion to suggest acute infarction. No midline shift, mass effect, evidence of mass lesion, ventriculomegaly, extra-axial collection or acute intracranial hemorrhage. Cervicomedullary junction and pituitary are within normal limits. 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 Otherwise mild for age patchy nonspecific white matter T2 and FLAIR hyperintensity (series 6, image 23). Occasional subtle chronic microhemorrhages in the brain (left frontal lobe series 7, image 58). Mild T2 heterogeneity in the bilateral basal ganglia and right thalamus. No cortical encephalomalacia. No abnormal enhancement identified. No dural thickening suspected on this 3 Tesla imaging. Vascular: Major intracranial vascular flow voids are preserved. The major dural venous sinuses are enhancing and appear to be patent. Skull and upper cervical spine: Negative for age visible cervical  spine. Visualized bone marrow signal is within normal limits. Sinuses/Orbits: Postoperative changes to both globes. Paranasal sinuses and mastoids are well aerated. Other: Grossly normal visible internal auditory structures. Negative visible scalp and face. IMPRESSION: 1. No metastatic disease or acute intracranial abnormality. 2. 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 Electronically Signed   By: HGenevie AnnM.D.   On: 03/10/2022 12:50

## 2022-04-10 ENCOUNTER — Other Ambulatory Visit (HOSPITAL_COMMUNITY): Payer: Self-pay

## 2022-04-11 ENCOUNTER — Inpatient Hospital Stay: Payer: PPO

## 2022-04-11 ENCOUNTER — Other Ambulatory Visit: Payer: Self-pay

## 2022-04-11 ENCOUNTER — Encounter: Payer: Self-pay | Admitting: Internal Medicine

## 2022-04-11 ENCOUNTER — Telehealth: Payer: Self-pay | Admitting: *Deleted

## 2022-04-11 ENCOUNTER — Inpatient Hospital Stay (HOSPITAL_BASED_OUTPATIENT_CLINIC_OR_DEPARTMENT_OTHER): Payer: PPO | Admitting: Hospice and Palliative Medicine

## 2022-04-11 VITALS — BP 125/53 | HR 75 | Temp 97.7°F

## 2022-04-11 DIAGNOSIS — Z515 Encounter for palliative care: Secondary | ICD-10-CM

## 2022-04-11 DIAGNOSIS — C250 Malignant neoplasm of head of pancreas: Secondary | ICD-10-CM

## 2022-04-11 DIAGNOSIS — R112 Nausea with vomiting, unspecified: Secondary | ICD-10-CM

## 2022-04-11 DIAGNOSIS — C801 Malignant (primary) neoplasm, unspecified: Secondary | ICD-10-CM | POA: Insufficient documentation

## 2022-04-11 LAB — COMPREHENSIVE METABOLIC PANEL
ALT: 59 U/L — ABNORMAL HIGH (ref 0–44)
AST: 50 U/L — ABNORMAL HIGH (ref 15–41)
Albumin: 2.8 g/dL — ABNORMAL LOW (ref 3.5–5.0)
Alkaline Phosphatase: 138 U/L — ABNORMAL HIGH (ref 38–126)
Anion gap: 10 (ref 5–15)
BUN: 24 mg/dL — ABNORMAL HIGH (ref 8–23)
CO2: 28 mmol/L (ref 22–32)
Calcium: 8.7 mg/dL — ABNORMAL LOW (ref 8.9–10.3)
Chloride: 96 mmol/L — ABNORMAL LOW (ref 98–111)
Creatinine, Ser: 1.05 mg/dL — ABNORMAL HIGH (ref 0.44–1.00)
GFR, Estimated: 53 mL/min — ABNORMAL LOW (ref >60)
Glucose, Bld: 204 mg/dL — ABNORMAL HIGH (ref 70–99)
Potassium: 3.6 mmol/L (ref 3.5–5.1)
Sodium: 134 mmol/L — ABNORMAL LOW (ref 135–145)
Total Bilirubin: 0.4 mg/dL (ref 0.3–1.2)
Total Protein: 7 g/dL (ref 6.5–8.1)

## 2022-04-11 LAB — CBC WITH DIFFERENTIAL/PLATELET
Abs Immature Granulocytes: 0.04 10*3/uL (ref 0.00–0.07)
Basophils Absolute: 0.1 10*3/uL (ref 0.0–0.1)
Basophils Relative: 1 %
Eosinophils Absolute: 0.3 10*3/uL (ref 0.0–0.5)
Eosinophils Relative: 3 %
HCT: 26.6 % — ABNORMAL LOW (ref 36.0–46.0)
Hemoglobin: 8.4 g/dL — ABNORMAL LOW (ref 12.0–15.0)
Immature Granulocytes: 0 %
Lymphocytes Relative: 22 %
Lymphs Abs: 2.2 10*3/uL (ref 0.7–4.0)
MCH: 26.3 pg (ref 26.0–34.0)
MCHC: 31.6 g/dL (ref 30.0–36.0)
MCV: 83.1 fL (ref 80.0–100.0)
Monocytes Absolute: 0.6 10*3/uL (ref 0.1–1.0)
Monocytes Relative: 5 %
Neutro Abs: 7.1 10*3/uL (ref 1.7–7.7)
Neutrophils Relative %: 69 %
Platelets: 347 10*3/uL (ref 150–400)
RBC: 3.2 MIL/uL — ABNORMAL LOW (ref 3.87–5.11)
RDW: 15.4 % (ref 11.5–15.5)
WBC: 10.3 10*3/uL (ref 4.0–10.5)
nRBC: 0 % (ref 0.0–0.2)

## 2022-04-11 LAB — MAGNESIUM: Magnesium: 1.9 mg/dL (ref 1.7–2.4)

## 2022-04-11 MED ORDER — ONDANSETRON HCL 4 MG PO TABS: 4.0000 mg | ORAL_TABLET | Freq: Three times a day (TID) | ORAL | 0 refills | Status: DC | PRN

## 2022-04-11 MED ORDER — SODIUM CHLORIDE 0.9 % IV SOLN
Freq: Once | INTRAVENOUS | Status: AC
  Filled 2022-04-11: qty 250

## 2022-04-11 NOTE — Telephone Encounter (Signed)
Spoke with patient's daughter. Pt experiencing nausea and low grade fevers and needs smc apt today. Apt provided. Pt has an apt with neurology at [facility] at 10 am. Pt can be seen in smc after this apt.

## 2022-04-11 NOTE — Progress Notes (Signed)
Patient feels extremely tired all the time.  Dr. Brigitte Pulse reduced her Dilantin dose today prior to this visit.

## 2022-04-11 NOTE — Progress Notes (Signed)
Medications reconciled with daughter via telephone call.

## 2022-04-11 NOTE — Patient Instructions (Signed)
Dehydration, Adult Dehydration is condition in which there is not enough water or other fluids in the body. This happens when a person loses more fluids than he or she takes in. Important body parts cannot work right without the right amount of fluids. Any loss of fluids from the body can cause dehydration. Dehydration can be mild, worse, or very bad. It should be treated right away to keep it from getting very bad. What are the causes? This condition may be caused by: Conditions that cause loss of water or other fluids, such as: Watery poop (diarrhea). Vomiting. Sweating a lot. Peeing (urinating) a lot. Not drinking enough fluids, especially when you: Are ill. Are doing things that take a lot of energy to do. Other illnesses and conditions, such as fever or infection. Certain medicines, such as medicines that take extra fluid out of the body (diuretics). Lack of safe drinking water. Not being able to get enough water and food. What increases the risk? The following factors may make you more likely to develop this condition: Having a long-term (chronic) illness that has not been treated the right way, such as: Diabetes. Heart disease. Kidney disease. Being 65 years of age or older. Having a disability. Living in a place that is high above the ground or sea (high in altitude). The thinner, dried air causes more fluid loss. Doing exercises that put stress on your body for a long time. What are the signs or symptoms? Symptoms of dehydration depend on how bad it is. Mild or worse dehydration Thirst. Dry lips or dry mouth. Feeling dizzy or light-headed, especially when you stand up from sitting. Muscle cramps. Your body making: Dark pee (urine). Pee may be the color of tea. Less pee than normal. Less tears than normal. Headache. Very bad dehydration Changes in skin. Skin may: Be cold to the touch (clammy). Be blotchy or pale. Not go back to normal right after you lightly pinch  it and let it go. Little or no tears, pee, or sweat. Changes in vital signs, such as: Fast breathing. Low blood pressure. Weak pulse. Pulse that is more than 100 beats a minute when you are sitting still. Other changes, such as: Feeling very thirsty. Eyes that look hollow (sunken). Cold hands and feet. Being mixed up (confused). Being very tired (lethargic) or having trouble waking from sleep. Short-term weight loss. Loss of consciousness. How is this treated? Treatment for this condition depends on how bad it is. Treatment should start right away. Do not wait until your condition gets very bad. Very bad dehydration is an emergency. You will need to go to a hospital. Mild or worse dehydration can be treated at home. You may be asked to: Drink more fluids. Drink an oral rehydration solution (ORS). This drink helps get the right amounts of fluids and salts and minerals in the blood (electrolytes). Very bad dehydration can be treated: With fluids through an IV tube. By getting normal levels of salts and minerals in your blood. This is often done by giving salts and minerals through a tube. The tube is passed through your nose and into your stomach. By treating the root cause. Follow these instructions at home: Oral rehydration solution If told by your doctor, drink an ORS: Make an ORS. Use instructions on the package. Start by drinking small amounts, about  cup (120 mL) every 5-10 minutes. Slowly drink more until you have had the amount that your doctor said to have. Eating and drinking          Drink enough clear fluid to keep your pee pale yellow. If you were told to drink an ORS, finish the ORS first. Then, start slowly drinking other clear fluids. Drink fluids such as: Water. Do not drink only water. Doing that can make the salt (sodium) level in your body get too low. Water from ice chips you suck on. Fruit juice that you have added water to (diluted). Low-calorie sports  drinks. Eat foods that have the right amounts of salts and minerals, such as: Bananas. Oranges. Potatoes. Tomatoes. Spinach. Do not drink alcohol. Avoid: Drinks that have a lot of sugar. These include: High-calorie sports drinks. Fruit juice that you did not add water to. Soda. Caffeine. Foods that are greasy or have a lot of fat or sugar. General instructions Take over-the-counter and prescription medicines only as told by your doctor. Do not take salt tablets. Doing that can make the salt level in your body get too high. Return to your normal activities as told by your doctor. Ask your doctor what activities are safe for you. Keep all follow-up visits as told by your doctor. This is important. Contact a doctor if: You have pain in your belly (abdomen) and the pain: Gets worse. Stays in one place. You have a rash. You have a stiff neck. You get angry or annoyed (irritable) more easily than normal. You are more tired or have a harder time waking than normal. You feel: Weak or dizzy. Very thirsty. Get help right away if you have: Any symptoms of very bad dehydration. Symptoms of vomiting, such as: You cannot eat or drink without vomiting. Your vomiting gets worse or does not go away. Your vomit has blood or green stuff in it. Symptoms that get worse with treatment. A fever. A very bad headache. Problems with peeing or pooping (having a bowel movement), such as: Watery poop that gets worse or does not go away. Blood in your poop (stool). This may cause poop to look black and tarry. Not peeing in 6-8 hours. Peeing only a small amount of very dark pee in 6-8 hours. Trouble breathing. These symptoms may be an emergency. Do not wait to see if the symptoms will go away. Get medical help right away. Call your local emergency services (911 in the [place] Do not drive yourself to the hospital. Summary Dehydration is a condition in which there is not enough water or other fluids  in the body. This happens when a person loses more fluids than he or she takes in. Treatment for this condition depends on how bad it is. Treatment should be started right away. Do not wait until your condition gets very bad. Drink enough clear fluid to keep your pee pale yellow. If you were told to drink an oral rehydration solution (ORS), finish the ORS first. Then, start slowly drinking other clear fluids. Take over-the-counter and prescription medicines only as told by your doctor. Get help right away if you have any symptoms of very bad dehydration. This information is not intended to replace advice given to you by your health care provider. Make sure you discuss any questions you have with your health care provider. Document Revised: 09/21/2021 Document Reviewed: 12/26/2018 Elsevier Patient Education  2023 [employer]  

## 2022-04-11 NOTE — Progress Notes (Signed)
Symptom Management Time at [facility] Telephone:([phone] Fax:([phone]  Patient Care Team: Remi Haggard, FNP as PCP - General (Family Medicine) Clent Jacks, RN as Oncology Nurse Navigator   NAME OF PATIENT: Shirley Nichols  [id number]  Oct 19, 1940   DATE OF VISIT: 04/11/22  REASON FOR CONSULT: BERIT RACZKOWSKI is a 81 y.o. female with multiple medical problems including newly diagnosed pancreatic cancer.  Patient was hospitalized 03/03/2022 to 03/31/2022 for evaluation of elevated LFTs. MRCP revealed a localized mass in the head of the pancreas with CBD stricture and diffuse biliary ductal dilation.  Patient was transferred to [facility] and underwent ERCP on 03/05/2022 with sphincterotomy and biliary stent placement.  Brush biopsy positive for pancreatic adenocarcinoma.  Hospitalization was also complicated by metabolic encephalopathy and questionable seizures requiring intubation.  Patient has been evaluated by hepatobiliary surgeon but unclear if she is a surgical candidate.  She has poor performance status and is not a candidate for aggressive chemotherapy but plan is to initiate single agent gemcitabine.  INTERVAL HISTORY: Patient saw Dr. Pershing Cox on 04/07/2022 with plan for possible single agent gemcitabine.  However, patient's performance status was quite poor and plan was for follow-up in 2 weeks to reassess prior to making any treatment decisions.  Patient presents to [facility] today for evaluation of fatigue and poor oral intake.  Patient saw neurology this morning with family concerned that her Dilantin might be contributing to symptoms.  Patient was given the weaning parameters by neurology for gradual discontinuation of Dilantin.  Patient husband denies fever or chills.  No shortness of breath, cough, or congestion.  No chest pain.  No bleeding.  No focal abdominal pain.  No urinary symptoms.  Husband reports that patient had a normal  bowel movement this morning.  PAST MEDICAL HISTORY: Past Medical History:  Diagnosis Date   Cancer ([place])    skin cancer   Hyperlipidemia    Hypertension     PAST SURGICAL HISTORY:  Past Surgical History:  Procedure Laterality Date   ABDOMINAL HYSTERECTOMY     BILIARY BRUSHING  03/05/2022   Procedure: BILIARY BRUSHING;  Surgeon: Carol Ada, MD;  Location: [facility] ENDOSCOPY;  Service: Gastroenterology;;   BILIARY STENT PLACEMENT  03/05/2022   Procedure: BILIARY STENT PLACEMENT;  Surgeon: Carol Ada, MD;  Location: [facility] ENDOSCOPY;  Service: Gastroenterology;;   COLONOSCOPY     02/21/2002 adenomatous polyp, 03/23/2011   COLONOSCOPY W/ POLYPECTOMY  02/2000   TA polyp removed from ascending colon.  performed through [facility] (? affiliate in [place])   COLONOSCOPY WITH PROPOFOL N/A 11/03/2016   Procedure: COLONOSCOPY WITH PROPOFOL;  Surgeon: Manya Silvas, MD;  Location: [facility] ENDOSCOPY;  Service: Endoscopy;  Laterality: N/A;   ERCP N/A 03/05/2022   Procedure: ENDOSCOPIC RETROGRADE CHOLANGIOPANCREATOGRAPHY (ERCP);  Surgeon: Carol Ada, MD;  Location: [place];  Service: Gastroenterology;  Laterality: N/A;   HEMORROIDECTOMY     TONSILLECTOMY      HEMATOLOGY/ONCOLOGY HISTORY:  Oncology History  Pancreatic cancer ([place])  03/03/2022 Initial Diagnosis   Pancreatic cancer ([place])   03/03/2022 Imaging   CT abdomen and pelvis  Hazy stranding about the pancreatic head suggestive of pancreatitis. This is incompletely evaluated without IV contrast. Dilated gallbladder and common bile duct. MRI/MRCP is recommended to evaluate for choledocholithiasis or obstructing mass.   03/03/2022 Imaging   MRI  Image degradation by motion artifact noted.   3.4 cm masslike density in the pancreatic head, highly suspicious for pancreatic carcinoma.  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 No evidence of choledocholithiasis.   2.6 cm left  adrenal mass is incompletely characterized, but favors a benign hepatic adenoma.   03/05/2022 Pathology Results   FINAL MICROSCOPIC DIAGNOSIS:  - Malignant cells consistent with adenocarcinoma    03/05/2022 Procedure   ERCP - The major papilla appeared normal. - The upper third of the main bile duct and middle third of the main bile duct were dilated, with a mass causing an obstruction. - A biliary sphincterotomy was performed. - One covered metal stent was placed into the common bile duct. - Cells for cytology obtained in the lower third of the main duct.   03/09/2022 Cancer Staging   Staging form: Exocrine Pancreas, AJCC 8th Edition - Clinical stage from 03/09/2022: Stage IB (cT2, cN0, cM0) - Signed by Heath Lark, MD on 03/09/2022 Stage prefix: Initial diagnosis Total positive nodes: 0     ALLERGIES:  has No Known Allergies.  MEDICATIONS:  Current Outpatient Medications  Medication Sig Dispense Refill   acetaminophen (TYLENOL) 325 MG tablet Take 2 tablets (650 mg total) by mouth every 6 (six) hours as needed for mild pain, fever or headache.     aspirin EC 81 MG tablet Take 81 mg by mouth daily.     ezetimibe (ZETIA) 10 MG tablet Take 1 tablet (10 mg total) by mouth every evening. 90 tablet 3   fluvastatin XL (LESCOL XL) 80 MG 24 hr tablet Take 1 tablet (80 mg total) by mouth daily. 30 tablet 0   fluvastatin XL (LESCOL XL) 80 MG 24 hr tablet Take 1 tablet (80 mg total) by mouth daily. 90 tablet 3   levETIRAcetam (KEPPRA) 750 MG tablet Take 1 tablet (750 mg total) by mouth 2 (two) times daily. 180 tablet 3   magnesium oxide (MAG-OX) 400 MG tablet Take 2 tablets (800 mg total) by mouth 2 (two) times daily. 120 tablet 0   oxyCODONE (OXY IR/ROXICODONE) 5 MG immediate release tablet Take 1/2 tablet (2.5 mg total) by mouth every 8 (eight) hours as needed for moderate pain or severe pain. 10 tablet 0   phenytoin (DILANTIN) 300 MG ER capsule Take 1 capsule (300 mg total) by mouth at  bedtime 90 capsule 3   polyethylene glycol (MIRALAX / GLYCOLAX) 17 g packet Take 17 g by mouth daily. 14 each 0   senna-docusate (SENOKOT-S) 8.6-50 MG tablet Take 1 tablet by mouth 2 (two) times daily.     SYMBICORT 160-4.5 MCG/ACT inhaler Inhale 2 puffs into the lungs as needed (for wheezing or shortness of breath). 10.2 g 4   No current facility-administered medications for this visit.    VITAL SIGNS: There were no vitals taken for this visit. There were no vitals filed for this visit.  Estimated body mass index is 21.16 kg/m as calculated from the following:   Height as of 03/23/22: _0  (1.626 m).   Weight as of 04/07/22: 123 lb 4.8 oz (55.9 kg).  LABS: CBC:    Component Value Date/Time   WBC 6.7 03/30/2022 0559   HGB 8.1 (L) 03/30/2022 0559   HCT 25.4 (L) 03/30/2022 0559   PLT 320 03/30/2022 0559   MCV 83.0 03/30/2022 0559   NEUTROABS 3.9 03/30/2022 0559   LYMPHSABS 2.0 03/30/2022 0559   MONOABS 0.5 03/30/2022 0559   EOSABS 0.3 03/30/2022 0559   BASOSABS 0.1 03/30/2022 0559   Comprehensive Metabolic Panel:    Component Value  Date/Time   NA 140 03/30/2022 0559   K 4.5 03/30/2022 0559   CL 107 03/30/2022 0559   CO2 25 03/30/2022 0559   BUN 22 03/30/2022 0559   CREATININE 1.02 (H) 03/30/2022 0559   GLUCOSE 111 (H) 03/30/2022 0559   CALCIUM 8.9 03/30/2022 0559   AST 21 03/30/2022 0559   ALT 29 03/30/2022 0559   ALKPHOS 158 (H) 03/30/2022 0559   BILITOT 0.5 03/30/2022 0559   PROT 5.6 (L) 03/30/2022 0559   ALBUMIN 2.5 (L) 03/30/2022 0559    RADIOGRAPHIC STUDIES: CT ABD PELVIS W/WO CM ONCOLOGY PANCREATIC PROTOCOL  Result Date: 04/07/2022 CLINICAL DATA:  Pancreatic head adenocarcinoma diagnosed on 03/05/2022 ERCP. Staging. * Tracking Code: BO * EXAM: CT ABDOMEN AND PELVIS WITHOUT AND WITH CONTRAST TECHNIQUE: Multidetector CT imaging of the abdomen and pelvis was performed following the standard protocol before and following the bolus administration of intravenous  contrast. RADIATION DOSE REDUCTION: This exam was performed according to the departmental dose-optimization program which includes automated exposure control, adjustment of the mA and/or kV according to patient size and/or use of iterative reconstruction technique. CONTRAST:  22m OMNIPAQUE IOHEXOL 300 MG/ML  SOLN COMPARISON:  03/03/2022 MRI abdomen and unenhanced CT abdomen/pelvis FINDINGS: Lower chest: No significant pulmonary nodules or acute consolidative airspace disease. Coronary atherosclerosis. Hepatobiliary: Normal liver size. Subcapsular hypodense 0.8 cm far inferior right liver lesion (series 9/image 57), too small to characterize, stable from 03/03/2022 MRI abdomen study, where it was T2 hyperintense and compatible with a tiny benign cyst. No additional liver lesions. Metallic CBD stent in place extending from the mid CBD to the descending duodenal lumen. Pneumobilia throughout the intrahepatic bile ducts and nondistended gallbladder indicating stent patency. Proximal CBD diameter 7 mm. Mild central intrahepatic biliary ductal dilatation. Pancreas: Poorly defined hypoenhancing 2.9 x 2.4 cm pancreatic head mass (series 9/image 47), previously 2.9 x 2.3 cm on 03/03/2022 MRI using similar measurement technique, not appreciably changed. No clear tumor involvement of the SMV, portal veins or SMA. Dilated main pancreatic duct (6 mm diameter) with abrupt caliber transition at the level of the pancreatic head. Diffuse haziness of the peripancreatic fat without measurable peripancreatic fluid collections. No evidence of pancreatic parenchymal necrosis. Tiny 1.2 x 0.6 cm pancreatic body pseudocyst (series 9/image 44), slightly increased from 0.8 x 0.4 cm on 03/03/2022 MRI. Spleen: Normal size. No mass. Adrenals/Urinary Tract: Left adrenal 2.2 x 1.8 cm nodule with precontrast density 4 HU, compatible with an adenoma. Mildly irregular right adrenal gland is unchanged, without discrete right adrenal nodules. No  hydronephrosis. Small simple bilateral renal cysts, largest 1.4 cm in the anterior upper right kidney. No suspicious renal masses. Normal bladder. Stomach/Bowel: Small hiatal hernia. Otherwise normal nondistended stomach. Normal caliber small bowel with no small bowel wall thickening. Appendix not discretely visualized. Normal large bowel with no diverticulosis, large bowel wall thickening or pericolonic fat stranding. Vascular/Lymphatic: Atherosclerotic nonaneurysmal abdominal aorta. Patent portal, splenic, hepatic and renal veins. No pathologically enlarged lymph nodes in the abdomen or pelvis. Reproductive: Status post hysterectomy, with no abnormal findings at the vaginal cuff. No adnexal mass. Other: No pneumoperitoneum, ascites or focal fluid collection. Nonspecific subcutaneous 0.8 cm ventral left abdominal wall soft tissue density nodule (series 9/image 85), new since 03/03/2022 CT. Musculoskeletal: No aggressive appearing focal osseous lesions. Mild thoracolumbar spondylosis. IMPRESSION: 1. Poorly defined hypoenhancing 2.9 cm pancreatic head mass, not appreciably changed since 03/03/2022 MRI, compatible with known primary pancreatic adenocarcinoma. No evidence of vascular invasion by the pancreatic tumor. 2. Dilated  main pancreatic duct with abrupt caliber transition at the level of the pancreatic head. Well-positioned CBD stent with pneumobilia indicating stent patency. 3. No lymphadenopathy or other findings highly suspicious for metastatic disease in the abdomen or pelvis. Nonspecific subcutaneous 0.8 cm ventral left abdominal wall soft tissue density nodule, new since 03/03/2022 CT, most commonly due to subcutaneous injections. 4. Evidence of ongoing pancreatitis. Tiny 1.2 x 0.6 cm pancreatic body pseudocyst, slightly increased. 5. 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 6. Small hiatal hernia. 7. Coronary atherosclerosis. 8.  Aortic Atherosclerosis (ICD10-I70.0).  Electronically Signed   By: Ilona Sorrel M.D.   On: 04/07/2022 16:08   Overnight EEG with video  Result Date: 03/16/2022 Lora Havens, MD     03/17/2022  6:39 AM Patient Name: LEE-ANNE FLICKER MRN: [id number] Epilepsy Attending: Lora Havens Referring Physician/Provider: Katy Apo, NP Duration: 03/15/2022 0920 to 03/16/2022 0920  Patient history: 81yo F with right leg tremoring after the MRI and they also noted that she was talking gibberish all afternoon for 4 to 5 hours. At approximately 1830, she was noted to have right arm and leg jerking with left gaze deviation. EEG to evaluate for seizure  Level of alertness: lethargic  AEDs during EEG study: LEV, PHT, Propofol  Technical aspects: This EEG study was done with scalp electrodes positioned according to the 10-20 International system of electrode placement. Electrical activity was reviewed with band pass filter of 1-_0 , sensitivity of 7 uV/mm, display speed of 68m/sec with a _1  notched filter applied as appropriate. EEG data were recorded continuously and digitally stored.  Video monitoring was available and reviewed as appropriate.  Description: EEG showed continuous generalized 3 to 6 Hz theta-delta slowing. Hyperventilation and photic stimulation were not performed.    Event button was pressed on 03/15/2022 at 1123, 1125, 1142, 1213,[id number], [phone] right lower extremity rhythmic twitching. Concomitant EEG before, during and after the event did not show any EEG change.  ABNORMALITY - Continuous slow, generalized  IMPRESSION: This study is suggestive of moderate to severe diffuse encephalopathy, nonspecific etiology. No epileptiform discharges were seen throughout the recording.  Event button was pressed on 03/15/2022 at 1123, 1125, 1142, [phone] right lower extremity rhythmic twitching without concomitant EEG change. However, focal motor seizures may not be seen on scalp EEG. Therefore clinical  correlation is recommended.    PLora Havens  DG Abd Portable 1V  Result Date: 03/15/2022 CLINICAL DATA:  Evaluate location of tip of enteric tube EXAM: PORTABLE ABDOMEN - 1 VIEW COMPARISON:  Previous studies done earlier today FINDINGS: There is slight interval withdrawal of the enteric tube. Tip and side port are still within the fundus of the stomach. Bowel gas pattern is nonspecific. Heart is enlarged in size. Bilateral pleural effusions are seen. IMPRESSION: Tip and side port of enteric tube are noted in the medial aspect of fundus of the stomach. Electronically Signed   By: PElmer PickerM.D.   On: 03/15/2022 14:30   DG Abd Portable 1V  Result Date: 03/15/2022 CLINICAL DATA:  Evaluate location of enteric tube EXAM: PORTABLE ABDOMEN - 1 VIEW COMPARISON:  Previous study done earlier today FINDINGS: There is interval repositioning of enteric tube with its tip in the fundus of the stomach. Biliary stent is seen in the course of distal common bile duct. Bowel gas pattern is nonspecific. IMPRESSION: Tip of enteric tube is seen in the fundus of the stomach. Electronically Signed  By: Elmer Picker M.D.   On: 03/15/2022 14:29   DG Abd Portable 1V  Result Date: 03/15/2022 CLINICAL DATA:  OG tube placement. EXAM: PORTABLE ABDOMEN - 1 VIEW COMPARISON:  One-view chest x-ray 03/14/2022 FINDINGS: Biliary stent is in place.  Bowel gas pattern is unremarkable. NG tube is coiled in the stomach. The tip is directed cephalad in the distal esophagus. The portion directed upward in the esophagus has increased since the previous exam. Bilateral pleural effusions and basilar airspace disease noted. IMPRESSION: 1. NG tube is coiled in the stomach. The tip is directed cephalad in the distal esophagus. 2. Biliary stent in place. Electronically Signed   By: San Morelle M.D.   On: 03/15/2022 08:47   DG CHEST PORT 1 VIEW  Result Date: 03/14/2022 CLINICAL DATA:  Endotracheal tube EXAM: PORTABLE  CHEST 1 VIEW COMPARISON:  Earlier today at 2:02 p.m. FINDINGS: 4:45 p.m. Support apparatus: Endotracheal tube terminates 4.6 cm above carina. Nasogastric tube placed in the interval, looped in the stomach with tip at distal esophagus. Right PICC line tip at superior caval/atrial junction. Heart/mediastinum: Midline trachea.  Moderate cardiomegaly Pleura: Small bilateral pleural effusions are similar. No pneumothorax. Lungs: Left-greater-than-right base airspace disease is not significantly changed.Moderate interstitial edema is increased. Other: Numerous leads and wires project over the chest. IMPRESSION: Appropriate position of endotracheal tube. Nasogastric tube looped in the stomach with tip at the distal esophagus. Similar congestive heart failure with layering bilateral pleural effusions and bibasilar airspace disease. These results will be called to the ordering clinician or representative by the Radiologist Assistant, and communication documented in the PACS or [employer]. Electronically Signed   By: Abigail Miyamoto M.D.   On: 03/14/2022 17:11   DG Abd Portable 1V  Result Date: 03/14/2022 CLINICAL DATA:  Abnormal liver function tests. Evaluate biliary stent EXAM: PORTABLE ABDOMEN - 1 VIEW COMPARISON:  CT chest done on 03/11/2022 FINDINGS: Bowel gas pattern is nonspecific. No abnormal masses or calcifications are seen. Increased density in left lower lung fields may suggest pleural effusion. There is a stent in right side of abdomen. The stent is vertically oriented, possibly positioned in the distal common bile duct and duodenum. IMPRESSION: Nonspecific bowel gas pattern. A biliary stent is noted in right side of abdomen. The stent is more vertically oriented than usual. This may suggest presence of stent in the distal common bile duct and duodenum. Electronically Signed   By: Elmer Picker M.D.   On: 03/14/2022 15:48   DG Chest Port 1 View  Result Date: 03/14/2022 CLINICAL DATA:  Pulmonary  aspiration.  Seizure. EXAM: PORTABLE CHEST 1 VIEW COMPARISON:  CT chest 03/11/2022 and chest radiograph 03/08/2022 FINDINGS: Hazy opacities at the lung bases compatible with lower lobe atelectasis or pneumonia likely superimposed on pleural effusions which were better shown on the 03/11/2022 exam. Right PICC line tip: SVC. Mild enlargement of the cardiopericardial silhouette. Indistinct pulmonary vasculature suggesting pulmonary venous hypertension. IMPRESSION: 1. Hazy opacities at the lung bases compatible with lower lobe atelectasis or pneumonia likely superimposed on pleural effusions. 2. Mild enlargement of the cardiopericardial silhouette with indistinct pulmonary vasculature suggesting pulmonary venous hypertension. Electronically Signed   By: Van Clines M.D.   On: 03/14/2022 14:31   DG FL GUIDED LUMBAR PUNCTURE  Result Date: 03/13/2022 CLINICAL DATA:  Acute encephalopathy with focal seizures. Radiology was consulted to perform diagnostic lumbar puncture under fluoroscopic guidance EXAM: DIAGNOSTIC LUMBAR PUNCTURE UNDER FLUOROSCOPIC GUIDANCE COMPARISON:  Head CT 03/10/2022 and abdominal CT 03/03/2022 were  reviewed. FLUOROSCOPY: Radiation Exposure Index (as provided by the fluoroscopic device): 14.40 mGy Kerma PROCEDURE: Informed consent was obtained from the patient prior to the procedure, including potential complications of headache, allergy, and pain. With the patient prone, the lower back was prepped with Betadine. 1% Lidocaine was used for local anesthesia. Lumbar puncture was performed at the L5-S1 level using a 20 gauge needle with return of clear CSF with an opening pressure of 36 cm water. 15 ml of CSF were obtained for laboratory studies. Closing pressure of 18 cm water. The patient tolerated the procedure well and there were no apparent complications. Procedure performed by Reatha Armour, PA under the supervision of Richardean Sale, MD. IMPRESSION: 1.  Technically successful lumbar  puncture at L5-S1 level 2.  Opening pressure 36 cm water, closing pressure 18 cm water 3.  15 mL of clear CSF obtained for laboratory study 4.  Patient tolerated procedure well with no apparent complications Read by: Reatha Armour, PA-C Electronically Signed   By: Richardean Sale M.D.   On: 03/13/2022 15:17   Overnight EEG with video  Result Date: 03/13/2022 Lora Havens, MD     03/14/2022  9:20 AM Patient Name: MICHELINE MARKES MRN: [id number] Epilepsy Attending: Lora Havens Referring Physician/Provider: Katy Apo, NP Duration: 03/12/2022 1721 to 03/13/2022 1721  Patient history: 81yo F with right leg tremoring after the MRI and they also noted that she was talking gibberish all afternoon for 4 to 5 hours. At approximately 1830, she was noted to have right arm and leg jerking with left gaze deviation. EEG to evaluate for seizure  Level of alertness: Awake, asleep  AEDs during EEG study: LEV  Technical aspects: This EEG study was done with scalp electrodes positioned according to the 10-20 International system of electrode placement. Electrical activity was reviewed with band pass filter of 1-_0 , sensitivity of 7 uV/mm, display speed of 60m/sec with a _1  notched filter applied as appropriate. EEG data were recorded continuously and digitally stored.  Video monitoring was available and reviewed as appropriate.  Description: No clear posterior dominant rhythm was seen. Sleep was characterized by vertex waves, sleep spindles (12 to 14 Hz), maximal frontocentral region. EEG showed continuous generalized and lateralized left hemisphere 3 to 6 Hz theta-delta slowing with overriding 12-_2  generalized beta activity. Hyperventilation and photic stimulation were not performed.   Event button was pressed on 03/13/2022 at 0958 for right lower extremity rhythmic twitching.  Concomitant EEG before, during and after the event did not show any EEG change.  ABNORMALITY - Continuous slow, generalized  and lateralized left hemisphere  IMPRESSION: This study is suggestive of cortical dysfunction arising from left hemisphere likely secondary to underlying structural abnormality, post-ictal state. Additionally there is moderate to severe diffuse encephalopathy, nonspecific etiology. No epileptiform discharges were seen throughout the recording. Event button was pressed on 03/13/2022 at 0958 for rhythmic right lower extremity twitching without concomitant EEG change.  However, focal motor seizures may not be seen on scalp EEG.  Therefore clinical correlation is recommended.  Priyanka O Yadav   [place]KG SITE RITE  Result Date: 03/12/2022 If Site Rite image not attached, placement could not be confirmed due to current cardiac rhythm.   PERFORMANCE STATUS (ECOG) : 3 - Symptomatic, >50% confined to bed  Review of Systems Unless otherwise noted, a complete review of systems is negative.  Physical Exam General: NAD Cardiovascular: regular rate and rhythm Pulmonary: clear ant fields Abdomen: soft, nontender, + bowel sounds  GU: no suprapubic tenderness Extremities: no edema, no joint deformities Skin: no rashes Neurological: Weakness but otherwise nonfocal  IMPRESSION/PLAN: Weakness -likely multifactorial etiology given recent prolonged hospitalization, underlying frailty, cancer, poor nutritional status, and +/- medications.  Labs suggestive of mild dehydration.  We will proceed with gentle IV fluids today.  Referral to [place].  Patient will likely benefit from ongoing supportive care.   Nausea without vomiting -husband reports that this resolves when patient eats something.  Nutrition consult pending.  Recommend maximizing oral intake and starting oral nutritional supplements.  IVFs today. Will send rx for Zofran as needed.  Mildly elevated LFTs -alk phos is improving.  Patient is nontoxic-appearing.  Has been and patient deny fevers.  Benign exam with no abdominal tenderness on palpation.   Discussed with Dr. Darrall Dears and will recheck labs later this week.  If LFTs continue to rise, patient will likely require repeat imaging.  Case and plan discussed with Dr. Darrall Dears. RTC later this week for repeat labs/fluids   Patient expressed understanding and was in agreement with this plan. She also understands that She can call clinic at any time with any questions, concerns, or complaints.   Thank you for allowing me to participate in the care of this very pleasant patient.   Time Total: 25 minutes  Visit consisted of counseling and education dealing with the complex and emotionally intense issues of symptom management in the setting of serious illness.Greater than 50%  of this time was spent counseling and coordinating care related to the above assessment and plan.  Signed by: Altha Harm, PhD, NP-C

## 2022-04-12 LAB — FUNGUS CULTURE WITH STAIN

## 2022-04-12 LAB — FUNGAL ORGANISM REFLEX

## 2022-04-12 LAB — FUNGUS CULTURE RESULT

## 2022-04-14 ENCOUNTER — Inpatient Hospital Stay: Payer: PPO

## 2022-04-14 ENCOUNTER — Other Ambulatory Visit: Payer: Self-pay

## 2022-04-14 ENCOUNTER — Other Ambulatory Visit: Payer: PPO

## 2022-04-14 ENCOUNTER — Ambulatory Visit: Payer: PPO

## 2022-04-14 ENCOUNTER — Encounter: Payer: Self-pay | Admitting: Hospice and Palliative Medicine

## 2022-04-14 VITALS — BP 144/68 | HR 73 | Temp 98.6°F | Resp 16

## 2022-04-14 DIAGNOSIS — E860 Dehydration: Secondary | ICD-10-CM

## 2022-04-14 DIAGNOSIS — C250 Malignant neoplasm of head of pancreas: Secondary | ICD-10-CM

## 2022-04-14 DIAGNOSIS — D649 Anemia, unspecified: Secondary | ICD-10-CM

## 2022-04-14 LAB — COMPREHENSIVE METABOLIC PANEL
ALT: 84 U/L — ABNORMAL HIGH (ref 0–44)
AST: 71 U/L — ABNORMAL HIGH (ref 15–41)
Albumin: 2.6 g/dL — ABNORMAL LOW (ref 3.5–5.0)
Alkaline Phosphatase: 132 U/L — ABNORMAL HIGH (ref 38–126)
Anion gap: 11 (ref 5–15)
BUN: 23 mg/dL (ref 8–23)
CO2: 27 mmol/L (ref 22–32)
Calcium: 8.7 mg/dL — ABNORMAL LOW (ref 8.9–10.3)
Chloride: 94 mmol/L — ABNORMAL LOW (ref 98–111)
Creatinine, Ser: 0.84 mg/dL (ref 0.44–1.00)
GFR, Estimated: >60 mL/min (ref >60)
Glucose, Bld: 132 mg/dL — ABNORMAL HIGH (ref 70–99)
Potassium: 3.7 mmol/L (ref 3.5–5.1)
Sodium: 132 mmol/L — ABNORMAL LOW (ref 135–145)
Total Bilirubin: 0.4 mg/dL (ref 0.3–1.2)
Total Protein: 6.9 g/dL (ref 6.5–8.1)

## 2022-04-14 LAB — CBC WITH DIFFERENTIAL/PLATELET
Abs Immature Granulocytes: 0.04 10*3/uL (ref 0.00–0.07)
Basophils Absolute: 0.1 10*3/uL (ref 0.0–0.1)
Basophils Relative: 1 %
Eosinophils Absolute: 0.2 10*3/uL (ref 0.0–0.5)
Eosinophils Relative: 2 %
HCT: 24.6 % — ABNORMAL LOW (ref 36.0–46.0)
Hemoglobin: 7.9 g/dL — ABNORMAL LOW (ref 12.0–15.0)
Immature Granulocytes: 0 %
Lymphocytes Relative: 23 %
Lymphs Abs: 2.4 10*3/uL (ref 0.7–4.0)
MCH: 26.2 pg (ref 26.0–34.0)
MCHC: 32.1 g/dL (ref 30.0–36.0)
MCV: 81.5 fL (ref 80.0–100.0)
Monocytes Absolute: 0.7 10*3/uL (ref 0.1–1.0)
Monocytes Relative: 6 %
Neutro Abs: 7.1 10*3/uL (ref 1.7–7.7)
Neutrophils Relative %: 68 %
Platelets: 327 10*3/uL (ref 150–400)
RBC: 3.02 MIL/uL — ABNORMAL LOW (ref 3.87–5.11)
RDW: 15.2 % (ref 11.5–15.5)
WBC: 10.5 10*3/uL (ref 4.0–10.5)
nRBC: 0 % (ref 0.0–0.2)

## 2022-04-14 LAB — FOLATE: Folate: 13.6 ng/mL (ref >5.9)

## 2022-04-14 LAB — VITAMIN B12: Vitamin B-12: 654 pg/mL (ref 180–914)

## 2022-04-14 LAB — FERRITIN: Ferritin: 559 ng/mL — ABNORMAL HIGH (ref 11–307)

## 2022-04-14 MED ORDER — SODIUM CHLORIDE 0.9 % IV SOLN
INTRAVENOUS | Status: DC
  Filled 2022-04-14 (×2): qty 250

## 2022-04-15 LAB — CANCER ANTIGEN 19-9: CA 19-9: 908 U/mL — ABNORMAL HIGH (ref 0–35)

## 2022-04-16 ENCOUNTER — Encounter: Payer: Self-pay | Admitting: Hospice and Palliative Medicine

## 2022-04-16 ENCOUNTER — Encounter: Payer: Self-pay | Admitting: Internal Medicine

## 2022-04-17 ENCOUNTER — Encounter: Payer: Self-pay | Admitting: Internal Medicine

## 2022-04-17 ENCOUNTER — Other Ambulatory Visit: Payer: Self-pay

## 2022-04-17 ENCOUNTER — Inpatient Hospital Stay
Admission: EM | Admit: 2022-04-17 | Discharge: 2022-04-22 | DRG: 947 | Disposition: A | Discharge status: Home Health | Payer: PPO | Attending: Internal Medicine | Admitting: Internal Medicine

## 2022-04-17 ENCOUNTER — Other Ambulatory Visit: Payer: PPO

## 2022-04-17 ENCOUNTER — Ambulatory Visit: Payer: PPO

## 2022-04-17 ENCOUNTER — Observation Stay: Payer: PPO

## 2022-04-17 ENCOUNTER — Emergency Department: Payer: PPO

## 2022-04-17 DIAGNOSIS — C250 Malignant neoplasm of head of pancreas: Secondary | ICD-10-CM

## 2022-04-17 DIAGNOSIS — R609 Edema, unspecified: Secondary | ICD-10-CM | POA: Diagnosis present

## 2022-04-17 DIAGNOSIS — I509 Heart failure, unspecified: Secondary | ICD-10-CM | POA: Diagnosis not present

## 2022-04-17 DIAGNOSIS — I110 Hypertensive heart disease with heart failure: Secondary | ICD-10-CM | POA: Diagnosis present

## 2022-04-17 DIAGNOSIS — Z66 Do not resuscitate: Secondary | ICD-10-CM | POA: Diagnosis present

## 2022-04-17 DIAGNOSIS — Z79899 Other long term (current) drug therapy: Secondary | ICD-10-CM

## 2022-04-17 DIAGNOSIS — D630 Anemia in neoplastic disease: Secondary | ICD-10-CM | POA: Diagnosis present

## 2022-04-17 DIAGNOSIS — R7989 Other specified abnormal findings of blood chemistry: Secondary | ICD-10-CM | POA: Diagnosis not present

## 2022-04-17 DIAGNOSIS — Z85828 Personal history of other malignant neoplasm of skin: Secondary | ICD-10-CM

## 2022-04-17 DIAGNOSIS — Z515 Encounter for palliative care: Secondary | ICD-10-CM

## 2022-04-17 DIAGNOSIS — D72829 Elevated white blood cell count, unspecified: Secondary | ICD-10-CM | POA: Diagnosis present

## 2022-04-17 DIAGNOSIS — D509 Iron deficiency anemia, unspecified: Secondary | ICD-10-CM | POA: Diagnosis present

## 2022-04-17 DIAGNOSIS — R531 Weakness: Principal | ICD-10-CM

## 2022-04-17 DIAGNOSIS — I10 Essential (primary) hypertension: Secondary | ICD-10-CM | POA: Diagnosis present

## 2022-04-17 DIAGNOSIS — D649 Anemia, unspecified: Secondary | ICD-10-CM | POA: Diagnosis not present

## 2022-04-17 DIAGNOSIS — Z87891 Personal history of nicotine dependence: Secondary | ICD-10-CM

## 2022-04-17 DIAGNOSIS — K831 Obstruction of bile duct: Secondary | ICD-10-CM | POA: Diagnosis not present

## 2022-04-17 DIAGNOSIS — R262 Difficulty in walking, not elsewhere classified: Secondary | ICD-10-CM

## 2022-04-17 DIAGNOSIS — Z90710 Acquired absence of both cervix and uterus: Secondary | ICD-10-CM

## 2022-04-17 DIAGNOSIS — Z1152 Encounter for screening for COVID-19: Secondary | ICD-10-CM

## 2022-04-17 DIAGNOSIS — E876 Hypokalemia: Secondary | ICD-10-CM | POA: Diagnosis not present

## 2022-04-17 DIAGNOSIS — E785 Hyperlipidemia, unspecified: Secondary | ICD-10-CM | POA: Diagnosis not present

## 2022-04-17 DIAGNOSIS — E43 Unspecified severe protein-calorie malnutrition: Secondary | ICD-10-CM | POA: Insufficient documentation

## 2022-04-17 DIAGNOSIS — D75839 Thrombocytosis, unspecified: Secondary | ICD-10-CM | POA: Diagnosis present

## 2022-04-17 DIAGNOSIS — R569 Unspecified convulsions: Secondary | ICD-10-CM

## 2022-04-17 DIAGNOSIS — M7121 Synovial cyst of popliteal space [Baker], right knee: Secondary | ICD-10-CM

## 2022-04-17 DIAGNOSIS — C259 Malignant neoplasm of pancreas, unspecified: Secondary | ICD-10-CM | POA: Diagnosis present

## 2022-04-17 DIAGNOSIS — J449 Chronic obstructive pulmonary disease, unspecified: Secondary | ICD-10-CM | POA: Diagnosis present

## 2022-04-17 DIAGNOSIS — C801 Malignant (primary) neoplasm, unspecified: Secondary | ICD-10-CM

## 2022-04-17 DIAGNOSIS — Z7982 Long term (current) use of aspirin: Secondary | ICD-10-CM

## 2022-04-17 DIAGNOSIS — D638 Anemia in other chronic diseases classified elsewhere: Secondary | ICD-10-CM | POA: Diagnosis present

## 2022-04-17 DIAGNOSIS — E440 Moderate protein-calorie malnutrition: Secondary | ICD-10-CM | POA: Diagnosis present

## 2022-04-17 DIAGNOSIS — Z6821 Body mass index (BMI) 21.0-21.9, adult: Secondary | ICD-10-CM

## 2022-04-17 LAB — URINALYSIS, ROUTINE W REFLEX MICROSCOPIC
Bilirubin Urine: NEGATIVE
Glucose, UA: NEGATIVE mg/dL
Hgb urine dipstick: NEGATIVE
Ketones, ur: NEGATIVE mg/dL
Leukocytes,Ua: NEGATIVE
Nitrite: NEGATIVE
Protein, ur: 100 mg/dL — AB
Specific Gravity, Urine: 1.017 (ref 1.005–1.030)
Squamous Epithelial / HPF: NONE SEEN (ref 0–5)
pH: 6 (ref 5.0–8.0)

## 2022-04-17 LAB — COMPREHENSIVE METABOLIC PANEL
ALT: 124 U/L — ABNORMAL HIGH (ref 0–44)
AST: 100 U/L — ABNORMAL HIGH (ref 15–41)
Albumin: 2.4 g/dL — ABNORMAL LOW (ref 3.5–5.0)
Alkaline Phosphatase: 163 U/L — ABNORMAL HIGH (ref 38–126)
Anion gap: 10 (ref 5–15)
BUN: 18 mg/dL (ref 8–23)
CO2: 27 mmol/L (ref 22–32)
Calcium: 8.5 mg/dL — ABNORMAL LOW (ref 8.9–10.3)
Chloride: 99 mmol/L (ref 98–111)
Creatinine, Ser: 0.87 mg/dL (ref 0.44–1.00)
GFR, Estimated: >60 mL/min (ref >60)
Glucose, Bld: 141 mg/dL — ABNORMAL HIGH (ref 70–99)
Potassium: 3.1 mmol/L — ABNORMAL LOW (ref 3.5–5.1)
Sodium: 136 mmol/L (ref 135–145)
Total Bilirubin: 0.7 mg/dL (ref 0.3–1.2)
Total Protein: 6.9 g/dL (ref 6.5–8.1)

## 2022-04-17 LAB — CBC
HCT: 25.6 % — ABNORMAL LOW (ref 36.0–46.0)
Hemoglobin: 8.3 g/dL — ABNORMAL LOW (ref 12.0–15.0)
MCH: 25.9 pg — ABNORMAL LOW (ref 26.0–34.0)
MCHC: 32.4 g/dL (ref 30.0–36.0)
MCV: 79.8 fL — ABNORMAL LOW (ref 80.0–100.0)
Platelets: 364 10*3/uL (ref 150–400)
RBC: 3.21 MIL/uL — ABNORMAL LOW (ref 3.87–5.11)
RDW: 14.9 % (ref 11.5–15.5)
WBC: 15.2 10*3/uL — ABNORMAL HIGH (ref 4.0–10.5)
nRBC: 0 % (ref 0.0–0.2)

## 2022-04-17 LAB — MAGNESIUM: Magnesium: 1.7 mg/dL (ref 1.7–2.4)

## 2022-04-17 LAB — BRAIN NATRIURETIC PEPTIDE: B Natriuretic Peptide: 236.1 pg/mL — ABNORMAL HIGH (ref 0.0–100.0)

## 2022-04-17 LAB — PROTIME-INR
INR: 1.3 — ABNORMAL HIGH (ref 0.8–1.2)
Prothrombin Time: 16.1 seconds — ABNORMAL HIGH (ref 11.4–15.2)

## 2022-04-17 LAB — RESP PANEL BY RT-PCR (FLU A&B, COVID) ARPGX2
Influenza A by PCR: NEGATIVE
Influenza B by PCR: NEGATIVE
SARS Coronavirus 2 by RT PCR: NEGATIVE

## 2022-04-17 LAB — TYPE AND SCREEN
ABO/RH(D): O POS
Antibody Screen: NEGATIVE

## 2022-04-17 MED ORDER — ALBUTEROL SULFATE (2.5 MG/3ML) 0.083% IN NEBU: 3.0000 mL | INHALATION_SOLUTION | RESPIRATORY_TRACT | Status: DC | PRN

## 2022-04-17 MED ORDER — MOMETASONE FURO-FORMOTEROL FUM 200-5 MCG/ACT IN AERO
2.0000 | INHALATION_SPRAY | Freq: Two times a day (BID) | RESPIRATORY_TRACT | Status: DC
  Administered 2022-04-18 – 2022-04-22 (×8): 2 via RESPIRATORY_TRACT
  Filled 2022-04-17 (×2): qty 8.8

## 2022-04-17 MED ORDER — ONDANSETRON HCL 4 MG/2ML IJ SOLN: 4.0000 mg | Freq: Three times a day (TID) | INTRAMUSCULAR | Status: DC | PRN

## 2022-04-17 MED ORDER — SODIUM CHLORIDE 0.9 % IV SOLN: Freq: Once | INTRAVENOUS | Status: DC

## 2022-04-17 MED ORDER — POTASSIUM CHLORIDE 10 MEQ/100ML IV SOLN
10.0000 meq | Freq: Once | INTRAVENOUS | Status: AC
  Administered 2022-04-17: 10 meq via INTRAVENOUS
  Filled 2022-04-17: qty 100

## 2022-04-17 MED ORDER — LEVETIRACETAM 750 MG PO TABS
750.0000 mg | ORAL_TABLET | Freq: Two times a day (BID) | ORAL | Status: DC
  Administered 2022-04-17 – 2022-04-22 (×11): 750 mg via ORAL
  Filled 2022-04-17 (×12): qty 1

## 2022-04-17 MED ORDER — POTASSIUM CHLORIDE 20 MEQ PO PACK
40.0000 meq | PACK | ORAL | Status: AC
  Administered 2022-04-17: 40 meq via ORAL
  Filled 2022-04-17: qty 2

## 2022-04-17 MED ORDER — EZETIMIBE 10 MG PO TABS
10.0000 mg | ORAL_TABLET | Freq: Every evening | ORAL | Status: DC
  Administered 2022-04-17 – 2022-04-21 (×5): 10 mg via ORAL
  Filled 2022-04-17 (×6): qty 1

## 2022-04-17 MED ORDER — PHENYTOIN SODIUM EXTENDED 100 MG PO CAPS
200.0000 mg | ORAL_CAPSULE | Freq: Every day | ORAL | Status: AC
  Administered 2022-04-17 – 2022-04-18 (×2): 200 mg via ORAL
  Filled 2022-04-17 (×3): qty 2

## 2022-04-17 MED ORDER — IOHEXOL 300 MG/ML  SOLN
100.0000 mL | Freq: Once | INTRAMUSCULAR | Status: AC | PRN
  Administered 2022-04-17: 100 mL via INTRAVENOUS

## 2022-04-17 MED ORDER — SENNOSIDES-DOCUSATE SODIUM 8.6-50 MG PO TABS
1.0000 | ORAL_TABLET | Freq: Two times a day (BID) | ORAL | Status: DC
  Administered 2022-04-17 – 2022-04-22 (×10): 1 via ORAL
  Filled 2022-04-17 (×11): qty 1

## 2022-04-17 MED ORDER — OXYCODONE HCL 5 MG PO TABS
2.5000 mg | ORAL_TABLET | Freq: Three times a day (TID) | ORAL | Status: DC | PRN
  Administered 2022-04-17: 2.5 mg via ORAL
  Filled 2022-04-17: qty 1

## 2022-04-17 MED ORDER — OXYCODONE HCL 5 MG PO TABS
2.5000 mg | ORAL_TABLET | Freq: Four times a day (QID) | ORAL | Status: DC | PRN
  Administered 2022-04-17 – 2022-04-19 (×3): 2.5 mg via ORAL
  Filled 2022-04-17 (×3): qty 1

## 2022-04-17 MED ORDER — MAGNESIUM OXIDE 400 MG PO TABS
800.0000 mg | ORAL_TABLET | Freq: Two times a day (BID) | ORAL | Status: DC
  Administered 2022-04-17 – 2022-04-22 (×11): 800 mg via ORAL
  Filled 2022-04-17 (×21): qty 2

## 2022-04-17 MED ORDER — POLYETHYLENE GLYCOL 3350 17 G PO PACK: 17.0000 g | PACK | Freq: Every day | ORAL | Status: DC | PRN

## 2022-04-17 MED ORDER — HYDRALAZINE HCL 20 MG/ML IJ SOLN
5.0000 mg | INTRAMUSCULAR | Status: DC | PRN
  Administered 2022-04-19: 5 mg via INTRAVENOUS
  Filled 2022-04-17: qty 1

## 2022-04-17 MED ORDER — LORAZEPAM 2 MG/ML IJ SOLN: 1.0000 mg | INTRAMUSCULAR | Status: DC | PRN

## 2022-04-17 MED ORDER — ASPIRIN 81 MG PO TBEC
81.0000 mg | DELAYED_RELEASE_TABLET | Freq: Every day | ORAL | Status: DC
  Administered 2022-04-17 – 2022-04-22 (×6): 81 mg via ORAL
  Filled 2022-04-17 (×6): qty 1

## 2022-04-17 NOTE — H&P (Signed)
History and Physical    Shirley Nichols XLK:[id number] DOB: 08-22-1940 DOA: 04/17/2022  Referring MD/NP/PA:   PCP: Remi Haggard, FNP   Patient coming from:  The patient is coming from home.  At baseline, pt is independent for most of ADL.        Chief Complaint: Generalized weakness  HPI: Shirley Nichols is a 81 y.o. female with medical history significant of hypertension, hyperlipidemia, COPD, skin cancer, recently diagnosed pancreatic cancer, s/p of biliary stent placement due to biliary obstruction, seizure, anemia, who presents with generalized weakness.  Patient was recently hospitalized from 10/26 - 11/3.  Patient was diagnosed with pancreatic cancer with biliary obstruction.  Patient is s/p of biliary stent placement.  Patient states that in the past several days, she has progressively worsening weakness. She has very poor appetite and decreased oral intake.  She has intermittent low-grade subjective fever, no chills.  Her temperature is 99.3 in ED.  Patient has nausea, no vomiting, diarrhea or abdominal pain.  Patient was found to have low oxygen saturation to 90s, started on 2 L oxygen with 97% saturation.  Patient has mild dry cough, mild shortness breath, no chest pain.  Denies symptoms of UTI.  No rectal bleeding.  Data reviewed independently and ED Course: pt was found to have BNP 236, INR 1.3, WBC 15.2, negative COVID PCR, potassium 3.1, renal function okay, abnormal liver function (ALP 163, AST 100, ALT 124, total bilirubin 0.7), UA not impressive. Temperature 99.3, blood pressure 153/87, heart rate 84, RR 17.  Chest x-ray showed cardiomegaly and interstitial pulmonary edema. Pending LE venous doppler. Pt is placed on telemetry bed for placement.  CT-abd/pelvis: 1. No acute findings or clear explanation for the patient's symptoms. 2. Stable appearance of the known pancreatic head mass with associated pancreatic ductal dilatation and pneumobilia following biliary stenting.  No evidence of metastatic disease. 3. Aortic Atherosclerosis (ICD10-I70.0).  LE venous doppler: 1. No evidence of right lower extremity DVT. 2. In the popliteal fossa there is a 4.5 x 1.4 x 2.8 cm heterogenous oval fluid collection, which could represent Baker's cyst with hemorrhage or synovitis.   EKG: I have personally reviewed.  Sinus rhythm, QTc 496, right bundle block 3, early progression  Review of Systems:   General: no fevers, chills, no body weight gain, has poor appetite, has fatigue HEENT: no blurry vision, hearing changes or sore throat Respiratory: has mild dyspnea, coughing, no wheezing CV: no chest pain, no palpitations GI: has nausea, no vomiting, abdominal pain, diarrhea, constipation GU: no dysuria, burning on urination, increased urinary frequency, hematuria  Ext: has mild leg edema Neuro: no unilateral weakness, numbness, or tingling, no vision change or hearing loss Skin: no rash, no skin tear. MSK: No muscle spasm, no deformity, no limitation of range of movement in spin Heme: No easy bruising.  Travel history: No recent long distant travel.   Allergy: No Known Allergies  Past Medical History:  Diagnosis Date   Cancer ([place])    skin cancer   Hyperlipidemia    Hypertension     Past Surgical History:  Procedure Laterality Date   ABDOMINAL HYSTERECTOMY     BILIARY BRUSHING  03/05/2022   Procedure: BILIARY BRUSHING;  Surgeon: Carol Ada, MD;  Location[facility] ENDOSCOPY;  Service: Gastroenterology;;   BILIARY STENT PLACEMENT  03/05/2022   Procedure: BILIARY STENT PLACEMENT;  Surgeon: Carol Ada, MD;  Location: [facility] ENDOSCOPY;  Service: Gastroenterology;;   COLONOSCOPY     02/21/2002 adenomatous polyp, 03/23/2011  COLONOSCOPY W/ POLYPECTOMY  02/2000   TA polyp removed from ascending colon.  performed through [facility] (? affiliate in [place])   COLONOSCOPY WITH PROPOFOL N/A 11/03/2016   Procedure: COLONOSCOPY WITH PROPOFOL;  Surgeon: Manya Silvas,  MD;  Location: [facility] ENDOSCOPY;  Service: Endoscopy;  Laterality: N/A;   ERCP N/A 03/05/2022   Procedure: ENDOSCOPIC RETROGRADE CHOLANGIOPANCREATOGRAPHY (ERCP);  Surgeon: Carol Ada, MD;  Location: [place];  Service: Gastroenterology;  Laterality: N/A;   HEMORROIDECTOMY     TONSILLECTOMY      Social History:  reports that she has quit smoking. She has never used smokeless tobacco. She reports that she does not drink alcohol and does not use drugs.  Family History:  Family History  Problem Relation Age of Onset   Seizures Sister      Prior to Admission medications   Medication Sig Start Date End Date Taking? Authorizing Provider  acetaminophen (TYLENOL) 325 MG tablet Take 2 tablets (650 mg total) by mouth every 6 (six) hours as needed for mild pain, fever or headache. 03/30/22   Angiulli, Lavon Paganini, PA-C  aspirin EC 81 MG tablet Take 81 mg by mouth daily.    [provider]  ezetimibe (ZETIA) 10 MG tablet Take 1 tablet (10 mg total) by mouth every evening. 04/06/22     fluvastatin XL (LESCOL XL) 80 MG 24 hr tablet Take 1 tablet (80 mg total) by mouth daily. 03/30/22   Angiulli, Lavon Paganini, PA-C  fluvastatin XL (LESCOL XL) 80 MG 24 hr tablet Take 1 tablet (80 mg total) by mouth daily. 04/06/22     levETIRAcetam (KEPPRA) 750 MG tablet Take 1 tablet (750 mg total) by mouth 2 (two) times daily. 04/06/22     magnesium oxide (MAG-OX) 400 MG tablet Take 2 tablets (800 mg total) by mouth 2 (two) times daily. 03/30/22   Angiulli, Lavon Paganini, PA-C  ondansetron (ZOFRAN) 4 MG tablet Take 1 tablet (4 mg total) by mouth every 8 (eight) hours as needed for nausea or vomiting. 04/11/22   Borders, Kirt Boys, NP  oxyCODONE (OXY IR/ROXICODONE) 5 MG immediate release tablet Take 1/2 tablet (2.5 mg total) by mouth every 8 (eight) hours as needed for moderate pain or severe pain. 03/30/22   Angiulli, Lavon Paganini, PA-C  phenytoin (DILANTIN) 300 MG ER capsule Take 1 capsule (300 mg total) by mouth at bedtime  04/06/22     polyethylene glycol (MIRALAX / GLYCOLAX) 17 g packet Take 17 g by mouth daily. 03/30/22   Angiulli, Lavon Paganini, PA-C  senna-docusate (SENOKOT-S) 8.6-50 MG tablet Take 1 tablet by mouth 2 (two) times daily. 03/30/22   Angiulli, Lavon Paganini, PA-C  SYMBICORT 160-4.5 MCG/ACT inhaler Inhale 2 puffs into the lungs as needed (for wheezing or shortness of breath). 03/30/22   Cathlyn Parsons, PA-C    Physical Exam: Vitals:   04/17/22 1600 04/17/22 1647 04/17/22 1700 04/17/22 1733  BP: (!) 155/59 (!) 162/72 (!) 169/72 (!) 145/67  Pulse: 81 83 90 83  Resp: 20 18 (!) 25 (!) 22  Temp:  98.4 F (36.9 C)    TempSrc:  Oral    SpO2: 97% 98% 96% 97%  Weight:      Height:       General: Not in acute distress HEENT:       Eyes: PERRL, EOMI, no scleral icterus.       ENT: No discharge from the ears and nose, no pharynx injection, no tonsillar enlargement.  Neck: No JVD, no bruit, no mass felt. Heme: No neck lymph node enlargement. Cardiac: S1/S2, RRR, No murmurs, No gallops or rubs. Respiratory: No rales, wheezing, rhonchi or rubs. GI: Soft, nondistended, nontender, no rebound pain, no organomegaly, BS present. GU: No hematuria Ext: has trace leg edema bilaterally. 1+DP/PT pulse bilaterally. Musculoskeletal: No joint deformities, No joint redness or warmth, no limitation of ROM in spin. Skin: No rashes.  Neuro: Alert, oriented X3, cranial nerves II-XII grossly intact, moves all extremities. Psych: Patient is not psychotic, no suicidal or hemocidal ideation.  Labs on Admission: I have personally reviewed following labs and imaging studies  CBC: Recent Labs  Lab 04/11/22 1351 04/14/22 1323 04/17/22 0913  WBC 10.3 10.5 15.2*  NEUTROABS 7.1 7.1  --   HGB 8.4* 7.9* 8.3*  HCT 26.6* 24.6* 25.6*  MCV 83.1 81.5 79.8*  PLT 347 327 992   Basic Metabolic Panel: Recent Labs  Lab 04/11/22 1351 04/14/22 1323 04/17/22 0913  NA 134* 132* 136  K 3.6 3.7 3.1*  CL 96* 94* 99  CO2 '28 27  27  '$ GLUCOSE 204* 132* 141*  BUN 24* 23 18  CREATININE 1.05* 0.84 0.87  CALCIUM 8.7* 8.7* 8.5*  MG 1.9  --  1.7   GFR: Estimated Creatinine Clearance: 43.1 mL/min (by C-G formula based on SCr of 0.87 mg/dL). Liver Function Tests: Recent Labs  Lab 04/11/22 1351 04/14/22 1323 04/17/22 0913  AST 50* 71* 100*  ALT 59* 84* 124*  ALKPHOS 138* 132* 163*  BILITOT 0.4 0.4 0.7  PROT 7.0 6.9 6.9  ALBUMIN 2.8* 2.6* 2.4*   No results for input(s): "LIPASE", "AMYLASE" in the last 168 hours. No results for input(s): "AMMONIA" in the last 168 hours. Coagulation Profile: Recent Labs  Lab 04/17/22 0913  INR 1.3*   Cardiac Enzymes: No results for input(s): "CKTOTAL", "CKMB", "CKMBINDEX", "TROPONINI" in the last 168 hours. BNP (last 3 results) No results for input(s): "PROBNP" in the last 8760 hours. HbA1C: No results for input(s): "HGBA1C" in the last 72 hours. CBG: No results for input(s): "GLUCAP" in the last 168 hours. Lipid Profile: No results for input(s): "CHOL", "HDL", "LDLCALC", "TRIG", "CHOLHDL", "LDLDIRECT" in the last 72 hours. Thyroid Function Tests: No results for input(s): "TSH", "T4TOTAL", "FREET4", "T3FREE", "THYROIDAB" in the last 72 hours. Anemia Panel: No results for input(s): "VITAMINB12", "FOLATE", "FERRITIN", "TIBC", "IRON", "RETICCTPCT" in the last 72 hours.  Urine analysis:    Component Value Date/Time   COLORURINE YELLOW (A) 04/17/2022 1145   APPEARANCEUR CLEAR (A) 04/17/2022 1145   LABSPEC 1.017 04/17/2022 1145   PHURINE 6.0 04/17/2022 1145   GLUCOSEU NEGATIVE 04/17/2022 1145   HGBUR NEGATIVE 04/17/2022 1145   BILIRUBINUR NEGATIVE 04/17/2022 1145   KETONESUR NEGATIVE 04/17/2022 1145   PROTEINUR 100 (A) 04/17/2022 1145   NITRITE NEGATIVE 04/17/2022 1145   LEUKOCYTESUR NEGATIVE 04/17/2022 1145   Sepsis Labs: '@LABRCNTIP'$ (procalcitonin:4,lacticidven:4) ) Recent Results (from the past 240 hour(s))  Resp Panel by RT-PCR (Flu A&B, Covid) Anterior Nasal  Swab     Status: None   Collection Time: 04/17/22  9:13 AM   Specimen: Anterior Nasal Swab  Result Value Ref Range Status   SARS Coronavirus 2 by RT PCR NEGATIVE NEGATIVE Final    Comment: (NOTE) SARS-CoV-2 target nucleic acids are NOT DETECTED.  The SARS-CoV-2 RNA is generally detectable in upper respiratory specimens during the acute phase of infection. The lowest concentration of SARS-CoV-2 viral copies this assay can detect is 138 copies/mL. A negative result does not  preclude SARS-Cov-2 infection and should not be used as the sole basis for treatment or other patient management decisions. A negative result may occur with  improper specimen collection/handling, submission of specimen other than nasopharyngeal swab, presence of viral mutation(s) within the areas targeted by this assay, and inadequate number of viral copies(<138 copies/mL). A negative result must be combined with clinical observations, patient history, and epidemiological information. The expected result is Negative.  Fact Sheet for Patients:  EntrepreneurPulse.com.au  Fact Sheet for Healthcare Providers:  IncredibleEmployment.be  This test is no t yet approved or cleared by the [place] FDA and  has been authorized for detection and/or diagnosis of SARS-CoV-2 by FDA under an Emergency Use Authorization (EUA). This EUA will remain  in effect (meaning this test can be used) for the duration of the COVID-19 declaration under Section 564(b)(1) of the Act, 21 U.S.C.section 360bbb-3(b)(1), unless the authorization is terminated  or revoked sooner.       Influenza A by PCR NEGATIVE NEGATIVE Final   Influenza B by PCR NEGATIVE NEGATIVE Final    Comment: (NOTE) The Xpert Xpress SARS-CoV-2/FLU/RSV plus assay is intended as an aid in the diagnosis of influenza from Nasopharyngeal swab specimens and should not be used as a sole basis for treatment. Nasal washings and aspirates  are unacceptable for Xpert Xpress SARS-CoV-2/FLU/RSV testing.  Fact Sheet for Patients: EntrepreneurPulse.com.au  Fact Sheet for Healthcare Providers: IncredibleEmployment.be  This test is not yet approved or cleared by the [place] FDA and has been authorized for detection and/or diagnosis of SARS-CoV-2 by FDA under an Emergency Use Authorization (EUA). This EUA will remain in effect (meaning this test can be used) for the duration of the COVID-19 declaration under Section 564(b)(1) of the Act, 21 U.S.C. section 360bbb-3(b)(1), unless the authorization is terminated or revoked.  Performed at [facility], [address] [place], [place] [address]      Radiological Exams on Admission: CT ABDOMEN PELVIS W CONTRAST  Result Date: 04/17/2022 CLINICAL DATA:  Recently diagnosed pancreatic cancer. Recent sphincterotomy and biliary stent placement. Progressive weakness. * Tracking Code: BO * EXAM: CT ABDOMEN AND PELVIS WITH CONTRAST TECHNIQUE: Multidetector CT imaging of the abdomen and pelvis was performed using the standard protocol following bolus administration of intravenous contrast. RADIATION DOSE REDUCTION: This exam was performed according to the departmental dose-optimization program which includes automated exposure control, adjustment of the mA and/or kV according to patient size and/or use of iterative reconstruction technique. CONTRAST:  162m OMNIPAQUE IOHEXOL 300 MG/ML  SOLN COMPARISON:  Abdominopelvic CT 04/05/2022. Abdominal MRI 03/03/2022. FINDINGS: Lower chest: Patchy atelectasis at both lung bases. No pleural effusion or suspicious pulmonary nodule. There is atherosclerosis of the aorta and coronary arteries. Hepatobiliary: Metallic biliary stent remains in place with persistent pneumobilia. The gallbladder is decompressed, without wall thickening or stones. Unchanged appearance of the liver without suspicious abnormality.  Pancreas: Known hypodense mass involving the pancreatic head is similar to previous study, measuring approximately 2.1 x 1.3 cm on image 29/3. Stable pancreatic ductal dilatation and mild surrounding inflammation. Spleen: Normal in size without focal abnormality. Adrenals/Urinary Tract: Unchanged 2.2 cm left adrenal nodule which was previously characterized by noncontrast CT as an adenoma. No follow-up imaging recommended. The right adrenal gland appears normal. No evidence of urinary tract calculus, suspicious renal lesion or hydronephrosis. Unchanged bilateral renal cysts; no follow-up imaging recommended. The bladder appears unremarkable for its degree of distention. Stomach/Bowel: No enteric contrast administered. The stomach appears unremarkable for its degree of  distension. No evidence of bowel wall thickening, distention or surrounding inflammatory change. Vascular/Lymphatic: There are no enlarged abdominal or pelvic lymph nodes. Diffuse aortic and branch vessel atherosclerosis without evidence of aneurysm or large vessel occlusion. No gross vascular encasement identified in the porta hepatis. Reproductive: Status post hysterectomy.  No adnexal mass. Other: No ascites, free air or peritoneal nodularity. Previously noted small subcutaneous nodule within the left anterior abdominal wall has slightly decreased in size and may be related to previous subcutaneous injection. Musculoskeletal: No acute or significant osseous findings. Facet hypertrophy in the lower lumbar spine with a degenerative grade 1 anterolisthesis at L4-5. IMPRESSION: 1. No acute findings or clear explanation for the patient's symptoms. 2. Stable appearance of the known pancreatic head mass with associated pancreatic ductal dilatation and pneumobilia following biliary stenting. No evidence of metastatic disease. 3. Aortic Atherosclerosis (ICD10-I70.0). Electronically Signed   By: Richardean Sale M.D.   On: 04/17/2022 16:43   US Venous Img  Lower Unilateral Right  Result Date: 04/17/2022 CLINICAL DATA:  Swelling EXAM: Right LOWER EXTREMITY VENOUS DOPPLER ULTRASOUND TECHNIQUE: Gray-scale sonography with compression, as well as color and duplex ultrasound, were performed to evaluate the deep venous system(s) from the level of the common femoral vein through the popliteal and proximal calf veins. COMPARISON:  None Available. FINDINGS: VENOUS Normal compressibility of the common femoral, superficial femoral, and popliteal veins, as well as the visualized calf veins. Visualized portions of profunda femoral vein and great saphenous vein unremarkable. No filling defects to suggest DVT on grayscale or color Doppler imaging. Doppler waveforms show normal direction of venous flow, normal respiratory plasticity and response to augmentation. OTHER In the popliteal fossa there is a 4.5 x 1.4 x 2.8 cm heterogenous oval fluid collection. There is no flow within or around this collection. This is incompletely assessed on the provided images. Limitations: none IMPRESSION: 1. No evidence of right lower extremity DVT. 2. In the popliteal fossa there is a 4.5 x 1.4 x 2.8 cm heterogenous oval fluid collection, which could represent Baker's cyst with hemorrhage or synovitis. Electronically Signed   By: Marin Roberts M.D.   On: 04/17/2022 10:26   DG Chest Portable 1 View  Result Date: 04/17/2022 CLINICAL DATA:  Weakness and fatigue in a female at age 73. EXAM: PORTABLE CHEST 1 VIEW COMPARISON:  March 14, 2022. FINDINGS: EKG leads project over the chest. Cardiomediastinal contours and hilar structures with persistent cardiac enlargement. Increased interstitial markings improved compared to most recent comparison imaging. No sign of lobar consolidative process. No pneumothorax. On limited assessment no acute skeletal findings. IMPRESSION: Cardiomegaly with improved interstitial markings compared to most recent comparison imaging. No sign of lobar consolidative  process. Difficult to exclude mild interstitial edema, difficult to differentiate from background chronic changes. Electronically Signed   By: Zetta Bills M.D.   On: 04/17/2022 09:34      Assessment/Plan Principal Problem:   General weakness Active Problems:   Leukocytosis   Interstitial edema_pulmonary interstial edema   Pancreatic cancer (HCC)   Malignant obstructive jaundice (HCC)   Hypertension   Hyperlipidemia   Hypokalemia   Seizure (HCC)   Elevated LFTs   Microcytic anemia   Palliative care encounter   COPD (chronic obstructive pulmonary disease) (HCC)   Malnutrition of moderate degree   Assessment and Plan:  General weakness: Etiology is not clear.  Likely multifactorial etiology.  No source of infection identified.  Chest x-ray negative for infiltration.  Urinalysis not impressive. -Placed on telemetry bed for  observation -Fall precaution -PT/OT  Leukocytosis: WBC 15.2, temperature 99.3.  No source of infection identified so far. -f/u Bx and Ux  Interstitial edema_pulmonary interstial edema: Patient has mild oxygen desaturation to 90%, needed 2 L oxygen now.  BNP elevated to 36.  Chest x-ray showed interstitial pulmonary edema. -Will get 2D echo to see if patient has a CHF  Pancreatic cancer ([place]) -Consulted with Dr. Gurney Maxin of oncology  Malignant obstructive jaundice [facility]): S/p for stent placement.   -Repeated CT scan of abdomen/pelvis is negative for acute issues.  Hypertension -IV hydralazine as needed  Hyperlipidemia -Zetia -hold off of statin due to abnormal liver function  Hypokalemia: Potassium 3.1 -Repleted potassium -Check magnesium level  Seizure (HCC) -Seizure protocol -As needed Ativan for seizure -Continue Keppra and Dilantin  Elevated LFTs: This is likely due to biliary obstruction -Avoid using Tylenol  Microcytic anemia: Hemoglobin 8.3, at baseline.  Recent baseline hemoglobin 7-8. -Follow-up with CBC  Palliative care  encounter  COPD (chronic obstructive pulmonary disease) ([place]): Stable -Bronchodilators  Malnutrition of moderate degree: Body weight 55.8 kg, BMI 21.11 -Nutrition consult     DVT ppx: SCD  Code Status: DNR per pt and her husband  Family Communication:     Yes, patient's husband  at bed side.   Disposition Plan:  Anticipate discharge back to previous environment  Consults called:  none  Admission status and Level of care: Telemetry Cardiac:     as inpt      Dispo: The patient is from: Home              Anticipated d/c is to: Home              Anticipated d/c date is: 1 day              Patient currently is not medically stable to d/c.    Severity of Illness:  The appropriate patient status for this patient is OBSERVATION. Observation status is judged to be reasonable and necessary in order to provide the required intensity of service to ensure the patient's safety. The patient's presenting symptoms, physical exam findings, and initial radiographic and laboratory data in the context of their medical condition is felt to place them at decreased risk for further clinical deterioration. Furthermore, it is anticipated that the patient will be medically stable for discharge from the hospital within 2 midnights of admission.        Date of Service 04/17/2022    [facility]   If 7PM-7AM, please contact night-coverage [email or web address] 04/17/2022, 7:03 PM

## 2022-04-17 NOTE — ED Provider Notes (Signed)
Per rad tech, ultrasound is negative for DVT with probable baker's cyst (rads report not coming through electronically)   Delman Kitten, MD 04/17/22 1314

## 2022-04-17 NOTE — ED Notes (Signed)
Pt in bed, sig other at bedside, blood culture number one drawn during iv start in R forearm, blood culture number two drawn via 21 gauge butterfly from L hand, samples obtained and sent to lab.

## 2022-04-17 NOTE — Consult Note (Signed)
[place] at [facility] [phone]) [phone] [phone]) [phone]   Name: Shirley Nichols Date: 04/17/2022 MRN: [id number]  DOB: 1940-09-21  Patient Care Team: Remi Haggard, FNP as PCP - General (Family Medicine) Clent Jacks, RN as Oncology Nurse Navigator    REASON FOR CONSULTATION: Shirley Nichols is a 81 y.o. female with multiple medical problems including newly diagnosed pancreatic cancer.  Patient was hospitalized 03/03/2022 to 03/31/2022 for evaluation of elevated LFTs. MRCP revealed a localized mass in the head of the pancreas with CBD stricture and diffuse biliary ductal dilation.  Patient was transferred to [facility] and underwent ERCP on 03/05/2022 with sphincterotomy and biliary stent placement.  Brush biopsy positive for pancreatic adenocarcinoma.  Hospitalization was also complicated by metabolic encephalopathy and questionable seizures requiring intubation.  Patient has been evaluated by hepatobiliary surgeon but unclear if she is a surgical candidate.  She has poor performance status and is not a candidate for aggressive chemotherapy but plan is to initiate single agent gemcitabine.  Patient was sent to the ED by family on 04/17/2022 for progressive weakness.  Palliative care was consulted to help address goals.  SOCIAL HISTORY:     reports that she has quit smoking. She has never used smokeless tobacco. She reports that she does not drink alcohol and does not use drugs.  Patient is married lives at home with her husband.  ADVANCE DIRECTIVES:  None on file  CODE STATUS: DNR  PAST MEDICAL HISTORY: Past Medical History:  Diagnosis Date   Cancer ([place])    skin cancer   Hyperlipidemia    Hypertension     PAST SURGICAL HISTORY:  Past Surgical History:  Procedure Laterality Date   ABDOMINAL HYSTERECTOMY     BILIARY BRUSHING  03/05/2022   Procedure: BILIARY BRUSHING;  Surgeon: Carol Ada, MD;  Location: [facility]  ENDOSCOPY;  Service: Gastroenterology;;   BILIARY STENT PLACEMENT  03/05/2022   Procedure: BILIARY STENT PLACEMENT;  Surgeon: Carol Ada, MD;  Location: [facility] ENDOSCOPY;  Service: Gastroenterology;;   COLONOSCOPY     02/21/2002 adenomatous polyp, 03/23/2011   COLONOSCOPY W/ POLYPECTOMY  02/2000   TA polyp removed from ascending colon.  performed through [facility] (? affiliate in [place])   COLONOSCOPY WITH PROPOFOL N/A 11/03/2016   Procedure: COLONOSCOPY WITH PROPOFOL;  Surgeon: Manya Silvas, MD;  Location: [facility] ENDOSCOPY;  Service: Endoscopy;  Laterality: N/A;   ERCP N/A 03/05/2022   Procedure: ENDOSCOPIC RETROGRADE CHOLANGIOPANCREATOGRAPHY (ERCP);  Surgeon: Carol Ada, MD;  Location: [place];  Service: Gastroenterology;  Laterality: N/A;   HEMORROIDECTOMY     TONSILLECTOMY      HEMATOLOGY/ONCOLOGY HISTORY:  Oncology History  Pancreatic cancer ([place])  03/03/2022 Initial Diagnosis   Pancreatic cancer ([place])   03/03/2022 Imaging   CT abdomen and pelvis  Hazy stranding about the pancreatic head suggestive of pancreatitis. This is incompletely evaluated without IV contrast. Dilated gallbladder and common bile duct. MRI/MRCP is recommended to evaluate for choledocholithiasis or obstructing mass.   03/03/2022 Imaging   MRI  Image degradation by motion artifact noted.   3.4 cm masslike density in the pancreatic head, highly suspicious for pancreatic carcinoma.    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 No evidence of choledocholithiasis.   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   03/05/2022 Pathology Results   FINAL MICROSCOPIC DIAGNOSIS:  - Malignant cells consistent with  adenocarcinoma    03/05/2022 Procedure   ERCP - The major papilla appeared normal. - The upper third of the main bile duct and middle third of the main bile duct were dilated, with a mass  causing an obstruction. - A biliary sphincterotomy was performed. - One covered metal stent was placed into the common bile duct. - Cells for cytology obtained in the lower third of the main duct.   03/09/2022 Cancer Staging   Staging form: Exocrine Pancreas, AJCC 8th Edition - Clinical stage from 03/09/2022: Stage IB (cT2, cN0, cM0) - Signed by Heath Lark, MD on 03/09/2022 Stage prefix: Initial diagnosis Total positive nodes: 0     ALLERGIES:  has No Known Allergies.  MEDICATIONS:  Current Facility-Administered Medications  Medication Dose Route Frequency Provider Last Rate Last Admin   albuterol (PROVENTIL) (2.5 MG/3ML) 0.083% nebulizer solution 3 mL  3 mL Inhalation Q4H PRN Ivor Costa, MD       hydrALAZINE (APRESOLINE) injection 5 mg  5 mg Intravenous Q2H PRN Ivor Costa, MD       LORazepam (ATIVAN) injection 1 mg  1 mg Intravenous Q2H PRN Ivor Costa, MD       ondansetron [facility]) injection 4 mg  4 mg Intravenous Q8H PRN Ivor Costa, MD       Current Outpatient Medications  Medication Sig Dispense Refill   aspirin EC 81 MG tablet Take 81 mg by mouth daily.     ezetimibe (ZETIA) 10 MG tablet Take 1 tablet (10 mg total) by mouth every evening. 90 tablet 3   fluvastatin XL (LESCOL XL) 80 MG 24 hr tablet Take 1 tablet (80 mg total) by mouth daily. 90 tablet 3   levETIRAcetam (KEPPRA) 750 MG tablet Take 1 tablet (750 mg total) by mouth 2 (two) times daily. 180 tablet 3   magnesium oxide (MAG-OX) 400 MG tablet Take 2 tablets (800 mg total) by mouth 2 (two) times daily. 120 tablet 0   phenytoin (DILANTIN) 300 MG ER capsule Take 1 capsule (300 mg total) by mouth at bedtime (Patient taking differently: Take 200 mg by mouth at bedtime.) 90 capsule 3   senna-docusate (SENOKOT-S) 8.6-50 MG tablet Take 1 tablet by mouth 2 (two) times daily.     acetaminophen (TYLENOL) 325 MG tablet Take 2 tablets (650 mg total) by mouth every 6 (six) hours as needed for mild pain, fever or headache.      fluvastatin XL (LESCOL XL) 80 MG 24 hr tablet Take 1 tablet (80 mg total) by mouth daily. (Patient not taking: Reported on 04/17/2022) 30 tablet 0   ondansetron (ZOFRAN) 4 MG tablet Take 1 tablet (4 mg total) by mouth every 8 (eight) hours as needed for nausea or vomiting. 20 tablet 0   oxyCODONE (OXY IR/ROXICODONE) 5 MG immediate release tablet Take 1/2 tablet (2.5 mg total) by mouth every 8 (eight) hours as needed for moderate pain or severe pain. 10 tablet 0   polyethylene glycol (MIRALAX / GLYCOLAX) 17 g packet Take 17 g by mouth daily. 14 each 0   SYMBICORT 160-4.5 MCG/ACT inhaler Inhale 2 puffs into the lungs as needed (for wheezing or shortness of breath). 10.2 g 4    VITAL SIGNS: BP (!) 150/60   Pulse 87   Temp 99.4 F (37.4 C) (Oral)   Resp 15   Ht _0  (1.626 m)   Wt 123 lb (55.8 kg)   SpO2 97%   BMI 21.11 kg/m  Filed Weights   04/17/22 0856  Weight: 123 lb (55.8 kg)    Estimated body mass index is 21.11 kg/m as calculated from the following:   Height as of this encounter: _0  (1.626 m).   Weight as of this encounter: 123 lb (55.8 kg).  LABS: CBC:    Component Value Date/Time   WBC 15.2 (H) 04/17/2022 0913   HGB 8.3 (L) 04/17/2022 0913   HCT 25.6 (L) 04/17/2022 0913   PLT 364 04/17/2022 0913   MCV 79.8 (L) 04/17/2022 0913   NEUTROABS 7.1 04/14/2022 1323   LYMPHSABS 2.4 04/14/2022 1323   MONOABS 0.7 04/14/2022 1323   EOSABS 0.2 04/14/2022 1323   BASOSABS 0.1 04/14/2022 1323   Comprehensive Metabolic Panel:    Component Value Date/Time   NA 136 04/17/2022 0913   K 3.1 (L) 04/17/2022 0913   CL 99 04/17/2022 0913   CO2 27 04/17/2022 0913   BUN 18 04/17/2022 0913   CREATININE 0.87 04/17/2022 0913   GLUCOSE 141 (H) 04/17/2022 0913   CALCIUM 8.5 (L) 04/17/2022 0913   AST 100 (H) 04/17/2022 0913   ALT 124 (H) 04/17/2022 0913   ALKPHOS 163 (H) 04/17/2022 0913   BILITOT 0.7 04/17/2022 0913   PROT 6.9 04/17/2022 0913   ALBUMIN 2.4 (L) 04/17/2022 0913     RADIOGRAPHIC STUDIES: CT ABD PELVIS W/WO CM ONCOLOGY PANCREATIC PROTOCOL  Result Date: 04/07/2022 CLINICAL DATA:  Pancreatic head adenocarcinoma diagnosed on 03/05/2022 ERCP. Staging. * Tracking Code: BO * EXAM: CT ABDOMEN AND PELVIS WITHOUT AND WITH CONTRAST TECHNIQUE: Multidetector CT imaging of the abdomen and pelvis was performed following the standard protocol before and following the bolus administration of intravenous contrast. RADIATION DOSE REDUCTION: This exam was performed according to the departmental dose-optimization program which includes automated exposure control, adjustment of the mA and/or kV according to patient size and/or use of iterative reconstruction technique. CONTRAST:  64m OMNIPAQUE IOHEXOL 300 MG/ML  SOLN COMPARISON:  03/03/2022 MRI abdomen and unenhanced CT abdomen/pelvis FINDINGS: Lower chest: No significant pulmonary nodules or acute consolidative airspace disease. Coronary atherosclerosis. Hepatobiliary: Normal liver size. Subcapsular hypodense 0.8 cm far inferior right liver lesion (series 9/image 57), too small to characterize, stable from 03/03/2022 MRI abdomen study, where it was T2 hyperintense and compatible with a tiny benign cyst. No additional liver lesions. Metallic CBD stent in place extending from the mid CBD to the descending duodenal lumen. Pneumobilia throughout the intrahepatic bile ducts and nondistended gallbladder indicating stent patency. Proximal CBD diameter 7 mm. Mild central intrahepatic biliary ductal dilatation. Pancreas: Poorly defined hypoenhancing 2.9 x 2.4 cm pancreatic head mass (series 9/image 47), previously 2.9 x 2.3 cm on 03/03/2022 MRI using similar measurement technique, not appreciably changed. No clear tumor involvement of the SMV, portal veins or SMA. Dilated main pancreatic duct (6 mm diameter) with abrupt caliber transition at the level of the pancreatic head. Diffuse haziness of the peripancreatic fat without measurable  peripancreatic fluid collections. No evidence of pancreatic parenchymal necrosis. Tiny 1.2 x 0.6 cm pancreatic body pseudocyst (series 9/image 44), slightly increased from 0.8 x 0.4 cm on 03/03/2022 MRI. Spleen: Normal size. No mass. Adrenals/Urinary Tract: Left adrenal 2.2 x 1.8 cm nodule with precontrast density 4 HU, compatible with an adenoma. Mildly irregular right adrenal gland is unchanged, without discrete right adrenal nodules. No hydronephrosis. Small simple bilateral renal cysts, largest 1.4 cm in the anterior upper right kidney. No suspicious renal masses. Normal bladder. Stomach/Bowel: Small hiatal hernia. Otherwise normal nondistended stomach. Normal caliber small bowel with no small bowel  wall thickening. Appendix not discretely visualized. Normal large bowel with no diverticulosis, large bowel wall thickening or pericolonic fat stranding. Vascular/Lymphatic: Atherosclerotic nonaneurysmal abdominal aorta. Patent portal, splenic, hepatic and renal veins. No pathologically enlarged lymph nodes in the abdomen or pelvis. Reproductive: Status post hysterectomy, with no abnormal findings at the vaginal cuff. No adnexal mass. Other: No pneumoperitoneum, ascites or focal fluid collection. Nonspecific subcutaneous 0.8 cm ventral left abdominal wall soft tissue density nodule (series 9/image 85), new since 03/03/2022 CT. Musculoskeletal: No aggressive appearing focal osseous lesions. Mild thoracolumbar spondylosis. IMPRESSION: 1. Poorly defined hypoenhancing 2.9 cm pancreatic head mass, not appreciably changed since 03/03/2022 MRI, compatible with known primary pancreatic adenocarcinoma. No evidence of vascular invasion by the pancreatic tumor. 2. Dilated main pancreatic duct with abrupt caliber transition at the level of the pancreatic head. Well-positioned CBD stent with pneumobilia indicating stent patency. 3. No lymphadenopathy or other findings highly suspicious for metastatic disease in the abdomen or  pelvis. Nonspecific subcutaneous 0.8 cm ventral left abdominal wall soft tissue density nodule, new since 03/03/2022 CT, most commonly due to subcutaneous injections. 4. Evidence of ongoing pancreatitis. Tiny 1.2 x 0.6 cm pancreatic body pseudocyst, slightly increased. 5. 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 6. Small hiatal hernia. 7. Coronary atherosclerosis. 8.  Aortic Atherosclerosis (ICD10-I70.0). Electronically Signed   By: Ilona Sorrel M.D.   On: 04/07/2022 16:08    PERFORMANCE STATUS (ECOG) : 4 - Bedbound  Review of Systems Unless otherwise noted, a complete review of systems is negative.  Physical Exam General: NAD Pulmonary: Unlabored Extremities: no edema, no joint deformities Skin: no rashes Neurological: Weakness but otherwise nonfocal  IMPRESSION: Patient seen in the ED.  I met with patient's husband privately to discuss goals.  He voiced concerns regarding patient's progressive decline since her previous hospitalization.  He attributed some of this to patient's anticonvulsant medications.  Patient was subsequently evaluated by neurology with plan to taper off Dilantin.  Husband is hopeful that hospitalization will allow patient to improve and perhaps return to her previous functional baseline.  However, he says he recognizes that it is possible that she might continue to decline.  We discussed the importance of performance status as a criteria determining whether patient could tolerate or be a candidate for cancer treatment.  At this point, it does not sound like patient is a surgical candidate and it is possible that she is also not a candidate for systemic chemotherapy.  We discussed the option of keeping patient comfortable and even possibly involving hospice in her care.  Husband says that their priority is allowing patient to live out her days in the most comfort, with the highest quality of life possible.  Patient does not have ACP documents on  file but husband says that they have previously completed advance directives and discussed end-of-life wishes.  Per husband, patient would not want to be resuscitated or have her life prolonged artificially on machines and he viewed such care as potentially causing patient to suffer at end-of-life.  He was in agreement with DNR/DNI.  PLAN: -Continue current scope of treatment -DNR/DNI -Consider CT of the abdomen and pelvis for evaluation of biliary stent given rising LFTs -Will follow and continue conversations regarding goals  Case and plan discussed with Dr. Darrall Dears  Time Total: 60 minutes  Visit consisted of counseling and education dealing with the complex and emotionally intense issues of symptom management and palliative care in the setting of serious and potentially life-threatening illness.Greater  than 50%  of this time was spent counseling and coordinating care related to the above assessment and plan.  Signed by: Altha Harm, PhD, NP-C

## 2022-04-17 NOTE — ED Notes (Signed)
Pt in bed, pt reports general weakness, pt is pale, skin in warm and dry, pt reports some pain and swelling in her R lower leg, states that last night she slept in a recliner because it was easier to breath.  Pt on heart and O2 sat monitor.

## 2022-04-17 NOTE — ED Notes (Signed)
Lab called and type and screen was hemolyzed, md states that we don't need to re collect, pt is not getting a transfusion at this time. Pt states that she will not be able to get to the toilet or out of bed, md notified, in and out cath for urine sample.  Mendel Ryder rn at bedside to observe and ensure sterile technique, pt tolerated well.  Urine obtained and sent to lab.

## 2022-04-17 NOTE — ED Notes (Signed)
Pt in bed, pt reports some discomfort with K gtt, md notified, order for ns to dilute K infusion.

## 2022-04-17 NOTE — ED Triage Notes (Signed)
Pt to er room number 15 via ems, per ems pt was recently dx with pancreatic cancer, states that her hemoglobin was 7.9 and is generally weak, states that they are here for a possible blood transfusion.

## 2022-04-17 NOTE — Consult Note (Addendum)
[facility]  [phone]) [phone] [phone]  ID: [name] OB: Dec 23, 1940  MR#: [id number]  CHY#:[id number]  Patient Care Team: Remi Haggard, FNP as PCP - General (Family Medicine) Clent Jacks, RN as Oncology Nurse Navigator  REFERRING PROVIDER: Dr. Jacqualine Code  REASON FOR REFERRAL: pancreatic cancer  HPI: Shirley Nichols is a 81 y.o. female with recent diagnosis of localized pancreatic cancer presented to ED on 04/17/2022 for worsening generalized weakness.  Patient has reduced appetite and very low energy levels.  Patient denies any fevers, chills, vomiting or abdominal pain.  For few days she has been having right shoulder and right feet pain.  On admission, CBC showed WBC 15.2, hemoglobin 8.3 and platelet 364.  Potassium 3.1, albumin 2.4, AST 100, ALT 124, ALP 163 and total bilirubin normal.  She had CT abdomen pelvis which showed a stable pancreatic mass with associated pancreatic duct dilatation and metallic stent in place.  Venous Doppler showed fluid collection in the popliteal fossa possibly Baker's cyst.  Patient recently had prolonged hospital course end of October 2023 for biliary obstruction status post sphincterotomy and metal stent placement, complicated by metabolic encephalopathy with concern for seizure required intubation.    REVIEW OF SYSTEMS:   ROS  As per HPI. Otherwise, a complete review of systems is negative.  PAST MEDICAL HISTORY: Past Medical History:  Diagnosis Date   Cancer ([place])    skin cancer   Hyperlipidemia    Hypertension     PAST SURGICAL HISTORY: Past Surgical History:  Procedure Laterality Date   ABDOMINAL HYSTERECTOMY     BILIARY BRUSHING  03/05/2022   Procedure: BILIARY BRUSHING;  Surgeon: Carol Ada, MD;  Location: [facility] ENDOSCOPY;  Service: Gastroenterology;;   BILIARY STENT PLACEMENT  03/05/2022   Procedure: BILIARY STENT PLACEMENT;  Surgeon: Carol Ada, MD;  Location: [facility] ENDOSCOPY;   Service: Gastroenterology;;   COLONOSCOPY     02/21/2002 adenomatous polyp, 03/23/2011   COLONOSCOPY W/ POLYPECTOMY  02/2000   TA polyp removed from ascending colon.  performed through [facility] (? affiliate in [place])   COLONOSCOPY WITH PROPOFOL N/A 11/03/2016   Procedure: COLONOSCOPY WITH PROPOFOL;  Surgeon: Manya Silvas, MD;  Location: [facility] ENDOSCOPY;  Service: Endoscopy;  Laterality: N/A;   ERCP N/A 03/05/2022   Procedure: ENDOSCOPIC RETROGRADE CHOLANGIOPANCREATOGRAPHY (ERCP);  Surgeon: Carol Ada, MD;  Location: [place];  Service: Gastroenterology;  Laterality: N/A;   HEMORROIDECTOMY     TONSILLECTOMY      FAMILY HISTORY: Family History  Problem Relation Age of Onset   Seizures Sister     HEALTH MAINTENANCE: Social History   Tobacco Use   Smoking status: Former   Smokeless tobacco: Never  Substance Use Topics   Alcohol use: No   Drug use: No     No Known Allergies  Current Facility-Administered Medications  Medication Dose Route Frequency Provider Last Rate Last Admin   albuterol (PROVENTIL) (2.5 MG/3ML) 0.083% nebulizer solution 3 mL  3 mL Inhalation Q4H PRN Ivor Costa, MD       aspirin EC tablet 81 mg  81 mg Oral Daily Ivor Costa, MD   81 mg at 04/17/22 1650   ezetimibe (ZETIA) tablet 10 mg  10 mg Oral QPM Ivor Costa, MD   10 mg at 04/17/22 1900   hydrALAZINE (APRESOLINE) injection 5 mg  5 mg Intravenous Q2H PRN Ivor Costa, MD       levETIRAcetam (KEPPRA) tablet 750 mg  750 mg Oral BID Ivor Costa, MD  750 mg at 04/17/22 2137   LORazepam (ATIVAN) injection 1 mg  1 mg Intravenous Q2H PRN Ivor Costa, MD       magnesium oxide (MAG-OX) tablet 800 mg  800 mg Oral BID Ivor Costa, MD   800 mg at 04/17/22 2136   mometasone-formoterol (DULERA) 200-5 MCG/ACT inhaler 2 puff  2 puff Inhalation BID Ivor Costa, MD       ondansetron [facility]) injection 4 mg  4 mg Intravenous Q8H PRN Ivor Costa, MD       oxyCODONE (Oxy IR/ROXICODONE) immediate release tablet 2.5 mg  2.5  mg Oral Q8H PRN Ivor Costa, MD   2.5 mg at 04/17/22 1654   phenytoin (DILANTIN) ER capsule 200 mg  200 mg Oral QHS Ivor Costa, MD   200 mg at 04/17/22 2137   polyethylene glycol (MIRALAX / GLYCOLAX) packet 17 g  17 g Oral Daily PRN Ivor Costa, MD       senna-docusate (Senokot-S) tablet 1 tablet  1 tablet Oral BID Ivor Costa, MD   1 tablet at 04/17/22 2136    OBJECTIVE: Vitals:   04/17/22 1733 04/17/22 2054  BP: (!) 145/67 (!) 158/59  Pulse: 83 81  Resp: (!) 22 20  Temp:  98.2 F (36.8 C)  SpO2: 97% 99%     Body mass index is 21.11 kg/m.      General: Well-developed, well-nourished, no acute distress. Eyes: Pink conjunctiva, anicteric sclera. HEENT: Normocephalic, moist mucous membranes, clear oropharnyx. Lungs: Clear to auscultation bilaterally. Heart: Regular rate and rhythm. No rubs, murmurs, or gallops. Abdomen: Soft, nontender, nondistended. No organomegaly noted, normoactive bowel sounds. Musculoskeletal: No edema, cyanosis, or clubbing. Neuro: Alert, answering all questions appropriately. Cranial nerves grossly intact. Skin: No rashes or petechiae noted. Psych: Normal affect. Lymphatics: No cervical, calvicular, axillary or inguinal LAD.   LAB RESULTS:  Lab Results  Component Value Date   NA 136 04/17/2022   K 3.1 (L) 04/17/2022   CL 99 04/17/2022   CO2 27 04/17/2022   GLUCOSE 141 (H) 04/17/2022   BUN 18 04/17/2022   CREATININE 0.87 04/17/2022   CALCIUM 8.5 (L) 04/17/2022   PROT 6.9 04/17/2022   ALBUMIN 2.4 (L) 04/17/2022   AST 100 (H) 04/17/2022   ALT 124 (H) 04/17/2022   ALKPHOS 163 (H) 04/17/2022   BILITOT 0.7 04/17/2022   GFRNONAA >60 04/17/2022    Lab Results  Component Value Date   WBC 15.2 (H) 04/17/2022   NEUTROABS 7.1 04/14/2022   HGB 8.3 (L) 04/17/2022   HCT 25.6 (L) 04/17/2022   MCV 79.8 (L) 04/17/2022   PLT 364 04/17/2022    Lab Results  Component Value Date   FERRITIN 559 (H) 04/14/2022     STUDIES: CT ABDOMEN PELVIS W  CONTRAST  Result Date: 04/17/2022 CLINICAL DATA:  Recently diagnosed pancreatic cancer. Recent sphincterotomy and biliary stent placement. Progressive weakness. * Tracking Code: BO * EXAM: CT ABDOMEN AND PELVIS WITH CONTRAST TECHNIQUE: Multidetector CT imaging of the abdomen and pelvis was performed using the standard protocol following bolus administration of intravenous contrast. RADIATION DOSE REDUCTION: This exam was performed according to the departmental dose-optimization program which includes automated exposure control, adjustment of the mA and/or kV according to patient size and/or use of iterative reconstruction technique. CONTRAST:  15m OMNIPAQUE IOHEXOL 300 MG/ML  SOLN COMPARISON:  Abdominopelvic CT 04/05/2022. Abdominal MRI 03/03/2022. FINDINGS: Lower chest: Patchy atelectasis at both lung bases. No pleural effusion or suspicious pulmonary nodule. There is atherosclerosis of the aorta and  coronary arteries. Hepatobiliary: Metallic biliary stent remains in place with persistent pneumobilia. The gallbladder is decompressed, without wall thickening or stones. Unchanged appearance of the liver without suspicious abnormality. Pancreas: Known hypodense mass involving the pancreatic head is similar to previous study, measuring approximately 2.1 x 1.3 cm on image 29/3. Stable pancreatic ductal dilatation and mild surrounding inflammation. Spleen: Normal in size without focal abnormality. Adrenals/Urinary Tract: Unchanged 2.2 cm left adrenal nodule which was previously characterized by noncontrast CT as an adenoma. No follow-up imaging recommended. The right adrenal gland appears normal. No evidence of urinary tract calculus, suspicious renal lesion or hydronephrosis. Unchanged bilateral renal cysts; no follow-up imaging recommended. The bladder appears unremarkable for its degree of distention. Stomach/Bowel: No enteric contrast administered. The stomach appears unremarkable for its degree of distension.  No evidence of bowel wall thickening, distention or surrounding inflammatory change. Vascular/Lymphatic: There are no enlarged abdominal or pelvic lymph nodes. Diffuse aortic and branch vessel atherosclerosis without evidence of aneurysm or large vessel occlusion. No gross vascular encasement identified in the porta hepatis. Reproductive: Status post hysterectomy.  No adnexal mass. Other: No ascites, free air or peritoneal nodularity. Previously noted small subcutaneous nodule within the left anterior abdominal wall has slightly decreased in size and may be related to previous subcutaneous injection. Musculoskeletal: No acute or significant osseous findings. Facet hypertrophy in the lower lumbar spine with a degenerative grade 1 anterolisthesis at L4-5. IMPRESSION: 1. No acute findings or clear explanation for the patient's symptoms. 2. Stable appearance of the known pancreatic head mass with associated pancreatic ductal dilatation and pneumobilia following biliary stenting. No evidence of metastatic disease. 3. Aortic Atherosclerosis (ICD10-I70.0). Electronically Signed   By: Richardean Sale M.D.   On: 04/17/2022 16:43   US Venous Img Lower Unilateral Right  Result Date: 04/17/2022 CLINICAL DATA:  Swelling EXAM: Right LOWER EXTREMITY VENOUS DOPPLER ULTRASOUND TECHNIQUE: Gray-scale sonography with compression, as well as color and duplex ultrasound, were performed to evaluate the deep venous system(s) from the level of the common femoral vein through the popliteal and proximal calf veins. COMPARISON:  None Available. FINDINGS: VENOUS Normal compressibility of the common femoral, superficial femoral, and popliteal veins, as well as the visualized calf veins. Visualized portions of profunda femoral vein and great saphenous vein unremarkable. No filling defects to suggest DVT on grayscale or color Doppler imaging. Doppler waveforms show normal direction of venous flow, normal respiratory plasticity and response to  augmentation. OTHER In the popliteal fossa there is a 4.5 x 1.4 x 2.8 cm heterogenous oval fluid collection. There is no flow within or around this collection. This is incompletely assessed on the provided images. Limitations: none IMPRESSION: 1. No evidence of right lower extremity DVT. 2. In the popliteal fossa there is a 4.5 x 1.4 x 2.8 cm heterogenous oval fluid collection, which could represent Baker's cyst with hemorrhage or synovitis. Electronically Signed   By: Marin Roberts M.D.   On: 04/17/2022 10:26   DG Chest Portable 1 View  Result Date: 04/17/2022 CLINICAL DATA:  Weakness and fatigue in a female at age 8. EXAM: PORTABLE CHEST 1 VIEW COMPARISON:  March 14, 2022. FINDINGS: EKG leads project over the chest. Cardiomediastinal contours and hilar structures with persistent cardiac enlargement. Increased interstitial markings improved compared to most recent comparison imaging. No sign of lobar consolidative process. No pneumothorax. On limited assessment no acute skeletal findings. IMPRESSION: Cardiomegaly with improved interstitial markings compared to most recent comparison imaging. No sign of lobar consolidative process. Difficult to exclude  mild interstitial edema, difficult to differentiate from background chronic changes. Electronically Signed   By: Zetta Bills M.D.   On: 04/17/2022 09:34   CT ABD PELVIS W/WO CM ONCOLOGY PANCREATIC PROTOCOL  Result Date: 04/07/2022 CLINICAL DATA:  Pancreatic head adenocarcinoma diagnosed on 03/05/2022 ERCP. Staging. * Tracking Code: BO * EXAM: CT ABDOMEN AND PELVIS WITHOUT AND WITH CONTRAST TECHNIQUE: Multidetector CT imaging of the abdomen and pelvis was performed following the standard protocol before and following the bolus administration of intravenous contrast. RADIATION DOSE REDUCTION: This exam was performed according to the departmental dose-optimization program which includes automated exposure control, adjustment of the mA and/or kV according  to patient size and/or use of iterative reconstruction technique. CONTRAST:  35m OMNIPAQUE IOHEXOL 300 MG/ML  SOLN COMPARISON:  03/03/2022 MRI abdomen and unenhanced CT abdomen/pelvis FINDINGS: Lower chest: No significant pulmonary nodules or acute consolidative airspace disease. Coronary atherosclerosis. Hepatobiliary: Normal liver size. Subcapsular hypodense 0.8 cm far inferior right liver lesion (series 9/image 57), too small to characterize, stable from 03/03/2022 MRI abdomen study, where it was T2 hyperintense and compatible with a tiny benign cyst. No additional liver lesions. Metallic CBD stent in place extending from the mid CBD to the descending duodenal lumen. Pneumobilia throughout the intrahepatic bile ducts and nondistended gallbladder indicating stent patency. Proximal CBD diameter 7 mm. Mild central intrahepatic biliary ductal dilatation. Pancreas: Poorly defined hypoenhancing 2.9 x 2.4 cm pancreatic head mass (series 9/image 47), previously 2.9 x 2.3 cm on 03/03/2022 MRI using similar measurement technique, not appreciably changed. No clear tumor involvement of the SMV, portal veins or SMA. Dilated main pancreatic duct (6 mm diameter) with abrupt caliber transition at the level of the pancreatic head. Diffuse haziness of the peripancreatic fat without measurable peripancreatic fluid collections. No evidence of pancreatic parenchymal necrosis. Tiny 1.2 x 0.6 cm pancreatic body pseudocyst (series 9/image 44), slightly increased from 0.8 x 0.4 cm on 03/03/2022 MRI. Spleen: Normal size. No mass. Adrenals/Urinary Tract: Left adrenal 2.2 x 1.8 cm nodule with precontrast density 4 HU, compatible with an adenoma. Mildly irregular right adrenal gland is unchanged, without discrete right adrenal nodules. No hydronephrosis. Small simple bilateral renal cysts, largest 1.4 cm in the anterior upper right kidney. No suspicious renal masses. Normal bladder. Stomach/Bowel: Small hiatal hernia. Otherwise normal  nondistended stomach. Normal caliber small bowel with no small bowel wall thickening. Appendix not discretely visualized. Normal large bowel with no diverticulosis, large bowel wall thickening or pericolonic fat stranding. Vascular/Lymphatic: Atherosclerotic nonaneurysmal abdominal aorta. Patent portal, splenic, hepatic and renal veins. No pathologically enlarged lymph nodes in the abdomen or pelvis. Reproductive: Status post hysterectomy, with no abnormal findings at the vaginal cuff. No adnexal mass. Other: No pneumoperitoneum, ascites or focal fluid collection. Nonspecific subcutaneous 0.8 cm ventral left abdominal wall soft tissue density nodule (series 9/image 85), new since 03/03/2022 CT. Musculoskeletal: No aggressive appearing focal osseous lesions. Mild thoracolumbar spondylosis. IMPRESSION: 1. Poorly defined hypoenhancing 2.9 cm pancreatic head mass, not appreciably changed since 03/03/2022 MRI, compatible with known primary pancreatic adenocarcinoma. No evidence of vascular invasion by the pancreatic tumor. 2. Dilated main pancreatic duct with abrupt caliber transition at the level of the pancreatic head. Well-positioned CBD stent with pneumobilia indicating stent patency. 3. No lymphadenopathy or other findings highly suspicious for metastatic disease in the abdomen or pelvis. Nonspecific subcutaneous 0.8 cm ventral left abdominal wall soft tissue density nodule, new since 03/03/2022 CT, most commonly due to subcutaneous injections. 4. Evidence of ongoing pancreatitis. Tiny 1.2 x 0.6  cm pancreatic body pseudocyst, slightly increased. 5. 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 6. Small hiatal hernia. 7. Coronary atherosclerosis. 8.  Aortic Atherosclerosis (ICD10-I70.0). Electronically Signed   By: Ilona Sorrel M.D.   On: 04/07/2022 16:08    ASSESSMENT AND PLAN:   Shirley Nichols is a 81 y.o. female with recent diagnosis of localized pancreatic cancer, prolonged  hospitalization in October 2023 biliary obstruction status post sphincterotomy and metal stent complicated by metabolic encephalopathy requiring intubation presented to ED on 04/17/2022 for worsening generalized weakness.  Labs showed worsening transaminitis however normal T. bili.  CT abdomen pelvis showed stable pancreatic mass with pancreatic ductal dilatation and metallic stent in place. ?  Drug-related.  She was started on Dilantin recently.  Daughter reports that per neurologist they have been tapering down on Dilantin.  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    Patient was evaluated by Dr. Zenia Resides as outpatient and she is not a surgical candidate due to poor performance status. Also, she is not a candidate for chemotherapy at this time due to poor performance status.  Patient was seen by palliative care and had goals of care discussion.  Information was discussed in detail with patient, husband, daughter and son-in-law.  I will continue to follow the patient.   Patient expressed understanding and was in agreement with this plan. She also understands that She can call clinic at any time with any questions, concerns, or complaints.   I spent a total of 60 minutes reviewing chart data, face-to-face evaluation with the patient, counseling and coordination of care as detailed above.  Jane Canary, MD   04/17/2022 10:02 PM

## 2022-04-17 NOTE — ED Notes (Signed)
Pt back from ct, pt states that her leg pain is worse, family at bedside, pain med given.

## 2022-04-17 NOTE — ED Notes (Signed)
Pt states that she doesn't want anything for pain at this time

## 2022-04-17 NOTE — Telephone Encounter (Signed)
Reviewed chart- patient is in the ER as of 04/17/22 at 830 am

## 2022-04-17 NOTE — Telephone Encounter (Signed)
Pt in ER

## 2022-04-17 NOTE — ED Provider Notes (Signed)
[facility] Provider Note    Event Date/Time   First MD Initiated Contact with Patient 04/17/22 0840     (approximate)   History   Weakness   HPI  Shirley Nichols is a 81 y.o. female somewhat recent diagnosis of pancreatic cancer had to have stenting performed.  She has been following with oncology but has not yet started treatment.  She has had to have a few infusions for normal saline for dehydration.  Husband reports she has been participating in physical therapy but started having increasing fatigue and generally feeling weak starting around Thursday, and has progressed daily now to the point that he and family have had to assist her up down in and out of bed and she is becoming more frail almost daily  The patient reports that she has no pain or discomfort no shortness of breath no nausea or vomiting.  Reports she has no appetite and drinks fluids but does not eat anything solid now because of a lack of appetite.  No abdominal pain.  No cough no cold.  She reports yesterday she checked her temperature and it seemed like she might of had a very low-grade fever like 99 or possibly 100     Physical Exam   Triage Vital Signs: ED Triage Vitals  Enc Vitals Group     BP      Pulse      Resp      Temp      Temp src      SpO2      Weight      Height      Head Circumference      Peak Flow      Pain Score      Pain Loc      Pain Edu?      Excl. in [place]?     Most recent vital signs: Vitals:   04/17/22 0847  BP: (!) 153/87  Pulse: 84  Resp: 17  Temp: 99.3 F (37.4 C)  SpO2: 97%     General: Awake, no distress.  Appears pale generally weak and chronically ill but in no acute extremis.  Fully alert well oriented CV:  Good peripheral perfusion.  Normal heart tones Resp:  Normal effort.  Clear bilaterally.  Normal work of breathing Abd:  No distention.  Soft nontender nondistended throughout Other:  Moves all extremities without deficits  but exhibits generalized weakness about 4+ strength in all extremities.  She does not appear to have enough strength to be able to sit up independently, but with assistance she is able to hold herself up as I auscultate her lungs   ED Results / Procedures / Treatments   Labs (all labs ordered are listed, but only abnormal results are displayed) Labs Reviewed  CBC - Abnormal; Notable for the following components:      Result Value   WBC 15.2 (*)    RBC 3.21 (*)    Hemoglobin 8.3 (*)    HCT 25.6 (*)    MCV 79.8 (*)    MCH 25.9 (*)    All other components within normal limits  COMPREHENSIVE METABOLIC PANEL - Abnormal; Notable for the following components:   Potassium 3.1 (*)    Glucose, Bld 141 (*)    Calcium 8.5 (*)    Albumin 2.4 (*)    AST 100 (*)    ALT 124 (*)    Alkaline Phosphatase 163 (*)    All  other components within normal limits  PROTIME-INR - Abnormal; Notable for the following components:   Prothrombin Time 16.1 (*)    INR 1.3 (*)    All other components within normal limits  BRAIN NATRIURETIC PEPTIDE - Abnormal; Notable for the following components:   B Natriuretic Peptide 236.1 (*)    All other components within normal limits  RESP PANEL BY RT-PCR (FLU A&B, COVID) ARPGX2  URINALYSIS, ROUTINE W REFLEX MICROSCOPIC  TYPE AND SCREEN   Labs do reveal minimal transaminitis but normal bilirubin.  Seems to be a slight increasing trend in her LFTs.  EKG  And interpreted by me at 910 heart rate 80 QRS 140 QTc 500 Normal sinus rhythm right bundle branch block.  No evidence of acute ischemia   RADIOLOGY  CXR -radiologist report Cardiomegaly with improved interstitial markings compared to most recent comparison imaging. No sign of lobar consolidative process. Difficult to exclude mild interstitial edema, difficult to differentiate from background chronic changes.   lower extremity duplex to rule out DVT pending at time of transition of care to hospitalist  service  PROCEDURES:  Critical Care performed: No  Procedures   MEDICATIONS ORDERED IN ED: Medications  potassium chloride 10 mEq in 100 mL IVPB (10 mEq Intravenous New Bag/Given 04/17/22 1050)  0.9 %  sodium chloride infusion ( Intravenous New Bag/Given 04/17/22 1102)  albuterol (VENTOLIN HFA) 108 (90 Base) MCG/ACT inhaler 2 puff (has no administration in time range)  potassium chloride (KLOR-CON) packet 40 mEq (40 mEq Oral Given 04/17/22 1040)     IMPRESSION / MDM / ASSESSMENT AND PLAN / ED COURSE  I reviewed the triage vital signs and the nursing notes.                               Differential diagnosis includes, but is not limited to, deconditioning, dehydration, hypokalemia, indolent infection especially given the leukocytosis, doubt central neurologic cause, previous MRI of the brain without mets, no strokelike symptoms.  She appears slightly hypovolemic by examination but family does report diagnosis her right leg seems slightly swollen compared to the left.  We will obtain ultrasound to exclude DVT, suspect low likelihood of DVT.  Differential diagnosis is broad,  Patient's presentation is most consistent with acute complicated illness / injury requiring diagnostic workup.  The patient is on the cardiac monitor to evaluate for evidence of arrhythmia and/or significant heart rate changes.  Clinical Course as of 04/17/22 1221  Mon Apr 17, 2022  1049 Patient has anemia, appears to be chronic.  Currently resting with stable hemodynamics.  Labs also reveal hypokalemia which we will replete them.  She has leukocytosis, but no associated pains but waiting on viral studies and also will obtain urinalysis.  Discussed at length with her husband and he has notable concerns about her having multiple physicians and difficulty with coordination of care from a central provider.  We will plan to admit to the hospitalist service for further care and treatment.  Also Dr. Doyne Keel from oncology  will be consulting on the patient and is aware of admit plan [MQ]  1221 Urinalysis with rare bacteria.  Sent for culture. [MQ]    Clinical Course User Index [MQ] Delman Kitten, MD   Given the patient's degree of weakness, ambulate dysfunction leukocytosis, if consulted with her hospitalist the patient will be admitted to the service of Dr. Blaine Hamper.  Currently awaiting in and out catheterization for urine specimen.  Oncology consult pending with Dr. Havery Moros team.  Also discussed opportunity for better core coordination and communication that needs to be addressed during hospital stay with Dr. Blaine Hamper  Family including the patient's husband updated at the bedside who understand the plan for admission.  FINAL CLINICAL IMPRESSION(S) / ED DIAGNOSES   Final diagnoses:  Weakness  Hypokalemia  Ambulatory dysfunction     Rx / DC Orders   ED Discharge Orders     None        Note:  This document was prepared using Dragon voice recognition software and may include unintentional dictation errors.   Delman Kitten, MD 04/17/22 1124

## 2022-04-17 NOTE — ED Provider Notes (Signed)
Clinical Course as of 04/17/22 1221  Mon Apr 17, 2022  1049 Patient has anemia, appears to be chronic.  Currently resting with stable hemodynamics.  Labs also reveal hypokalemia which we will replete them.  She has leukocytosis, but no associated pains but waiting on viral studies and also will obtain urinalysis.  Discussed at length with her husband and he has notable concerns about her having multiple physicians and difficulty with coordination of care from a central provider.  We will plan to admit to the hospitalist service for further care and treatment.  Also Dr. Doyne Keel from oncology will be consulting on the patient and is aware of admit plan [MQ]  1221 Urinalysis with rare bacteria.  Sent for culture. [MQ]    Clinical Course User Index [MQ] Delman Kitten, MD      Delman Kitten, MD 04/17/22 [phone]

## 2022-04-18 ENCOUNTER — Observation Stay
Admit: 2022-04-18 | Discharge: 2022-04-18 | Disposition: A | Discharge status: Home / Self Care | Payer: PPO | Attending: Internal Medicine | Admitting: Internal Medicine

## 2022-04-18 DIAGNOSIS — I110 Hypertensive heart disease with heart failure: Secondary | ICD-10-CM | POA: Diagnosis present

## 2022-04-18 DIAGNOSIS — Z7982 Long term (current) use of aspirin: Secondary | ICD-10-CM | POA: Diagnosis not present

## 2022-04-18 DIAGNOSIS — R569 Unspecified convulsions: Secondary | ICD-10-CM | POA: Diagnosis present

## 2022-04-18 DIAGNOSIS — Z90710 Acquired absence of both cervix and uterus: Secondary | ICD-10-CM | POA: Diagnosis not present

## 2022-04-18 DIAGNOSIS — R531 Weakness: Secondary | ICD-10-CM

## 2022-04-18 DIAGNOSIS — D638 Anemia in other chronic diseases classified elsewhere: Secondary | ICD-10-CM

## 2022-04-18 DIAGNOSIS — Z87891 Personal history of nicotine dependence: Secondary | ICD-10-CM | POA: Diagnosis not present

## 2022-04-18 DIAGNOSIS — E43 Unspecified severe protein-calorie malnutrition: Secondary | ICD-10-CM | POA: Diagnosis present

## 2022-04-18 DIAGNOSIS — J449 Chronic obstructive pulmonary disease, unspecified: Secondary | ICD-10-CM | POA: Diagnosis present

## 2022-04-18 DIAGNOSIS — E876 Hypokalemia: Secondary | ICD-10-CM

## 2022-04-18 DIAGNOSIS — C259 Malignant neoplasm of pancreas, unspecified: Secondary | ICD-10-CM | POA: Diagnosis present

## 2022-04-18 DIAGNOSIS — R7989 Other specified abnormal findings of blood chemistry: Secondary | ICD-10-CM | POA: Diagnosis not present

## 2022-04-18 DIAGNOSIS — Z66 Do not resuscitate: Secondary | ICD-10-CM | POA: Diagnosis present

## 2022-04-18 DIAGNOSIS — M7121 Synovial cyst of popliteal space [Baker], right knee: Secondary | ICD-10-CM

## 2022-04-18 DIAGNOSIS — D75839 Thrombocytosis, unspecified: Secondary | ICD-10-CM | POA: Diagnosis present

## 2022-04-18 DIAGNOSIS — Z6821 Body mass index (BMI) 21.0-21.9, adult: Secondary | ICD-10-CM | POA: Diagnosis not present

## 2022-04-18 DIAGNOSIS — E785 Hyperlipidemia, unspecified: Secondary | ICD-10-CM | POA: Diagnosis present

## 2022-04-18 DIAGNOSIS — D630 Anemia in neoplastic disease: Secondary | ICD-10-CM | POA: Diagnosis present

## 2022-04-18 DIAGNOSIS — Z79899 Other long term (current) drug therapy: Secondary | ICD-10-CM | POA: Diagnosis not present

## 2022-04-18 DIAGNOSIS — Z1152 Encounter for screening for COVID-19: Secondary | ICD-10-CM | POA: Diagnosis not present

## 2022-04-18 DIAGNOSIS — C250 Malignant neoplasm of head of pancreas: Secondary | ICD-10-CM | POA: Diagnosis not present

## 2022-04-18 DIAGNOSIS — D72829 Elevated white blood cell count, unspecified: Secondary | ICD-10-CM | POA: Diagnosis present

## 2022-04-18 DIAGNOSIS — D509 Iron deficiency anemia, unspecified: Secondary | ICD-10-CM | POA: Diagnosis present

## 2022-04-18 DIAGNOSIS — R7401 Elevation of levels of liver transaminase levels: Secondary | ICD-10-CM | POA: Diagnosis not present

## 2022-04-18 DIAGNOSIS — Z85828 Personal history of other malignant neoplasm of skin: Secondary | ICD-10-CM | POA: Diagnosis not present

## 2022-04-18 DIAGNOSIS — Z515 Encounter for palliative care: Secondary | ICD-10-CM | POA: Diagnosis not present

## 2022-04-18 LAB — BASIC METABOLIC PANEL WITH GFR
Anion gap: 7 (ref 5–15)
BUN: 17 mg/dL (ref 8–23)
CO2: 28 mmol/L (ref 22–32)
Calcium: 8.1 mg/dL — ABNORMAL LOW (ref 8.9–10.3)
Chloride: 101 mmol/L (ref 98–111)
Creatinine, Ser: 0.8 mg/dL (ref 0.44–1.00)
GFR, Estimated: >60 mL/min (ref >60)
Glucose, Bld: 115 mg/dL — ABNORMAL HIGH (ref 70–99)
Potassium: 3.4 mmol/L — ABNORMAL LOW (ref 3.5–5.1)
Sodium: 136 mmol/L (ref 135–145)

## 2022-04-18 LAB — MAGNESIUM: Magnesium: 1.7 mg/dL (ref 1.7–2.4)

## 2022-04-18 LAB — CBC
HCT: 22.6 % — ABNORMAL LOW (ref 36.0–46.0)
Hemoglobin: 7.5 g/dL — ABNORMAL LOW (ref 12.0–15.0)
MCH: 26.4 pg (ref 26.0–34.0)
MCHC: 33.2 g/dL (ref 30.0–36.0)
MCV: 79.6 fL — ABNORMAL LOW (ref 80.0–100.0)
Platelets: 365 10*3/uL (ref 150–400)
RBC: 2.84 MIL/uL — ABNORMAL LOW (ref 3.87–5.11)
RDW: 14.9 % (ref 11.5–15.5)
WBC: 12.9 10*3/uL — ABNORMAL HIGH (ref 4.0–10.5)
nRBC: 0 % (ref 0.0–0.2)

## 2022-04-18 LAB — ECHOCARDIOGRAM COMPLETE
AR max vel: 2.52 cm2
AV Area VTI: 2.83 cm2
AV Area mean vel: 2.43 cm2
AV Mean grad: 5 mmHg
AV Peak grad: 9.5 mmHg
Ao pk vel: 1.54 m/s
Area-P 1/2: 3.77 cm2
Height: 64 in
S' Lateral: 3 cm
Weight: 1968 oz

## 2022-04-18 LAB — TYPE AND SCREEN
ABO/RH(D): O POS
Antibody Screen: NEGATIVE

## 2022-04-18 LAB — URINE CULTURE: Culture: NO GROWTH

## 2022-04-18 MED ORDER — POTASSIUM CHLORIDE CRYS ER 20 MEQ PO TBCR
40.0000 meq | EXTENDED_RELEASE_TABLET | Freq: Once | ORAL | Status: AC
  Administered 2022-04-18: 40 meq via ORAL
  Filled 2022-04-18: qty 2

## 2022-04-18 MED ORDER — SODIUM CHLORIDE 0.9 % IV BOLUS
500.0000 mL | Freq: Once | INTRAVENOUS | Status: AC
  Administered 2022-04-18: 500 mL via INTRAVENOUS

## 2022-04-18 MED ORDER — ENSURE ENLIVE PO LIQD
237.0000 mL | Freq: Three times a day (TID) | ORAL | Status: DC
  Administered 2022-04-19 – 2022-04-22 (×10): 237 mL via ORAL

## 2022-04-18 MED ORDER — PHENYTOIN SODIUM EXTENDED 100 MG PO CAPS
100.0000 mg | ORAL_CAPSULE | Freq: Every day | ORAL | Status: DC
  Administered 2022-04-19 – 2022-04-21 (×3): 100 mg via ORAL
  Filled 2022-04-18 (×3): qty 1

## 2022-04-18 MED ORDER — MUSCLE RUB 10-15 % EX CREA
TOPICAL_CREAM | CUTANEOUS | Status: DC | PRN
  Filled 2022-04-18: qty 85

## 2022-04-18 MED ORDER — ADULT MULTIVITAMIN W/MINERALS CH
1.0000 | ORAL_TABLET | Freq: Every day | ORAL | Status: DC
  Administered 2022-04-18 – 2022-04-22 (×5): 1 via ORAL
  Filled 2022-04-18 (×4): qty 1

## 2022-04-18 NOTE — Hospital Course (Signed)
82 year old female with past medical history of hypertension, hyperlipidemia, COPD, skin cancer, recently diagnosed pancreatic cancer status post middle biliary stent in the common bile duct, seizure, anemia.  She presents with generalized weakness and pains.  She was found to have a Baker's cyst in the right leg.

## 2022-04-18 NOTE — Assessment & Plan Note (Signed)
Appreciate oncology and palliative care consultations.

## 2022-04-18 NOTE — Assessment & Plan Note (Signed)
On Keppra and Dilantin

## 2022-04-18 NOTE — Evaluation (Signed)
Occupational Therapy Evaluation Patient Details Name: Shirley Nichols MRN: [id number] DOB: 1941-03-22 Today's Date: 04/18/2022   History of Present Illness Shirley Nichols is a 81 y.o. female with medical history significant of hypertension, hyperlipidemia, COPD, skin cancer, recently diagnosed pancreatic cancer, s/p of biliary stent placement due to biliary obstruction, seizure, anemia, who presents with generalized weakness.   Clinical Impression   Patient presenting with decreased independence in self care, balance, functional mobility/transfers, and endurance. Prior to admission, pt was independent with ADLs, Mod I for IADLs, and independent for functional mobility without an AD. During evaluation, pt required Min-Max A for bed mobility, Mod A to stand from EOB, Mod A for seated LB dressing, set up A for UB dressing, and set up A for seated grooming tasks. Anticipate increased assistance required for standing ADL tasks. Pt found on RA with SpO2 93% at rest and desatting to 90% with activity. Instructed pt in pursed lip breathing and placed back on 2L O2 via [place]. Pt's performance limited by 10/10 R knee pain and required rest breaks in between activities. RN notified pt requesting pain meds. Pt will benefit from acute OT to increase overall independence in the areas of ADLs, functional mobility in order to safely discharge to next venue of care. Upon hospital discharge, recommend STR to maximize pt safety and return to PLOF.     Recommendations for follow up therapy are one component of a multi-disciplinary discharge planning process, led by the attending physician.  Recommendations may be updated based on patient status, additional functional criteria and insurance authorization.   Follow Up Recommendations  Skilled nursing-short term rehab (<3 hours/day)     Assistance Recommended at Discharge Frequent or constant Supervision/Assistance  Patient can return home with the following A lot of  help with bathing/dressing/bathroom;A lot of help with walking and/or transfers;Assistance with cooking/housework;Assist for transportation;Help with stairs or ramp for entrance    Functional Status Assessment  Patient has had a recent decline in their functional status and demonstrates the ability to make significant improvements in function in a reasonable and predictable amount of time.  Equipment Recommendations  Other (comment) (defer to next venue of care)    Recommendations for Other Services       Precautions / Restrictions Precautions Precautions: Fall Precaution comments: monitor O2 Restrictions Weight Bearing Restrictions: No      Mobility Bed Mobility Overal bed mobility: Needs Assistance Bed Mobility: Sit to Supine, Sidelying to Sit   Sidelying to sit: Max assist (pt unable to push self up into sitting position)   Sit to supine: Min assist (to manage RLE)   General bed mobility comments: VC for hand placement    Transfers Overall transfer level: Needs assistance Equipment used: Rolling walker (2 wheels) Transfers: Sit to/from Stand Sit to Stand: Mod assist, From elevated surface           General transfer comment: agrreable to attempt standing with encouragement, STS from EOB      Balance Overall balance assessment: Needs assistance Sitting-balance support: Feet supported Sitting balance-Leahy Scale: Fair Sitting balance - Comments: Min guard for dynamic sitting balance during dressing tasks   Standing balance support: Bilateral upper extremity supported, During functional activity Standing balance-Leahy Scale: Poor                             ADL either performed or assessed with clinical judgement   ADL Overall ADL's : Needs assistance/impaired Eating/Feeding:  Set up;Bed level   Grooming: Set up;Sitting;Wash/dry face;Oral care           Upper Body Dressing : Set up;Sitting Upper Body Dressing Details (indicate cue type and  reason): to don/doff gown Lower Body Dressing: Moderate assistance;Min guard;Sitting/lateral leans Lower Body Dressing Details (indicate cue type and reason): Adjusted L sock in sitting with Min guard for dynamic sitting balance, unable to lift R knee to reach sock 2/2 pain Toilet Transfer: Moderate assistance;Rolling walker (2 wheels) Toilet Transfer Details (indicate cue type and reason): simulated with STS from EOB         Functional mobility during ADLs: Min guard;Rolling walker (2 wheels) (to take ~2 lateral steps toward [facility])       Vision Baseline Vision/History: 1 Wears glasses (reading) Patient Visual Report: No change from baseline       Perception     Praxis      Pertinent Vitals/Pain Pain Assessment Pain Assessment: 0-10 Pain Score: 10-Worst pain ever Pain Location: R knee Pain Descriptors / Indicators: Grimacing, Guarding, Sharp Pain Intervention(s): Limited activity within patient's tolerance, Monitored during session, Patient requesting pain meds-RN notified, Utilized relaxation techniques     Hand Dominance Right   Extremity/Trunk Assessment Upper Extremity Assessment Upper Extremity Assessment: Generalized weakness   Lower Extremity Assessment Lower Extremity Assessment: Generalized weakness   Cervical / Trunk Assessment Cervical / Trunk Assessment: Normal   Communication Communication Communication: No difficulties   Cognition Arousal/Alertness: Awake/alert Behavior During Therapy: WFL for tasks assessed/performed Overall Cognitive Status: Within Functional Limits for tasks assessed                                 General Comments: A&Ox4     General Comments  Pt found on RA and SpO2 93% at rest, dropping to 90% on RA with activity, instructed pt in pursed lip breathing and placed back on 2L, improved to >92%. Required rest breaks/deep breathing in between mobillity activities 2/2 R knee pain.    Exercises Other Exercises Other  Exercises: OT provided education re: role of OT, OT POC, post acute recs, sitting up for all meals, EOB/OOB mobility with assistance, home/fall safety.     Shoulder Instructions      Home Living Family/patient expects to be discharged to:: Private residence Living Arrangements: Spouse/significant other Available Help at Discharge: Family;Available 24 hours/day Type of Home: House Home Access: Stairs to enter [employer] of Steps: 3 Entrance Stairs-Rails: Right;Left Home Layout: One level     Bathroom Shower/Tub: Occupational psychologist: Standard     Home Equipment: Rollator (4 wheels);Shower seat;Grab bars - tub/shower   Additional Comments: Daughter lives ~5 min away      Prior Functioning/Environment Prior Level of Function : Independent/Modified Independent;Driving             Mobility Comments: Independent with no AD, denies history of falls in past 6 months ADLs Comments: Independent for ADLs, has a maid that comes every 2 weeks to clean the house, still driving (pt reported starting transition to driving retirement, husband can drive if needed), husband assists with cooking PRN        OT Problem List: Decreased strength;Cardiopulmonary status limiting activity;Pain;Decreased activity tolerance;Impaired balance (sitting and/or standing)      OT Treatment/Interventions: Self-care/ADL training;Therapeutic exercise;Therapeutic activities;DME and/or AE instruction;Patient/family education;Balance training    OT Goals(Current goals can be found in the care plan section) Acute Rehab  OT Goals Patient Stated Goal: reduce pain and return to PLOF OT Goal Formulation: With patient Time For Goal Achievement: 05/02/22 Potential to Achieve Goals: Good  OT Frequency: Min 2X/week    Co-evaluation              AM-PAC OT "6 Clicks" Daily Activity     Outcome Measure Help from another person eating meals?: A Little Help from another person taking  care of personal grooming?: A Little Help from another person toileting, which includes using toliet, bedpan, or urinal?: A Lot Help from another person bathing (including washing, rinsing, drying)?: A Lot Help from another person to put on and taking off regular upper body clothing?: A Little Help from another person to put on and taking off regular lower body clothing?: A Lot 6 Click Score: 15   End of Session Equipment Utilized During Treatment: Gait belt;Rolling walker (2 wheels);Oxygen (2L O2 via ) Nurse Communication: Mobility status;Patient requests pain meds  Activity Tolerance: Patient tolerated treatment well;Patient limited by pain Patient left: in bed;with call bell/phone within reach;with bed alarm set  OT Visit Diagnosis: Unsteadiness on feet (R26.81);Muscle weakness (generalized) (M62.81);Pain Pain - Right/Left: Right Pain - part of body: Knee                Time: 4132-4401 OT Time Calculation (min): 47 min Charges:  OT General Charges $OT Visit: 1 Visit OT Evaluation $OT Eval Low Complexity: 1 Low OT Treatments $Self Care/Home Management : 8-22 mins  [facility] MS, OTR/L ascom [phone]  04/18/22, 1:46 PM

## 2022-04-18 NOTE — Progress Notes (Signed)
*  PRELIMINARY RESULTS* Echocardiogram 2D Echocardiogram has been performed.  Shirley Nichols 04/18/2022, 7:44 AM

## 2022-04-18 NOTE — Progress Notes (Signed)
PT Cancellation Note  Patient Details Name: Shirley Nichols MRN: [id number] DOB: 16-Apr-1941   Cancelled Treatment:    Reason Eval/Treat Not Completed: Patient declined, no reason specified.  PT consult received.  Chart reviewed.  Nurse reports pt recently worked with OT and pt needing pain meds before further therapy attempts.  Nursing then gave pt pain meds and reporting pt was not feeling up to any more therapy at this time (pt declining physical therapy session).  Will re-attempt PT evaluation at a later date/time.  Leitha Bleak, PT 04/18/22, 11:31 AM

## 2022-04-18 NOTE — Assessment & Plan Note (Addendum)
Bilirubin normal range.  Case discussed with gastroenterology.  Metal stents can stay on lifelong.  With bilirubin normal for stent is functioning.

## 2022-04-18 NOTE — Assessment & Plan Note (Signed)
May end up needing a blood transfusion if hemoglobin drops down further.  Today's hemoglobin 7.4.  We will get a type and cross in the morning.

## 2022-04-18 NOTE — Assessment & Plan Note (Signed)
Continue supplements

## 2022-04-18 NOTE — Evaluation (Signed)
Physical Therapy Evaluation Patient Details Name: Shirley Nichols MRN: [id number] DOB: 01-20-1941 Today's Date: 04/18/2022  History of Present Illness  Pt is an 81 y.o. female presenting to hospital 04/17/22 with generalized weakness.  Pt admitted with generalized weakness, leukocytosis, intersitital edema, malignant obstructive jaundice, htn, HLD, hypokalemia, seizure, elevated LFT's, and microcytic anemia.  PMH includes htn, HLD, COPD, skin CA, recently diagnosed pancreatic CA, s/p biliary stent placement d/t biliary obstruction, seizure, anemia.  Clinical Impression  Prior to hospital admission, pt was most recently modified independent ambulating with rollator; lives with her husband in 1 level home with 3 STE L railing.  Pain 0/10 R knee at rest; increased pain noted with limited R knee ROM or functional mobility; and 6/10 R knee pain at rest end of session (nurse notified regarding pt's pain).   Currently pt is min to mod assist with transfers using RW; pt initially pivoting on L LE (when attempting to take steps recliner to bed with RW use) but with vc's for increasing UE support through RW to offweight R LE during L LE advancement pt able to progress to taking short steps with L LE and R LE (increased effort and time to take steps d/t R knee pain); and min to mod assist sit to supine in bed.  Pt would benefit from skilled PT to address noted impairments and functional limitations (see below for any additional details).  Upon hospital discharge, pt would benefit from SNF.  If pt decides to discharge home instead, anticipate pt will require 24/7 assist, manual w/c, RW, BSC, and HHPT.    Recommendations for follow up therapy are one component of a multi-disciplinary discharge planning process, led by the attending physician.  Recommendations may be updated based on patient status, additional functional criteria and insurance authorization.  Follow Up Recommendations Skilled nursing-short term rehab  (<3 hours/day) Can patient physically be transported by private vehicle: No    Assistance Recommended at Discharge Frequent or constant Supervision/Assistance  Patient can return home with the following  A lot of help with walking and/or transfers;A lot of help with bathing/dressing/bathroom;Assistance with cooking/housework;Assist for transportation;Help with stairs or ramp for entrance    Equipment Recommendations Rolling walker (2 wheels);BSC/3in1;Wheelchair (measurements PT);Wheelchair cushion (measurements PT)  Recommendations for Other Services       Functional Status Assessment Patient has had a recent decline in their functional status and demonstrates the ability to make significant improvements in function in a reasonable and predictable amount of time.     Precautions / Restrictions Precautions Precautions: Fall Precaution Comments: Seizure precautions Restrictions Weight Bearing Restrictions: No      Mobility  Bed Mobility Overal bed mobility: Needs Assistance Bed Mobility: Sit to Supine       Sit to supine: Min assist, Mod assist, HOB elevated   General bed mobility comments: assist for R LE and trunk sit to supine; 2 assist to boost pt up in bed using bed sheet    Transfers Overall transfer level: Needs assistance Equipment used: Rolling walker (2 wheels) Transfers: Sit to/from Stand Sit to Stand: Min assist, Mod assist           General transfer comment: assist to initiate and come to full stand; assist to control descent sitting; vc's for UE/LE placement and overall technique    Ambulation/Gait Ambulation/Gait assistance: Min guard, Min assist Gait Distance (Feet): 3 Feet (recliner to bed) Assistive device: Rolling walker (2 wheels)   Gait velocity: decreased     General Gait  Details: antalgic; pt initially pivoting on L LE but with vc's for increasing UE support through RW to offweight R LE during L LE advancement pt able to progress to taking  short steps with L LE and R LE; increased effort and time to take steps d/t R knee pain  Stairs            Wheelchair Mobility    Modified Rankin (Stroke Patients Only)       Balance Overall balance assessment: Needs assistance Sitting-balance support: No upper extremity supported, Feet supported Sitting balance-Leahy Scale: Fair Sitting balance - Comments: steady static sitting   Standing balance support: Bilateral upper extremity supported, During functional activity Standing balance-Leahy Scale: Poor Standing balance comment: assist to balance initially when standing up to RW d/t posterior lean (vc's and assist to correct)                             Pertinent Vitals/Pain Pain Assessment Pain Assessment: 0-10 Pain Score: 6  Pain Location: R knee (mostly posterior) Pain Descriptors / Indicators: Grimacing, Guarding, Aching, Tender, Sore Pain Intervention(s): Limited activity within patient's tolerance, Monitored during session, Premedicated before session, Repositioned, Other (comment) (RN notified) Vitals (HR and O2 on room air) stable and WFL throughout treatment session.    Home Living Family/patient expects to be discharged to:: Private residence Living Arrangements: Spouse/significant other Available Help at Discharge: Family;Available 24 hours/day Type of Home: House Home Access: Stairs to enter Entrance Stairs-Rails: Left Entrance Stairs-Number of Steps: 3   Home Layout: One level Home Equipment: Rollator (4 wheels);Shower seat;Grab bars - tub/shower;Toilet riser Additional Comments: Daughter lives ~5 min away    Prior Function Prior Level of Function : Independent/Modified Independent;Driving             Mobility Comments: Since discharge from rehab, pt has been using rollator (independent with ambulation prior to recent hospitalizations).  No falls in past 6 months. ADLs Comments: Per OT eval "Independent for ADLs, has a maid that comes  every 2 weeks to clean the house, still driving (pt reported starting transition to driving retirement, husband can drive if needed), husband assists with cooking PRN".     Hand Dominance   Dominant Hand: Right    Extremity/Trunk Assessment   Upper Extremity Assessment Upper Extremity Assessment: Generalized weakness    Lower Extremity Assessment Lower Extremity Assessment: Generalized weakness;RLE deficits/detail RLE Deficits / Details: R knee extension grossly 5-10 degrees short of neutral and 40-50 degrees flexion PROM (limited d/t R knee pain) RLE: Unable to fully assess due to pain    Cervical / Trunk Assessment Cervical / Trunk Assessment: Normal  Communication   Communication: No difficulties  Cognition Arousal/Alertness: Awake/alert Behavior During Therapy: WFL for tasks assessed/performed Overall Cognitive Status: Within Functional Limits for tasks assessed                                 General Comments: A&Ox4        General Comments General comments (skin integrity, edema, etc.): Pt found on RA and SpO2 93% at rest, dropping to 90% on RA with activity, instructed pt in pursed lip breathing and placed back on 2L, improved to >92%. Required rest breaks/deep breathing in between mobillity activities 2/2 R knee pain.  Nursing cleared pt for participation in physical therapy.  Pt agreeable to PT session (pt sitting in recliner and requesting  back to bed).  Pt's daughter and son in law present.    Exercises     Assessment/Plan    PT Assessment Patient needs continued PT services  PT Problem List Decreased strength;Decreased range of motion;Decreased activity tolerance;Decreased balance;Decreased mobility;Decreased knowledge of use of DME;Decreased knowledge of precautions;Pain       PT Treatment Interventions DME instruction;Gait training;Stair training;Functional mobility training;Therapeutic activities;Therapeutic exercise;Balance  training;Patient/family education;Wheelchair mobility training    PT Goals (Current goals can be found in the Care Plan section)  Acute Rehab PT Goals Patient Stated Goal: to improve pain and mobility PT Goal Formulation: With patient/family Time For Goal Achievement: 05/02/22 Potential to Achieve Goals: Good    Frequency Min 2X/week     Co-evaluation               AM-PAC PT "6 Clicks" Mobility  Outcome Measure Help needed turning from your back to your side while in a flat bed without using bedrails?: None Help needed moving from lying on your back to sitting on the side of a flat bed without using bedrails?: A Lot Help needed moving to and from a bed to a chair (including a wheelchair)?: A Little Help needed standing up from a chair using your arms (e.g., wheelchair or bedside chair)?: A Lot Help needed to walk in hospital room?: Total Help needed climbing 3-5 steps with a railing? : Total 6 Click Score: 13    End of Session Equipment Utilized During Treatment: Gait belt Activity Tolerance: Patient limited by pain Patient left: in bed;with call bell/phone within reach;with bed alarm set;with family/visitor present;with nursing/sitter in room Nurse Communication: Mobility status;Precautions;Other (comment) (Pt's pain status; R knee/skin appearing red and blotchy (pt was using home heating pad against skin upon PT entering room so therapist removed heating pad and educated pt/family not to use heating pad until cleared by pt's nurse d/t skin concern issues).) PT Visit Diagnosis: Other abnormalities of gait and mobility (R26.89);Muscle weakness (generalized) (M62.81);Pain Pain - Right/Left: Right Pain - part of body: Knee    Time: 9741-6384 PT Time Calculation (min) (ACUTE ONLY): 37 min   Charges:   PT Evaluation $PT Eval Low Complexity: 1 Low PT Treatments $Gait Training: 8-22 mins       Leitha Bleak, PT 04/18/22, 4:47 PM

## 2022-04-18 NOTE — Progress Notes (Signed)
Initial Nutrition Assessment  DOCUMENTATION CODES:   Severe malnutrition in context of chronic illness  INTERVENTION:   -Liberalize diet to regular for widest variety of meal selections -MVI with minerals daily -Ensure Enlive po TID, each supplement provides 350 kcal and 20 grams of protein.   NUTRITION DIAGNOSIS:   Severe Malnutrition related to chronic illness (pancreatic cancer) as evidenced by moderate fat depletion, severe fat depletion, moderate muscle depletion, severe muscle depletion, percent weight loss.  GOAL:   Patient will meet greater than or equal to 90% of their needs  MONITOR:   PO intake, Supplement acceptance  REASON FOR ASSESSMENT:   Consult Assessment of nutrition requirement/status  ASSESSMENT:   Pt with medical history significant of hypertension, hyperlipidemia, COPD, skin cancer, recently diagnosed pancreatic cancer, s/p of biliary stent placement due to biliary obstruction, seizure, anemia, who presents with generalized weakness.  Pt admitted with generalized weakness, leukocytosis, and pulmonary edema.   Reviewed I/O's: -359 ml x 24 hours  UOP: 650 ml x 24 hours  Per oncology and palliative care notes, pt with poor performance status for chemotherapy.   Case discussed with RN. Pt eating very poorly, mainly bites and sips. She is taking medications without difficulty.   Spoke with pt and daughter. Daughter provided most of the history, as pt was sleepy at time of visit. Per daughter, pt has experienced a general decline in health over the past 2 months, since being hospitalized in October for a prolonged hospitalized, which included an ICU stay on the ventilator. Since being discharge earlier this month, pt continued to decline to the point where she was unable to walk on her own. Prior to illness, pt ate healthfully, due to pt's husband's DM diagnosis, but would often indulge in sweets when he wasn't around. Pt would usually eat about 50% of meals  about a month ago, but would gradually decline from 25% to 1/8th of a meal. Pt has been eating mainly bites and sips while here in the hospital. Pt was drinking Ensure supplements at home, which she liked.   Per daughter, pt's UBW is around 120#. She reports significant wt loss over the past 6 weeks. Pt has experienced a 15.2% wt loss over the past 6 weeks, which is significant for time frame.   Discussed importance of good meal and supplement intake to promote healing.   Medications reviewed and include keppra, magnesium oxide, dilantin, and senokot.   Lab Results  Component Value Date   HGBA1C 5.9 (H) 03/15/2022   PTA DM medications are none.   Labs reviewed: K: 3.4, CBGS: 121 (inpatient orders for glycemic control are none).    NUTRITION - FOCUSED PHYSICAL EXAM:  Flowsheet Row Most Recent Value  Orbital Region Moderate depletion  Upper Arm Region Severe depletion  Thoracic and Lumbar Region Severe depletion  Buccal Region Moderate depletion  Temple Region Moderate depletion  Clavicle Bone Region Severe depletion  Clavicle and Acromion Bone Region Severe depletion  Scapular Bone Region Severe depletion  Dorsal Hand Moderate depletion  Patellar Region Moderate depletion  Anterior Thigh Region Moderate depletion  Posterior Calf Region Moderate depletion  Edema (RD Assessment) Mild  Hair Reviewed  Eyes Reviewed  Mouth Reviewed  Skin Reviewed  Nails Reviewed       Diet Order:   Diet Order             Diet regular Room service appropriate? Yes; Fluid consistency: Thin; Fluid restriction: 1500 mL Fluid  Diet effective now  EDUCATION NEEDS:   Education needs have been addressed  Skin:  Skin Assessment: Reviewed RN Assessment  Last BM:  04/16/22  Height:   Ht Readings from Last 1 Encounters:  04/17/22 '5\' 4"'$  (1.626 m)    Weight:   Wt Readings from Last 1 Encounters:  04/17/22 55.8 kg    Ideal Body Weight:  54.5 kg  BMI:  Body mass  index is 21.11 kg/m.  Estimated Nutritional Needs:   Kcal:  1700-1900  Protein:  85-100 grams  Fluid:  > 1.7 L    Loistine Chance, RD, LDN, [place] Registered Dietitian II Certified Diabetes Care and Education Specialist Please refer to [facility] for RD and/or RD on-call/weekend/after hours pager

## 2022-04-18 NOTE — Assessment & Plan Note (Signed)
Replaced 

## 2022-04-18 NOTE — Assessment & Plan Note (Signed)
We will give warm compresses.  Conservative management with her pancreatic cancer history.  Pain control.

## 2022-04-18 NOTE — Assessment & Plan Note (Signed)
Replace magnesium IV

## 2022-04-18 NOTE — Progress Notes (Signed)
Progress Note   Patient: Shirley Nichols [id number] DOB: 1941-04-22 DOA: 04/17/2022     0 DOS: the patient was seen and examined on 04/18/2022   Brief hospital course: 81 year old female with past medical history of hypertension, hyperlipidemia, COPD, skin cancer, recently diagnosed pancreatic cancer status post middle biliary stent in the common bile duct, seizure, anemia.  She presents with generalized weakness and pains.  She was found to have a Baker's cyst in the right leg.  Assessment and Plan: * General weakness PT and OT evaluations.  We will give a fluid bolus.  May end up needing a blood transfusion if hemoglobin drops no further  Baker's cyst of knee, right We will give warm compresses.  Conservative management with her pancreatic cancer history.  Pain control.  Anemia of chronic disease May end up needing a blood transfusion if hemoglobin drops down further.  Today's hemoglobin 7.4.  We will get a type and cross in the morning.  Pancreatic cancer [facility]) Appreciate oncology and palliative care consultations.  Hypomagnesemia Replace magnesium IV  Hypokalemia Replace potassium orally  Hyperlipidemia On Zetia  Seizure (HCC) On Keppra and Dilantin  Elevated LFTs Bilirubin normal range.  Case discussed with gastroenterology.  Metal stents can stay on lifelong.  With bilirubin normal for stent is functioning.  Malnutrition of moderate degree Continue supplements.        Subjective: Patient has some pain in the right knee.  Family brought her in because she has been increasingly weak.  Has received some fluid boluses as outpatient.  Has a history of pancreatic cancer with recent metal stent.  Physical Exam: Vitals:   04/18/22 0142 04/18/22 0407 04/18/22 0819 04/18/22 1205  BP: (!) 155/66 (!) 154/70 (!) 155/60 (!) 144/63  Pulse: 87 83 79 76  Resp: '20 18 18 18  '$ Temp: 99.5 F (37.5 C) 99.1 F (37.3 C) 98.7 F (37.1 C) 98.7 F (37.1 C)  TempSrc: Oral  Oral  Oral  SpO2: 92% 98% 99% 93%  Weight:      Height:       Physical Exam HENT:     Head: Normocephalic.     Mouth/Throat:     Pharynx: No oropharyngeal exudate.  Eyes:     General: Lids are normal.     Conjunctiva/sclera: Conjunctivae normal.  Cardiovascular:     Rate and Rhythm: Normal rate and regular rhythm.     Heart sounds: Normal heart sounds, S1 normal and S2 normal.  Pulmonary:     Breath sounds: No decreased breath sounds, wheezing, rhonchi or rales.  Abdominal:     Palpations: Abdomen is soft.     Tenderness: There is no abdominal tenderness.  Musculoskeletal:     Right knee: Swelling present.     Right lower leg: No swelling.     Left lower leg: No swelling.     Comments: Some pain to palpation behind the right knee.  Skin:    General: Skin is warm.     Findings: No rash.  Neurological:     Mental Status: She is alert and oriented to person, place, and time.     Data Reviewed: Total bilirubin 0.7, AST 100 ALT 124, magnesium 1.7, creatinine 0.8, potassium 3.4, white blood cell count 12.9, hemoglobin 7.5, platelet count 365  Family Communication: With patient's husband and daughter at the bedside  Disposition: Status is: Inpatient Remains inpatient appropriate because: Will need physical therapy evaluation.  May end up needing blood during this hospitalization.  Planned  Discharge Destination: Home    Time spent: 28 minutes  Author: Loletha Grayer, MD 04/18/2022 2:16 PM  For on call review [email or web address].

## 2022-04-18 NOTE — Progress Notes (Addendum)
[facility]  [phone] [phone]  ID: Shirley Nichols OB: 08-07-1940  MR#: [id number]  WUG#:[id number]  Patient Care Team: Remi Haggard, FNP as PCP - General (Family Medicine) Clent Jacks, RN as Oncology Nurse Navigator   HPI: Shirley Nichols is a 81 y.o. female with recent diagnosis of localized pancreatic cancer presented to ED on 04/17/2022 for worsening generalized weakness.   Patient has reduced appetite and very low energy levels.  Patient denies any fevers, chills, vomiting or abdominal pain.  For few days she has been having right shoulder and right feet pain.  On admission, CBC showed WBC 15.2, hemoglobin 8.3 and platelet 364.  Potassium 3.1, albumin 2.4, AST 100, ALT 124, ALP 163 and total bilirubin normal.  She had CT abdomen pelvis which showed a stable pancreatic mass with associated pancreatic duct dilatation and metallic stent in place.  Venous Doppler showed fluid collection in the popliteal fossa possibly Baker's cyst.   Patient recently had prolonged hospital course end of October 2023 for biliary obstruction status post sphincterotomy and metal stent placement, complicated by metabolic encephalopathy with concern for seizure required intubation.  Interval history Patient was seen today at bedside with husband.  She continues to feel very tired and low motivation to sit up in the chair.  She complain of right knee pain and had received oxycodone.  She was little sleepy.  Son is concerned that a lot of her symptoms are coming from seizure medication.  REVIEW OF SYSTEMS:   ROS  As per HPI. Otherwise, a complete review of systems is negative.  PAST MEDICAL HISTORY: Past Medical History:  Diagnosis Date   Cancer ([place])    skin cancer   Hyperlipidemia    Hypertension     PAST SURGICAL HISTORY: Past Surgical History:  Procedure Laterality Date   ABDOMINAL HYSTERECTOMY     BILIARY BRUSHING  03/05/2022   Procedure:  BILIARY BRUSHING;  Surgeon: Carol Ada, MD;  Location: [facility] ENDOSCOPY;  Service: Gastroenterology;;   BILIARY STENT PLACEMENT  03/05/2022   Procedure: BILIARY STENT PLACEMENT;  Surgeon: Carol Ada, MD;  Location: [facility] ENDOSCOPY;  Service: Gastroenterology;;   COLONOSCOPY     02/21/2002 adenomatous polyp, 03/23/2011   COLONOSCOPY W/ POLYPECTOMY  02/2000   TA polyp removed from ascending colon.  performed through [facility] (? affiliate in [place])   COLONOSCOPY WITH PROPOFOL N/A 11/03/2016   Procedure: COLONOSCOPY WITH PROPOFOL;  Surgeon: Manya Silvas, MD;  Location: [facility] ENDOSCOPY;  Service: Endoscopy;  Laterality: N/A;   ERCP N/A 03/05/2022   Procedure: ENDOSCOPIC RETROGRADE CHOLANGIOPANCREATOGRAPHY (ERCP);  Surgeon: Carol Ada, MD;  Location: [place];  Service: Gastroenterology;  Laterality: N/A;   HEMORROIDECTOMY     TONSILLECTOMY      FAMILY HISTORY: Family History  Problem Relation Age of Onset   Seizures Sister     HEALTH MAINTENANCE: Social History   Tobacco Use   Smoking status: Former   Smokeless tobacco: Never  Substance Use Topics   Alcohol use: No   Drug use: No     No Known Allergies  Current Facility-Administered Medications  Medication Dose Route Frequency Provider Last Rate Last Admin   albuterol (PROVENTIL) (2.5 MG/3ML) 0.083% nebulizer solution 3 mL  3 mL Inhalation Q4H PRN Ivor Costa, MD       aspirin EC tablet 81 mg  81 mg Oral Daily Ivor Costa, MD   81 mg at 04/18/22 0842   ezetimibe (ZETIA) tablet 10 mg  10  mg Oral QPM Ivor Costa, MD   10 mg at 04/17/22 1900   feeding supplement (ENSURE ENLIVE / ENSURE PLUS) liquid 237 mL  237 mL Oral TID BM Wieting, Richard, MD       hydrALAZINE (APRESOLINE) injection 5 mg  5 mg Intravenous Q2H PRN Ivor Costa, MD       levETIRAcetam (KEPPRA) tablet 750 mg  750 mg Oral BID Ivor Costa, MD   750 mg at 04/18/22 0844   LORazepam (ATIVAN) injection 1 mg  1 mg Intravenous Q2H PRN Ivor Costa, MD       magnesium  oxide (MAG-OX) tablet 800 mg  800 mg Oral BID Ivor Costa, MD   800 mg at 04/18/22 4742   mometasone-formoterol (DULERA) 200-5 MCG/ACT inhaler 2 puff  2 puff Inhalation BID Ivor Costa, MD   2 puff at 04/18/22 0845   multivitamin with minerals tablet 1 tablet  1 tablet Oral Daily Wieting, Richard, MD       Muscle Rub CREA   Topical PRN Loletha Grayer, MD       ondansetron [facility]) injection 4 mg  4 mg Intravenous Q8H PRN Ivor Costa, MD       oxyCODONE (Oxy IR/ROXICODONE) immediate release tablet 2.5 mg  2.5 mg Oral Q6H PRN Foust, Katy L, NP   2.5 mg at 04/18/22 1010   [START ON 04/19/2022] phenytoin (DILANTIN) ER capsule 100 mg  100 mg Oral QHS Wieting, Richard, MD       phenytoin (DILANTIN) ER capsule 200 mg  200 mg Oral QHS Loletha Grayer, MD   200 mg at 04/17/22 2137   polyethylene glycol (MIRALAX / GLYCOLAX) packet 17 g  17 g Oral Daily PRN Ivor Costa, MD       senna-docusate (Senokot-S) tablet 1 tablet  1 tablet Oral BID Ivor Costa, MD   1 tablet at 04/18/22 0842    OBJECTIVE: Vitals:   04/18/22 0819 04/18/22 1205  BP: (!) 155/60 (!) 144/63  Pulse: 79 76  Resp: 18 18  Temp: 98.7 F (37.1 C) 98.7 F (37.1 C)  SpO2: 99% 93%     Body mass index is 21.11 kg/m.      General: Well-developed, well-nourished, no acute distress. Eyes: Pink conjunctiva, anicteric sclera. HEENT: Normocephalic, moist mucous membranes, clear oropharnyx. Lungs: Clear to auscultation bilaterally. Heart: Regular rate and rhythm. No rubs, murmurs, or gallops. Abdomen: Soft, nontender, nondistended. No organomegaly noted, normoactive bowel sounds. Musculoskeletal: No edema, cyanosis, or clubbing. Neuro: Alert, answering all questions appropriately. Cranial nerves grossly intact. Skin: No rashes or petechiae noted. Psych: Normal affect. Lymphatics: No cervical, calvicular, axillary or inguinal LAD.   LAB RESULTS:  Lab Results  Component Value Date   NA 136 04/18/2022   K 3.4 (L) 04/18/2022   CL 101  04/18/2022   CO2 28 04/18/2022   GLUCOSE 115 (H) 04/18/2022   BUN 17 04/18/2022   CREATININE 0.80 04/18/2022   CALCIUM 8.1 (L) 04/18/2022   PROT 6.9 04/17/2022   ALBUMIN 2.4 (L) 04/17/2022   AST 100 (H) 04/17/2022   ALT 124 (H) 04/17/2022   ALKPHOS 163 (H) 04/17/2022   BILITOT 0.7 04/17/2022   GFRNONAA >60 04/18/2022    Lab Results  Component Value Date   WBC 12.9 (H) 04/18/2022   NEUTROABS 7.1 04/14/2022   HGB 7.5 (L) 04/18/2022   HCT 22.6 (L) 04/18/2022   MCV 79.6 (L) 04/18/2022   PLT 365 04/18/2022    Lab Results  Component Value Date  FERRITIN 559 (H) 04/14/2022     STUDIES: ECHOCARDIOGRAM COMPLETE  Result Date: 04/18/2022    ECHOCARDIOGRAM REPORT   Patient Name:   JAKERIA CAISSIE Brockmann Date of Exam: 04/18/2022 Medical Rec #:  [id number]        Height:       64.0 in Accession #:    [id number]       Weight:       123.0 lb Date of Birth:  1940/11/03         BSA:          1.591 m Patient Age:    48 years         BP:           154/70 mmHg Patient Gender: F                HR:           83 bpm. Exam Location:  [facility] Procedure: 2D Echo, Cardiac Doppler and Color Doppler Indications:     CHF-acute diastolic T55.73  History:         Patient has no prior history of Echocardiogram examinations.                  Risk Factors:Hypertension and Dyslipidemia.  Sonographer:     Sherrie Sport Referring Phys:  [id number] Soledad Gerlach NIU Diagnosing Phys: Serafina Royals MD  Sonographer Comments: Image quality was good. IMPRESSIONS  1. Left ventricular ejection fraction, by estimation, is 60 to 65%. The left ventricle has normal function. The left ventricle has no regional wall motion abnormalities. Left ventricular diastolic parameters were normal.  2. Right ventricular systolic function is normal. The right ventricular size is normal.  3. The mitral valve is normal in structure. Trivial mitral valve regurgitation.  4. The aortic valve is normal in structure. Aortic valve regurgitation is not visualized. FINDINGS  Left  Ventricle: Left ventricular ejection fraction, by estimation, is 60 to 65%. The left ventricle has normal function. The left ventricle has no regional wall motion abnormalities. The left ventricular internal cavity size was normal in size. There is  no left ventricular hypertrophy. Left ventricular diastolic parameters were normal. Right Ventricle: The right ventricular size is normal. No increase in right ventricular wall thickness. Right ventricular systolic function is normal. Left Atrium: Left atrial size was normal in size. Right Atrium: Right atrial size was normal in size. Pericardium: There is no evidence of pericardial effusion. Mitral Valve: The mitral valve is normal in structure. Trivial mitral valve regurgitation. Tricuspid Valve: The tricuspid valve is normal in structure. Tricuspid valve regurgitation is trivial. Aortic Valve: The aortic valve is normal in structure. Aortic valve regurgitation is not visualized. Aortic valve mean gradient measures 5.0 mmHg. Aortic valve peak gradient measures 9.5 mmHg. Aortic valve area, by VTI measures 2.83 cm. Pulmonic Valve: The pulmonic valve was normal in structure. Pulmonic valve regurgitation is trivial. Aorta: The aortic root and ascending aorta are structurally normal, with no evidence of dilitation. IAS/Shunts: No atrial level shunt detected by color flow Doppler.  LEFT VENTRICLE PLAX 2D LVIDd:         4.10 cm   Diastology LVIDs:         3.00 cm   LV e' medial:    4.68 cm/s LV PW:         1.10 cm   LV E/e' medial:  19.8 LV IVS:        1.00 cm   LV e' lateral:  7.40 cm/s LVOT diam:     2.10 cm   LV E/e' lateral: 12.5 LV SV:         77 LV SV Index:   48 LVOT Area:     3.46 cm  RIGHT VENTRICLE RV Basal diam:  2.60 cm RV Mid diam:    2.00 cm RV S prime:     13.90 cm/s TAPSE (M-mode): 2.5 cm LEFT ATRIUM             Index        RIGHT ATRIUM           Index LA diam:        3.20 cm 2.01 cm/m   RA Area:     14.50 cm LA Vol (A2C):   43.7 ml 27.46 ml/m  RA  Volume:   32.70 ml  20.55 ml/m LA Vol (A4C):   27.1 ml 17.03 ml/m LA Biplane Vol: 37.0 ml 23.25 ml/m  AORTIC VALVE AV Area (Vmax):    2.52 cm AV Area (Vmean):   2.43 cm AV Area (VTI):     2.83 cm AV Vmax:           154.00 cm/s AV Vmean:          102.000 cm/s AV VTI:            0.272 m AV Peak Grad:      9.5 mmHg AV Mean Grad:      5.0 mmHg LVOT Vmax:         112.00 cm/s LVOT Vmean:        71.500 cm/s LVOT VTI:          0.222 m LVOT/AV VTI ratio: 0.82  AORTA Ao Root diam: 3.30 cm MITRAL VALVE                TRICUSPID VALVE MV Area (PHT): 3.77 cm     TR Peak grad:   14.3 mmHg MV Decel Time: 201 msec     TR Vmax:        189.00 cm/s MV E velocity: 92.80 cm/s MV A velocity: 129.00 cm/s  SHUNTS MV E/A ratio:  0.72         Systemic VTI:  0.22 m                             Systemic Diam: 2.10 cm Serafina Royals MD Electronically signed by Serafina Royals MD Signature Date/Time: 04/18/2022/12:49:06 PM    Final    CT ABDOMEN PELVIS W CONTRAST  Result Date: 04/17/2022 CLINICAL DATA:  Recently diagnosed pancreatic cancer. Recent sphincterotomy and biliary stent placement. Progressive weakness. * Tracking Code: BO * EXAM: CT ABDOMEN AND PELVIS WITH CONTRAST TECHNIQUE: Multidetector CT imaging of the abdomen and pelvis was performed using the standard protocol following bolus administration of intravenous contrast. RADIATION DOSE REDUCTION: This exam was performed according to the departmental dose-optimization program which includes automated exposure control, adjustment of the mA and/or kV according to patient size and/or use of iterative reconstruction technique. CONTRAST:  162m OMNIPAQUE IOHEXOL 300 MG/ML  SOLN COMPARISON:  Abdominopelvic CT 04/05/2022. Abdominal MRI 03/03/2022. FINDINGS: Lower chest: Patchy atelectasis at both lung bases. No pleural effusion or suspicious pulmonary nodule. There is atherosclerosis of the aorta and coronary arteries. Hepatobiliary: Metallic biliary stent remains in place with  persistent pneumobilia. The gallbladder is decompressed, without wall thickening or stones. Unchanged appearance of the liver without suspicious abnormality. Pancreas:  Known hypodense mass involving the pancreatic head is similar to previous study, measuring approximately 2.1 x 1.3 cm on image 29/3. Stable pancreatic ductal dilatation and mild surrounding inflammation. Spleen: Normal in size without focal abnormality. Adrenals/Urinary Tract: Unchanged 2.2 cm left adrenal nodule which was previously characterized by noncontrast CT as an adenoma. No follow-up imaging recommended. The right adrenal gland appears normal. No evidence of urinary tract calculus, suspicious renal lesion or hydronephrosis. Unchanged bilateral renal cysts; no follow-up imaging recommended. The bladder appears unremarkable for its degree of distention. Stomach/Bowel: No enteric contrast administered. The stomach appears unremarkable for its degree of distension. No evidence of bowel wall thickening, distention or surrounding inflammatory change. Vascular/Lymphatic: There are no enlarged abdominal or pelvic lymph nodes. Diffuse aortic and branch vessel atherosclerosis without evidence of aneurysm or large vessel occlusion. No gross vascular encasement identified in the porta hepatis. Reproductive: Status post hysterectomy.  No adnexal mass. Other: No ascites, free air or peritoneal nodularity. Previously noted small subcutaneous nodule within the left anterior abdominal wall has slightly decreased in size and may be related to previous subcutaneous injection. Musculoskeletal: No acute or significant osseous findings. Facet hypertrophy in the lower lumbar spine with a degenerative grade 1 anterolisthesis at L4-5. IMPRESSION: 1. No acute findings or clear explanation for the patient's symptoms. 2. Stable appearance of the known pancreatic head mass with associated pancreatic ductal dilatation and pneumobilia following biliary stenting. No  evidence of metastatic disease. 3. Aortic Atherosclerosis (ICD10-I70.0). Electronically Signed   By: Richardean Sale M.D.   On: 04/17/2022 16:43   US Venous Img Lower Unilateral Right  Result Date: 04/17/2022 CLINICAL DATA:  Swelling EXAM: Right LOWER EXTREMITY VENOUS DOPPLER ULTRASOUND TECHNIQUE: Gray-scale sonography with compression, as well as color and duplex ultrasound, were performed to evaluate the deep venous system(s) from the level of the common femoral vein through the popliteal and proximal calf veins. COMPARISON:  None Available. FINDINGS: VENOUS Normal compressibility of the common femoral, superficial femoral, and popliteal veins, as well as the visualized calf veins. Visualized portions of profunda femoral vein and great saphenous vein unremarkable. No filling defects to suggest DVT on grayscale or color Doppler imaging. Doppler waveforms show normal direction of venous flow, normal respiratory plasticity and response to augmentation. OTHER In the popliteal fossa there is a 4.5 x 1.4 x 2.8 cm heterogenous oval fluid collection. There is no flow within or around this collection. This is incompletely assessed on the provided images. Limitations: none IMPRESSION: 1. No evidence of right lower extremity DVT. 2. In the popliteal fossa there is a 4.5 x 1.4 x 2.8 cm heterogenous oval fluid collection, which could represent Baker's cyst with hemorrhage or synovitis. Electronically Signed   By: Marin Roberts M.D.   On: 04/17/2022 10:26   DG Chest Portable 1 View  Result Date: 04/17/2022 CLINICAL DATA:  Weakness and fatigue in a female at age 36. EXAM: PORTABLE CHEST 1 VIEW COMPARISON:  March 14, 2022. FINDINGS: EKG leads project over the chest. Cardiomediastinal contours and hilar structures with persistent cardiac enlargement. Increased interstitial markings improved compared to most recent comparison imaging. No sign of lobar consolidative process. No pneumothorax. On limited assessment no  acute skeletal findings. IMPRESSION: Cardiomegaly with improved interstitial markings compared to most recent comparison imaging. No sign of lobar consolidative process. Difficult to exclude mild interstitial edema, difficult to differentiate from background chronic changes. Electronically Signed   By: Zetta Bills M.D.   On: 04/17/2022 09:34   CT ABD PELVIS W/WO  CM ONCOLOGY PANCREATIC PROTOCOL  Result Date: 04/07/2022 CLINICAL DATA:  Pancreatic head adenocarcinoma diagnosed on 03/05/2022 ERCP. Staging. * Tracking Code: BO * EXAM: CT ABDOMEN AND PELVIS WITHOUT AND WITH CONTRAST TECHNIQUE: Multidetector CT imaging of the abdomen and pelvis was performed following the standard protocol before and following the bolus administration of intravenous contrast. RADIATION DOSE REDUCTION: This exam was performed according to the departmental dose-optimization program which includes automated exposure control, adjustment of the mA and/or kV according to patient size and/or use of iterative reconstruction technique. CONTRAST:  60m OMNIPAQUE IOHEXOL 300 MG/ML  SOLN COMPARISON:  03/03/2022 MRI abdomen and unenhanced CT abdomen/pelvis FINDINGS: Lower chest: No significant pulmonary nodules or acute consolidative airspace disease. Coronary atherosclerosis. Hepatobiliary: Normal liver size. Subcapsular hypodense 0.8 cm far inferior right liver lesion (series 9/image 57), too small to characterize, stable from 03/03/2022 MRI abdomen study, where it was T2 hyperintense and compatible with a tiny benign cyst. No additional liver lesions. Metallic CBD stent in place extending from the mid CBD to the descending duodenal lumen. Pneumobilia throughout the intrahepatic bile ducts and nondistended gallbladder indicating stent patency. Proximal CBD diameter 7 mm. Mild central intrahepatic biliary ductal dilatation. Pancreas: Poorly defined hypoenhancing 2.9 x 2.4 cm pancreatic head mass (series 9/image 47), previously 2.9 x 2.3 cm on  03/03/2022 MRI using similar measurement technique, not appreciably changed. No clear tumor involvement of the SMV, portal veins or SMA. Dilated main pancreatic duct (6 mm diameter) with abrupt caliber transition at the level of the pancreatic head. Diffuse haziness of the peripancreatic fat without measurable peripancreatic fluid collections. No evidence of pancreatic parenchymal necrosis. Tiny 1.2 x 0.6 cm pancreatic body pseudocyst (series 9/image 44), slightly increased from 0.8 x 0.4 cm on 03/03/2022 MRI. Spleen: Normal size. No mass. Adrenals/Urinary Tract: Left adrenal 2.2 x 1.8 cm nodule with precontrast density 4 HU, compatible with an adenoma. Mildly irregular right adrenal gland is unchanged, without discrete right adrenal nodules. No hydronephrosis. Small simple bilateral renal cysts, largest 1.4 cm in the anterior upper right kidney. No suspicious renal masses. Normal bladder. Stomach/Bowel: Small hiatal hernia. Otherwise normal nondistended stomach. Normal caliber small bowel with no small bowel wall thickening. Appendix not discretely visualized. Normal large bowel with no diverticulosis, large bowel wall thickening or pericolonic fat stranding. Vascular/Lymphatic: Atherosclerotic nonaneurysmal abdominal aorta. Patent portal, splenic, hepatic and renal veins. No pathologically enlarged lymph nodes in the abdomen or pelvis. Reproductive: Status post hysterectomy, with no abnormal findings at the vaginal cuff. No adnexal mass. Other: No pneumoperitoneum, ascites or focal fluid collection. Nonspecific subcutaneous 0.8 cm ventral left abdominal wall soft tissue density nodule (series 9/image 85), new since 03/03/2022 CT. Musculoskeletal: No aggressive appearing focal osseous lesions. Mild thoracolumbar spondylosis. IMPRESSION: 1. Poorly defined hypoenhancing 2.9 cm pancreatic head mass, not appreciably changed since 03/03/2022 MRI, compatible with known primary pancreatic adenocarcinoma. No evidence of  vascular invasion by the pancreatic tumor. 2. Dilated main pancreatic duct with abrupt caliber transition at the level of the pancreatic head. Well-positioned CBD stent with pneumobilia indicating stent patency. 3. No lymphadenopathy or other findings highly suspicious for metastatic disease in the abdomen or pelvis. Nonspecific subcutaneous 0.8 cm ventral left abdominal wall soft tissue density nodule, new since 03/03/2022 CT, most commonly due to subcutaneous injections. 4. Evidence of ongoing pancreatitis. Tiny 1.2 x 0.6 cm pancreatic body pseudocyst, slightly increased. 5. 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 6. Small hiatal hernia. 7. Coronary atherosclerosis. 8.  Aortic Atherosclerosis (ICD10-I70.0). Electronically  Signed   By: Ilona Sorrel M.D.   On: 04/07/2022 16:08    ASSESSMENT AND PLAN:   TEARRA OUK is a 81 y.o. female with pmh with recent diagnosis of localized pancreatic cancer, prolonged hospitalization in October 2023 biliary obstruction status post sphincterotomy and metal stent complicated by metabolic encephalopathy requiring intubation presented to ED on 04/17/2022 for worsening generalized weakness.   CT abdomen pelvis showed stable pancreatic mass with pancreatic ductal dilatation and metallic stent in place.  She has right knee pain which could be from Baker's cyst.  She is on pain medications.  I reiterated the information to the patient and the husband again what we discussed yesterday.  She continues to feel very weak to be able to tolerate any treatment.  Husband is concerned that antiseizure medication might be causing a lot of her symptoms.  It is less likely.  Also she is on tapering dose per her neurologist.  I expressed my concerns about patient's functional status and the possibility that she may not be able to recover back to her baseline prior to cancer diagnosis.  Also she continues not be a candidate for chemotherapy at this time.   Patient expressed that she would like to focus on her quality.  However she and her husband are hopeful that with continued PT she will be able to reach a point where she can at least try 1 dose of chemotherapy.  Will continue to follow the course.   Patient expressed understanding and was in agreement with this plan. She also understands that She can call clinic at any time with any questions, concerns, or complaints.   I spent a total of 35 minutes reviewing chart data, face-to-face evaluation with the patient, counseling and coordination of care as detailed above.  Jane Canary, MD   04/18/2022 4:33 PM

## 2022-04-18 NOTE — Assessment & Plan Note (Signed)
On Zetia

## 2022-04-18 NOTE — Assessment & Plan Note (Addendum)
PT and OT evaluations.  We will give a fluid bolus.  May end up needing a blood transfusion if hemoglobin drops no further

## 2022-04-19 DIAGNOSIS — C250 Malignant neoplasm of head of pancreas: Secondary | ICD-10-CM | POA: Diagnosis not present

## 2022-04-19 DIAGNOSIS — R531 Weakness: Secondary | ICD-10-CM | POA: Diagnosis not present

## 2022-04-19 DIAGNOSIS — R7989 Other specified abnormal findings of blood chemistry: Secondary | ICD-10-CM | POA: Diagnosis not present

## 2022-04-19 DIAGNOSIS — E43 Unspecified severe protein-calorie malnutrition: Secondary | ICD-10-CM | POA: Insufficient documentation

## 2022-04-19 LAB — COMPREHENSIVE METABOLIC PANEL
ALT: 207 U/L — ABNORMAL HIGH (ref 0–44)
AST: 177 U/L — ABNORMAL HIGH (ref 15–41)
Albumin: 2.1 g/dL — ABNORMAL LOW (ref 3.5–5.0)
Alkaline Phosphatase: 188 U/L — ABNORMAL HIGH (ref 38–126)
Anion gap: 9 (ref 5–15)
BUN: 20 mg/dL (ref 8–23)
CO2: 26 mmol/L (ref 22–32)
Calcium: 8.7 mg/dL — ABNORMAL LOW (ref 8.9–10.3)
Chloride: 100 mmol/L (ref 98–111)
Creatinine, Ser: 0.74 mg/dL (ref 0.44–1.00)
GFR, Estimated: >60 mL/min (ref >60)
Glucose, Bld: 127 mg/dL — ABNORMAL HIGH (ref 70–99)
Potassium: 3.5 mmol/L (ref 3.5–5.1)
Sodium: 135 mmol/L (ref 135–145)
Total Bilirubin: 0.6 mg/dL (ref 0.3–1.2)
Total Protein: 6.4 g/dL — ABNORMAL LOW (ref 6.5–8.1)

## 2022-04-19 LAB — CBC
HCT: 24.9 % — ABNORMAL LOW (ref 36.0–46.0)
Hemoglobin: 8 g/dL — ABNORMAL LOW (ref 12.0–15.0)
MCH: 25.6 pg — ABNORMAL LOW (ref 26.0–34.0)
MCHC: 32.1 g/dL (ref 30.0–36.0)
MCV: 79.6 fL — ABNORMAL LOW (ref 80.0–100.0)
Platelets: 414 10*3/uL — ABNORMAL HIGH (ref 150–400)
RBC: 3.13 MIL/uL — ABNORMAL LOW (ref 3.87–5.11)
RDW: 15.1 % (ref 11.5–15.5)
WBC: 11.7 10*3/uL — ABNORMAL HIGH (ref 4.0–10.5)
nRBC: 0 % (ref 0.0–0.2)

## 2022-04-19 LAB — MAGNESIUM: Magnesium: 1.7 mg/dL (ref 1.7–2.4)

## 2022-04-19 LAB — PHENYTOIN LEVEL, TOTAL: Phenytoin Lvl: 3.2 ug/mL — ABNORMAL LOW (ref 10.0–20.0)

## 2022-04-19 MED ORDER — ACETAMINOPHEN 325 MG PO TABS
650.0000 mg | ORAL_TABLET | Freq: Four times a day (QID) | ORAL | Status: DC | PRN
  Administered 2022-04-19 – 2022-04-20 (×2): 650 mg via ORAL
  Filled 2022-04-19 (×3): qty 2

## 2022-04-19 MED ORDER — DOCUSATE SODIUM 100 MG PO CAPS
200.0000 mg | ORAL_CAPSULE | Freq: Two times a day (BID) | ORAL | Status: DC
  Administered 2022-04-19 – 2022-04-22 (×7): 200 mg via ORAL
  Filled 2022-04-19 (×7): qty 2

## 2022-04-19 MED ORDER — MORPHINE SULFATE (PF) 2 MG/ML IV SOLN: 1.0000 mg | INTRAVENOUS | Status: DC | PRN

## 2022-04-19 NOTE — Progress Notes (Signed)
OT Cancellation Note  Patient Details Name: Shirley Nichols MRN: [id number] DOB: August 20, 1940   Cancelled Treatment:    Reason Eval/Treat Not Completed: Other (comment). Pt asleep upon arrival, however, woke easily to voice. Husband present in room and attempting to encourage pt to participate in therapy. Pt requesting OT come back later. Will re-attempt as able.   Doneta Public 04/19/2022, 4:41 PM

## 2022-04-19 NOTE — Progress Notes (Signed)
[place]  [phone]) [phone] [phone]) [phone]  ID: Shirley Nichols OB: 1941/02/08  MR#: [id number]  PIR#:[id number]  Patient Care Team: Remi Haggard, FNP as PCP - General (Family Medicine) Clent Jacks, RN as Oncology Nurse Navigator   HPI: Shirley Nichols is a 81 y.o. female with recent diagnosis of localized pancreatic cancer presented to ED on 04/17/2022 for worsening generalized weakness.   Patient has reduced appetite and very low energy levels.  Patient denies any fevers, chills, vomiting or abdominal pain.  For few days she has been having right shoulder and right feet pain.  On admission, CBC showed WBC 15.2, hemoglobin 8.3 and platelet 364.  Potassium 3.1, albumin 2.4, AST 100, ALT 124, ALP 163 and total bilirubin normal.  She had CT abdomen pelvis which showed a stable pancreatic mass with associated pancreatic duct dilatation and metallic stent in place.  Venous Doppler showed fluid collection in the popliteal fossa possibly Baker's cyst.   Patient recently had prolonged hospital course end of October 2023 for biliary obstruction status post sphincterotomy and metal stent placement, complicated by metabolic encephalopathy with concern for seizure required intubation.  Interval history Patient was seen today at bedside with daughter.  She denies any pain in her right knee today.  She sat in the chair for about an hour.  Her energy level continues to be low.  REVIEW OF SYSTEMS:   ROS  As per HPI. Otherwise, a complete review of systems is negative.  PAST MEDICAL HISTORY: Past Medical History:  Diagnosis Date   Cancer ([place])    skin cancer   Hyperlipidemia    Hypertension     PAST SURGICAL HISTORY: Past Surgical History:  Procedure Laterality Date   ABDOMINAL HYSTERECTOMY     BILIARY BRUSHING  03/05/2022   Procedure: BILIARY BRUSHING;  Surgeon: Carol Ada, MD;  Location: [facility] ENDOSCOPY;  Service: Gastroenterology;;   BILIARY  STENT PLACEMENT  03/05/2022   Procedure: BILIARY STENT PLACEMENT;  Surgeon: Carol Ada, MD;  Location: [facility] ENDOSCOPY;  Service: Gastroenterology;;   COLONOSCOPY     02/21/2002 adenomatous polyp, 03/23/2011   COLONOSCOPY W/ POLYPECTOMY  02/2000   TA polyp removed from ascending colon.  performed through [facility] (? affiliate in [place])   COLONOSCOPY WITH PROPOFOL N/A 11/03/2016   Procedure: COLONOSCOPY WITH PROPOFOL;  Surgeon: Manya Silvas, MD;  Location: [facility] ENDOSCOPY;  Service: Endoscopy;  Laterality: N/A;   ERCP N/A 03/05/2022   Procedure: ENDOSCOPIC RETROGRADE CHOLANGIOPANCREATOGRAPHY (ERCP);  Surgeon: Carol Ada, MD;  Location: [place];  Service: Gastroenterology;  Laterality: N/A;   HEMORROIDECTOMY     TONSILLECTOMY      FAMILY HISTORY: Family History  Problem Relation Age of Onset   Seizures Sister     HEALTH MAINTENANCE: Social History   Tobacco Use   Smoking status: Former   Smokeless tobacco: Never  Substance Use Topics   Alcohol use: No   Drug use: No     No Known Allergies  Current Facility-Administered Medications  Medication Dose Route Frequency Provider Last Rate Last Admin   acetaminophen (TYLENOL) tablet 650 mg  650 mg Oral Q6H PRN Wyvonnia Dusky, MD   650 mg at 04/19/22 0941   albuterol (PROVENTIL) (2.5 MG/3ML) 0.083% nebulizer solution 3 mL  3 mL Inhalation Q4H PRN Ivor Costa, MD       aspirin EC tablet 81 mg  81 mg Oral Daily Ivor Costa, MD   81 mg at 04/19/22 0941   docusate sodium (  COLACE) capsule 200 mg  200 mg Oral BID Wyvonnia Dusky, MD   200 mg at 04/19/22 0946   ezetimibe (ZETIA) tablet 10 mg  10 mg Oral QPM Ivor Costa, MD   10 mg at 04/18/22 2147   feeding supplement (ENSURE ENLIVE / ENSURE PLUS) liquid 237 mL  237 mL Oral TID BM Loletha Grayer, MD   237 mL at 04/19/22 0942   hydrALAZINE (APRESOLINE) injection 5 mg  5 mg Intravenous Q2H PRN Ivor Costa, MD   5 mg at 04/19/22 0353   levETIRAcetam (KEPPRA) tablet 750 mg   750 mg Oral BID Ivor Costa, MD   750 mg at 04/19/22 0940   LORazepam (ATIVAN) injection 1 mg  1 mg Intravenous Q2H PRN Ivor Costa, MD       magnesium oxide (MAG-OX) tablet 800 mg  800 mg Oral BID Ivor Costa, MD   800 mg at 04/19/22 0941   mometasone-formoterol (DULERA) 200-5 MCG/ACT inhaler 2 puff  2 puff Inhalation BID Ivor Costa, MD   2 puff at 04/19/22 0943   morphine (PF) 2 MG/ML injection 1 mg  1 mg Intravenous Q4H PRN Wyvonnia Dusky, MD       multivitamin with minerals tablet 1 tablet  1 tablet Oral Daily Loletha Grayer, MD   1 tablet at 04/19/22 0941   Muscle Rub CREA   Topical PRN Loletha Grayer, MD       ondansetron [facility]) injection 4 mg  4 mg Intravenous Q8H PRN Ivor Costa, MD       oxyCODONE (Oxy IR/ROXICODONE) immediate release tablet 2.5 mg  2.5 mg Oral Q6H PRN Foust, Katy L, NP   2.5 mg at 04/19/22 0411   phenytoin (DILANTIN) ER capsule 100 mg  100 mg Oral QHS Wieting, Richard, MD       polyethylene glycol (MIRALAX / GLYCOLAX) packet 17 g  17 g Oral Daily PRN Ivor Costa, MD       senna-docusate (Senokot-S) tablet 1 tablet  1 tablet Oral BID Ivor Costa, MD   1 tablet at 04/19/22 0941    OBJECTIVE: Vitals:   04/19/22 0418 04/19/22 0759  BP: (!) 164/69 132/88  Pulse:  85  Resp:  17  Temp:  98.5 F (36.9 C)  SpO2:  98%     Body mass index is 20.32 kg/m.      General: Well-developed, well-nourished, no acute distress. Eyes: Pink conjunctiva, anicteric sclera. HEENT: Normocephalic, moist mucous membranes, clear oropharnyx. Lungs: Clear to auscultation bilaterally. Heart: Regular rate and rhythm. No rubs, murmurs, or gallops. Abdomen: Soft, nontender, nondistended. No organomegaly noted, normoactive bowel sounds. Musculoskeletal: No edema, cyanosis, or clubbing. Neuro: Alert, answering all questions appropriately. Cranial nerves grossly intact. Skin: No rashes or petechiae noted. Psych: Normal affect. Lymphatics: No cervical, calvicular, axillary or inguinal  LAD.   LAB RESULTS:  Lab Results  Component Value Date   NA 135 04/19/2022   K 3.5 04/19/2022   CL 100 04/19/2022   CO2 26 04/19/2022   GLUCOSE 127 (H) 04/19/2022   BUN 20 04/19/2022   CREATININE 0.74 04/19/2022   CALCIUM 8.7 (L) 04/19/2022   PROT 6.4 (L) 04/19/2022   ALBUMIN 2.1 (L) 04/19/2022   AST 177 (H) 04/19/2022   ALT 207 (H) 04/19/2022   ALKPHOS 188 (H) 04/19/2022   BILITOT 0.6 04/19/2022   GFRNONAA >60 04/19/2022    Lab Results  Component Value Date   WBC 11.7 (H) 04/19/2022   NEUTROABS 7.1 04/14/2022   HGB  8.0 (L) 04/19/2022   HCT 24.9 (L) 04/19/2022   MCV 79.6 (L) 04/19/2022   PLT 414 (H) 04/19/2022    Lab Results  Component Value Date   FERRITIN 559 (H) 04/14/2022     STUDIES: ECHOCARDIOGRAM COMPLETE  Result Date: 04/18/2022    ECHOCARDIOGRAM REPORT   Patient Name:   DELCENIA INMAN Yarberry Date of Exam: 04/18/2022 Medical Rec #:  [id number]        Height:       64.0 in Accession #:    [id number]       Weight:       123.0 lb Date of Birth:  [birth year]         BSA:          1.591 m Patient Age:    23 years         BP:           154/70 mmHg Patient Gender: F                HR:           83 bpm. Exam Location:  [facility] Procedure: 2D Echo, Cardiac Doppler and Color Doppler Indications:     CHF-acute diastolic S01.09  History:         Patient has no prior history of Echocardiogram examinations.                  Risk Factors:Hypertension and Dyslipidemia.  Sonographer:     Sherrie Sport Referring Phys:  [id number] Soledad Gerlach NIU Diagnosing Phys: Serafina Royals MD  Sonographer Comments: Image quality was good. IMPRESSIONS  1. Left ventricular ejection fraction, by estimation, is 60 to 65%. The left ventricle has normal function. The left ventricle has no regional wall motion abnormalities. Left ventricular diastolic parameters were normal.  2. Right ventricular systolic function is normal. The right ventricular size is normal.  3. The mitral valve is normal in structure. Trivial mitral valve  regurgitation.  4. The aortic valve is normal in structure. Aortic valve regurgitation is not visualized. FINDINGS  Left Ventricle: Left ventricular ejection fraction, by estimation, is 60 to 65%. The left ventricle has normal function. The left ventricle has no regional wall motion abnormalities. The left ventricular internal cavity size was normal in size. There is  no left ventricular hypertrophy. Left ventricular diastolic parameters were normal. Right Ventricle: The right ventricular size is normal. No increase in right ventricular wall thickness. Right ventricular systolic function is normal. Left Atrium: Left atrial size was normal in size. Right Atrium: Right atrial size was normal in size. Pericardium: There is no evidence of pericardial effusion. Mitral Valve: The mitral valve is normal in structure. Trivial mitral valve regurgitation. Tricuspid Valve: The tricuspid valve is normal in structure. Tricuspid valve regurgitation is trivial. Aortic Valve: The aortic valve is normal in structure. Aortic valve regurgitation is not visualized. Aortic valve mean gradient measures 5.0 mmHg. Aortic valve peak gradient measures 9.5 mmHg. Aortic valve area, by VTI measures 2.83 cm. Pulmonic Valve: The pulmonic valve was normal in structure. Pulmonic valve regurgitation is trivial. Aorta: The aortic root and ascending aorta are structurally normal, with no evidence of dilitation. IAS/Shunts: No atrial level shunt detected by color flow Doppler.  LEFT VENTRICLE PLAX 2D LVIDd:         4.10 cm   Diastology LVIDs:         3.00 cm   LV e' medial:    4.68 cm/s LV PW:  1.10 cm   LV E/e' medial:  19.8 LV IVS:        1.00 cm   LV e' lateral:   7.40 cm/s LVOT diam:     2.10 cm   LV E/e' lateral: 12.5 LV SV:         77 LV SV Index:   48 LVOT Area:     3.46 cm  RIGHT VENTRICLE RV Basal diam:  2.60 cm RV Mid diam:    2.00 cm RV S prime:     13.90 cm/s TAPSE (M-mode): 2.5 cm LEFT ATRIUM             Index        RIGHT ATRIUM            Index LA diam:        3.20 cm 2.01 cm/m   RA Area:     14.50 cm LA Vol (A2C):   43.7 ml 27.46 ml/m  RA Volume:   32.70 ml  20.55 ml/m LA Vol (A4C):   27.1 ml 17.03 ml/m LA Biplane Vol: 37.0 ml 23.25 ml/m  AORTIC VALVE AV Area (Vmax):    2.52 cm AV Area (Vmean):   2.43 cm AV Area (VTI):     2.83 cm AV Vmax:           154.00 cm/s AV Vmean:          102.000 cm/s AV VTI:            0.272 m AV Peak Grad:      9.5 mmHg AV Mean Grad:      5.0 mmHg LVOT Vmax:         112.00 cm/s LVOT Vmean:        71.500 cm/s LVOT VTI:          0.222 m LVOT/AV VTI ratio: 0.82  AORTA Ao Root diam: 3.30 cm MITRAL VALVE                TRICUSPID VALVE MV Area (PHT): 3.77 cm     TR Peak grad:   14.3 mmHg MV Decel Time: 201 msec     TR Vmax:        189.00 cm/s MV E velocity: 92.80 cm/s MV A velocity: 129.00 cm/s  SHUNTS MV E/A ratio:  0.72         Systemic VTI:  0.22 m                             Systemic Diam: 2.10 cm Serafina Royals MD Electronically signed by Serafina Royals MD Signature Date/Time: 04/18/2022/12:49:06 PM    Final    CT ABDOMEN PELVIS W CONTRAST  Result Date: 04/17/2022 CLINICAL DATA:  Recently diagnosed pancreatic cancer. Recent sphincterotomy and biliary stent placement. Progressive weakness. * Tracking Code: BO * EXAM: CT ABDOMEN AND PELVIS WITH CONTRAST TECHNIQUE: Multidetector CT imaging of the abdomen and pelvis was performed using the standard protocol following bolus administration of intravenous contrast. RADIATION DOSE REDUCTION: This exam was performed according to the departmental dose-optimization program which includes automated exposure control, adjustment of the mA and/or kV according to patient size and/or use of iterative reconstruction technique. CONTRAST:  143m OMNIPAQUE IOHEXOL 300 MG/ML  SOLN COMPARISON:  Abdominopelvic CT 04/05/2022. Abdominal MRI 03/03/2022. FINDINGS: Lower chest: Patchy atelectasis at both lung bases. No pleural effusion or suspicious pulmonary nodule. There is  atherosclerosis of the aorta and coronary arteries. Hepatobiliary:  Metallic biliary stent remains in place with persistent pneumobilia. The gallbladder is decompressed, without wall thickening or stones. Unchanged appearance of the liver without suspicious abnormality. Pancreas: Known hypodense mass involving the pancreatic head is similar to previous study, measuring approximately 2.1 x 1.3 cm on image 29/3. Stable pancreatic ductal dilatation and mild surrounding inflammation. Spleen: Normal in size without focal abnormality. Adrenals/Urinary Tract: Unchanged 2.2 cm left adrenal nodule which was previously characterized by noncontrast CT as an adenoma. No follow-up imaging recommended. The right adrenal gland appears normal. No evidence of urinary tract calculus, suspicious renal lesion or hydronephrosis. Unchanged bilateral renal cysts; no follow-up imaging recommended. The bladder appears unremarkable for its degree of distention. Stomach/Bowel: No enteric contrast administered. The stomach appears unremarkable for its degree of distension. No evidence of bowel wall thickening, distention or surrounding inflammatory change. Vascular/Lymphatic: There are no enlarged abdominal or pelvic lymph nodes. Diffuse aortic and branch vessel atherosclerosis without evidence of aneurysm or large vessel occlusion. No gross vascular encasement identified in the porta hepatis. Reproductive: Status post hysterectomy.  No adnexal mass. Other: No ascites, free air or peritoneal nodularity. Previously noted small subcutaneous nodule within the left anterior abdominal wall has slightly decreased in size and may be related to previous subcutaneous injection. Musculoskeletal: No acute or significant osseous findings. Facet hypertrophy in the lower lumbar spine with a degenerative grade 1 anterolisthesis at L4-5. IMPRESSION: 1. No acute findings or clear explanation for the patient's symptoms. 2. Stable appearance of the known  pancreatic head mass with associated pancreatic ductal dilatation and pneumobilia following biliary stenting. No evidence of metastatic disease. 3. Aortic Atherosclerosis (ICD10-I70.0). Electronically Signed   By: Richardean Sale M.D.   On: 04/17/2022 16:43   US Venous Img Lower Unilateral Right  Result Date: 04/17/2022 CLINICAL DATA:  Swelling EXAM: Right LOWER EXTREMITY VENOUS DOPPLER ULTRASOUND TECHNIQUE: Gray-scale sonography with compression, as well as color and duplex ultrasound, were performed to evaluate the deep venous system(s) from the level of the common femoral vein through the popliteal and proximal calf veins. COMPARISON:  None Available. FINDINGS: VENOUS Normal compressibility of the common femoral, superficial femoral, and popliteal veins, as well as the visualized calf veins. Visualized portions of profunda femoral vein and great saphenous vein unremarkable. No filling defects to suggest DVT on grayscale or color Doppler imaging. Doppler waveforms show normal direction of venous flow, normal respiratory plasticity and response to augmentation. OTHER In the popliteal fossa there is a 4.5 x 1.4 x 2.8 cm heterogenous oval fluid collection. There is no flow within or around this collection. This is incompletely assessed on the provided images. Limitations: none IMPRESSION: 1. No evidence of right lower extremity DVT. 2. In the popliteal fossa there is a 4.5 x 1.4 x 2.8 cm heterogenous oval fluid collection, which could represent Baker's cyst with hemorrhage or synovitis. Electronically Signed   By: Marin Roberts M.D.   On: 04/17/2022 10:26   DG Chest Portable 1 View  Result Date: 04/17/2022 CLINICAL DATA:  Weakness and fatigue in a female at age 41. EXAM: PORTABLE CHEST 1 VIEW COMPARISON:  March 14, 2022. FINDINGS: EKG leads project over the chest. Cardiomediastinal contours and hilar structures with persistent cardiac enlargement. Increased interstitial markings improved compared to most  recent comparison imaging. No sign of lobar consolidative process. No pneumothorax. On limited assessment no acute skeletal findings. IMPRESSION: Cardiomegaly with improved interstitial markings compared to most recent comparison imaging. No sign of lobar consolidative process. Difficult to exclude mild interstitial edema,  difficult to differentiate from background chronic changes. Electronically Signed   By: Zetta Bills M.D.   On: 04/17/2022 09:34   CT ABD PELVIS W/WO CM ONCOLOGY PANCREATIC PROTOCOL  Result Date: 04/07/2022 CLINICAL DATA:  Pancreatic head adenocarcinoma diagnosed on 03/05/2022 ERCP. Staging. * Tracking Code: BO * EXAM: CT ABDOMEN AND PELVIS WITHOUT AND WITH CONTRAST TECHNIQUE: Multidetector CT imaging of the abdomen and pelvis was performed following the standard protocol before and following the bolus administration of intravenous contrast. RADIATION DOSE REDUCTION: This exam was performed according to the departmental dose-optimization program which includes automated exposure control, adjustment of the mA and/or kV according to patient size and/or use of iterative reconstruction technique. CONTRAST:  45m OMNIPAQUE IOHEXOL 300 MG/ML  SOLN COMPARISON:  03/03/2022 MRI abdomen and unenhanced CT abdomen/pelvis FINDINGS: Lower chest: No significant pulmonary nodules or acute consolidative airspace disease. Coronary atherosclerosis. Hepatobiliary: Normal liver size. Subcapsular hypodense 0.8 cm far inferior right liver lesion (series 9/image 57), too small to characterize, stable from 03/03/2022 MRI abdomen study, where it was T2 hyperintense and compatible with a tiny benign cyst. No additional liver lesions. Metallic CBD stent in place extending from the mid CBD to the descending duodenal lumen. Pneumobilia throughout the intrahepatic bile ducts and nondistended gallbladder indicating stent patency. Proximal CBD diameter 7 mm. Mild central intrahepatic biliary ductal dilatation. Pancreas:  Poorly defined hypoenhancing 2.9 x 2.4 cm pancreatic head mass (series 9/image 47), previously 2.9 x 2.3 cm on 03/03/2022 MRI using similar measurement technique, not appreciably changed. No clear tumor involvement of the SMV, portal veins or SMA. Dilated main pancreatic duct (6 mm diameter) with abrupt caliber transition at the level of the pancreatic head. Diffuse haziness of the peripancreatic fat without measurable peripancreatic fluid collections. No evidence of pancreatic parenchymal necrosis. Tiny 1.2 x 0.6 cm pancreatic body pseudocyst (series 9/image 44), slightly increased from 0.8 x 0.4 cm on 03/03/2022 MRI. Spleen: Normal size. No mass. Adrenals/Urinary Tract: Left adrenal 2.2 x 1.8 cm nodule with precontrast density 4 HU, compatible with an adenoma. Mildly irregular right adrenal gland is unchanged, without discrete right adrenal nodules. No hydronephrosis. Small simple bilateral renal cysts, largest 1.4 cm in the anterior upper right kidney. No suspicious renal masses. Normal bladder. Stomach/Bowel: Small hiatal hernia. Otherwise normal nondistended stomach. Normal caliber small bowel with no small bowel wall thickening. Appendix not discretely visualized. Normal large bowel with no diverticulosis, large bowel wall thickening or pericolonic fat stranding. Vascular/Lymphatic: Atherosclerotic nonaneurysmal abdominal aorta. Patent portal, splenic, hepatic and renal veins. No pathologically enlarged lymph nodes in the abdomen or pelvis. Reproductive: Status post hysterectomy, with no abnormal findings at the vaginal cuff. No adnexal mass. Other: No pneumoperitoneum, ascites or focal fluid collection. Nonspecific subcutaneous 0.8 cm ventral left abdominal wall soft tissue density nodule (series 9/image 85), new since 03/03/2022 CT. Musculoskeletal: No aggressive appearing focal osseous lesions. Mild thoracolumbar spondylosis. IMPRESSION: 1. Poorly defined hypoenhancing 2.9 cm pancreatic head mass, not  appreciably changed since 03/03/2022 MRI, compatible with known primary pancreatic adenocarcinoma. No evidence of vascular invasion by the pancreatic tumor. 2. Dilated main pancreatic duct with abrupt caliber transition at the level of the pancreatic head. Well-positioned CBD stent with pneumobilia indicating stent patency. 3. No lymphadenopathy or other findings highly suspicious for metastatic disease in the abdomen or pelvis. Nonspecific subcutaneous 0.8 cm ventral left abdominal wall soft tissue density nodule, new since 03/03/2022 CT, most commonly due to subcutaneous injections. 4. Evidence of ongoing pancreatitis. Tiny 1.2 x 0.6 cm pancreatic body  pseudocyst, slightly increased. 5. 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 6. Small hiatal hernia. 7. Coronary atherosclerosis. 8.  Aortic Atherosclerosis (ICD10-I70.0). Electronically Signed   By: Ilona Sorrel M.D.   On: 04/07/2022 16:08    ASSESSMENT AND PLAN:   Shirley Nichols is a 81 y.o. female with pmh with recent diagnosis of localized pancreatic cancer, prolonged hospitalization in October 2023 biliary obstruction status post sphincterotomy and metal stent complicated by metabolic encephalopathy requiring intubation presented to ED on 04/17/2022 for worsening generalized weakness.   CT abdomen pelvis showed stable pancreatic mass with pancreatic ductal dilatation and metallic stent in place.  Has transaminitis could be from cancer or drug.  Patient continues to have low energy.  She was able to sit for about an hour in the chair today.  Hold off on systemic treatment at this time.  She will follow-up with me in the clinic next week.  I will repeat labs next week.  Patient expressed understanding and was in agreement with this plan. She also understands that She can call clinic at any time with any questions, concerns, or complaints.   I spent a total of 35 minutes reviewing chart data, face-to-face evaluation with the  patient, counseling and coordination of care as detailed above.  Jane Canary, MD   04/19/2022 12:41 PM

## 2022-04-19 NOTE — Plan of Care (Signed)

## 2022-04-19 NOTE — Progress Notes (Signed)
PROGRESS NOTE    Shirley Nichols  TKZ:[id number] DOB: [birth year] DOA: 04/17/2022 PCP: Remi Haggard, FNP    Assessment & Plan:   Principal Problem:   General weakness Active Problems:   Baker's cyst of knee, right   Anemia of chronic disease   Pancreatic cancer ([place])   Hypokalemia   Hypomagnesemia   Hypertension   Hyperlipidemia   Seizure (HCC)   Elevated LFTs   Microcytic anemia   Leukocytosis   Palliative care encounter   COPD (chronic obstructive pulmonary disease) (HCC)   Malnutrition of moderate degree   Generalized weakness   Protein-calorie malnutrition, severe  Assessment and Plan: General weakness: PT/OT recs SNF    Baker's cyst of right knee: continue w/ supportive care   Anemia of chronic disease: H&H are labile. Will transfuse if Hb < 7.0    Pancreatic adenocarcinoma: s/p biliary stent placement. Management per onco    Hypomagnesemia: WNL today    Hypokalemia: WNL today   HLD: continue on zetia    Seizure: continue on home dose of keppra, dilantin    Transaminitis: likely secondary to pancreatic cancer. Will continue to monitor    Severe protein calorie malnutrition: continue on nutritional supplements   Thrombocytosis: etiology unclear. Will continue to monitor     DVT prophylaxis:  SCDs Code Status: DNR Family Communication: discussed pt's care w/ pt's family at bedside and answered their questions  Disposition Plan: possibly d/c to SNF   Level of care: Med-Surg  Status is: Inpatient Remains inpatient appropriate because: severity of illness   Consultants:  Onco   Procedures:   Antimicrobials:    Subjective: Pt c/o malaise   Objective: Vitals:   04/18/22 1941 04/19/22 0055 04/19/22 0415 04/19/22 0418  BP: (!) 165/74 (!) 182/78 (!) 175/67 (!) 164/69  Pulse: 82 81 85   Resp: '18 18 18   '$ Temp: 99.1 F (37.3 C) 98.2 F (36.8 C) 98.3 F (36.8 C)   TempSrc: Oral  Oral   SpO2: 98% 95% 97%   Weight:    53.7 kg  Height:         Intake/Output Summary (Last 24 hours) at 04/19/2022 0731 Last data filed at 04/19/2022 0400 Gross per 24 hour  Intake --  Output 300 ml  Net -300 ml   Filed Weights   04/17/22 0856 04/19/22 0418  Weight: 55.8 kg 53.7 kg    Examination:  General exam: Appears calm and comfortable  Respiratory system: Clear to auscultation. Respiratory effort normal. Cardiovascular system: S1 & S2+. No rubs, gallops or clicks.  Gastrointestinal system: Abdomen is nondistended, soft and nontender. Normal bowel sounds heard. Central nervous system: Alert and oriented. Moves all extremities Psychiatry: Judgement and insight appear normal. Flat mood and affect.     Data Reviewed: I have personally reviewed following labs and imaging studies  CBC: Recent Labs  Lab 04/14/22 1323 04/17/22 0913 04/18/22 0502 04/19/22 0335  WBC 10.5 15.2* 12.9* 11.7*  NEUTROABS 7.1  --   --   --   HGB 7.9* 8.3* 7.5* 8.0*  HCT 24.6* 25.6* 22.6* 24.9*  MCV 81.5 79.8* 79.6* 79.6*  PLT 327 364 365 573*   Basic Metabolic Panel: Recent Labs  Lab 04/14/22 1323 04/17/22 0913 04/18/22 0502 04/19/22 0335  NA 132* 136 136 135  K 3.7 3.1* 3.4* 3.5  CL 94* 99 101 100  CO2 '27 27 28 26  '$ GLUCOSE 132* 141* 115* 127*  BUN '23 18 17 20  '$ CREATININE 0.84 0.87  0.80 0.74  CALCIUM 8.7* 8.5* 8.1* 8.7*  MG  --  1.7 1.7 1.7   GFR: Estimated Creatinine Clearance: 46 mL/min (by C-G formula based on SCr of 0.74 mg/dL). Liver Function Tests: Recent Labs  Lab 04/14/22 1323 04/17/22 0913 04/19/22 0335  AST 71* 100* 177*  ALT 84* 124* 207*  ALKPHOS 132* 163* 188*  BILITOT 0.4 0.7 0.6  PROT 6.9 6.9 6.4*  ALBUMIN 2.6* 2.4* 2.1*   No results for input(s): "LIPASE", "AMYLASE" in the last 168 hours. No results for input(s): "AMMONIA" in the last 168 hours. Coagulation Profile: Recent Labs  Lab 04/17/22 0913  INR 1.3*   Cardiac Enzymes: No results for input(s): "CKTOTAL", "CKMB", "CKMBINDEX", "TROPONINI" in the  last 168 hours. BNP (last 3 results) No results for input(s): "PROBNP" in the last 8760 hours. HbA1C: No results for input(s): "HGBA1C" in the last 72 hours. CBG: No results for input(s): "GLUCAP" in the last 168 hours. Lipid Profile: No results for input(s): "CHOL", "HDL", "LDLCALC", "TRIG", "CHOLHDL", "LDLDIRECT" in the last 72 hours. Thyroid Function Tests: No results for input(s): "TSH", "T4TOTAL", "FREET4", "T3FREE", "THYROIDAB" in the last 72 hours. Anemia Panel: No results for input(s): "VITAMINB12", "FOLATE", "FERRITIN", "TIBC", "IRON", "RETICCTPCT" in the last 72 hours. Sepsis Labs: No results for input(s): "PROCALCITON", "LATICACIDVEN" in the last 168 hours.  Recent Results (from the past 240 hour(s))  Resp Panel by RT-PCR (Flu A&B, Covid) Anterior Nasal Swab     Status: None   Collection Time: 04/17/22  9:13 AM   Specimen: Anterior Nasal Swab  Result Value Ref Range Status   SARS Coronavirus 2 by RT PCR NEGATIVE NEGATIVE Final    Comment: (NOTE) SARS-CoV-2 target nucleic acids are NOT DETECTED.  The SARS-CoV-2 RNA is generally detectable in upper respiratory specimens during the acute phase of infection. The lowest concentration of SARS-CoV-2 viral copies this assay can detect is 138 copies/mL. A negative result does not preclude SARS-Cov-2 infection and should not be used as the sole basis for treatment or other patient management decisions. A negative result may occur with  improper specimen collection/handling, submission of specimen other than nasopharyngeal swab, presence of viral mutation(s) within the areas targeted by this assay, and inadequate number of viral copies(<138 copies/mL). A negative result must be combined with clinical observations, patient history, and epidemiological information. The expected result is Negative.  Fact Sheet for Patients:  EntrepreneurPulse.com.au  Fact Sheet for Healthcare Providers:   IncredibleEmployment.be  This test is no t yet approved or cleared by the [place] FDA and  has been authorized for detection and/or diagnosis of SARS-CoV-2 by FDA under an Emergency Use Authorization (EUA). This EUA will remain  in effect (meaning this test can be used) for the duration of the COVID-19 declaration under Section 564(b)(1) of the Act, 21 U.S.C.section 360bbb-3(b)(1), unless the authorization is terminated  or revoked sooner.       Influenza A by PCR NEGATIVE NEGATIVE Final   Influenza B by PCR NEGATIVE NEGATIVE Final    Comment: (NOTE) The Xpert Xpress SARS-CoV-2/FLU/RSV plus assay is intended as an aid in the diagnosis of influenza from Nasopharyngeal swab specimens and should not be used as a sole basis for treatment. Nasal washings and aspirates are unacceptable for Xpert Xpress SARS-CoV-2/FLU/RSV testing.  Fact Sheet for Patients: EntrepreneurPulse.com.au  Fact Sheet for Healthcare Providers: IncredibleEmployment.be  This test is not yet approved or cleared by the [place] FDA and has been authorized for detection and/or diagnosis of SARS-CoV-2 by  FDA under an Emergency Use Authorization (EUA). This EUA will remain in effect (meaning this test can be used) for the duration of the COVID-19 declaration under Section 564(b)(1) of the Act, 21 U.S.C. section 360bbb-3(b)(1), unless the authorization is terminated or revoked.  Performed at [facility], [address], [place], [place] [address]   Urine Culture     Status: None   Collection Time: 04/17/22 11:45 AM   Specimen: Urine, Clean Catch  Result Value Ref Range Status   Specimen Description   Final    URINE, CLEAN CATCH Performed at [facility], [address], [place], [place] [address]    Special Requests   Final    NONE Performed at [facility], [address], [place], [place] [address]    Culture    Final    NO GROWTH Performed at [facility], [place] [address] [place], [place] [address]    Report Status 04/18/2022 FINAL  Final  Culture, blood (Routine X 2) w Reflex to ID Panel     Status: None (Preliminary result)   Collection Time: 04/17/22 12:46 PM   Specimen: BLOOD  Result Value Ref Range Status   Specimen Description BLOOD RIGHT FA  Final   Special Requests   Final    BOTTLES DRAWN AEROBIC AND ANAEROBIC Blood Culture adequate volume   Culture   Final    NO GROWTH < 24 HOURS Performed at [facility], [address] [place], [place] [address]    Report Status PENDING  Incomplete  Culture, blood (Routine X 2) w Reflex to ID Panel     Status: None (Preliminary result)   Collection Time: 04/17/22 12:54 PM   Specimen: BLOOD  Result Value Ref Range Status   Specimen Description BLOOD LEFT HAND  Final   Special Requests   Final    BOTTLES DRAWN AEROBIC AND ANAEROBIC Blood Culture adequate volume   Culture   Final    NO GROWTH < 24 HOURS Performed at [facility], [address] [place], [place] [address]    Report Status PENDING  Incomplete         Radiology Studies: ECHOCARDIOGRAM COMPLETE  Result Date: 04/18/2022    ECHOCARDIOGRAM REPORT   Patient Name:   Shirley Nichols Date of Exam: 04/18/2022 Medical Rec #:  [id number]        Height:       64.0 in Accession #:    [id number]       Weight:       123.0 lb Date of Birth:  1940-07-12         BSA:          1.591 m Patient Age:    64 years         BP:           154/70 mmHg Patient Gender: F                HR:           83 bpm. Exam Location:  [facility] Procedure: 2D Echo, Cardiac Doppler and Color Doppler Indications:     CHF-acute diastolic S01.09  History:         Patient has no prior history of Echocardiogram examinations.                  Risk Factors:Hypertension and Dyslipidemia.  Sonographer:     Sherrie Sport Referring Phys:  Unknown Foley NIU Diagnosing Phys:  Serafina Royals MD  Sonographer Comments:  Image quality was good. IMPRESSIONS  1. Left ventricular ejection fraction, by estimation, is 60 to 65%. The left ventricle has normal function. The left ventricle has no regional wall motion abnormalities. Left ventricular diastolic parameters were normal.  2. Right ventricular systolic function is normal. The right ventricular size is normal.  3. The mitral valve is normal in structure. Trivial mitral valve regurgitation.  4. The aortic valve is normal in structure. Aortic valve regurgitation is not visualized. FINDINGS  Left Ventricle: Left ventricular ejection fraction, by estimation, is 60 to 65%. The left ventricle has normal function. The left ventricle has no regional wall motion abnormalities. The left ventricular internal cavity size was normal in size. There is  no left ventricular hypertrophy. Left ventricular diastolic parameters were normal. Right Ventricle: The right ventricular size is normal. No increase in right ventricular wall thickness. Right ventricular systolic function is normal. Left Atrium: Left atrial size was normal in size. Right Atrium: Right atrial size was normal in size. Pericardium: There is no evidence of pericardial effusion. Mitral Valve: The mitral valve is normal in structure. Trivial mitral valve regurgitation. Tricuspid Valve: The tricuspid valve is normal in structure. Tricuspid valve regurgitation is trivial. Aortic Valve: The aortic valve is normal in structure. Aortic valve regurgitation is not visualized. Aortic valve mean gradient measures 5.0 mmHg. Aortic valve peak gradient measures 9.5 mmHg. Aortic valve area, by VTI measures 2.83 cm. Pulmonic Valve: The pulmonic valve was normal in structure. Pulmonic valve regurgitation is trivial. Aorta: The aortic root and ascending aorta are structurally normal, with no evidence of dilitation. IAS/Shunts: No atrial level shunt detected by color flow Doppler.  LEFT VENTRICLE PLAX 2D LVIDd:         4.10 cm   Diastology LVIDs:          3.00 cm   LV e' medial:    4.68 cm/s LV PW:         1.10 cm   LV E/e' medial:  19.8 LV IVS:        1.00 cm   LV e' lateral:   7.40 cm/s LVOT diam:     2.10 cm   LV E/e' lateral: 12.5 LV SV:         77 LV SV Index:   48 LVOT Area:     3.46 cm  RIGHT VENTRICLE RV Basal diam:  2.60 cm RV Mid diam:    2.00 cm RV S prime:     13.90 cm/s TAPSE (M-mode): 2.5 cm LEFT ATRIUM             Index        RIGHT ATRIUM           Index LA diam:        3.20 cm 2.01 cm/m   RA Area:     14.50 cm LA Vol (A2C):   43.7 ml 27.46 ml/m  RA Volume:   32.70 ml  20.55 ml/m LA Vol (A4C):   27.1 ml 17.03 ml/m LA Biplane Vol: 37.0 ml 23.25 ml/m  AORTIC VALVE AV Area (Vmax):    2.52 cm AV Area (Vmean):   2.43 cm AV Area (VTI):     2.83 cm AV Vmax:           154.00 cm/s AV Vmean:          102.000 cm/s AV VTI:            0.272 m  AV Peak Grad:      9.5 mmHg AV Mean Grad:      5.0 mmHg LVOT Vmax:         112.00 cm/s LVOT Vmean:        71.500 cm/s LVOT VTI:          0.222 m LVOT/AV VTI ratio: 0.82  AORTA Ao Root diam: 3.30 cm MITRAL VALVE                TRICUSPID VALVE MV Area (PHT): 3.77 cm     TR Peak grad:   14.3 mmHg MV Decel Time: 201 msec     TR Vmax:        189.00 cm/s MV E velocity: 92.80 cm/s MV A velocity: 129.00 cm/s  SHUNTS MV E/A ratio:  0.72         Systemic VTI:  0.22 m                             Systemic Diam: 2.10 cm Serafina Royals MD Electronically signed by Serafina Royals MD Signature Date/Time: 04/18/2022/12:49:06 PM    Final    CT ABDOMEN PELVIS W CONTRAST  Result Date: 04/17/2022 CLINICAL DATA:  Recently diagnosed pancreatic cancer. Recent sphincterotomy and biliary stent placement. Progressive weakness. * Tracking Code: BO * EXAM: CT ABDOMEN AND PELVIS WITH CONTRAST TECHNIQUE: Multidetector CT imaging of the abdomen and pelvis was performed using the standard protocol following bolus administration of intravenous contrast. RADIATION DOSE REDUCTION: This exam was performed according to the departmental  dose-optimization program which includes automated exposure control, adjustment of the mA and/or kV according to patient size and/or use of iterative reconstruction technique. CONTRAST:  154m OMNIPAQUE IOHEXOL 300 MG/ML  SOLN COMPARISON:  Abdominopelvic CT 04/05/2022. Abdominal MRI 03/03/2022. FINDINGS: Lower chest: Patchy atelectasis at both lung bases. No pleural effusion or suspicious pulmonary nodule. There is atherosclerosis of the aorta and coronary arteries. Hepatobiliary: Metallic biliary stent remains in place with persistent pneumobilia. The gallbladder is decompressed, without wall thickening or stones. Unchanged appearance of the liver without suspicious abnormality. Pancreas: Known hypodense mass involving the pancreatic head is similar to previous study, measuring approximately 2.1 x 1.3 cm on image 29/3. Stable pancreatic ductal dilatation and mild surrounding inflammation. Spleen: Normal in size without focal abnormality. Adrenals/Urinary Tract: Unchanged 2.2 cm left adrenal nodule which was previously characterized by noncontrast CT as an adenoma. No follow-up imaging recommended. The right adrenal gland appears normal. No evidence of urinary tract calculus, suspicious renal lesion or hydronephrosis. Unchanged bilateral renal cysts; no follow-up imaging recommended. The bladder appears unremarkable for its degree of distention. Stomach/Bowel: No enteric contrast administered. The stomach appears unremarkable for its degree of distension. No evidence of bowel wall thickening, distention or surrounding inflammatory change. Vascular/Lymphatic: There are no enlarged abdominal or pelvic lymph nodes. Diffuse aortic and branch vessel atherosclerosis without evidence of aneurysm or large vessel occlusion. No gross vascular encasement identified in the porta hepatis. Reproductive: Status post hysterectomy.  No adnexal mass. Other: No ascites, free air or peritoneal nodularity. Previously noted small  subcutaneous nodule within the left anterior abdominal wall has slightly decreased in size and may be related to previous subcutaneous injection. Musculoskeletal: No acute or significant osseous findings. Facet hypertrophy in the lower lumbar spine with a degenerative grade 1 anterolisthesis at L4-5. IMPRESSION: 1. No acute findings or clear explanation for the patient's symptoms. 2. Stable appearance of the known pancreatic head mass  with associated pancreatic ductal dilatation and pneumobilia following biliary stenting. No evidence of metastatic disease. 3. Aortic Atherosclerosis (ICD10-I70.0). Electronically Signed   By: Richardean Sale M.D.   On: 04/17/2022 16:43   US Venous Img Lower Unilateral Right  Result Date: 04/17/2022 CLINICAL DATA:  Swelling EXAM: Right LOWER EXTREMITY VENOUS DOPPLER ULTRASOUND TECHNIQUE: Gray-scale sonography with compression, as well as color and duplex ultrasound, were performed to evaluate the deep venous system(s) from the level of the common femoral vein through the popliteal and proximal calf veins. COMPARISON:  None Available. FINDINGS: VENOUS Normal compressibility of the common femoral, superficial femoral, and popliteal veins, as well as the visualized calf veins. Visualized portions of profunda femoral vein and great saphenous vein unremarkable. No filling defects to suggest DVT on grayscale or color Doppler imaging. Doppler waveforms show normal direction of venous flow, normal respiratory plasticity and response to augmentation. OTHER In the popliteal fossa there is a 4.5 x 1.4 x 2.8 cm heterogenous oval fluid collection. There is no flow within or around this collection. This is incompletely assessed on the provided images. Limitations: none IMPRESSION: 1. No evidence of right lower extremity DVT. 2. In the popliteal fossa there is a 4.5 x 1.4 x 2.8 cm heterogenous oval fluid collection, which could represent Baker's cyst with hemorrhage or synovitis. Electronically  Signed   By: Marin Roberts M.D.   On: 04/17/2022 10:26   DG Chest Portable 1 View  Result Date: 04/17/2022 CLINICAL DATA:  Weakness and fatigue in a female at age 54. EXAM: PORTABLE CHEST 1 VIEW COMPARISON:  March 14, 2022. FINDINGS: EKG leads project over the chest. Cardiomediastinal contours and hilar structures with persistent cardiac enlargement. Increased interstitial markings improved compared to most recent comparison imaging. No sign of lobar consolidative process. No pneumothorax. On limited assessment no acute skeletal findings. IMPRESSION: Cardiomegaly with improved interstitial markings compared to most recent comparison imaging. No sign of lobar consolidative process. Difficult to exclude mild interstitial edema, difficult to differentiate from background chronic changes. Electronically Signed   By: Zetta Bills M.D.   On: 04/17/2022 09:34        Scheduled Meds:  aspirin EC  81 mg Oral Daily   ezetimibe  10 mg Oral QPM   feeding supplement  237 mL Oral TID BM   levETIRAcetam  750 mg Oral BID   magnesium oxide  800 mg Oral BID   mometasone-formoterol  2 puff Inhalation BID   multivitamin with minerals  1 tablet Oral Daily   phenytoin  100 mg Oral QHS   senna-docusate  1 tablet Oral BID   Continuous Infusions:   LOS: 1 day    Time spent: 35 mins     Wyvonnia Dusky, MD [facility] Pager [phone]  If 7PM-7AM, please contact night-coverage [email or web address] 04/19/2022, 7:31 AM

## 2022-04-19 NOTE — Progress Notes (Signed)
Physical Therapy Treatment Patient Details Name: Shirley Nichols MRN: [id number] DOB: Mar 25, 1941 Today's Date: 04/19/2022   History of Present Illness Pt is an 81 y.o. female presenting to hospital 04/17/22 with generalized weakness.  Pt admitted with generalized weakness, leukocytosis, intersitital edema, malignant obstructive jaundice, htn, HLD, hypokalemia, seizure, elevated LFT's, and microcytic anemia.  PMH includes htn, HLD, COPD, skin CA, recently diagnosed pancreatic CA, s/p biliary stent placement d/t biliary obstruction, seizure, anemia.    PT Comments    Patient continues to complain of R knee pain with mobility and new R thumb pain which patient and spouse report possible arthritis. Agreeable to therapy session to promote OOB mobility. Required modA for bed mobility for R LE management and trunk elevation. Able to stand from low bed surface with minA and increased time. Transferred to recliner with patient able to take steps with modA and RW with assist for offweighting R LE as she had limited ability to bear weight through R hand this date. Instructed patient on LAQ, SLR, and hip abduction/adduction to promote LE strengthening and encouraged her and spouse to perform 2-3x/day. Encouraged OOB mobility to [facility] for voiding with nursing staff. Continue to recommend SNF for ongoing Physical Therapy.      Recommendations for follow up therapy are one component of a multi-disciplinary discharge planning process, led by the attending physician.  Recommendations may be updated based on patient status, additional functional criteria and insurance authorization.  Follow Up Recommendations  Skilled nursing-short term rehab (<3 hours/day) Can patient physically be transported by private vehicle: No   Assistance Recommended at Discharge Frequent or constant Supervision/Assistance  Patient can return home with the following A lot of help with walking and/or transfers;A lot of help with  bathing/dressing/bathroom;Assistance with cooking/housework;Assist for transportation;Help with stairs or ramp for entrance   Equipment Recommendations  Rolling Saprina Chuong (2 wheels);BSC/3in1;Wheelchair (measurements PT);Wheelchair cushion (measurements PT)    Recommendations for Other Services       Precautions / Restrictions Precautions Precautions: Fall Precaution Comments: Seizure precautions Restrictions Weight Bearing Restrictions: No     Mobility  Bed Mobility Overal bed mobility: Needs Assistance Bed Mobility: Supine to Sit     Supine to sit: Mod assist     General bed mobility comments: assist for R LE and trunk elevation to come into sitting. Difficulty scooting forward to plce feet on ground requiring assist    Transfers Overall transfer level: Needs assistance Equipment used: Rolling Vana Arif (2 wheels) Transfers: Sit to/from Stand, Bed to chair/wheelchair/BSC Sit to Stand: Min assist   Step pivot transfers: Mod assist       General transfer comment: increased time to come into standing. Assist to boost into standing. Cues for hand placement. Able to take steps towards recliner with modA for RW management and assist to offweight R LE. Limited ability to utilize R hand on RW due to significant pain. Patient and spouse report possible arthritis    Ambulation/Gait                   Stairs             Wheelchair Mobility    Modified Rankin (Stroke Patients Only)       Balance Overall balance assessment: Needs assistance Sitting-balance support: No upper extremity supported, Feet supported Sitting balance-Leahy Scale: Fair     Standing balance support: Bilateral upper extremity supported, During functional activity Standing balance-Leahy Scale: Poor  Cognition Arousal/Alertness: Awake/alert Behavior During Therapy: WFL for tasks assessed/performed Overall Cognitive Status: Within Functional Limits  for tasks assessed                                          Exercises General Exercises - Lower Extremity Long Arc Quad: Right, 5 reps, Seated Hip ABduction/ADduction: Right, 5 reps, Other (comment) (long sitting) Straight Leg Raises: Right, 5 reps, Other (comment) (long sitting)    General Comments        Pertinent Vitals/Pain Pain Assessment Pain Assessment: Faces Faces Pain Scale: Hurts even more Pain Location: posterior R knee, R thumb Pain Descriptors / Indicators: Grimacing, Guarding, Aching, Tender, Sore Pain Intervention(s): Monitored during session, Premedicated before session, Repositioned    Home Living                          Prior Function            PT Goals (current goals can now be found in the care plan section) Acute Rehab PT Goals Patient Stated Goal: to improve pain and mobility PT Goal Formulation: With patient/family Time For Goal Achievement: 05/02/22 Potential to Achieve Goals: Good Progress towards PT goals: Progressing toward goals    Frequency    Min 2X/week      PT Plan Current plan remains appropriate    Co-evaluation              AM-PAC PT "6 Clicks" Mobility   Outcome Measure  Help needed turning from your back to your side while in a flat bed without using bedrails?: None Help needed moving from lying on your back to sitting on the side of a flat bed without using bedrails?: A Little Help needed moving to and from a bed to a chair (including a wheelchair)?: A Little Help needed standing up from a chair using your arms (e.g., wheelchair or bedside chair)?: A Lot Help needed to walk in hospital room?: Total Help needed climbing 3-5 steps with a railing? : Total 6 Click Score: 14    End of Session Equipment Utilized During Treatment: Gait belt Activity Tolerance: Patient limited by pain Patient left: in chair;with call bell/phone within reach;with chair alarm set;with family/visitor  present Nurse Communication: Mobility status PT Visit Diagnosis: Other abnormalities of gait and mobility (R26.89);Muscle weakness (generalized) (M62.81);Pain Pain - Right/Left: Right Pain - part of body: Knee     Time: 9191-6606 PT Time Calculation (min) (ACUTE ONLY): 39 min  Charges:  $Therapeutic Exercise: 8-22 mins $Therapeutic Activity: 23-37 mins                     Breionna Punt A. Gilford Rile PT, DPT [facility] - Acute Rehabilitation Services    Evalyse Stroope A Chandan Fly 04/19/2022, 10:47 AM

## 2022-04-20 DIAGNOSIS — E43 Unspecified severe protein-calorie malnutrition: Secondary | ICD-10-CM | POA: Diagnosis not present

## 2022-04-20 DIAGNOSIS — R7401 Elevation of levels of liver transaminase levels: Secondary | ICD-10-CM

## 2022-04-20 DIAGNOSIS — R531 Weakness: Secondary | ICD-10-CM | POA: Diagnosis not present

## 2022-04-20 LAB — COMPREHENSIVE METABOLIC PANEL
ALT: 173 U/L — ABNORMAL HIGH (ref 0–44)
AST: 109 U/L — ABNORMAL HIGH (ref 15–41)
Albumin: 2 g/dL — ABNORMAL LOW (ref 3.5–5.0)
Alkaline Phosphatase: 186 U/L — ABNORMAL HIGH (ref 38–126)
Anion gap: 11 (ref 5–15)
BUN: 29 mg/dL — ABNORMAL HIGH (ref 8–23)
CO2: 25 mmol/L (ref 22–32)
Calcium: 8.8 mg/dL — ABNORMAL LOW (ref 8.9–10.3)
Chloride: 99 mmol/L (ref 98–111)
Creatinine, Ser: 0.84 mg/dL (ref 0.44–1.00)
GFR, Estimated: >60 mL/min (ref >60)
Glucose, Bld: 127 mg/dL — ABNORMAL HIGH (ref 70–99)
Potassium: 3.5 mmol/L (ref 3.5–5.1)
Sodium: 135 mmol/L (ref 135–145)
Total Bilirubin: 0.6 mg/dL (ref 0.3–1.2)
Total Protein: 6 g/dL — ABNORMAL LOW (ref 6.5–8.1)

## 2022-04-20 LAB — CBC
HCT: 24.6 % — ABNORMAL LOW (ref 36.0–46.0)
Hemoglobin: 8 g/dL — ABNORMAL LOW (ref 12.0–15.0)
MCH: 25.9 pg — ABNORMAL LOW (ref 26.0–34.0)
MCHC: 32.5 g/dL (ref 30.0–36.0)
MCV: 79.6 fL — ABNORMAL LOW (ref 80.0–100.0)
Platelets: 443 10*3/uL — ABNORMAL HIGH (ref 150–400)
RBC: 3.09 MIL/uL — ABNORMAL LOW (ref 3.87–5.11)
RDW: 15.2 % (ref 11.5–15.5)
WBC: 11.3 10*3/uL — ABNORMAL HIGH (ref 4.0–10.5)
nRBC: 0 % (ref 0.0–0.2)

## 2022-04-20 NOTE — Plan of Care (Signed)

## 2022-04-20 NOTE — Progress Notes (Signed)
PROGRESS NOTE    Shirley Nichols  OEU:[id number] DOB: [birth year] DOA: 04/17/2022 PCP: Remi Haggard, FNP    Assessment & Plan:   Principal Problem:   General weakness Active Problems:   Baker's cyst of knee, right   Anemia of chronic disease   Pancreatic cancer ([place])   Hypokalemia   Hypomagnesemia   Hypertension   Hyperlipidemia   Seizure (HCC)   Elevated LFTs   Microcytic anemia   Leukocytosis   Palliative care encounter   COPD (chronic obstructive pulmonary disease) (HCC)   Malnutrition of moderate degree   Generalized weakness   Protein-calorie malnutrition, severe  Assessment and Plan: General weakness: PT/OT recs SNF. Pt and pt's family refuse SNF but agreeable to home health    Baker's cyst of right knee: continue w/ supportive care    Anemia of chronic disease: H&H are labile. Will transfuse if Hb < 7.0    Pancreatic adenocarcinoma: s/p biliary stent placement. Management per onco    Hypomagnesemia: WNL today    Hypokalemia: WNL    HLD:  continue on zetia    Seizure: continue on home dose of dilantin, keppra   Transaminitis: likely secondary to pancreatic cancer. Still elevated but trending down.    Severe protein calorie malnutrition:continue w/ nutritional supplements   Thrombocytosis: etiology unclear. Will continue to monitor     DVT prophylaxis:  SCDs Code Status: DNR Family Communication: discussed pt's care w/ pt's family at bedside and answered their questions  Disposition Plan: likely d/c home w/ HH   Level of care: Telemetry Medical  Status is: Inpatient Remains inpatient appropriate because: severity of illness   Consultants:  Onco   Procedures:   Antimicrobials:    Subjective: Pt c/o fatigue  Objective: Vitals:   04/19/22 1649 04/19/22 2026 04/20/22 0032 04/20/22 0514  BP: (!) 118/45 108/83 (!) 148/67 (!) 142/65  Pulse: 81 88 87 83  Resp: '18 15 16 16  '$ Temp: 98.2 F (36.8 C) 98.2 F (36.8 C) 98 F (36.7 C)  98.6 F (37 C)  TempSrc: Oral     SpO2: 94% 98% 97% 95%  Weight:      Height:        Intake/Output Summary (Last 24 hours) at 04/20/2022 0727 Last data filed at 04/19/2022 1800 Gross per 24 hour  Intake 120 ml  Output --  Net 120 ml   Filed Weights   04/17/22 0856 04/19/22 0418  Weight: 55.8 kg 53.7 kg    Examination:  General exam: Appears comfortable  Respiratory system: clear breath sounds b/l  Cardiovascular system: S1/S2+. No rubs or clicks   Gastrointestinal system: Abd is soft, NT, ND & normal bowel sounds  Central nervous system: alert and oriented. Moves all extremities  Psychiatry: judgement and insight appears at baseline. Flat mood and affect     Data Reviewed: I have personally reviewed following labs and imaging studies  CBC: Recent Labs  Lab 04/14/22 1323 04/17/22 0913 04/18/22 0502 04/19/22 0335 04/20/22 0402  WBC 10.5 15.2* 12.9* 11.7* 11.3*  NEUTROABS 7.1  --   --   --   --   HGB 7.9* 8.3* 7.5* 8.0* 8.0*  HCT 24.6* 25.6* 22.6* 24.9* 24.6*  MCV 81.5 79.8* 79.6* 79.6* 79.6*  PLT 327 364 365 414* 154*   Basic Metabolic Panel: Recent Labs  Lab 04/14/22 1323 04/17/22 0913 04/18/22 0502 04/19/22 0335 04/20/22 0402  NA 132* 136 136 135 135  K 3.7 3.1* 3.4* 3.5 3.5  CL 94*  99 101 100 99  CO2 '27 27 28 26 25  '$ GLUCOSE 132* 141* 115* 127* 127*  BUN '23 18 17 20 '$ 29*  CREATININE 0.84 0.87 0.80 0.74 0.84  CALCIUM 8.7* 8.5* 8.1* 8.7* 8.8*  MG  --  1.7 1.7 1.7  --    GFR: Estimated Creatinine Clearance: 43.8 mL/min (by C-G formula based on SCr of 0.84 mg/dL). Liver Function Tests: Recent Labs  Lab 04/14/22 1323 04/17/22 0913 04/19/22 0335 04/20/22 0402  AST 71* 100* 177* 109*  ALT 84* 124* 207* 173*  ALKPHOS 132* 163* 188* 186*  BILITOT 0.4 0.7 0.6 0.6  PROT 6.9 6.9 6.4* 6.0*  ALBUMIN 2.6* 2.4* 2.1* 2.0*   No results for input(s): "LIPASE", "AMYLASE" in the last 168 hours. No results for input(s): "AMMONIA" in the last 168  hours. Coagulation Profile: Recent Labs  Lab 04/17/22 0913  INR 1.3*   Cardiac Enzymes: No results for input(s): "CKTOTAL", "CKMB", "CKMBINDEX", "TROPONINI" in the last 168 hours. BNP (last 3 results) No results for input(s): "PROBNP" in the last 8760 hours. HbA1C: No results for input(s): "HGBA1C" in the last 72 hours. CBG: No results for input(s): "GLUCAP" in the last 168 hours. Lipid Profile: No results for input(s): "CHOL", "HDL", "LDLCALC", "TRIG", "CHOLHDL", "LDLDIRECT" in the last 72 hours. Thyroid Function Tests: No results for input(s): "TSH", "T4TOTAL", "FREET4", "T3FREE", "THYROIDAB" in the last 72 hours. Anemia Panel: No results for input(s): "VITAMINB12", "FOLATE", "FERRITIN", "TIBC", "IRON", "RETICCTPCT" in the last 72 hours. Sepsis Labs: No results for input(s): "PROCALCITON", "LATICACIDVEN" in the last 168 hours.  Recent Results (from the past 240 hour(s))  Resp Panel by RT-PCR (Flu A&B, Covid) Anterior Nasal Swab     Status: None   Collection Time: 04/17/22  9:13 AM   Specimen: Anterior Nasal Swab  Result Value Ref Range Status   SARS Coronavirus 2 by RT PCR NEGATIVE NEGATIVE Final    Comment: (NOTE) SARS-CoV-2 target nucleic acids are NOT DETECTED.  The SARS-CoV-2 RNA is generally detectable in upper respiratory specimens during the acute phase of infection. The lowest concentration of SARS-CoV-2 viral copies this assay can detect is 138 copies/mL. A negative result does not preclude SARS-Cov-2 infection and should not be used as the sole basis for treatment or other patient management decisions. A negative result may occur with  improper specimen collection/handling, submission of specimen other than nasopharyngeal swab, presence of viral mutation(s) within the areas targeted by this assay, and inadequate number of viral copies(<138 copies/mL). A negative result must be combined with clinical observations, patient history, and  epidemiological information. The expected result is Negative.  Fact Sheet for Patients:  EntrepreneurPulse.com.au  Fact Sheet for Healthcare Providers:  IncredibleEmployment.be  This test is no t yet approved or cleared by the [place] FDA and  has been authorized for detection and/or diagnosis of SARS-CoV-2 by FDA under an Emergency Use Authorization (EUA). This EUA will remain  in effect (meaning this test can be used) for the duration of the COVID-19 declaration under Section 564(b)(1) of the Act, 21 U.S.C.section 360bbb-3(b)(1), unless the authorization is terminated  or revoked sooner.       Influenza A by PCR NEGATIVE NEGATIVE Final   Influenza B by PCR NEGATIVE NEGATIVE Final    Comment: (NOTE) The Xpert Xpress SARS-CoV-2/FLU/RSV plus assay is intended as an aid in the diagnosis of influenza from Nasopharyngeal swab specimens and should not be used as a sole basis for treatment. Nasal washings and aspirates are unacceptable for  Xpert Xpress SARS-CoV-2/FLU/RSV testing.  Fact Sheet for Patients: EntrepreneurPulse.com.au  Fact Sheet for Healthcare Providers: IncredibleEmployment.be  This test is not yet approved or cleared by the [place] FDA and has been authorized for detection and/or diagnosis of SARS-CoV-2 by FDA under an Emergency Use Authorization (EUA). This EUA will remain in effect (meaning this test can be used) for the duration of the COVID-19 declaration under Section 564(b)(1) of the Act, 21 U.S.C. section 360bbb-3(b)(1), unless the authorization is terminated or revoked.  Performed at [facility], [address], [place], [place] [address]   Urine Culture     Status: None   Collection Time: 04/17/22 11:45 AM   Specimen: Urine, Clean Catch  Result Value Ref Range Status   Specimen Description   Final    URINE, CLEAN CATCH Performed at [facility],  [address] [place], [place] [address]    Special Requests   Final    NONE Performed at [facility], [address], [place], [place] [address]    Culture   Final    NO GROWTH Performed at [facility], [place] [address] [place], [place] [address]    Report Status 04/18/2022 FINAL  Final  Culture, blood (Routine X 2) w Reflex to ID Panel     Status: None (Preliminary result)   Collection Time: 04/17/22 12:46 PM   Specimen: BLOOD  Result Value Ref Range Status   Specimen Description BLOOD RIGHT FA  Final   Special Requests   Final    BOTTLES DRAWN AEROBIC AND ANAEROBIC Blood Culture adequate volume   Culture   Final    NO GROWTH 2 DAYS Performed at [facility], [address], [place], [place] [address]    Report Status PENDING  Incomplete  Culture, blood (Routine X 2) w Reflex to ID Panel     Status: None (Preliminary result)   Collection Time: 04/17/22 12:54 PM   Specimen: BLOOD  Result Value Ref Range Status   Specimen Description BLOOD LEFT HAND  Final   Special Requests   Final    BOTTLES DRAWN AEROBIC AND ANAEROBIC Blood Culture adequate volume   Culture   Final    NO GROWTH 2 DAYS Performed at [facility], [address] [place],  [address]    Report Status PENDING  Incomplete         Radiology Studies: ECHOCARDIOGRAM COMPLETE  Result Date: 04/18/2022    ECHOCARDIOGRAM REPORT   Patient Name:   PAMELYN BANCROFT Strength Date of Exam: 04/18/2022 Medical Rec #:  [id number]        Height:       64.0 in Accession #:    [id number]       Weight:       123.0 lb Date of Birth:  Jun 06, 1940         BSA:          1.591 m Patient Age:    53 years         BP:           154/70 mmHg Patient Gender: F                HR:           83 bpm. Exam Location:  [facility] Procedure: 2D Echo, Cardiac Doppler and Color Doppler Indications:     CHF-acute diastolic R94.58  History:         Patient has no prior history  of Echocardiogram examinations.                   Risk Factors:Hypertension and Dyslipidemia.  Sonographer:     Sherrie Sport Referring Phys:  [id number] Soledad Gerlach NIU Diagnosing Phys: Serafina Royals MD  Sonographer Comments: Image quality was good. IMPRESSIONS  1. Left ventricular ejection fraction, by estimation, is 60 to 65%. The left ventricle has normal function. The left ventricle has no regional wall motion abnormalities. Left ventricular diastolic parameters were normal.  2. Right ventricular systolic function is normal. The right ventricular size is normal.  3. The mitral valve is normal in structure. Trivial mitral valve regurgitation.  4. The aortic valve is normal in structure. Aortic valve regurgitation is not visualized. FINDINGS  Left Ventricle: Left ventricular ejection fraction, by estimation, is 60 to 65%. The left ventricle has normal function. The left ventricle has no regional wall motion abnormalities. The left ventricular internal cavity size was normal in size. There is  no left ventricular hypertrophy. Left ventricular diastolic parameters were normal. Right Ventricle: The right ventricular size is normal. No increase in right ventricular wall thickness. Right ventricular systolic function is normal. Left Atrium: Left atrial size was normal in size. Right Atrium: Right atrial size was normal in size. Pericardium: There is no evidence of pericardial effusion. Mitral Valve: The mitral valve is normal in structure. Trivial mitral valve regurgitation. Tricuspid Valve: The tricuspid valve is normal in structure. Tricuspid valve regurgitation is trivial. Aortic Valve: The aortic valve is normal in structure. Aortic valve regurgitation is not visualized. Aortic valve mean gradient measures 5.0 mmHg. Aortic valve peak gradient measures 9.5 mmHg. Aortic valve area, by VTI measures 2.83 cm. Pulmonic Valve: The pulmonic valve was normal in structure. Pulmonic valve regurgitation is trivial. Aorta: The aortic root and ascending aorta are structurally normal,  with no evidence of dilitation. IAS/Shunts: No atrial level shunt detected by color flow Doppler.  LEFT VENTRICLE PLAX 2D LVIDd:         4.10 cm   Diastology LVIDs:         3.00 cm   LV e' medial:    4.68 cm/s LV PW:         1.10 cm   LV E/e' medial:  19.8 LV IVS:        1.00 cm   LV e' lateral:   7.40 cm/s LVOT diam:     2.10 cm   LV E/e' lateral: 12.5 LV SV:         77 LV SV Index:   48 LVOT Area:     3.46 cm  RIGHT VENTRICLE RV Basal diam:  2.60 cm RV Mid diam:    2.00 cm RV S prime:     13.90 cm/s TAPSE (M-mode): 2.5 cm LEFT ATRIUM             Index        RIGHT ATRIUM           Index LA diam:        3.20 cm 2.01 cm/m   RA Area:     14.50 cm LA Vol (A2C):   43.7 ml 27.46 ml/m  RA Volume:   32.70 ml  20.55 ml/m LA Vol (A4C):   27.1 ml 17.03 ml/m LA Biplane Vol: 37.0 ml 23.25 ml/m  AORTIC VALVE AV Area (Vmax):    2.52 cm AV Area (Vmean):   2.43 cm AV Area (VTI):     2.83 cm AV Vmax:  154.00 cm/s AV Vmean:          102.000 cm/s AV VTI:            0.272 m AV Peak Grad:      9.5 mmHg AV Mean Grad:      5.0 mmHg LVOT Vmax:         112.00 cm/s LVOT Vmean:        71.500 cm/s LVOT VTI:          0.222 m LVOT/AV VTI ratio: 0.82  AORTA Ao Root diam: 3.30 cm MITRAL VALVE                TRICUSPID VALVE MV Area (PHT): 3.77 cm     TR Peak grad:   14.3 mmHg MV Decel Time: 201 msec     TR Vmax:        189.00 cm/s MV E velocity: 92.80 cm/s MV A velocity: 129.00 cm/s  SHUNTS MV E/A ratio:  0.72         Systemic VTI:  0.22 m                             Systemic Diam: 2.10 cm Serafina Royals MD Electronically signed by Serafina Royals MD Signature Date/Time: 04/18/2022/12:49:06 PM    Final         Scheduled Meds:  aspirin EC  81 mg Oral Daily   docusate sodium  200 mg Oral BID   ezetimibe  10 mg Oral QPM   feeding supplement  237 mL Oral TID BM   levETIRAcetam  750 mg Oral BID   magnesium oxide  800 mg Oral BID   mometasone-formoterol  2 puff Inhalation BID   multivitamin with minerals  1 tablet Oral  Daily   phenytoin  100 mg Oral QHS   senna-docusate  1 tablet Oral BID   Continuous Infusions:   LOS: 2 days    Time spent: 30 mins     Wyvonnia Dusky, MD [facility] Pager [phone]  If 7PM-7AM, please contact night-coverage [email or web address] 04/20/2022, 7:27 AM

## 2022-04-21 DIAGNOSIS — E43 Unspecified severe protein-calorie malnutrition: Secondary | ICD-10-CM | POA: Diagnosis not present

## 2022-04-21 DIAGNOSIS — R531 Weakness: Secondary | ICD-10-CM | POA: Diagnosis not present

## 2022-04-21 DIAGNOSIS — R7401 Elevation of levels of liver transaminase levels: Secondary | ICD-10-CM | POA: Diagnosis not present

## 2022-04-21 LAB — CBC
HCT: 23.5 % — ABNORMAL LOW (ref 36.0–46.0)
Hemoglobin: 7.5 g/dL — ABNORMAL LOW (ref 12.0–15.0)
MCH: 25.7 pg — ABNORMAL LOW (ref 26.0–34.0)
MCHC: 31.9 g/dL (ref 30.0–36.0)
MCV: 80.5 fL (ref 80.0–100.0)
Platelets: 451 10*3/uL — ABNORMAL HIGH (ref 150–400)
RBC: 2.92 MIL/uL — ABNORMAL LOW (ref 3.87–5.11)
RDW: 15.2 % (ref 11.5–15.5)
WBC: 9.1 10*3/uL (ref 4.0–10.5)
nRBC: 0 % (ref 0.0–0.2)

## 2022-04-21 LAB — COMPREHENSIVE METABOLIC PANEL
ALT: 136 U/L — ABNORMAL HIGH (ref 0–44)
AST: 71 U/L — ABNORMAL HIGH (ref 15–41)
Albumin: 2.1 g/dL — ABNORMAL LOW (ref 3.5–5.0)
Alkaline Phosphatase: 160 U/L — ABNORMAL HIGH (ref 38–126)
Anion gap: 9 (ref 5–15)
BUN: 31 mg/dL — ABNORMAL HIGH (ref 8–23)
CO2: 29 mmol/L (ref 22–32)
Calcium: 8.9 mg/dL (ref 8.9–10.3)
Chloride: 101 mmol/L (ref 98–111)
Creatinine, Ser: 0.71 mg/dL (ref 0.44–1.00)
GFR, Estimated: >60 mL/min (ref >60)
Glucose, Bld: 124 mg/dL — ABNORMAL HIGH (ref 70–99)
Potassium: 3.3 mmol/L — ABNORMAL LOW (ref 3.5–5.1)
Sodium: 139 mmol/L (ref 135–145)
Total Bilirubin: 0.6 mg/dL (ref 0.3–1.2)
Total Protein: 6.2 g/dL — ABNORMAL LOW (ref 6.5–8.1)

## 2022-04-21 MED ORDER — POTASSIUM CHLORIDE CRYS ER 20 MEQ PO TBCR
40.0000 meq | EXTENDED_RELEASE_TABLET | Freq: Once | ORAL | Status: AC
  Administered 2022-04-21: 40 meq via ORAL
  Filled 2022-04-21: qty 2

## 2022-04-21 NOTE — TOC Progression Note (Signed)
Transition of Care [facility]) - Progression Note    Patient Details  Name: Shirley Nichols MRN: [id number] Date of Birth: [birth year]  Transition of Care [facility]) CM/SW [place], [place] Phone Number: 04/21/2022, 12:33 PM  Clinical Narrative:     TOC spoke to [facility] from [place]. This patient will resume care with [facility] at discharge.       Expected Discharge Plan and Services                              [facility]                   Social Determinants of Health (SDOH) Interventions    Readmission Risk Interventions    03/23/2022    2:34 PM 03/10/2022    1:01 PM  Readmission Risk Prevention Plan  Transportation Screening  Complete  PCP or Specialist Appt within 5-7 Days  Complete  Home Care Screening  Complete  Medication Review (RN CM)  Complete  HRI or Home Care Consult Complete   Social Work Consult for [place] Planning/Counseling Complete   Palliative Care Screening Complete   Medication Review Press photographer) Complete

## 2022-04-21 NOTE — Progress Notes (Signed)
PROGRESS NOTE    Shirley Nichols  KDT:[id number] DOB: 1940-11-07 DOA: 04/17/2022 PCP: Remi Haggard, FNP    Assessment & Plan:   Principal Problem:   General weakness Active Problems:   Baker's cyst of knee, right   Anemia of chronic disease   Pancreatic cancer ([employer])   Hypokalemia   Hypomagnesemia   Hypertension   Hyperlipidemia   Seizure (HCC)   Elevated LFTs   Microcytic anemia   Leukocytosis   Palliative care encounter   COPD (chronic obstructive pulmonary disease) (HCC)   Malnutrition of moderate degree   Generalized weakness   Protein-calorie malnutrition, severe  Assessment and Plan: General weakness: PT/OT recs SNF. Pt and pt's family refuse SNF but agreeable to home health    Baker's cyst of right knee: continue w/ supportive care    Anemia of chronic disease: H&H are labile. Will transfuse if Hb < 7.0    Pancreatic adenocarcinoma: s/p biliary stent placement. Management as per onco    Hypomagnesemia: WNL today    Hypokalemia: potassium given    HLD: continue on zetia    Seizure: continue on home dose of keppra, dilantin   Transaminitis: likely secondary to pancreatic cancer. Trending down daily   Severe protein calorie malnutrition: continue w/ nutritional supplements    Thrombocytosis: etiology unclear. Will continue to monitor      DVT prophylaxis:  SCDs Code Status: DNR Family Communication: discussed pt's care w/ pt's husband, Mortimer Fries, and answered his questions  Disposition Plan: likely d/c home w/ HH   Level of care: Telemetry Medical  Status is: Inpatient Remains inpatient appropriate because: severity of illness, Home health will be set up today by CM    Consultants:  Onco   Procedures:   Antimicrobials:    Subjective: Pt c/o malaise   Objective: Vitals:   04/20/22 1542 04/20/22 2119 04/21/22 0101 04/21/22 0622  BP: 134/65 136/63 123/71 136/64  Pulse: 79 74 83 78  Resp: '17 18 17 17  '$ Temp: 98.2 F (36.8 C) 97.9 F  (36.6 C) 98.1 F (36.7 C) 98 F (36.7 C)  TempSrc:  Oral Oral Oral  SpO2: 96% 97% 97% 97%  Weight:      Height:        Intake/Output Summary (Last 24 hours) at 04/21/2022 0730 Last data filed at 04/20/2022 1015 Gross per 24 hour  Intake 120 ml  Output --  Net 120 ml   Filed Weights   04/17/22 0856 04/19/22 0418  Weight: 55.8 kg 53.7 kg    Examination:  General exam: Appears calm & comfortable  Respiratory system: clear breath sounds b/l  Cardiovascular system: S1 & S2+. No rubs or clicks  Gastrointestinal system: Abd is soft, NT, ND & hypoactive bowel sounds  Central nervous system: Alert and oriented. Moves all extremities  Psychiatry: judgement and insight appears at baseline. Flat mood and affect      Data Reviewed: I have personally reviewed following labs and imaging studies  CBC: Recent Labs  Lab 04/14/22 1323 04/17/22 0913 04/18/22 0502 04/19/22 0335 04/20/22 0402 04/21/22 0605  WBC 10.5 15.2* 12.9* 11.7* 11.3* 9.1  NEUTROABS 7.1  --   --   --   --   --   HGB 7.9* 8.3* 7.5* 8.0* 8.0* 7.5*  HCT 24.6* 25.6* 22.6* 24.9* 24.6* 23.5*  MCV 81.5 79.8* 79.6* 79.6* 79.6* 80.5  PLT 327 364 365 414* 443* 998*   Basic Metabolic Panel: Recent Labs  Lab 04/17/22 0913 04/18/22 0502 04/19/22  0335 04/20/22 0402 04/21/22 0605  NA 136 136 135 135 139  K 3.1* 3.4* 3.5 3.5 3.3*  CL 99 101 100 99 101  CO2 '27 28 26 25 29  '$ GLUCOSE 141* 115* 127* 127* 124*  BUN '18 17 20 '$ 29* 31*  CREATININE 0.87 0.80 0.74 0.84 0.71  CALCIUM 8.5* 8.1* 8.7* 8.8* 8.9  MG 1.7 1.7 1.7  --   --    GFR: Estimated Creatinine Clearance: 46 mL/min (by C-G formula based on SCr of 0.71 mg/dL). Liver Function Tests: Recent Labs  Lab 04/14/22 1323 04/17/22 0913 04/19/22 0335 04/20/22 0402 04/21/22 0605  AST 71* 100* 177* 109* 71*  ALT 84* 124* 207* 173* 136*  ALKPHOS 132* 163* 188* 186* 160*  BILITOT 0.4 0.7 0.6 0.6 0.6  PROT 6.9 6.9 6.4* 6.0* 6.2*  ALBUMIN 2.6* 2.4* 2.1* 2.0* 2.1*    No results for input(s): "LIPASE", "AMYLASE" in the last 168 hours. No results for input(s): "AMMONIA" in the last 168 hours. Coagulation Profile: Recent Labs  Lab 04/17/22 0913  INR 1.3*   Cardiac Enzymes: No results for input(s): "CKTOTAL", "CKMB", "CKMBINDEX", "TROPONINI" in the last 168 hours. BNP (last 3 results) No results for input(s): "PROBNP" in the last 8760 hours. HbA1C: No results for input(s): "HGBA1C" in the last 72 hours. CBG: No results for input(s): "GLUCAP" in the last 168 hours. Lipid Profile: No results for input(s): "CHOL", "HDL", "LDLCALC", "TRIG", "CHOLHDL", "LDLDIRECT" in the last 72 hours. Thyroid Function Tests: No results for input(s): "TSH", "T4TOTAL", "FREET4", "T3FREE", "THYROIDAB" in the last 72 hours. Anemia Panel: No results for input(s): "VITAMINB12", "FOLATE", "FERRITIN", "TIBC", "IRON", "RETICCTPCT" in the last 72 hours. Sepsis Labs: No results for input(s): "PROCALCITON", "LATICACIDVEN" in the last 168 hours.  Recent Results (from the past 240 hour(s))  Resp Panel by RT-PCR (Flu A&B, Covid) Anterior Nasal Swab     Status: None   Collection Time: 04/17/22  9:13 AM   Specimen: Anterior Nasal Swab  Result Value Ref Range Status   SARS Coronavirus 2 by RT PCR NEGATIVE NEGATIVE Final    Comment: (NOTE) SARS-CoV-2 target nucleic acids are NOT DETECTED.  The SARS-CoV-2 RNA is generally detectable in upper respiratory specimens during the acute phase of infection. The lowest concentration of SARS-CoV-2 viral copies this assay can detect is 138 copies/mL. A negative result does not preclude SARS-Cov-2 infection and should not be used as the sole basis for treatment or other patient management decisions. A negative result may occur with  improper specimen collection/handling, submission of specimen other than nasopharyngeal swab, presence of viral mutation(s) within the areas targeted by this assay, and inadequate number of viral copies(<138  copies/mL). A negative result must be combined with clinical observations, patient history, and epidemiological information. The expected result is Negative.  Fact Sheet for Patients:  EntrepreneurPulse.com.au  Fact Sheet for Healthcare Providers:  IncredibleEmployment.be  This test is no t yet approved or cleared by the [place] FDA and  has been authorized for detection and/or diagnosis of SARS-CoV-2 by FDA under an Emergency Use Authorization (EUA). This EUA will remain  in effect (meaning this test can be used) for the duration of the COVID-19 declaration under Section 564(b)(1) of the Act, 21 U.S.C.section 360bbb-3(b)(1), unless the authorization is terminated  or revoked sooner.       Influenza A by PCR NEGATIVE NEGATIVE Final   Influenza B by PCR NEGATIVE NEGATIVE Final    Comment: (NOTE) The Xpert Xpress SARS-CoV-2/FLU/RSV plus assay is intended  as an aid in the diagnosis of influenza from Nasopharyngeal swab specimens and should not be used as a sole basis for treatment. Nasal washings and aspirates are unacceptable for Xpert Xpress SARS-CoV-2/FLU/RSV testing.  Fact Sheet for Patients: EntrepreneurPulse.com.au  Fact Sheet for Healthcare Providers: IncredibleEmployment.be  This test is not yet approved or cleared by the [place] FDA and has been authorized for detection and/or diagnosis of SARS-CoV-2 by FDA under an Emergency Use Authorization (EUA). This EUA will remain in effect (meaning this test can be used) for the duration of the COVID-19 declaration under Section 564(b)(1) of the Act, 21 U.S.C. section 360bbb-3(b)(1), unless the authorization is terminated or revoked.  Performed at [facility], [address], [place], [place] [address]   Urine Culture     Status: None   Collection Time: 04/17/22 11:45 AM   Specimen: Urine, Clean Catch  Result Value Ref Range  Status   Specimen Description   Final    URINE, CLEAN CATCH Performed at [facility], [address] [place], [place] [address]    Special Requests   Final    NONE Performed at [facility], [address] [place], [place] [address]    Culture   Final    NO GROWTH Performed at [facility], [place] [address] [place], [place] [address]    Report Status 04/18/2022 FINAL  Final  Culture, blood (Routine X 2) w Reflex to ID Panel     Status: None (Preliminary result)   Collection Time: 04/17/22 12:46 PM   Specimen: BLOOD  Result Value Ref Range Status   Specimen Description BLOOD RIGHT FA  Final   Special Requests   Final    BOTTLES DRAWN AEROBIC AND ANAEROBIC Blood Culture adequate volume   Culture   Final    NO GROWTH 4 DAYS Performed at [facility], [address] [place], [place] [address]    Report Status PENDING  Incomplete  Culture, blood (Routine X 2) w Reflex to ID Panel     Status: None (Preliminary result)   Collection Time: 04/17/22 12:54 PM   Specimen: BLOOD  Result Value Ref Range Status   Specimen Description BLOOD LEFT HAND  Final   Special Requests   Final    BOTTLES DRAWN AEROBIC AND ANAEROBIC Blood Culture adequate volume   Culture   Final    NO GROWTH 4 DAYS Performed at [facility], [address], [place],  [address]    Report Status PENDING  Incomplete         Radiology Studies: No results found.      Scheduled Meds:  aspirin EC  81 mg Oral Daily   docusate sodium  200 mg Oral BID   ezetimibe  10 mg Oral QPM   feeding supplement  237 mL Oral TID BM   levETIRAcetam  750 mg Oral BID   magnesium oxide  800 mg Oral BID   mometasone-formoterol  2 puff Inhalation BID   multivitamin with minerals  1 tablet Oral Daily   phenytoin  100 mg Oral QHS   senna-docusate  1 tablet Oral BID   Continuous Infusions:   LOS: 3 days    Time spent: 33 mins     Wyvonnia Dusky, MD [facility] Pager [phone]  If 7PM-7AM, please contact night-coverage [email or web address] 04/21/2022, 7:30 AM

## 2022-04-21 NOTE — Care Management Important Message (Signed)
Important Message  Patient Details  Name: Shirley Nichols MRN: [id number] Date of Birth: [birth year]   Medicare Important Message Given:  Yes     Dannette Barbara 04/21/2022, 11:09 AM

## 2022-04-21 NOTE — Progress Notes (Signed)
Physical Therapy Treatment Patient Details Name: Shirley Nichols MRN: [id number] DOB: Mar 11, 1941 Today's Date: 04/21/2022   History of Present Illness Pt is an 81 y.o. female presenting to hospital 04/17/22 with generalized weakness.  Pt admitted with generalized weakness, leukocytosis, intersitital edema, malignant obstructive jaundice, htn, HLD, hypokalemia, seizure, elevated LFT's, and microcytic anemia.  PMH includes htn, HLD, COPD, skin CA, recently diagnosed pancreatic CA, s/p biliary stent placement d/t biliary obstruction, seizure, anemia.    PT Comments    Patient with improvement of R knee pain this date. Able to ambulate in room with RW and min guard-supervision. She was able to take some steps with no AD and min guard with no LOB. Performed sit to stand x 10 with supervision and good recall of hand placement. Minimal knee pain reported during session. Updated discharge recommendation to [facility].     Recommendations for follow up therapy are one component of a multi-disciplinary discharge planning process, led by the attending physician.  Recommendations may be updated based on patient status, additional functional criteria and insurance authorization.  Follow Up Recommendations  Home health PT     Assistance Recommended at Discharge Frequent or constant Supervision/Assistance  Patient can return home with the following A little help with walking and/or transfers;A little help with bathing/dressing/bathroom;Assistance with cooking/housework;Help with stairs or ramp for entrance;Assist for transportation   Equipment Recommendations  Rolling [place] (2 wheels)    Recommendations for Other Services       Precautions / Restrictions Precautions Precautions: Fall Precaution Comments: Seizure precautions Restrictions Weight Bearing Restrictions: No     Mobility  Bed Mobility               General bed mobility comments: in bathroom on arrival with RN present     Transfers Overall transfer level: Needs assistance Equipment used: Rolling Jaydy Fitzhenry (2 wheels) Transfers: Sit to/from Stand Sit to Stand: Supervision           General transfer comment: supervision for safety. No assistance required. Good recall of hand placement    Ambulation/Gait Ambulation/Gait assistance: Min guard, Supervision Gait Distance (Feet): 15 Feet (+25') Assistive device: Rolling Mathew Postiglione (2 wheels) Gait Pattern/deviations: Step-through pattern, Decreased stride length Gait velocity: decreased     General Gait Details: min guard initially but able to progress to supervision with RW. At times, patient able to take steps in room without AD. Minimal R knee pain reported. No LOB during mobility   Stairs             Wheelchair Mobility    Modified Rankin (Stroke Patients Only)       Balance Overall balance assessment: Needs assistance Sitting-balance support: No upper extremity supported, Feet supported Sitting balance-Leahy Scale: Fair     Standing balance support: Bilateral upper extremity supported, During functional activity Standing balance-Leahy Scale: Fair                              Cognition Arousal/Alertness: Awake/alert Behavior During Therapy: WFL for tasks assessed/performed Overall Cognitive Status: Within Functional Limits for tasks assessed                                          Exercises Other Exercises Other Exercises: Sit to stand x 10    General Comments        Pertinent Vitals/Pain Pain  Assessment Pain Assessment: Faces Faces Pain Scale: Hurts a little bit Pain Location: R knee Pain Descriptors / Indicators: Guarding, Discomfort Pain Intervention(s): Monitored during session    Home Living                          Prior Function            PT Goals (current goals can now be found in the care plan section) Acute Rehab PT Goals Patient Stated Goal: to go home  tomorrow PT Goal Formulation: With patient/family Time For Goal Achievement: 05/02/22 Potential to Achieve Goals: Good Progress towards PT goals: Progressing toward goals    Frequency    Min 2X/week      PT Plan Discharge plan needs to be updated    Co-evaluation              AM-PAC PT "6 Clicks" Mobility   Outcome Measure  Help needed turning from your back to your side while in a flat bed without using bedrails?: None Help needed moving from lying on your back to sitting on the side of a flat bed without using bedrails?: A Little Help needed moving to and from a bed to a chair (including a wheelchair)?: A Little Help needed standing up from a chair using your arms (e.g., wheelchair or bedside chair)?: A Little Help needed to walk in hospital room?: A Little Help needed climbing 3-5 steps with a railing? : A Lot 6 Click Score: 18    End of Session   Activity Tolerance: Patient tolerated treatment well Patient left: in chair;with call bell/phone within reach;with chair alarm set Nurse Communication: Mobility status PT Visit Diagnosis: Other abnormalities of gait and mobility (R26.89);Muscle weakness (generalized) (M62.81);Pain Pain - Right/Left: Right Pain - part of body: Knee     Time: 6629-4765 PT Time Calculation (min) (ACUTE ONLY): 24 min  Charges:  $Therapeutic Exercise: 8-22 mins $Therapeutic Activity: 8-22 mins                     Zykerria Tanton A. Gilford Rile PT, DPT [facility] - Acute Rehabilitation Services    Kavi Almquist A Brydan Downard 04/21/2022, 10:00 AM

## 2022-04-21 NOTE — Progress Notes (Signed)
Occupational Therapy Treatment Patient Details Name: Shirley Nichols MRN: [id number] DOB: Oct 09, 1940 Today's Date: 04/21/2022   History of present illness Pt is an 81 y.o. female presenting to hospital 04/17/22 with generalized weakness.  Pt admitted with generalized weakness, leukocytosis, intersitital edema, malignant obstructive jaundice, htn, HLD, hypokalemia, seizure, elevated LFT's, and microcytic anemia.  PMH includes htn, HLD, COPD, skin CA, recently diagnosed pancreatic CA, s/p biliary stent placement d/t biliary obstruction, seizure, anemia.   OT comments  Upon entering session, pt resting in bed and agreeable to OT. Tx session targeted increasing activity tolerance for improved ADL completion. Pt completed functional mobility to the bathroom with Min guard using RW. Pt was able to complete toilet transfer from regular height toilet using grab bar. She then engaged in sinkside grooming tasks with set up-supervision. Pt was educated on energy conservation techniques with handout provided explaining the 4 P's (plan, prioritize, pace, position) as well as strategies for how to conserve energy for specific ADL tasks. Pt left as received with all needs in reach. Pt is making progress toward goal completion. D/C recommendations updated to [place].   Recommendations for follow up therapy are one component of a multi-disciplinary discharge planning process, led by the attending physician.  Recommendations may be updated based on patient status, additional functional criteria and insurance authorization.    Follow Up Recommendations  Home health OT     Assistance Recommended at Discharge Frequent or constant Supervision/Assistance  Patient can return home with the following  A little help with walking and/or transfers;A little help with bathing/dressing/bathroom;Assistance with cooking/housework;Assist for transportation;Help with stairs or ramp for entrance   Equipment Recommendations  BSC/3in1     Recommendations for Other Services      Precautions / Restrictions Precautions Precautions: Fall Precaution Comments: Seizure precautions Restrictions Weight Bearing Restrictions: No       Mobility Bed Mobility Overal bed mobility: Needs Assistance Bed Mobility: Supine to Sit, Sit to Supine   Sidelying to sit: Supervision, HOB elevated Supine to sit: Supervision     General bed mobility comments: increased time, use of bed rails    Transfers Overall transfer level: Needs assistance Equipment used: Rolling walker (2 wheels) Transfers: Sit to/from Stand Sit to Stand: Supervision           General transfer comment: STS from EOB     Balance Overall balance assessment: Needs assistance Sitting-balance support: No upper extremity supported, Feet supported Sitting balance-Leahy Scale: Good     Standing balance support: Bilateral upper extremity supported, During functional activity Standing balance-Leahy Scale: Fair                             ADL either performed or assessed with clinical judgement   ADL Overall ADL's : Needs assistance/impaired     Grooming: Set up;Standing;Wash/dry face;Wash/dry hands;Supervision/safety                   Camera operator (2 wheels);Regular Toilet;Grab bars;Ambulation;Min guard Armed forces technical officer Details (indicate cue type and reason): Min guard for controlled descent onto toilet Toileting- Clothing Manipulation and Hygiene: Supervision/safety;Min guard;Sitting/lateral lean;Sit to/from stand Toileting - Clothing Manipulation Details (indicate cue type and reason): peri care in sitting with supervision, clothing management in standing for mesh underwear with Min guard     Functional mobility during ADLs: Min guard;Rolling walker (2 wheels) (to the bathroom)      Extremity/Trunk Assessment Upper Extremity Assessment Upper Extremity Assessment:  Generalized weakness   Lower Extremity  Assessment Lower Extremity Assessment: Generalized weakness   Cervical / Trunk Assessment Cervical / Trunk Assessment: Normal    Vision Baseline Vision/History: 1 Wears glasses (reading) Patient Visual Report: No change from baseline     Perception     Praxis      Cognition Arousal/Alertness: Awake/alert Behavior During Therapy: WFL for tasks assessed/performed Overall Cognitive Status: Within Functional Limits for tasks assessed                                          Exercises      Shoulder Instructions       General Comments      Pertinent Vitals/ Pain       Pain Assessment Pain Assessment: Faces Pain Location: R knee Pain Descriptors / Indicators: Guarding, Discomfort Pain Intervention(s): Monitored during session  Home Living                                          Prior Functioning/Environment              Frequency  Min 2X/week        Progress Toward Goals  OT Goals(current goals can now be found in the care plan section)  Progress towards OT goals: Progressing toward goals  Acute Rehab OT Goals Patient Stated Goal: reduce pain and return to PLOF OT Goal Formulation: With patient Time For Goal Achievement: 05/02/22 Potential to Achieve Goals: Good  Plan Discharge plan needs to be updated    Co-evaluation                 AM-PAC OT "6 Clicks" Daily Activity     Outcome Measure   Help from another person eating meals?: None Help from another person taking care of personal grooming?: A Little Help from another person toileting, which includes using toliet, bedpan, or urinal?: A Little Help from another person bathing (including washing, rinsing, drying)?: A Little Help from another person to put on and taking off regular upper body clothing?: None Help from another person to put on and taking off regular lower body clothing?: A Little 6 Click Score: 20    End of Session Equipment Utilized  During Treatment: Gait belt;Rolling walker (2 wheels)  OT Visit Diagnosis: Unsteadiness on feet (R26.81);Muscle weakness (generalized) (M62.81);Pain Pain - Right/Left: Right Pain - part of body: Knee   Activity Tolerance Patient tolerated treatment well   Patient Left in bed;with call bell/phone within reach;with bed alarm set   Nurse Communication Mobility status        Time: 9379-0240 OT Time Calculation (min): 20 min  Charges: OT General Charges $OT Visit: 1 Visit OT Treatments $Self Care/Home Management : 8-22 mins  [facility] MS, OTR/L ascom [phone]  04/21/22, 5:24 PM

## 2022-04-21 NOTE — Progress Notes (Signed)
Nutrition Follow-up  DOCUMENTATION CODES:   Severe malnutrition in context of chronic illness  INTERVENTION:   -Continue regular diet -Continue MVI with minerals daily -Continue Ensure Enlive po TID, each supplement provides 350 kcal and 20 grams of protein.   NUTRITION DIAGNOSIS:   Severe Malnutrition related to chronic illness (pancreatic cancer) as evidenced by moderate fat depletion, severe fat depletion, moderate muscle depletion, severe muscle depletion, percent weight loss.  Ongoing  GOAL:   Patient will meet greater than or equal to 90% of their needs  Progressing   MONITOR:   PO intake, Supplement acceptance  REASON FOR ASSESSMENT:   Consult Assessment of nutrition requirement/status  ASSESSMENT:   Pt with medical history significant of hypertension, hyperlipidemia, COPD, skin cancer, recently diagnosed pancreatic cancer, s/p of biliary stent placement due to biliary obstruction, seizure, anemia, who presents with generalized weakness.  Reviewed I/O's: +120 ml x 24 hours and -419 ml since admission  UOP: 500 ml x 24 hours   Spoke with pt and husband at bedside. Pt more alert and interactive in comparison to last visit. Pt smiled at this RD and reports she is feeling better. She also shared how her family visited her yesterday and made her a plate to eat for the Thanksgiving holiday. Pt with improved oral intake; noted meal completions documented at 30-80%. Pt consumed about 75% of breakfast and 50% of Ensure.   Per MD notes, plan to discharge home with home health once medically stable.   Medications reviewed and include colace, magnesium oxide, dilation, and senokot.   Labs reviewed: K: 3.3, CBGS: 121 (inpatient orders for glycemic control are none).    Diet Order:   Diet Order             Diet regular Room service appropriate? 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  Estimated Nutritional Needs:   Kcal:  1700-1900  Protein:  85-100 grams  Fluid:  > 1.7 L    Loistine Chance, RD, LDN, [place] Registered Dietitian II Certified Diabetes Care and Education Specialist Please refer to [facility] for RD and/or RD on-call/weekend/after hours pager

## 2022-04-22 DIAGNOSIS — R531 Weakness: Secondary | ICD-10-CM | POA: Diagnosis not present

## 2022-04-22 DIAGNOSIS — C250 Malignant neoplasm of head of pancreas: Secondary | ICD-10-CM | POA: Diagnosis not present

## 2022-04-22 DIAGNOSIS — R7401 Elevation of levels of liver transaminase levels: Secondary | ICD-10-CM | POA: Diagnosis not present

## 2022-04-22 LAB — CULTURE, BLOOD (ROUTINE X 2)
Culture: NO GROWTH
Culture: NO GROWTH
Special Requests: ADEQUATE
Special Requests: ADEQUATE

## 2022-04-22 LAB — CBC
HCT: 22.9 % — ABNORMAL LOW (ref 36.0–46.0)
Hemoglobin: 7.3 g/dL — ABNORMAL LOW (ref 12.0–15.0)
MCH: 25.8 pg — ABNORMAL LOW (ref 26.0–34.0)
MCHC: 31.9 g/dL (ref 30.0–36.0)
MCV: 80.9 fL (ref 80.0–100.0)
Platelets: 474 10*3/uL — ABNORMAL HIGH (ref 150–400)
RBC: 2.83 MIL/uL — ABNORMAL LOW (ref 3.87–5.11)
RDW: 15.2 % (ref 11.5–15.5)
WBC: 11.3 10*3/uL — ABNORMAL HIGH (ref 4.0–10.5)
nRBC: 0 % (ref 0.0–0.2)

## 2022-04-22 LAB — COMPREHENSIVE METABOLIC PANEL
ALT: 124 U/L — ABNORMAL HIGH (ref 0–44)
AST: 66 U/L — ABNORMAL HIGH (ref 15–41)
Albumin: 2.2 g/dL — ABNORMAL LOW (ref 3.5–5.0)
Alkaline Phosphatase: 179 U/L — ABNORMAL HIGH (ref 38–126)
Anion gap: 10 (ref 5–15)
BUN: 30 mg/dL — ABNORMAL HIGH (ref 8–23)
CO2: 26 mmol/L (ref 22–32)
Calcium: 8.9 mg/dL (ref 8.9–10.3)
Chloride: 100 mmol/L (ref 98–111)
Creatinine, Ser: 0.78 mg/dL (ref 0.44–1.00)
GFR, Estimated: >60 mL/min (ref >60)
Glucose, Bld: 126 mg/dL — ABNORMAL HIGH (ref 70–99)
Potassium: 3.9 mmol/L (ref 3.5–5.1)
Sodium: 136 mmol/L (ref 135–145)
Total Bilirubin: 0.5 mg/dL (ref 0.3–1.2)
Total Protein: 6.3 g/dL — ABNORMAL LOW (ref 6.5–8.1)

## 2022-04-22 MED ORDER — PHENYTOIN SODIUM EXTENDED 100 MG PO CAPS: 100.0000 mg | ORAL_CAPSULE | Freq: Every day | ORAL | 0 refills | Status: DC

## 2022-04-22 NOTE — Discharge Summary (Signed)
Physician Discharge Summary  Shirley Nichols LSL:[id number] DOB: Apr 25, 1941 DOA: 04/17/2022  PCP: Remi Haggard, FNP  Admit date: 04/17/2022 Discharge date: 04/22/2022  Admitted From: home  Disposition:  home w/ home health   Recommendations for Outpatient Follow-up:  Follow up with PCP in 1-2 weeks F/u w/ oncology, Dr. Darrall Dears,, within 1 week. Get CBC to check Hb and Hct levels  Home Health: yes Equipment/Devices:  Discharge Condition: stable CODE STATUS: DNR  Diet recommendation: Regular  Brief/Interim Summary: HPI was taken from Dr. Blaine Hamper: Shirley Nichols is a 81 y.o. female with medical history significant of hypertension, hyperlipidemia, COPD, skin cancer, recently diagnosed pancreatic cancer, s/p of biliary stent placement due to biliary obstruction, seizure, anemia, who presents with generalized weakness.   Patient was recently hospitalized from 10/26 - 11/3.  Patient was diagnosed with pancreatic cancer with biliary obstruction.  Patient is s/p of biliary stent placement.  Patient states that in the past several days, she has progressively worsening weakness. She has very poor appetite and decreased oral intake.  She has intermittent low-grade subjective fever, no chills.  Her temperature is 99.3 in ED.  Patient has nausea, no vomiting, diarrhea or abdominal pain.  Patient was found to have low oxygen saturation to 90s, started on 2 L oxygen with 97% saturation.  Patient has mild dry cough, mild shortness breath, no chest pain.  Denies symptoms of UTI.  No rectal bleeding.   Data reviewed independently and ED Course: pt was found to have BNP 236, INR 1.3, WBC 15.2, negative COVID PCR, potassium 3.1, renal function okay, abnormal liver function (ALP 163, AST 100, ALT 124, total bilirubin 0.7), UA not impressive. Temperature 99.3, blood pressure 153/87, heart rate 84, RR 17.  Chest x-ray showed cardiomegaly and interstitial pulmonary edema. Pending LE venous doppler. Pt is placed  on telemetry bed for placement.   CT-abd/pelvis: 1. No acute findings or clear explanation for the patient's symptoms. 2. Stable appearance of the known pancreatic head mass with associated pancreatic ductal dilatation and pneumobilia following biliary stenting. No evidence of metastatic disease. 3. Aortic Atherosclerosis (ICD10-I70.0).   LE venous doppler: 1. No evidence of right lower extremity DVT. 2. In the popliteal fossa there is a 4.5 x 1.4 x 2.8 cm heterogenous oval fluid collection, which could represent Baker's cyst with hemorrhage or synovitis.  As per Dr. Leslye Peer: 81 year old female with past medical history of hypertension, hyperlipidemia, COPD, skin cancer, recently diagnosed pancreatic cancer status post middle biliary stent in the common bile duct, seizure, anemia.  She presents with generalized weakness and pains.  She was found to have a Baker's cyst in the right leg.    As per Dr. Jimmye Norman 11/22-11/25/23: Pt also found to have generalized weakness for which PT/OT were consulted. PT/OT evaluated the pt and recommended SNF. Pt's family refused SNF but agreed to home health. Also, pt was also found to have transaminitis but it was trending down daily. For more information, please see previous progress/consult notes.    Discharge Diagnoses:  Principal Problem:   General weakness Active Problems:   Baker's cyst of knee, right   Anemia of chronic disease   Pancreatic cancer (HCC)   Hypokalemia   Hypomagnesemia   Hypertension   Hyperlipidemia   Seizure (HCC)   Elevated LFTs   Microcytic anemia   Leukocytosis   Palliative care encounter   COPD (chronic obstructive pulmonary disease) (HCC)   Malnutrition of moderate degree   Generalized weakness   Protein-calorie malnutrition,  severe  General weakness: PT/OT recs SNF. Pt and pt's family refuse SNF but agreeable to home health    Baker's cyst of right knee: continue w/ supportive care    Anemia of chronic  disease: H&H are labile. Will transfuse if Hb < 7.0    Pancreatic adenocarcinoma: s/p biliary stent placement. Management as per onco    Hypomagnesemia: WNL today    Hypokalemia: WNL today    HLD: continue on zetia    Seizure: continue on home dose of keppra, dilantin    Transaminitis: likely secondary to pancreatic cancer. Trending down daily   Severe protein calorie malnutrition: continue w/ nutritional supplements     Thrombocytosis: etiology unclear. Will continue to monitor   Discharge Instructions  Discharge Instructions     Diet general   Complete by: As directed    Discharge instructions   Complete by: As directed    F/u w/ oncology, Dr. Darrall Dears,, within 1 week. Get CBC to check Hb and Hct levels. F/u w/ PCP in 1-2 weeks   Increase activity slowly   Complete by: As directed       Allergies as of 04/22/2022   No Known Allergies      Medication List     TAKE these medications    acetaminophen 325 MG tablet Commonly known as: TYLENOL Take 2 tablets (650 mg total) by mouth every 6 (six) hours as needed for mild pain, fever or headache.   aspirin EC 81 MG tablet Take 81 mg by mouth daily.   ezetimibe 10 MG tablet Commonly known as: ZETIA Take 1 tablet (10 mg total) by mouth every evening.   fluvastatin XL 80 MG 24 hr tablet Commonly known as: LESCOL XL Take 1 tablet (80 mg total) by mouth daily. What changed: Another medication with the same name was removed. Continue taking this medication, and follow the directions you see here.   levETIRAcetam 750 MG tablet Commonly known as: KEPPRA Take 1 tablet (750 mg total) by mouth 2 (two) times daily.   magnesium oxide 400 MG tablet Commonly known as: MAG-OX Take 2 tablets (800 mg total) by mouth 2 (two) times daily.   ondansetron 4 MG tablet Commonly known as: Zofran Take 1 tablet (4 mg total) by mouth every 8 (eight) hours as needed for nausea or vomiting.   oxyCODONE 5 MG immediate release  tablet Commonly known as: Oxy IR/ROXICODONE Take 1/2 tablet (2.5 mg total) by mouth every 8 (eight) hours as needed for moderate pain or severe pain.   phenytoin 100 MG ER capsule Commonly known as: DILANTIN Take 1 capsule (100 mg total) by mouth at bedtime for 7 days. What changed:  medication strength how much to take   polyethylene glycol 17 g packet Commonly known as: MIRALAX / GLYCOLAX Take 17 g by mouth daily.   senna-docusate 8.6-50 MG tablet Commonly known as: Senokot-S Take 1 tablet by mouth 2 (two) times daily.   Symbicort 160-4.5 MCG/ACT inhaler Generic drug: budesonide-formoterol Inhale 2 puffs into the lungs as needed (for wheezing or shortness of breath).               Durable Medical Equipment  (From admission, onward)           Start     Ordered   04/18/22 1702  For home use only DME Walker rolling  Once       Question Answer Comment  Walker: With 5 Inch Wheels   Patient needs a walker to  treat with the following condition Other abnormalities of gait and mobility      04/18/22 1701   04/18/22 1700  For home use only DME Bedside commode  Once       Question:  Patient needs a bedside commode to treat with the following condition  Answer:  Other abnormalities of gait and mobility   04/18/22 1700   04/18/22 1658  For home use only DME lightweight manual wheelchair with seat cushion  Once       Comments: Patient suffers from R knee pain which impairs their ability to perform daily activities like bathing, dressing, and toileting in the home.  A walker will not resolve  issue with performing activities of daily living. A wheelchair will allow patient to safely perform daily activities. Patient is not able to propel themselves in the home using a standard weight wheelchair due to general weakness. Patient can self propel in the lightweight wheelchair. Length of need 6 months . Accessories: elevating leg rests (ELRs), wheel locks, extensions and  anti-tippers.   04/18/22 1658            No Known Allergies  Consultations: Onco Palliative care    Procedures/Studies: ECHOCARDIOGRAM COMPLETE  Result Date: 04/18/2022    ECHOCARDIOGRAM REPORT   Patient Name:   Shirley Nichols Date of Exam: 04/18/2022 Medical Rec #:  [id number]        Height:       64.0 in Accession #:    [id number]       Weight:       123.0 lb Date of Birth:  1940-12-07         BSA:          1.591 m Patient Age:    59 years         BP:           154/70 mmHg Patient Gender: F                HR:           83 bpm. Exam Location:  [facility] Procedure: 2D Echo, Cardiac Doppler and Color Doppler Indications:     CHF-acute diastolic V76.16  History:         Patient has no prior history of Echocardiogram examinations.                  Risk Factors:Hypertension and Dyslipidemia.  Sonographer:     Sherrie Sport Referring Phys:  [id number] Soledad Gerlach NIU Diagnosing Phys: Serafina Royals MD  Sonographer Comments: Image quality was good. IMPRESSIONS  1. Left ventricular ejection fraction, by estimation, is 60 to 65%. The left ventricle has normal function. The left ventricle has no regional wall motion abnormalities. Left ventricular diastolic parameters were normal.  2. Right ventricular systolic function is normal. The right ventricular size is normal.  3. The mitral valve is normal in structure. Trivial mitral valve regurgitation.  4. The aortic valve is normal in structure. Aortic valve regurgitation is not visualized. FINDINGS  Left Ventricle: Left ventricular ejection fraction, by estimation, is 60 to 65%. The left ventricle has normal function. The left ventricle has no regional wall motion abnormalities. The left ventricular internal cavity size was normal in size. There is  no left ventricular hypertrophy. Left ventricular diastolic parameters were normal. Right Ventricle: The right ventricular size is normal. No increase in right ventricular wall thickness. Right ventricular systolic function is  normal. Left Atrium: Left atrial size was normal in size.  Right Atrium: Right atrial size was normal in size. Pericardium: There is no evidence of pericardial effusion. Mitral Valve: The mitral valve is normal in structure. Trivial mitral valve regurgitation. Tricuspid Valve: The tricuspid valve is normal in structure. Tricuspid valve regurgitation is trivial. Aortic Valve: The aortic valve is normal in structure. Aortic valve regurgitation is not visualized. Aortic valve mean gradient measures 5.0 mmHg. Aortic valve peak gradient measures 9.5 mmHg. Aortic valve area, by VTI measures 2.83 cm. Pulmonic Valve: The pulmonic valve was normal in structure. Pulmonic valve regurgitation is trivial. Aorta: The aortic root and ascending aorta are structurally normal, with no evidence of dilitation. IAS/Shunts: No atrial level shunt detected by color flow Doppler.  LEFT VENTRICLE PLAX 2D LVIDd:         4.10 cm   Diastology LVIDs:         3.00 cm   LV e' medial:    4.68 cm/s LV PW:         1.10 cm   LV E/e' medial:  19.8 LV IVS:        1.00 cm   LV e' lateral:   7.40 cm/s LVOT diam:     2.10 cm   LV E/e' lateral: 12.5 LV SV:         77 LV SV Index:   48 LVOT Area:     3.46 cm  RIGHT VENTRICLE RV Basal diam:  2.60 cm RV Mid diam:    2.00 cm RV S prime:     13.90 cm/s TAPSE (M-mode): 2.5 cm LEFT ATRIUM             Index        RIGHT ATRIUM           Index LA diam:        3.20 cm 2.01 cm/m   RA Area:     14.50 cm LA Vol (A2C):   43.7 ml 27.46 ml/m  RA Volume:   32.70 ml  20.55 ml/m LA Vol (A4C):   27.1 ml 17.03 ml/m LA Biplane Vol: 37.0 ml 23.25 ml/m  AORTIC VALVE AV Area (Vmax):    2.52 cm AV Area (Vmean):   2.43 cm AV Area (VTI):     2.83 cm AV Vmax:           154.00 cm/s AV Vmean:          102.000 cm/s AV VTI:            0.272 m AV Peak Grad:      9.5 mmHg AV Mean Grad:      5.0 mmHg LVOT Vmax:         112.00 cm/s LVOT Vmean:        71.500 cm/s LVOT VTI:          0.222 m LVOT/AV VTI ratio: 0.82  AORTA Ao Root diam:  3.30 cm MITRAL VALVE                TRICUSPID VALVE MV Area (PHT): 3.77 cm     TR Peak grad:   14.3 mmHg MV Decel Time: 201 msec     TR Vmax:        189.00 cm/s MV E velocity: 92.80 cm/s MV A velocity: 129.00 cm/s  SHUNTS MV E/A ratio:  0.72         Systemic VTI:  0.22 m  Systemic Diam: 2.10 cm Serafina Royals MD Electronically signed by Serafina Royals MD Signature Date/Time: 04/18/2022/12:49:06 PM    Final    CT ABDOMEN PELVIS W CONTRAST  Result Date: 04/17/2022 CLINICAL DATA:  Recently diagnosed pancreatic cancer. Recent sphincterotomy and biliary stent placement. Progressive weakness. * Tracking Code: BO * EXAM: CT ABDOMEN AND PELVIS WITH CONTRAST TECHNIQUE: Multidetector CT imaging of the abdomen and pelvis was performed using the standard protocol following bolus administration of intravenous contrast. RADIATION DOSE REDUCTION: This exam was performed according to the departmental dose-optimization program which includes automated exposure control, adjustment of the mA and/or kV according to patient size and/or use of iterative reconstruction technique. CONTRAST:  173m OMNIPAQUE IOHEXOL 300 MG/ML  SOLN COMPARISON:  Abdominopelvic CT 04/05/2022. Abdominal MRI 03/03/2022. FINDINGS: Lower chest: Patchy atelectasis at both lung bases. No pleural effusion or suspicious pulmonary nodule. There is atherosclerosis of the aorta and coronary arteries. Hepatobiliary: Metallic biliary stent remains in place with persistent pneumobilia. The gallbladder is decompressed, without wall thickening or stones. Unchanged appearance of the liver without suspicious abnormality. Pancreas: Known hypodense mass involving the pancreatic head is similar to previous study, measuring approximately 2.1 x 1.3 cm on image 29/3. Stable pancreatic ductal dilatation and mild surrounding inflammation. Spleen: Normal in size without focal abnormality. Adrenals/Urinary Tract: Unchanged 2.2 cm left adrenal nodule  which was previously characterized by noncontrast CT as an adenoma. No follow-up imaging recommended. The right adrenal gland appears normal. No evidence of urinary tract calculus, suspicious renal lesion or hydronephrosis. Unchanged bilateral renal cysts; no follow-up imaging recommended. The bladder appears unremarkable for its degree of distention. Stomach/Bowel: No enteric contrast administered. The stomach appears unremarkable for its degree of distension. No evidence of bowel wall thickening, distention or surrounding inflammatory change. Vascular/Lymphatic: There are no enlarged abdominal or pelvic lymph nodes. Diffuse aortic and branch vessel atherosclerosis without evidence of aneurysm or large vessel occlusion. No gross vascular encasement identified in the porta hepatis. Reproductive: Status post hysterectomy.  No adnexal mass. Other: No ascites, free air or peritoneal nodularity. Previously noted small subcutaneous nodule within the left anterior abdominal wall has slightly decreased in size and may be related to previous subcutaneous injection. Musculoskeletal: No acute or significant osseous findings. Facet hypertrophy in the lower lumbar spine with a degenerative grade 1 anterolisthesis at L4-5. IMPRESSION: 1. No acute findings or clear explanation for the patient's symptoms. 2. Stable appearance of the known pancreatic head mass with associated pancreatic ductal dilatation and pneumobilia following biliary stenting. No evidence of metastatic disease. 3. Aortic Atherosclerosis (ICD10-I70.0). Electronically Signed   By: WRichardean SaleM.D.   On: 04/17/2022 16:43   [place]ous Img Lower Unilateral Right  Result Date: 04/17/2022 CLINICAL DATA:  Swelling EXAM: Right LOWER EXTREMITY VENOUS DOPPLER ULTRASOUND TECHNIQUE: Gray-scale sonography with compression, as well as color and duplex ultrasound, were performed to evaluate the deep venous system(s) from the level of the common femoral vein through the  popliteal and proximal calf veins. COMPARISON:  None Available. FINDINGS: VENOUS Normal compressibility of the common femoral, superficial femoral, and popliteal veins, as well as the visualized calf veins. Visualized portions of profunda femoral vein and great saphenous vein unremarkable. No filling defects to suggest DVT on grayscale or color Doppler imaging. Doppler waveforms show normal direction of venous flow, normal respiratory plasticity and response to augmentation. OTHER In the popliteal fossa there is a 4.5 x 1.4 x 2.8 cm heterogenous oval fluid collection. There is no flow within or around this collection.  This is incompletely assessed on the provided images. Limitations: none IMPRESSION: 1. No evidence of right lower extremity DVT. 2. In the popliteal fossa there is a 4.5 x 1.4 x 2.8 cm heterogenous oval fluid collection, which could represent Baker's cyst with hemorrhage or synovitis. Electronically Signed   By: Marin Roberts M.D.   On: 04/17/2022 10:26   DG Chest Portable 1 View  Result Date: 04/17/2022 CLINICAL DATA:  Weakness and fatigue in a female at age 66. EXAM: PORTABLE CHEST 1 VIEW COMPARISON:  March 14, 2022. FINDINGS: EKG leads project over the chest. Cardiomediastinal contours and hilar structures with persistent cardiac enlargement. Increased interstitial markings improved compared to most recent comparison imaging. No sign of lobar consolidative process. No pneumothorax. On limited assessment no acute skeletal findings. IMPRESSION: Cardiomegaly with improved interstitial markings compared to most recent comparison imaging. No sign of lobar consolidative process. Difficult to exclude mild interstitial edema, difficult to differentiate from background chronic changes. Electronically Signed   By: Zetta Bills M.D.   On: 04/17/2022 09:34   CT ABD PELVIS W/WO CM ONCOLOGY PANCREATIC PROTOCOL  Result Date: 04/07/2022 CLINICAL DATA:  Pancreatic head adenocarcinoma diagnosed on  03/05/2022 ERCP. Staging. * Tracking Code: BO * EXAM: CT ABDOMEN AND PELVIS WITHOUT AND WITH CONTRAST TECHNIQUE: Multidetector CT imaging of the abdomen and pelvis was performed following the standard protocol before and following the bolus administration of intravenous contrast. RADIATION DOSE REDUCTION: This exam was performed according to the departmental dose-optimization program which includes automated exposure control, adjustment of the mA and/or kV according to patient size and/or use of iterative reconstruction technique. CONTRAST:  71m OMNIPAQUE IOHEXOL 300 MG/ML  SOLN COMPARISON:  03/03/2022 MRI abdomen and unenhanced CT abdomen/pelvis FINDINGS: Lower chest: No significant pulmonary nodules or acute consolidative airspace disease. Coronary atherosclerosis. Hepatobiliary: Normal liver size. Subcapsular hypodense 0.8 cm far inferior right liver lesion (series 9/image 57), too small to characterize, stable from 03/03/2022 MRI abdomen study, where it was T2 hyperintense and compatible with a tiny benign cyst. No additional liver lesions. Metallic CBD stent in place extending from the mid CBD to the descending duodenal lumen. Pneumobilia throughout the intrahepatic bile ducts and nondistended gallbladder indicating stent patency. Proximal CBD diameter 7 mm. Mild central intrahepatic biliary ductal dilatation. Pancreas: Poorly defined hypoenhancing 2.9 x 2.4 cm pancreatic head mass (series 9/image 47), previously 2.9 x 2.3 cm on 03/03/2022 MRI using similar measurement technique, not appreciably changed. No clear tumor involvement of the SMV, portal veins or SMA. Dilated main pancreatic duct (6 mm diameter) with abrupt caliber transition at the level of the pancreatic head. Diffuse haziness of the peripancreatic fat without measurable peripancreatic fluid collections. No evidence of pancreatic parenchymal necrosis. Tiny 1.2 x 0.6 cm pancreatic body pseudocyst (series 9/image 44), slightly increased from 0.8 x  0.4 cm on 03/03/2022 MRI. Spleen: Normal size. No mass. Adrenals/Urinary Tract: Left adrenal 2.2 x 1.8 cm nodule with precontrast density 4 HU, compatible with an adenoma. Mildly irregular right adrenal gland is unchanged, without discrete right adrenal nodules. No hydronephrosis. Small simple bilateral renal cysts, largest 1.4 cm in the anterior upper right kidney. No suspicious renal masses. Normal bladder. Stomach/Bowel: Small hiatal hernia. Otherwise normal nondistended stomach. Normal caliber small bowel with no small bowel wall thickening. Appendix not discretely visualized. Normal large bowel with no diverticulosis, large bowel wall thickening or pericolonic fat stranding. Vascular/Lymphatic: Atherosclerotic nonaneurysmal abdominal aorta. Patent portal, splenic, hepatic and renal veins. No pathologically enlarged lymph nodes in the abdomen or  pelvis. Reproductive: Status post hysterectomy, with no abnormal findings at the vaginal cuff. No adnexal mass. Other: No pneumoperitoneum, ascites or focal fluid collection. Nonspecific subcutaneous 0.8 cm ventral left abdominal wall soft tissue density nodule (series 9/image 85), new since 03/03/2022 CT. Musculoskeletal: No aggressive appearing focal osseous lesions. Mild thoracolumbar spondylosis. IMPRESSION: 1. Poorly defined hypoenhancing 2.9 cm pancreatic head mass, not appreciably changed since 03/03/2022 MRI, compatible with known primary pancreatic adenocarcinoma. No evidence of vascular invasion by the pancreatic tumor. 2. Dilated main pancreatic duct with abrupt caliber transition at the level of the pancreatic head. Well-positioned CBD stent with pneumobilia indicating stent patency. 3. No lymphadenopathy or other findings highly suspicious for metastatic disease in the abdomen or pelvis. Nonspecific subcutaneous 0.8 cm ventral left abdominal wall soft tissue density nodule, new since 03/03/2022 CT, most commonly due to subcutaneous injections. 4. Evidence of  ongoing pancreatitis. Tiny 1.2 x 0.6 cm pancreatic body pseudocyst, slightly increased. 5. 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 6. Small hiatal hernia. 7. Coronary atherosclerosis. 8.  Aortic Atherosclerosis (ICD10-I70.0). Electronically Signed   By: Ilona Sorrel M.D.   On: 04/07/2022 16:08   (Echo, Carotid, EGD, Colonoscopy, ERCP)    Subjective: Pt denies any complaints    Discharge Exam: Vitals:   04/22/22 0422 04/22/22 0754  BP: (!) 146/59 (!) 151/66  Pulse: 85 87  Resp: 18 16  Temp: 98.5 F (36.9 C) 98.2 F (36.8 C)  SpO2: 96% 93%   Vitals:   04/21/22 1613 04/21/22 2056 04/22/22 0422 04/22/22 0754  BP: (!) 136/51 (!) 150/57 (!) 146/59 (!) 151/66  Pulse: 75 81 85 87  Resp: '16 18 18 16  '$ Temp: 98 F (36.7 C) 98.1 F (36.7 C) 98.5 F (36.9 C) 98.2 F (36.8 C)  TempSrc:  Oral Oral   SpO2: 96% 97% 96% 93%  Weight:      Height:        General: Pt is alert, awake, not in acute distress.  Cardiovascular: S1/S2 +, no rubs, no gallops Respiratory: CTA bilaterally, no wheezing, no rhonchi Abdominal: Soft, NT, ND, bowel sounds + Extremities: no cyanosis    The results of significant diagnostics from this hospitalization (including imaging, microbiology, ancillary and laboratory) are listed below for reference.     Microbiology: Recent Results (from the past 240 hour(s))  Resp Panel by RT-PCR (Flu A&B, Covid) Anterior Nasal Swab     Status: None   Collection Time: 04/17/22  9:13 AM   Specimen: Anterior Nasal Swab  Result Value Ref Range Status   SARS Coronavirus 2 by RT PCR NEGATIVE NEGATIVE Final    Comment: (NOTE) SARS-CoV-2 target nucleic acids are NOT DETECTED.  The SARS-CoV-2 RNA is generally detectable in upper respiratory specimens during the acute phase of infection. The lowest concentration of SARS-CoV-2 viral copies this assay can detect is 138 copies/mL. A negative result does not preclude SARS-Cov-2 infection and should not  be used as the sole basis for treatment or other patient management decisions. A negative result may occur with  improper specimen collection/handling, submission of specimen other than nasopharyngeal swab, presence of viral mutation(s) within the areas targeted by this assay, and inadequate number of viral copies(<138 copies/mL). A negative result must be combined with clinical observations, patient history, and epidemiological information. The expected result is Negative.  Fact Sheet for Patients:  EntrepreneurPulse.com.au  Fact Sheet for Healthcare Providers:  IncredibleEmployment.be  This test is no t yet approved or cleared by the [place]  States FDA and  has been authorized for detection and/or diagnosis of SARS-CoV-2 by FDA under an Emergency Use Authorization (EUA). This EUA will remain  in effect (meaning this test can be used) for the duration of the COVID-19 declaration under Section 564(b)(1) of the Act, 21 U.S.C.section 360bbb-3(b)(1), unless the authorization is terminated  or revoked sooner.       Influenza A by PCR NEGATIVE NEGATIVE Final   Influenza B by PCR NEGATIVE NEGATIVE Final    Comment: (NOTE) The Xpert Xpress SARS-CoV-2/FLU/RSV plus assay is intended as an aid in the diagnosis of influenza from Nasopharyngeal swab specimens and should not be used as a sole basis for treatment. Nasal washings and aspirates are unacceptable for Xpert Xpress SARS-CoV-2/FLU/RSV testing.  Fact Sheet for Patients: EntrepreneurPulse.com.au  Fact Sheet for Healthcare Providers: IncredibleEmployment.be  This test is not yet approved or cleared by the [place] FDA and has been authorized for detection and/or diagnosis of SARS-CoV-2 by FDA under an Emergency Use Authorization (EUA). This EUA will remain in effect (meaning this test can be used) for the duration of the COVID-19 declaration under Section  564(b)(1) of the Act, 21 U.S.C. section 360bbb-3(b)(1), unless the authorization is terminated or revoked.  Performed at [facility], [address] [place], [place] [address]   Urine Culture     Status: None   Collection Time: 04/17/22 11:45 AM   Specimen: Urine, Clean Catch  Result Value Ref Range Status   Specimen Description   Final    URINE, CLEAN CATCH Performed at [facility], [address] [place], [place] [address]    Special Requests   Final    NONE Performed at [facility], [address] [place], [place] [address]    Culture   Final    NO GROWTH Performed at [facility], [place] [address] [place], [place] [address]    Report Status 04/18/2022 FINAL  Final  Culture, blood (Routine X 2) w Reflex to ID Panel     Status: None   Collection Time: 04/17/22 12:46 PM   Specimen: BLOOD  Result Value Ref Range Status   Specimen Description BLOOD RIGHT FA  Final   Special Requests   Final    BOTTLES DRAWN AEROBIC AND ANAEROBIC Blood Culture adequate volume   Culture   Final    NO GROWTH 5 DAYS Performed at [facility], [address] [place], [place] [address]    Report Status 04/22/2022 FINAL  Final  Culture, blood (Routine X 2) w Reflex to ID Panel     Status: None   Collection Time: 04/17/22 12:54 PM   Specimen: BLOOD  Result Value Ref Range Status   Specimen Description BLOOD LEFT HAND  Final   Special Requests   Final    BOTTLES DRAWN AEROBIC AND ANAEROBIC Blood Culture adequate volume   Culture   Final    NO GROWTH 5 DAYS Performed at [facility], [address] [place],  [address]    Report Status 04/22/2022 FINAL  Final     Labs: BNP (last 3 results) Recent Labs    04/17/22 0913  BNP 947.0*   Basic Metabolic Panel: Recent Labs  Lab 04/17/22 0913 04/18/22 0502 04/19/22 0335 04/20/22 0402 04/21/22 0605 04/22/22 0438  NA 136 136 135 135 139 136  K 3.1* 3.4* 3.5 3.5 3.3* 3.9   CL 99 101 100 99 101 100  CO2 '27 28 26 25 29 '$ 26  GLUCOSE 141* 115* 127* 127* 124* 126*  BUN '18 17 20 '$ 29* 31* 30*  CREATININE 0.87 0.80 0.74 0.84 0.71 0.78  CALCIUM 8.5* 8.1* 8.7* 8.8* 8.9 8.9  MG 1.7 1.7 1.7  --   --   --    Liver Function Tests: Recent Labs  Lab 04/17/22 0913 04/19/22 0335 04/20/22 0402 04/21/22 0605 04/22/22 0438  AST 100* 177* 109* 71* 66*  ALT 124* 207* 173* 136* 124*  ALKPHOS 163* 188* 186* 160* 179*  BILITOT 0.7 0.6 0.6 0.6 0.5  PROT 6.9 6.4* 6.0* 6.2* 6.3*  ALBUMIN 2.4* 2.1* 2.0* 2.1* 2.2*   No results for input(s): "LIPASE", "AMYLASE" in the last 168 hours. No results for input(s): "AMMONIA" in the last 168 hours. CBC: Recent Labs  Lab 04/18/22 0502 04/19/22 0335 04/20/22 0402 04/21/22 0605 04/22/22 0438  WBC 12.9* 11.7* 11.3* 9.1 11.3*  HGB 7.5* 8.0* 8.0* 7.5* 7.3*  HCT 22.6* 24.9* 24.6* 23.5* 22.9*  MCV 79.6* 79.6* 79.6* 80.5 80.9  PLT 365 414* 443* 451* 474*   Cardiac Enzymes: No results for input(s): "CKTOTAL", "CKMB", "CKMBINDEX", "TROPONINI" in the last 168 hours. BNP: Invalid input(s): "POCBNP" CBG: No results for input(s): "GLUCAP" in the last 168 hours. D-Dimer No results for input(s): "DDIMER" in the last 72 hours. Hgb A1c No results for input(s): "HGBA1C" in the last 72 hours. Lipid Profile No results for input(s): "CHOL", "HDL", "LDLCALC", "TRIG", "CHOLHDL", "LDLDIRECT" in the last 72 hours. Thyroid function studies No results for input(s): "TSH", "T4TOTAL", "T3FREE", "THYROIDAB" in the last 72 hours.  Invalid input(s): "FREET3" Anemia work up No results for input(s): "VITAMINB12", "FOLATE", "FERRITIN", "TIBC", "IRON", "RETICCTPCT" in the last 72 hours. Urinalysis    Component Value Date/Time   COLORURINE YELLOW (A) 04/17/2022 1145   APPEARANCEUR CLEAR (A) 04/17/2022 1145   LABSPEC 1.017 04/17/2022 1145   PHURINE 6.0 04/17/2022 1145   GLUCOSEU NEGATIVE 04/17/2022 1145   HGBUR NEGATIVE 04/17/2022 1145    BILIRUBINUR NEGATIVE 04/17/2022 1145   KETONESUR NEGATIVE 04/17/2022 1145   PROTEINUR 100 (A) 04/17/2022 1145   NITRITE NEGATIVE 04/17/2022 1145   LEUKOCYTESUR NEGATIVE 04/17/2022 1145   Sepsis Labs Recent Labs  Lab 04/19/22 0335 04/20/22 0402 04/21/22 0605 04/22/22 0438  WBC 11.7* 11.3* 9.1 11.3*   Microbiology Recent Results (from the past 240 hour(s))  Resp Panel by RT-PCR (Flu A&B, Covid) Anterior Nasal Swab     Status: None   Collection Time: 04/17/22  9:13 AM   Specimen: Anterior Nasal Swab  Result Value Ref Range Status   SARS Coronavirus 2 by RT PCR NEGATIVE NEGATIVE Final    Comment: (NOTE) SARS-CoV-2 target nucleic acids are NOT DETECTED.  The SARS-CoV-2 RNA is generally detectable in upper respiratory specimens during the acute phase of infection. The lowest concentration of SARS-CoV-2 viral copies this assay can detect is 138 copies/mL. A negative result does not preclude SARS-Cov-2 infection and should not be used as the sole basis for treatment or other patient management decisions. A negative result may occur with  improper specimen collection/handling, submission of specimen other than nasopharyngeal swab, presence of viral mutation(s) within the areas targeted by this assay, and inadequate number of viral copies(<138 copies/mL). A negative result must be combined with clinical observations, patient history, and epidemiological information. The expected result is Negative.  Fact Sheet for Patients:  EntrepreneurPulse.com.au  Fact Sheet for Healthcare Providers:  IncredibleEmployment.be  This test is no t yet approved or cleared by the [place] FDA and  has  been authorized for detection and/or diagnosis of SARS-CoV-2 by FDA under an Emergency Use Authorization (EUA). This EUA will remain  in effect (meaning this test can be used) for the duration of the COVID-19 declaration under Section 564(b)(1) of the Act,  21 U.S.C.section 360bbb-3(b)(1), unless the authorization is terminated  or revoked sooner.       Influenza A by PCR NEGATIVE NEGATIVE Final   Influenza B by PCR NEGATIVE NEGATIVE Final    Comment: (NOTE) The Xpert Xpress SARS-CoV-2/FLU/RSV plus assay is intended as an aid in the diagnosis of influenza from Nasopharyngeal swab specimens and should not be used as a sole basis for treatment. Nasal washings and aspirates are unacceptable for Xpert Xpress SARS-CoV-2/FLU/RSV testing.  Fact Sheet for Patients: EntrepreneurPulse.com.au  Fact Sheet for Healthcare Providers: IncredibleEmployment.be  This test is not yet approved or cleared by the [place] FDA and has been authorized for detection and/or diagnosis of SARS-CoV-2 by FDA under an Emergency Use Authorization (EUA). This EUA will remain in effect (meaning this test can be used) for the duration of the COVID-19 declaration under Section 564(b)(1) of the Act, 21 U.S.C. section 360bbb-3(b)(1), unless the authorization is terminated or revoked.  Performed at [facility], [address] [place], [place] [address]   Urine Culture     Status: None   Collection Time: 04/17/22 11:45 AM   Specimen: Urine, Clean Catch  Result Value Ref Range Status   Specimen Description   Final    URINE, CLEAN CATCH Performed at [facility], [address], [place], [place] [address]    Special Requests   Final    NONE Performed at [facility], [address], [place], [place] [address]    Culture   Final    NO GROWTH Performed at [facility], [place] [address] [place], [place] [address]    Report Status 04/18/2022 FINAL  Final  Culture, blood (Routine X 2) w Reflex to ID Panel     Status: None   Collection Time: 04/17/22 12:46 PM   Specimen: BLOOD  Result Value Ref Range Status   Specimen Description BLOOD RIGHT FA  Final   Special Requests   Final     BOTTLES DRAWN AEROBIC AND ANAEROBIC Blood Culture adequate volume   Culture   Final    NO GROWTH 5 DAYS Performed at [facility], [address] [place], [place] [address]    Report Status 04/22/2022 FINAL  Final  Culture, blood (Routine X 2) w Reflex to ID Panel     Status: None   Collection Time: 04/17/22 12:54 PM   Specimen: BLOOD  Result Value Ref Range Status   Specimen Description BLOOD LEFT HAND  Final   Special Requests   Final    BOTTLES DRAWN AEROBIC AND ANAEROBIC Blood Culture adequate volume   Culture   Final    NO GROWTH 5 DAYS Performed at [facility], [address] [place], [place] [address]    Report Status 04/22/2022 FINAL  Final     Time coordinating discharge: Over 30 minutes  SIGNED:   Wyvonnia Dusky, MD  [facility] 04/22/2022, 10:24 AM Pager   If 7PM-7AM, please contact night-coverage [email or web address]

## 2022-04-22 NOTE — TOC Transition Note (Signed)
Transition of Care [facility]) - CM/SW Discharge Note   Patient Details  Name: Shirley Nichols MRN: [id number] Date of Birth: 1940/11/12  [facility] CM/SW Contact:  Harriet Masson, RN Phone Number:[phone] 04/22/2022, 1:44 PM   Clinical Narrative:    Pt discharged today with the resumptions of [place] services. RN has informed [facility] of pt's discharge today. No other request or concerns at this time.   Final next level of care: [place] Barriers to Discharge: Barriers Resolved   Patient Goals and CMS Choice        Discharge Placement                       Discharge Plan and Services                                     Social Determinants of Health (SDOH) Interventions     Readmission Risk Interventions    03/23/2022    2:34 PM 03/10/2022    1:01 PM  Readmission Risk Prevention Plan  Transportation Screening  Complete  PCP or Specialist Appt within 5-7 Days  Complete  Home Care Screening  Complete  Medication Review (RN CM)  Complete  HRI or Home Care Consult Complete   Social Work Consult for [place] Planning/Counseling Complete   Palliative Care Screening Complete   Medication Review Press photographer) Complete

## 2022-04-25 ENCOUNTER — Encounter: Payer: Self-pay | Admitting: Internal Medicine

## 2022-04-25 ENCOUNTER — Other Ambulatory Visit (HOSPITAL_COMMUNITY): Payer: Self-pay

## 2022-04-25 ENCOUNTER — Inpatient Hospital Stay: Payer: PPO

## 2022-04-25 ENCOUNTER — Inpatient Hospital Stay (HOSPITAL_BASED_OUTPATIENT_CLINIC_OR_DEPARTMENT_OTHER): Payer: PPO | Admitting: Internal Medicine

## 2022-04-25 ENCOUNTER — Inpatient Hospital Stay (HOSPITAL_BASED_OUTPATIENT_CLINIC_OR_DEPARTMENT_OTHER): Payer: PPO | Admitting: Hospice and Palliative Medicine

## 2022-04-25 VITALS — BP 114/85 | HR 79 | Temp 97.0°F | Resp 18 | Wt 119.8 lb

## 2022-04-25 DIAGNOSIS — E43 Unspecified severe protein-calorie malnutrition: Secondary | ICD-10-CM | POA: Diagnosis not present

## 2022-04-25 DIAGNOSIS — Z515 Encounter for palliative care: Secondary | ICD-10-CM

## 2022-04-25 DIAGNOSIS — R531 Weakness: Secondary | ICD-10-CM

## 2022-04-25 DIAGNOSIS — C250 Malignant neoplasm of head of pancreas: Secondary | ICD-10-CM | POA: Diagnosis not present

## 2022-04-25 DIAGNOSIS — D509 Iron deficiency anemia, unspecified: Secondary | ICD-10-CM

## 2022-04-25 DIAGNOSIS — D638 Anemia in other chronic diseases classified elsewhere: Secondary | ICD-10-CM

## 2022-04-25 LAB — CBC WITH DIFFERENTIAL/PLATELET
Abs Immature Granulocytes: 0.07 10*3/uL (ref 0.00–0.07)
Basophils Absolute: 0.1 10*3/uL (ref 0.0–0.1)
Basophils Relative: 1 %
Eosinophils Absolute: 0.1 10*3/uL (ref 0.0–0.5)
Eosinophils Relative: 1 %
HCT: 24.8 % — ABNORMAL LOW (ref 36.0–46.0)
Hemoglobin: 7.8 g/dL — ABNORMAL LOW (ref 12.0–15.0)
Immature Granulocytes: 1 %
Lymphocytes Relative: 17 %
Lymphs Abs: 1.8 10*3/uL (ref 0.7–4.0)
MCH: 26.1 pg (ref 26.0–34.0)
MCHC: 31.5 g/dL (ref 30.0–36.0)
MCV: 82.9 fL (ref 80.0–100.0)
Monocytes Absolute: 0.5 10*3/uL (ref 0.1–1.0)
Monocytes Relative: 4 %
Neutro Abs: 7.8 10*3/uL — ABNORMAL HIGH (ref 1.7–7.7)
Neutrophils Relative %: 76 %
Platelets: 531 10*3/uL — ABNORMAL HIGH (ref 150–400)
RBC: 2.99 MIL/uL — ABNORMAL LOW (ref 3.87–5.11)
RDW: 15.7 % — ABNORMAL HIGH (ref 11.5–15.5)
WBC: 10.3 10*3/uL (ref 4.0–10.5)
nRBC: 0 % (ref 0.0–0.2)

## 2022-04-25 LAB — COMPREHENSIVE METABOLIC PANEL
ALT: 90 U/L — ABNORMAL HIGH (ref 0–44)
AST: 40 U/L (ref 15–41)
Albumin: 2.6 g/dL — ABNORMAL LOW (ref 3.5–5.0)
Alkaline Phosphatase: 176 U/L — ABNORMAL HIGH (ref 38–126)
Anion gap: 9 (ref 5–15)
BUN: 22 mg/dL (ref 8–23)
CO2: 26 mmol/L (ref 22–32)
Calcium: 8.4 mg/dL — ABNORMAL LOW (ref 8.9–10.3)
Chloride: 97 mmol/L — ABNORMAL LOW (ref 98–111)
Creatinine, Ser: 0.77 mg/dL (ref 0.44–1.00)
GFR, Estimated: >60 mL/min (ref >60)
Glucose, Bld: 164 mg/dL — ABNORMAL HIGH (ref 70–99)
Potassium: 3.6 mmol/L (ref 3.5–5.1)
Sodium: 132 mmol/L — ABNORMAL LOW (ref 135–145)
Total Bilirubin: 0.4 mg/dL (ref 0.3–1.2)
Total Protein: 7.2 g/dL (ref 6.5–8.1)

## 2022-04-25 LAB — SAMPLE TO BLOOD BANK

## 2022-04-25 NOTE — Progress Notes (Signed)
[facility] CONSULT NOTE  Patient Care Team: Remi Haggard, FNP as PCP - General (Family Medicine) Clent Jacks, RN as Oncology Nurse Navigator  REFERRING PROVIDER: Dr. Alvy Bimler  REASON FOR REFFERAL: pancreatic adenocarcinoma  CANCER STAGING   Cancer Staging  Pancreatic cancer [facility]) Staging form: Exocrine Pancreas, AJCC 8th Edition - Clinical stage from 03/09/2022: Stage IB (cT2, cN0, cM0) - Signed by Heath Lark, MD on 03/09/2022 Stage prefix: Initial diagnosis Total positive nodes: 0   ASSESSMENT & PLAN:  Shirley Nichols 81 y.o. female with pmh of hypertension and hyperlipidemia was seen for localized pancreatic cancer.  #Localized pancreatic cancer - MRCP on 03/03/2022 showed a 3.8 cm pancreatic head mass and biliary dilatation.  She had ERCP with dilatation of upper and mid third main bile duct with obstructing mass and 1 metal stent was placed.  Brushings were positive for adenocarcinoma. CT chest negative for metastatic disease.  -Patient was evaluated by Dr. Zenia Resides.  The tumor is resectable however she is not a candidate due to poor functional status.  I will consider doing single agent gemcitabine when her functional status improves.  At this time, ECOG status is 3.  She was recently admitted to the hospital for generalized weakness, fatigue and poor p.o. intake.  Since discharge to home, she has been doing better with improved intake.  I discussed with the patient and husband about role of radiation with her localized cancer.  They prefer to wait for another couple of weeks for her to regain more strength and energy.  I will send for foundation 1 testing to look for any benefit of immunotherapy or PARP inhibitors.  -Nutrition and palliative care on board.  # Transaminitis -Differential includes pancreatic cancer or drug-related from phenytoin -Trending down.  Repeat CT abdomen pelvis was stable.  #Anemia -likely form underlying cancer -B12, folate  normal.  Ferritin elevated ~500.  Will add on iron/TIBC.  If saturation is less than 10%, will consider doing IV Feraheme.  #?Seizure -During hospitalization in October 2023, was started on Dilantin and Keppra for possible seizures.  She is following with neurology who has tapered her down of Dilantin.  Orders Placed This Encounter  Procedures   Iron and TIBC([facility])    Standing Status:   Future    Standing Expiration Date:   04/26/2023   RTC in 2 weeks for MD visit, labs, possible Feraheme.  The total time spent in the appointment was 30 minutes encounter with patients including review of chart and various tests results, discussions about plan of care and coordination of care plan   All questions were answered. The patient knows to call the clinic with any problems, questions or concerns. No barriers to learning was detected.  Shirley Canary, MD 11/28/202310:11 AM   HISTORY OF PRESENTING ILLNESS:  Shirley Nichols 81 y.o. female with pmh of hypertension and hyperlipidemia was seen for localized pancreatic cancer.  Patient was sent to ED by PCP in October 2023 for elevated liver enzymes and concern for biliary obstruction. She had MRCP on 03/03/2022 which showed a 3.8 cm pancreatic head mass and biliary dilatation.  She had ERCP with dilatation of upper and mid third main bile duct with obstructing mass and 1 metal stent was placed.  Brushings were positive for adenocarcinoma.  Was complicated by post ERCP pancreatitis.  Her hospital course was prolonged by acute respiratory failure which required intubation.  There was suspicion for seizure and was started on Keppra and Dilantin by  neurology.  MRI brain and LP was negative for malignancy.  CT chest was negative for metastatic disease.  She also had acute renal failure which improved with fluids.  Prior to hospitalization in October, patient was functional and very independent with her ADLs.  She was driving and doing things.  She  has noticed a significant decline in her functional status.  Currently her ECOG is 3.  She spends most of her time lying or sleeping.  Her appetite is fair.  She has lost about 15 pounds in the past few weeks.   Interval history Patient was admitted at [facility] for generalized weakness and fatigue.  No source of infection was found.  Repeat CT abdomen pelvis was stable.  Patient and husband reports that she has been doing little better since discharge from the hospital.  Her appetite has improved.  She has intermittent mild pain in her right knee and right shoulder.  I have reviewed her chart and materials related to her cancer extensively and collaborated history with the patient. Summary of oncologic history is as follows: Oncology History  Pancreatic cancer ([place])  03/03/2022 Initial Diagnosis   Pancreatic cancer ([place])   03/03/2022 Imaging   CT abdomen and pelvis  Hazy stranding about the pancreatic head suggestive of pancreatitis. This is incompletely evaluated without IV contrast. Dilated gallbladder and common bile duct. MRI/MRCP is recommended to evaluate for choledocholithiasis or obstructing mass.   03/03/2022 Imaging   MRI  Image degradation by motion artifact noted.   3.4 cm masslike density in the pancreatic head, highly suspicious for pancreatic carcinoma.    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 No evidence of choledocholithiasis.   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   03/05/2022 Pathology Results   FINAL MICROSCOPIC DIAGNOSIS:  - Malignant cells consistent with adenocarcinoma    03/05/2022 Procedure   ERCP - The major papilla appeared normal. - The upper third of the main bile duct and middle third of the main bile duct were dilated, with a mass causing an obstruction. - A biliary sphincterotomy was performed. - One covered metal stent was placed into the  common bile duct. - Cells for cytology obtained in the lower third of the main duct.   03/09/2022 Cancer Staging   Staging form: Exocrine Pancreas, AJCC 8th Edition - Clinical stage from 03/09/2022: Stage IB (cT2, cN0, cM0) - Signed by Heath Lark, MD on 03/09/2022 Stage prefix: Initial diagnosis Total positive nodes: 0    Hospital Admission   Admit date: 04/17/2022 to 04/22/2022 Admission diagnosis: Generalized weakness and fatigue, poor p.o. intake. Additional comments: Repeat CT abdomen pelvis done for worsening transaminitis showed stable pancreatic mass and metallic stents in place.  No concerns for infection.  Venous Doppler showed right Baker's cyst.  Rehab on discharge declined by family.     MEDICAL HISTORY:  Past Medical History:  Diagnosis Date   Cancer ([place])    skin cancer   Hyperlipidemia    Hypertension     SURGICAL HISTORY: Past Surgical History:  Procedure Laterality Date   ABDOMINAL HYSTERECTOMY     BILIARY BRUSHING  03/05/2022   Procedure: BILIARY BRUSHING;  Surgeon: Carol Ada, MD;  Location: [facility] ENDOSCOPY;  Service: Gastroenterology;;   BILIARY STENT PLACEMENT  03/05/2022   Procedure: BILIARY STENT PLACEMENT;  Surgeon: Carol Ada, MD;  Location: [place];  Service: Gastroenterology;;   COLONOSCOPY  02/21/2002 adenomatous polyp, 03/23/2011   COLONOSCOPY W/ POLYPECTOMY  02/2000   TA polyp removed from ascending colon.  performed through [facility] (? affiliate in [place])   COLONOSCOPY WITH PROPOFOL N/A 11/03/2016   Procedure: COLONOSCOPY WITH PROPOFOL;  Surgeon: Manya Silvas, MD;  Location: [facility] ENDOSCOPY;  Service: Endoscopy;  Laterality: N/A;   ERCP N/A 03/05/2022   Procedure: ENDOSCOPIC RETROGRADE CHOLANGIOPANCREATOGRAPHY (ERCP);  Surgeon: Carol Ada, MD;  Location: [place];  Service: Gastroenterology;  Laterality: N/A;   HEMORROIDECTOMY     TONSILLECTOMY      SOCIAL HISTORY: Social History   Socioeconomic History   Marital  status: Married    Spouse name: Not on file   Number of children: Not on file   Years of education: Not on file   Highest education level: Not on file  Occupational History   Not on file  Tobacco Use   Smoking status: Former   Smokeless tobacco: Never  Substance and Sexual Activity   Alcohol use: No   Drug use: No   Sexual activity: Yes  Other Topics Concern   Not on file  Social History Narrative   Not on file   Social Determinants of Health   Financial Resource Strain: Not on file  Food Insecurity: No Food Insecurity (04/17/2022)   Hunger Vital Sign    Worried About Running Out of Food in the Last Year: Never true    Ran Out of Food in the Last Year: Never true  Transportation Needs: No Transportation Needs (04/17/2022)   PRAPARE - Hydrologist (Medical): No    Lack of Transportation (Non-Medical): No  Physical Activity: Not on file  Stress: Not on file  Social Connections: Not on file  Intimate Partner Violence: Not At Risk (04/17/2022)   Humiliation, Afraid, Rape, and Kick questionnaire    Fear of Current or Ex-Partner: No    Emotionally Abused: No    Physically Abused: No    Sexually Abused: No    FAMILY HISTORY: Family History  Problem Relation Age of Onset   Seizures Sister     ALLERGIES:  has No Known Allergies.  MEDICATIONS:  Current Outpatient Medications  Medication Sig Dispense Refill   acetaminophen (TYLENOL) 325 MG tablet Take 2 tablets (650 mg total) by mouth every 6 (six) hours as needed for mild pain, fever or headache.     aspirin EC 81 MG tablet Take 81 mg by mouth daily.     ezetimibe (ZETIA) 10 MG tablet Take 1 tablet (10 mg total) by mouth every evening. 90 tablet 3   fluvastatin XL (LESCOL XL) 80 MG 24 hr tablet Take 1 tablet (80 mg total) by mouth daily. 90 tablet 3   levETIRAcetam (KEPPRA) 750 MG tablet Take 1 tablet (750 mg total) by mouth 2 (two) times daily. 180 tablet 3   magnesium oxide (MAG-OX) 400 MG  tablet Take 2 tablets (800 mg total) by mouth 2 (two) times daily. 120 tablet 0   ondansetron (ZOFRAN) 4 MG tablet Take 1 tablet (4 mg total) by mouth every 8 (eight) hours as needed for nausea or vomiting. 20 tablet 0   oxyCODONE (OXY IR/ROXICODONE) 5 MG immediate release tablet Take 1/2 tablet (2.5 mg total) by mouth every 8 (eight) hours as needed for moderate pain or severe pain. 10 tablet 0   phenytoin (DILANTIN) 100 MG ER capsule Take 1 capsule (100 mg total) by mouth at bedtime for 7 days. 7 capsule 0  polyethylene glycol (MIRALAX / GLYCOLAX) 17 g packet Take 17 g by mouth daily. 14 each 0   senna-docusate (SENOKOT-S) 8.6-50 MG tablet Take 1 tablet by mouth 2 (two) times daily.     SYMBICORT 160-4.5 MCG/ACT inhaler Inhale 2 puffs into the lungs as needed (for wheezing or shortness of breath). 10.2 g 4   No current facility-administered medications for this visit.    REVIEW OF SYSTEMS:   Pertinent information mentioned in HPI All other systems were reviewed with the patient and are negative.  PHYSICAL EXAMINATION: ECOG PERFORMANCE STATUS: 3 - Symptomatic, >50% confined to bed  There were no vitals filed for this visit.  There were no vitals filed for this visit.   GENERAL:alert, no distress and comfortable SKIN: skin color, texture, turgor are normal, no rashes or significant lesions EYES: normal, conjunctiva are pink and non-injected, sclera clear OROPHARYNX:no exudate, no erythema and lips, buccal mucosa, and tongue normal  NECK: supple, thyroid normal size, non-tender, without nodularity LYMPH:  no palpable lymphadenopathy in the cervical, axillary or inguinal LUNGS: clear to auscultation and percussion with normal breathing effort HEART: regular rate & rhythm and no murmurs and no lower extremity edema ABDOMEN:abdomen soft, non-tender and normal bowel sounds Musculoskeletal:no cyanosis of digits and no clubbing  PSYCH: alert & oriented x 3 with fluent speech NEURO: no  focal motor/sensory deficits  LABORATORY DATA:  I have reviewed the data as listed Lab Results  Component Value Date   WBC 11.3 (H) 04/22/2022   HGB 7.3 (L) 04/22/2022   HCT 22.9 (L) 04/22/2022   MCV 80.9 04/22/2022   PLT 474 (H) 04/22/2022   Recent Labs    03/14/22 1430 03/14/22 1715 04/20/22 0402 04/21/22 0605 04/22/22 0438  NA  --    < > 135 139 136  K  --    < > 3.5 3.3* 3.9  CL  --    < > 99 101 100  CO2  --    < > '25 29 26  '$ GLUCOSE  --    < > 127* 124* 126*  BUN  --    < > 29* 31* 30*  CREATININE  --    < > 0.84 0.71 0.78  CALCIUM  --    < > 8.8* 8.9 8.9  GFRNONAA  --    < > >60 >60 >60  PROT  --    < > 6.0* 6.2* 6.3*  ALBUMIN  --    < > 2.0* 2.1* 2.2*  AST  --    < > 109* 71* 66*  ALT  --    < > 173* 136* 124*  ALKPHOS  --    < > 186* 160* 179*  BILITOT  --    < > 0.6 0.6 0.5  BILIDIR 1.9*  --   --   --   --    < > = values in this interval not displayed.    RADIOGRAPHIC STUDIES: I have personally reviewed the radiological images as listed and agreed with the findings in the report. ECHOCARDIOGRAM COMPLETE  Result Date: 04/18/2022    ECHOCARDIOGRAM REPORT   Patient Name:   Shirley Nichols Date of Exam: 04/18/2022 Medical Rec #:  [id number]        Height:       64.0 in Accession #:    [id number]       Weight:       123.0 lb Date of Birth:  July 19, 1940  BSA:          1.591 m Patient Age:    68 years         BP:           154/70 mmHg Patient Gender: F                HR:           83 bpm. Exam Location:  [facility] Procedure: 2D Echo, Cardiac Doppler and Color Doppler Indications:     CHF-acute diastolic K02.54  History:         Patient has no prior history of Echocardiogram examinations.                  Risk Factors:Hypertension and Dyslipidemia.  Sonographer:     Sherrie Sport Referring Phys:  [id number] Soledad Gerlach NIU Diagnosing Phys: Serafina Royals MD  Sonographer Comments: Image quality was good. IMPRESSIONS  1. Left ventricular ejection fraction, by estimation, is 60 to 65%. The  left ventricle has normal function. The left ventricle has no regional wall motion abnormalities. Left ventricular diastolic parameters were normal.  2. Right ventricular systolic function is normal. The right ventricular size is normal.  3. The mitral valve is normal in structure. Trivial mitral valve regurgitation.  4. The aortic valve is normal in structure. Aortic valve regurgitation is not visualized. FINDINGS  Left Ventricle: Left ventricular ejection fraction, by estimation, is 60 to 65%. The left ventricle has normal function. The left ventricle has no regional wall motion abnormalities. The left ventricular internal cavity size was normal in size. There is  no left ventricular hypertrophy. Left ventricular diastolic parameters were normal. Right Ventricle: The right ventricular size is normal. No increase in right ventricular wall thickness. Right ventricular systolic function is normal. Left Atrium: Left atrial size was normal in size. Right Atrium: Right atrial size was normal in size. Pericardium: There is no evidence of pericardial effusion. Mitral Valve: The mitral valve is normal in structure. Trivial mitral valve regurgitation. Tricuspid Valve: The tricuspid valve is normal in structure. Tricuspid valve regurgitation is trivial. Aortic Valve: The aortic valve is normal in structure. Aortic valve regurgitation is not visualized. Aortic valve mean gradient measures 5.0 mmHg. Aortic valve peak gradient measures 9.5 mmHg. Aortic valve area, by VTI measures 2.83 cm. Pulmonic Valve: The pulmonic valve was normal in structure. Pulmonic valve regurgitation is trivial. Aorta: The aortic root and ascending aorta are structurally normal, with no evidence of dilitation. IAS/Shunts: No atrial level shunt detected by color flow Doppler.  LEFT VENTRICLE PLAX 2D LVIDd:         4.10 cm   Diastology LVIDs:         3.00 cm   LV e' medial:    4.68 cm/s LV PW:         1.10 cm   LV E/e' medial:  19.8 LV IVS:        1.00  cm   LV e' lateral:   7.40 cm/s LVOT diam:     2.10 cm   LV E/e' lateral: 12.5 LV SV:         77 LV SV Index:   48 LVOT Area:     3.46 cm  RIGHT VENTRICLE RV Basal diam:  2.60 cm RV Mid diam:    2.00 cm RV S prime:     13.90 cm/s TAPSE (M-mode): 2.5 cm LEFT ATRIUM             Index  RIGHT ATRIUM           Index LA diam:        3.20 cm 2.01 cm/m   RA Area:     14.50 cm LA Vol (A2C):   43.7 ml 27.46 ml/m  RA Volume:   32.70 ml  20.55 ml/m LA Vol (A4C):   27.1 ml 17.03 ml/m LA Biplane Vol: 37.0 ml 23.25 ml/m  AORTIC VALVE AV Area (Vmax):    2.52 cm AV Area (Vmean):   2.43 cm AV Area (VTI):     2.83 cm AV Vmax:           154.00 cm/s AV Vmean:          102.000 cm/s AV VTI:            0.272 m AV Peak Grad:      9.5 mmHg AV Mean Grad:      5.0 mmHg LVOT Vmax:         112.00 cm/s LVOT Vmean:        71.500 cm/s LVOT VTI:          0.222 m LVOT/AV VTI ratio: 0.82  AORTA Ao Root diam: 3.30 cm MITRAL VALVE                TRICUSPID VALVE MV Area (PHT): 3.77 cm     TR Peak grad:   14.3 mmHg MV Decel Time: 201 msec     TR Vmax:        189.00 cm/s MV E velocity: 92.80 cm/s MV A velocity: 129.00 cm/s  SHUNTS MV E/A ratio:  0.72         Systemic VTI:  0.22 m                             Systemic Diam: 2.10 cm Serafina Royals MD Electronically signed by Serafina Royals MD Signature Date/Time: 04/18/2022/12:49:06 PM    Final    CT ABDOMEN PELVIS W CONTRAST  Result Date: 04/17/2022 CLINICAL DATA:  Recently diagnosed pancreatic cancer. Recent sphincterotomy and biliary stent placement. Progressive weakness. * Tracking Code: BO * EXAM: CT ABDOMEN AND PELVIS WITH CONTRAST TECHNIQUE: Multidetector CT imaging of the abdomen and pelvis was performed using the standard protocol following bolus administration of intravenous contrast. RADIATION DOSE REDUCTION: This exam was performed according to the departmental dose-optimization program which includes automated exposure control, adjustment of the mA and/or kV according to  patient size and/or use of iterative reconstruction technique. CONTRAST:  183m OMNIPAQUE IOHEXOL 300 MG/ML  SOLN COMPARISON:  Abdominopelvic CT 04/05/2022. Abdominal MRI 03/03/2022. FINDINGS: Lower chest: Patchy atelectasis at both lung bases. No pleural effusion or suspicious pulmonary nodule. There is atherosclerosis of the aorta and coronary arteries. Hepatobiliary: Metallic biliary stent remains in place with persistent pneumobilia. The gallbladder is decompressed, without wall thickening or stones. Unchanged appearance of the liver without suspicious abnormality. Pancreas: Known hypodense mass involving the pancreatic head is similar to previous study, measuring approximately 2.1 x 1.3 cm on image 29/3. Stable pancreatic ductal dilatation and mild surrounding inflammation. Spleen: Normal in size without focal abnormality. Adrenals/Urinary Tract: Unchanged 2.2 cm left adrenal nodule which was previously characterized by noncontrast CT as an adenoma. No follow-up imaging recommended. The right adrenal gland appears normal. No evidence of urinary tract calculus, suspicious renal lesion or hydronephrosis. Unchanged bilateral renal cysts; no follow-up imaging recommended. The bladder appears unremarkable for its degree of distention. Stomach/Bowel: No  enteric contrast administered. The stomach appears unremarkable for its degree of distension. No evidence of bowel wall thickening, distention or surrounding inflammatory change. Vascular/Lymphatic: There are no enlarged abdominal or pelvic lymph nodes. Diffuse aortic and branch vessel atherosclerosis without evidence of aneurysm or large vessel occlusion. No gross vascular encasement identified in the porta hepatis. Reproductive: Status post hysterectomy.  No adnexal mass. Other: No ascites, free air or peritoneal nodularity. Previously noted small subcutaneous nodule within the left anterior abdominal wall has slightly decreased in size and may be related to  previous subcutaneous injection. Musculoskeletal: No acute or significant osseous findings. Facet hypertrophy in the lower lumbar spine with a degenerative grade 1 anterolisthesis at L4-5. IMPRESSION: 1. No acute findings or clear explanation for the patient's symptoms. 2. Stable appearance of the known pancreatic head mass with associated pancreatic ductal dilatation and pneumobilia following biliary stenting. No evidence of metastatic disease. 3. Aortic Atherosclerosis (ICD10-I70.0). Electronically Signed   By: Richardean Sale M.D.   On: 04/17/2022 16:43   US Venous Img Lower Unilateral Right  Result Date: 04/17/2022 CLINICAL DATA:  Swelling EXAM: Right LOWER EXTREMITY VENOUS DOPPLER ULTRASOUND TECHNIQUE: Gray-scale sonography with compression, as well as color and duplex ultrasound, were performed to evaluate the deep venous system(s) from the level of the common femoral vein through the popliteal and proximal calf veins. COMPARISON:  None Available. FINDINGS: VENOUS Normal compressibility of the common femoral, superficial femoral, and popliteal veins, as well as the visualized calf veins. Visualized portions of profunda femoral vein and great saphenous vein unremarkable. No filling defects to suggest DVT on grayscale or color Doppler imaging. Doppler waveforms show normal direction of venous flow, normal respiratory plasticity and response to augmentation. OTHER In the popliteal fossa there is a 4.5 x 1.4 x 2.8 cm heterogenous oval fluid collection. There is no flow within or around this collection. This is incompletely assessed on the provided images. Limitations: none IMPRESSION: 1. No evidence of right lower extremity DVT. 2. In the popliteal fossa there is a 4.5 x 1.4 x 2.8 cm heterogenous oval fluid collection, which could represent Baker's cyst with hemorrhage or synovitis. Electronically Signed   By: Marin Roberts M.D.   On: 04/17/2022 10:26   DG Chest Portable 1 View  Result Date:  04/17/2022 CLINICAL DATA:  Weakness and fatigue in a female at age 87. EXAM: PORTABLE CHEST 1 VIEW COMPARISON:  March 14, 2022. FINDINGS: EKG leads project over the chest. Cardiomediastinal contours and hilar structures with persistent cardiac enlargement. Increased interstitial markings improved compared to most recent comparison imaging. No sign of lobar consolidative process. No pneumothorax. On limited assessment no acute skeletal findings. IMPRESSION: Cardiomegaly with improved interstitial markings compared to most recent comparison imaging. No sign of lobar consolidative process. Difficult to exclude mild interstitial edema, difficult to differentiate from background chronic changes. Electronically Signed   By: Zetta Bills M.D.   On: 04/17/2022 09:34   CT ABD PELVIS W/WO CM ONCOLOGY PANCREATIC PROTOCOL  Result Date: 04/07/2022 CLINICAL DATA:  Pancreatic head adenocarcinoma diagnosed on 03/05/2022 ERCP. Staging. * Tracking Code: BO * EXAM: CT ABDOMEN AND PELVIS WITHOUT AND WITH CONTRAST TECHNIQUE: Multidetector CT imaging of the abdomen and pelvis was performed following the standard protocol before and following the bolus administration of intravenous contrast. RADIATION DOSE REDUCTION: This exam was performed according to the departmental dose-optimization program which includes automated exposure control, adjustment of the mA and/or kV according to patient size and/or use of iterative reconstruction technique. CONTRAST:  85m  OMNIPAQUE IOHEXOL 300 MG/ML  SOLN COMPARISON:  03/03/2022 MRI abdomen and unenhanced CT abdomen/pelvis FINDINGS: Lower chest: No significant pulmonary nodules or acute consolidative airspace disease. Coronary atherosclerosis. Hepatobiliary: Normal liver size. Subcapsular hypodense 0.8 cm far inferior right liver lesion (series 9/image 57), too small to characterize, stable from 03/03/2022 MRI abdomen study, where it was T2 hyperintense and compatible with a tiny benign cyst.  No additional liver lesions. Metallic CBD stent in place extending from the mid CBD to the descending duodenal lumen. Pneumobilia throughout the intrahepatic bile ducts and nondistended gallbladder indicating stent patency. Proximal CBD diameter 7 mm. Mild central intrahepatic biliary ductal dilatation. Pancreas: Poorly defined hypoenhancing 2.9 x 2.4 cm pancreatic head mass (series 9/image 47), previously 2.9 x 2.3 cm on 03/03/2022 MRI using similar measurement technique, not appreciably changed. No clear tumor involvement of the SMV, portal veins or SMA. Dilated main pancreatic duct (6 mm diameter) with abrupt caliber transition at the level of the pancreatic head. Diffuse haziness of the peripancreatic fat without measurable peripancreatic fluid collections. No evidence of pancreatic parenchymal necrosis. Tiny 1.2 x 0.6 cm pancreatic body pseudocyst (series 9/image 44), slightly increased from 0.8 x 0.4 cm on 03/03/2022 MRI. Spleen: Normal size. No mass. Adrenals/Urinary Tract: Left adrenal 2.2 x 1.8 cm nodule with precontrast density 4 HU, compatible with an adenoma. Mildly irregular right adrenal gland is unchanged, without discrete right adrenal nodules. No hydronephrosis. Small simple bilateral renal cysts, largest 1.4 cm in the anterior upper right kidney. No suspicious renal masses. Normal bladder. Stomach/Bowel: Small hiatal hernia. Otherwise normal nondistended stomach. Normal caliber small bowel with no small bowel wall thickening. Appendix not discretely visualized. Normal large bowel with no diverticulosis, large bowel wall thickening or pericolonic fat stranding. Vascular/Lymphatic: Atherosclerotic nonaneurysmal abdominal aorta. Patent portal, splenic, hepatic and renal veins. No pathologically enlarged lymph nodes in the abdomen or pelvis. Reproductive: Status post hysterectomy, with no abnormal findings at the vaginal cuff. No adnexal mass. Other: No pneumoperitoneum, ascites or focal fluid  collection. Nonspecific subcutaneous 0.8 cm ventral left abdominal wall soft tissue density nodule (series 9/image 85), new since 03/03/2022 CT. Musculoskeletal: No aggressive appearing focal osseous lesions. Mild thoracolumbar spondylosis. IMPRESSION: 1. Poorly defined hypoenhancing 2.9 cm pancreatic head mass, not appreciably changed since 03/03/2022 MRI, compatible with known primary pancreatic adenocarcinoma. No evidence of vascular invasion by the pancreatic tumor. 2. Dilated main pancreatic duct with abrupt caliber transition at the level of the pancreatic head. Well-positioned CBD stent with pneumobilia indicating stent patency. 3. No lymphadenopathy or other findings highly suspicious for metastatic disease in the abdomen or pelvis. Nonspecific subcutaneous 0.8 cm ventral left abdominal wall soft tissue density nodule, new since 03/03/2022 CT, most commonly due to subcutaneous injections. 4. Evidence of ongoing pancreatitis. Tiny 1.2 x 0.6 cm pancreatic body pseudocyst, slightly increased. 5. 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 6. Small hiatal hernia. 7. Coronary atherosclerosis. 8.  Aortic Atherosclerosis (ICD10-I70.0). Electronically Signed   By: Ilona Sorrel M.D.   On: 04/07/2022 16:08

## 2022-04-25 NOTE — Progress Notes (Signed)
[place] at [facility] [phone] [phone]  Patient Care Team: Remi Haggard, FNP as PCP - General (Family Medicine) Clent Jacks, RN as Oncology Nurse Navigator   NAME OF PATIENT: Shirley Nichols  [id number]  12-04-1940   DATE OF VISIT: 04/25/22  REASON FOR CONSULT: CATHY CROUNSE is a 81 y.o. female with multiple medical problems including including newly diagnosed pancreatic cancer.  Patient was hospitalized 03/03/2022 to 03/31/2022 for evaluation of elevated LFTs. MRCP revealed a localized mass in the head of the pancreas with CBD stricture and diffuse biliary ductal dilation.  Patient was transferred to [facility] and underwent ERCP on 03/05/2022 with sphincterotomy and biliary stent placement.  Brush biopsy positive for pancreatic adenocarcinoma.  Hospitalization was also complicated by metabolic encephalopathy and questionable seizures requiring intubation.  Patient has been evaluated by hepatobiliary surgeon but she is not felt to be a surgical candidate.  Patient was hospitalized 04/17/2022 to 04/22/2022 with progressive weakness and ultimately improved with supportive care.  Palliative care was consulted to help address goals.   SOCIAL HISTORY:     reports that she has quit smoking. She has never used smokeless tobacco. She reports that she does not drink alcohol and does not use drugs.   Patient is married lives at home with her husband.   ADVANCE DIRECTIVES:  None on file   CODE STATUS: DNR  PAST MEDICAL HISTORY: Past Medical History:  Diagnosis Date   Cancer ([place])    skin cancer   Hyperlipidemia    Hypertension     PAST SURGICAL HISTORY:  Past Surgical History:  Procedure Laterality Date   ABDOMINAL HYSTERECTOMY     BILIARY BRUSHING  03/05/2022   Procedure: BILIARY BRUSHING;  Surgeon: Carol Ada, MD;  Location: [facility] ENDOSCOPY;  Service: Gastroenterology;;   BILIARY STENT PLACEMENT   03/05/2022   Procedure: BILIARY STENT PLACEMENT;  Surgeon: Carol Ada, MD;  Location: [facility] ENDOSCOPY;  Service: Gastroenterology;;   COLONOSCOPY     02/21/2002 adenomatous polyp, 03/23/2011   COLONOSCOPY W/ POLYPECTOMY  02/2000   TA polyp removed from ascending colon.  performed through [facility] (? affiliate in [place])   COLONOSCOPY WITH PROPOFOL N/A 11/03/2016   Procedure: COLONOSCOPY WITH PROPOFOL;  Surgeon: Manya Silvas, MD;  Location: [facility] ENDOSCOPY;  Service: Endoscopy;  Laterality: N/A;   ERCP N/A 03/05/2022   Procedure: ENDOSCOPIC RETROGRADE CHOLANGIOPANCREATOGRAPHY (ERCP);  Surgeon: Carol Ada, MD;  Location: [place];  Service: Gastroenterology;  Laterality: N/A;   HEMORROIDECTOMY     TONSILLECTOMY      HEMATOLOGY/ONCOLOGY HISTORY:  Oncology History  Pancreatic cancer ([place])  03/03/2022 Initial Diagnosis   Pancreatic cancer ([place])   03/03/2022 Imaging   CT abdomen and pelvis  Hazy stranding about the pancreatic head suggestive of pancreatitis. This is incompletely evaluated without IV contrast. Dilated gallbladder and common bile duct. MRI/MRCP is recommended to evaluate for choledocholithiasis or obstructing mass.   03/03/2022 Imaging   MRI  Image degradation by motion artifact noted.   3.4 cm masslike density in the pancreatic head, highly suspicious for pancreatic carcinoma.    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 No evidence of choledocholithiasis.   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   03/05/2022 Pathology Results   FINAL MICROSCOPIC DIAGNOSIS:  - Malignant cells consistent with adenocarcinoma    03/05/2022 Procedure   ERCP - The major papilla  appeared normal. - The upper third of the main bile duct and middle third of the main bile duct were dilated, with a mass causing an obstruction. - A biliary sphincterotomy was performed. - One  covered metal stent was placed into the common bile duct. - Cells for cytology obtained in the lower third of the main duct.   03/09/2022 Cancer Staging   Staging form: Exocrine Pancreas, AJCC 8th Edition - Clinical stage from 03/09/2022: Stage IB (cT2, cN0, cM0) - Signed by Heath Lark, MD on 03/09/2022 Stage prefix: Initial diagnosis Total positive nodes: 0    Hospital Admission   Admit date: 04/17/2022 to 04/22/2022 Admission diagnosis: Generalized weakness and fatigue, poor p.o. intake. Additional comments: Repeat CT abdomen pelvis done for worsening transaminitis showed stable pancreatic mass and metallic stents in place.  No concerns for infection.  Venous Doppler showed right Baker's cyst.  Rehab on discharge declined by family.     ALLERGIES:  has No Known Allergies.  MEDICATIONS:  Current Outpatient Medications  Medication Sig Dispense Refill   acetaminophen (TYLENOL) 325 MG tablet Take 2 tablets (650 mg total) by mouth every 6 (six) hours as needed for mild pain, fever or headache.     aspirin EC 81 MG tablet Take 81 mg by mouth daily.     ezetimibe (ZETIA) 10 MG tablet Take 1 tablet (10 mg total) by mouth every evening. 90 tablet 3   levETIRAcetam (KEPPRA) 750 MG tablet Take 1 tablet (750 mg total) by mouth 2 (two) times daily. 180 tablet 3   magnesium oxide (MAG-OX) 400 MG tablet Take 2 tablets (800 mg total) by mouth 2 (two) times daily. 120 tablet 0   ondansetron (ZOFRAN) 4 MG tablet Take 1 tablet (4 mg total) by mouth every 8 (eight) hours as needed for nausea or vomiting. 20 tablet 0   oxyCODONE (OXY IR/ROXICODONE) 5 MG immediate release tablet Take 1/2 tablet (2.5 mg total) by mouth every 8 (eight) hours as needed for moderate pain or severe pain. 10 tablet 0   phenytoin (DILANTIN) 100 MG ER capsule Take 1 capsule (100 mg total) by mouth at bedtime for 7 days. 7 capsule 0   polyethylene glycol (MIRALAX / GLYCOLAX) 17 g packet Take 17 g by mouth daily. 14 each 0    senna-docusate (SENOKOT-S) 8.6-50 MG tablet Take 1 tablet by mouth 2 (two) times daily.     SYMBICORT 160-4.5 MCG/ACT inhaler Inhale 2 puffs into the lungs as needed (for wheezing or shortness of breath). 10.2 g 4   No current facility-administered medications for this visit.    VITAL SIGNS: There were no vitals taken for this visit. There were no vitals filed for this visit.  Estimated body mass index is 20.56 kg/m as calculated from the following:   Height as of 04/17/22: _0  (1.626 m).   Weight as of an earlier encounter on 04/25/22: 119 lb 12.8 oz (54.3 kg).  LABS: CBC:    Component Value Date/Time   WBC 10.3 04/25/2022 1000   HGB 7.8 (L) 04/25/2022 1000   HCT 24.8 (L) 04/25/2022 1000   PLT 531 (H) 04/25/2022 1000   MCV 82.9 04/25/2022 1000   NEUTROABS 7.8 (H) 04/25/2022 1000   LYMPHSABS 1.8 04/25/2022 1000   MONOABS 0.5 04/25/2022 1000   EOSABS 0.1 04/25/2022 1000   BASOSABS 0.1 04/25/2022 1000   Comprehensive Metabolic Panel:    Component Value Date/Time   NA 132 (L) 04/25/2022 1000   K 3.6 04/25/2022 1000  CL 97 (L) 04/25/2022 1000   CO2 26 04/25/2022 1000   BUN 22 04/25/2022 1000   CREATININE 0.77 04/25/2022 1000   GLUCOSE 164 (H) 04/25/2022 1000   CALCIUM 8.4 (L) 04/25/2022 1000   AST 40 04/25/2022 1000   ALT 90 (H) 04/25/2022 1000   ALKPHOS 176 (H) 04/25/2022 1000   BILITOT 0.4 04/25/2022 1000   PROT 7.2 04/25/2022 1000   ALBUMIN 2.6 (L) 04/25/2022 1000    RADIOGRAPHIC STUDIES: ECHOCARDIOGRAM COMPLETE  Result Date: 04/18/2022    ECHOCARDIOGRAM REPORT   Patient Name:   Shirley Nichols Date of Exam: 04/18/2022 Medical Rec #:  [id number]        Height:       64.0 in Accession #:    [id number]       Weight:       123.0 lb Date of Birth:  Jun 23, 1940         BSA:          1.591 m Patient Age:    30 years         BP:           154/70 mmHg Patient Gender: F                HR:           83 bpm. Exam Location:  [facility] Procedure: 2D Echo, Cardiac Doppler and Color  Doppler Indications:     CHF-acute diastolic T01.60  History:         Patient has no prior history of Echocardiogram examinations.                  Risk Factors:Hypertension and Dyslipidemia.  Sonographer:     Sherrie Sport Referring Phys:  [id number] Soledad Gerlach NIU Diagnosing Phys: Serafina Royals MD  Sonographer Comments: Image quality was good. IMPRESSIONS  1. Left ventricular ejection fraction, by estimation, is 60 to 65%. The left ventricle has normal function. The left ventricle has no regional wall motion abnormalities. Left ventricular diastolic parameters were normal.  2. Right ventricular systolic function is normal. The right ventricular size is normal.  3. The mitral valve is normal in structure. Trivial mitral valve regurgitation.  4. The aortic valve is normal in structure. Aortic valve regurgitation is not visualized. FINDINGS  Left Ventricle: Left ventricular ejection fraction, by estimation, is 60 to 65%. The left ventricle has normal function. The left ventricle has no regional wall motion abnormalities. The left ventricular internal cavity size was normal in size. There is  no left ventricular hypertrophy. Left ventricular diastolic parameters were normal. Right Ventricle: The right ventricular size is normal. No increase in right ventricular wall thickness. Right ventricular systolic function is normal. Left Atrium: Left atrial size was normal in size. Right Atrium: Right atrial size was normal in size. Pericardium: There is no evidence of pericardial effusion. Mitral Valve: The mitral valve is normal in structure. Trivial mitral valve regurgitation. Tricuspid Valve: The tricuspid valve is normal in structure. Tricuspid valve regurgitation is trivial. Aortic Valve: The aortic valve is normal in structure. Aortic valve regurgitation is not visualized. Aortic valve mean gradient measures 5.0 mmHg. Aortic valve peak gradient measures 9.5 mmHg. Aortic valve area, by VTI measures 2.83 cm. Pulmonic Valve: The  pulmonic valve was normal in structure. Pulmonic valve regurgitation is trivial. Aorta: The aortic root and ascending aorta are structurally normal, with no evidence of dilitation. IAS/Shunts: No atrial level shunt detected by color flow Doppler.  LEFT VENTRICLE PLAX  2D LVIDd:         4.10 cm   Diastology LVIDs:         3.00 cm   LV e' medial:    4.68 cm/s LV PW:         1.10 cm   LV E/e' medial:  19.8 LV IVS:        1.00 cm   LV e' lateral:   7.40 cm/s LVOT diam:     2.10 cm   LV E/e' lateral: 12.5 LV SV:         77 LV SV Index:   48 LVOT Area:     3.46 cm  RIGHT VENTRICLE RV Basal diam:  2.60 cm RV Mid diam:    2.00 cm RV S prime:     13.90 cm/s TAPSE (M-mode): 2.5 cm LEFT ATRIUM             Index        RIGHT ATRIUM           Index LA diam:        3.20 cm 2.01 cm/m   RA Area:     14.50 cm LA Vol (A2C):   43.7 ml 27.46 ml/m  RA Volume:   32.70 ml  20.55 ml/m LA Vol (A4C):   27.1 ml 17.03 ml/m LA Biplane Vol: 37.0 ml 23.25 ml/m  AORTIC VALVE AV Area (Vmax):    2.52 cm AV Area (Vmean):   2.43 cm AV Area (VTI):     2.83 cm AV Vmax:           154.00 cm/s AV Vmean:          102.000 cm/s AV VTI:            0.272 m AV Peak Grad:      9.5 mmHg AV Mean Grad:      5.0 mmHg LVOT Vmax:         112.00 cm/s LVOT Vmean:        71.500 cm/s LVOT VTI:          0.222 m LVOT/AV VTI ratio: 0.82  AORTA Ao Root diam: 3.30 cm MITRAL VALVE                TRICUSPID VALVE MV Area (PHT): 3.77 cm     TR Peak grad:   14.3 mmHg MV Decel Time: 201 msec     TR Vmax:        189.00 cm/s MV E velocity: 92.80 cm/s MV A velocity: 129.00 cm/s  SHUNTS MV E/A ratio:  0.72         Systemic VTI:  0.22 m                             Systemic Diam: 2.10 cm Serafina Royals MD Electronically signed by Serafina Royals MD Signature Date/Time: 04/18/2022/12:49:06 PM    Final    CT ABDOMEN PELVIS W CONTRAST  Result Date: 04/17/2022 CLINICAL DATA:  Recently diagnosed pancreatic cancer. Recent sphincterotomy and biliary stent placement. Progressive  weakness. * Tracking Code: BO * EXAM: CT ABDOMEN AND PELVIS WITH CONTRAST TECHNIQUE: Multidetector CT imaging of the abdomen and pelvis was performed using the standard protocol following bolus administration of intravenous contrast. RADIATION DOSE REDUCTION: This exam was performed according to the departmental dose-optimization program which includes automated exposure control, adjustment of the mA and/or kV according to patient size and/or use of iterative  reconstruction technique. CONTRAST:  133m OMNIPAQUE IOHEXOL 300 MG/ML  SOLN COMPARISON:  Abdominopelvic CT 04/05/2022. Abdominal MRI 03/03/2022. FINDINGS: Lower chest: Patchy atelectasis at both lung bases. No pleural effusion or suspicious pulmonary nodule. There is atherosclerosis of the aorta and coronary arteries. Hepatobiliary: Metallic biliary stent remains in place with persistent pneumobilia. The gallbladder is decompressed, without wall thickening or stones. Unchanged appearance of the liver without suspicious abnormality. Pancreas: Known hypodense mass involving the pancreatic head is similar to previous study, measuring approximately 2.1 x 1.3 cm on image 29/3. Stable pancreatic ductal dilatation and mild surrounding inflammation. Spleen: Normal in size without focal abnormality. Adrenals/Urinary Tract: Unchanged 2.2 cm left adrenal nodule which was previously characterized by noncontrast CT as an adenoma. No follow-up imaging recommended. The right adrenal gland appears normal. No evidence of urinary tract calculus, suspicious renal lesion or hydronephrosis. Unchanged bilateral renal cysts; no follow-up imaging recommended. The bladder appears unremarkable for its degree of distention. Stomach/Bowel: No enteric contrast administered. The stomach appears unremarkable for its degree of distension. No evidence of bowel wall thickening, distention or surrounding inflammatory change. Vascular/Lymphatic: There are no enlarged abdominal or pelvic lymph  nodes. Diffuse aortic and branch vessel atherosclerosis without evidence of aneurysm or large vessel occlusion. No gross vascular encasement identified in the porta hepatis. Reproductive: Status post hysterectomy.  No adnexal mass. Other: No ascites, free air or peritoneal nodularity. Previously noted small subcutaneous nodule within the left anterior abdominal wall has slightly decreased in size and may be related to previous subcutaneous injection. Musculoskeletal: No acute or significant osseous findings. Facet hypertrophy in the lower lumbar spine with a degenerative grade 1 anterolisthesis at L4-5. IMPRESSION: 1. No acute findings or clear explanation for the patient's symptoms. 2. Stable appearance of the known pancreatic head mass with associated pancreatic ductal dilatation and pneumobilia following biliary stenting. No evidence of metastatic disease. 3. Aortic Atherosclerosis (ICD10-I70.0). Electronically Signed   By: WRichardean SaleM.D.   On: 04/17/2022 16:43   UKoreaVenous Img Lower Unilateral Right  Result Date: 04/17/2022 CLINICAL DATA:  Swelling EXAM: Right LOWER EXTREMITY VENOUS DOPPLER ULTRASOUND TECHNIQUE: Gray-scale sonography with compression, as well as color and duplex ultrasound, were performed to evaluate the deep venous system(s) from the level of the common femoral vein through the popliteal and proximal calf veins. COMPARISON:  None Available. FINDINGS: VENOUS Normal compressibility of the common femoral, superficial femoral, and popliteal veins, as well as the visualized calf veins. Visualized portions of profunda femoral vein and great saphenous vein unremarkable. No filling defects to suggest DVT on grayscale or color Doppler imaging. Doppler waveforms show normal direction of venous flow, normal respiratory plasticity and response to augmentation. OTHER In the popliteal fossa there is a 4.5 x 1.4 x 2.8 cm heterogenous oval fluid collection. There is no flow within or around this  collection. This is incompletely assessed on the provided images. Limitations: none IMPRESSION: 1. No evidence of right lower extremity DVT. 2. In the popliteal fossa there is a 4.5 x 1.4 x 2.8 cm heterogenous oval fluid collection, which could represent Baker's cyst with hemorrhage or synovitis. Electronically Signed   By: HMarin RobertsM.D.   On: 04/17/2022 10:26   DG Chest Portable 1 View  Result Date: 04/17/2022 CLINICAL DATA:  Weakness and fatigue in a female at age [age] EXAM: PORTABLE CHEST 1 VIEW COMPARISON:  March 14, 2022. FINDINGS: EKG leads project over the chest. Cardiomediastinal contours and hilar structures with persistent cardiac enlargement. Increased interstitial markings improved  compared to most recent comparison imaging. No sign of lobar consolidative process. No pneumothorax. On limited assessment no acute skeletal findings. IMPRESSION: Cardiomegaly with improved interstitial markings compared to most recent comparison imaging. No sign of lobar consolidative process. Difficult to exclude mild interstitial edema, difficult to differentiate from background chronic changes. Electronically Signed   By: Zetta Bills M.D.   On: 04/17/2022 09:34   CT ABD PELVIS W/WO CM ONCOLOGY PANCREATIC PROTOCOL  Result Date: 04/07/2022 CLINICAL DATA:  Pancreatic head adenocarcinoma diagnosed on 03/05/2022 ERCP. Staging. * Tracking Code: BO * EXAM: CT ABDOMEN AND PELVIS WITHOUT AND WITH CONTRAST TECHNIQUE: Multidetector CT imaging of the abdomen and pelvis was performed following the standard protocol before and following the bolus administration of intravenous contrast. RADIATION DOSE REDUCTION: This exam was performed according to the departmental dose-optimization program which includes automated exposure control, adjustment of the mA and/or kV according to patient size and/or use of iterative reconstruction technique. CONTRAST:  55m OMNIPAQUE IOHEXOL 300 MG/ML  SOLN COMPARISON:  03/03/2022 MRI  abdomen and unenhanced CT abdomen/pelvis FINDINGS: Lower chest: No significant pulmonary nodules or acute consolidative airspace disease. Coronary atherosclerosis. Hepatobiliary: Normal liver size. Subcapsular hypodense 0.8 cm far inferior right liver lesion (series 9/image 57), too small to characterize, stable from 03/03/2022 MRI abdomen study, where it was T2 hyperintense and compatible with a tiny benign cyst. No additional liver lesions. Metallic CBD stent in place extending from the mid CBD to the descending duodenal lumen. Pneumobilia throughout the intrahepatic bile ducts and nondistended gallbladder indicating stent patency. Proximal CBD diameter 7 mm. Mild central intrahepatic biliary ductal dilatation. Pancreas: Poorly defined hypoenhancing 2.9 x 2.4 cm pancreatic head mass (series 9/image 47), previously 2.9 x 2.3 cm on 03/03/2022 MRI using similar measurement technique, not appreciably changed. No clear tumor involvement of the SMV, portal veins or SMA. Dilated main pancreatic duct (6 mm diameter) with abrupt caliber transition at the level of the pancreatic head. Diffuse haziness of the peripancreatic fat without measurable peripancreatic fluid collections. No evidence of pancreatic parenchymal necrosis. Tiny 1.2 x 0.6 cm pancreatic body pseudocyst (series 9/image 44), slightly increased from 0.8 x 0.4 cm on 03/03/2022 MRI. Spleen: Normal size. No mass. Adrenals/Urinary Tract: Left adrenal 2.2 x 1.8 cm nodule with precontrast density 4 HU, compatible with an adenoma. Mildly irregular right adrenal gland is unchanged, without discrete right adrenal nodules. No hydronephrosis. Small simple bilateral renal cysts, largest 1.4 cm in the anterior upper right kidney. No suspicious renal masses. Normal bladder. Stomach/Bowel: Small hiatal hernia. Otherwise normal nondistended stomach. Normal caliber small bowel with no small bowel wall thickening. Appendix not discretely visualized. Normal large bowel with no  diverticulosis, large bowel wall thickening or pericolonic fat stranding. Vascular/Lymphatic: Atherosclerotic nonaneurysmal abdominal aorta. Patent portal, splenic, hepatic and renal veins. No pathologically enlarged lymph nodes in the abdomen or pelvis. Reproductive: Status post hysterectomy, with no abnormal findings at the vaginal cuff. No adnexal mass. Other: No pneumoperitoneum, ascites or focal fluid collection. Nonspecific subcutaneous 0.8 cm ventral left abdominal wall soft tissue density nodule (series 9/image 85), new since 03/03/2022 CT. Musculoskeletal: No aggressive appearing focal osseous lesions. Mild thoracolumbar spondylosis. IMPRESSION: 1. Poorly defined hypoenhancing 2.9 cm pancreatic head mass, not appreciably changed since 03/03/2022 MRI, compatible with known primary pancreatic adenocarcinoma. No evidence of vascular invasion by the pancreatic tumor. 2. Dilated main pancreatic duct with abrupt caliber transition at the level of the pancreatic head. Well-positioned CBD stent with pneumobilia indicating stent patency. 3. No lymphadenopathy or other  findings highly suspicious for metastatic disease in the abdomen or pelvis. Nonspecific subcutaneous 0.8 cm ventral left abdominal wall soft tissue density nodule, new since 03/03/2022 CT, most commonly due to subcutaneous injections. 4. Evidence of ongoing pancreatitis. Tiny 1.2 x 0.6 cm pancreatic body pseudocyst, slightly increased. 5. 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 6. Small hiatal hernia. 7. Coronary atherosclerosis. 8.  Aortic Atherosclerosis (ICD10-I70.0). Electronically Signed   By: Ilona Sorrel M.D.   On: 04/07/2022 16:08    PERFORMANCE STATUS (ECOG) : 3 - Symptomatic, >50% confined to bed  Review of Systems Unless otherwise noted, a complete review of systems is negative.  Physical Exam General: NAD Cardiovascular: regular rate and rhythm Pulmonary: clear ant fields Abdomen: soft, nontender, +  bowel sounds GU: no suprapubic tenderness Extremities: no edema, no joint deformities Skin: no rashes Neurological: Weakness but otherwise nonfocal  IMPRESSION: Patient returns today for post hospital follow-up.  She has been working at home with physical therapy and has been reports that she is slowly improving with regard to performance status.  Patient is now ambulatory with use of a walker to the bathroom without assistance.  Husband feels like patient is doing much better since discharging home.  Both patient and husband remain interested in pursuing cancer treatment if any options are available.  Patient met with Dr. Darrall Dears today with plan for 2-week follow-up.  Patient denies any distressing symptoms at present.  Reportedly, oral intake has also improved.  I introduced idea of having patient followed at home by community palliative care.  Husband wanted to wait for now given home health involvement.  PLAN: -Continue current scope of treatment -Follow-up telephone visit 3 to 4 weeks   Patient expressed understanding and was in agreement with this plan. She also understands that She can call clinic at any time with any questions, concerns, or complaints.   Thank you for allowing me to participate in the care of this very pleasant patient.   Time Total: 15 minutes  Visit consisted of counseling and education dealing with the complex and emotionally intense issues of symptom management in the setting of serious illness.Greater than 50%  of this time was spent counseling and coordinating care related to the above assessment and plan.  Signed by: Altha Harm, PhD, NP-C

## 2022-04-25 NOTE — Progress Notes (Signed)
Patient here today for follow up for pancreatic cancer. Patient discharged from hospital on 11/25. Patient and husband report appetite is improved. Patient denies pain today.

## 2022-04-27 NOTE — Addendum Note (Signed)
Addended byJane Canary on: 04/27/2022 09:38 AM   Modules accepted: Orders

## 2022-04-28 ENCOUNTER — Telehealth: Payer: Self-pay | Admitting: *Deleted

## 2022-04-28 NOTE — Telephone Encounter (Signed)
Patient's husband called regarding upcoming appointment with Shirley Nichols Dupr. He would like to speak with Shirley Nichols about the appointment.

## 2022-05-02 ENCOUNTER — Inpatient Hospital Stay: Payer: PPO | Attending: Internal Medicine

## 2022-05-02 DIAGNOSIS — Z79899 Other long term (current) drug therapy: Secondary | ICD-10-CM | POA: Insufficient documentation

## 2022-05-02 DIAGNOSIS — D649 Anemia, unspecified: Secondary | ICD-10-CM | POA: Insufficient documentation

## 2022-05-02 DIAGNOSIS — C250 Malignant neoplasm of head of pancreas: Secondary | ICD-10-CM | POA: Insufficient documentation

## 2022-05-02 DIAGNOSIS — E876 Hypokalemia: Secondary | ICD-10-CM | POA: Insufficient documentation

## 2022-05-02 NOTE — Progress Notes (Signed)
Nutrition Assessment   Reason for Assessment:   Patient with weight loss, poor appetite   ASSESSMENT: 81 year old female with newly diagnosed pancreatic cancer.  Noted hospitalized on 10/6-11/3/23 for elevated LFTs.  Received ERCP on 03/05/22 with sphincterotomy and bilary stent placement.  Hospitalization complicated by metabolic encephalopathy and questionable seizure requiring intubation.  Hospitalized again 11/20-11/25 with progressive weakness.  Followed by Palliative care.   Spoke with husband and patient via phone.  Husband has been preparing meals and patient eating 4 times per day smaller amounts.  Appetite has increased since initial hospitalization (eating bites prior). Breakfast is usually eggs with cheese, toast and bacon or cereal.  Mid morning will have peanut butter crackers. Lunch is sandwich ([place], ham, cheese or turkey/chicken salad or soup).  PM snack is fruit or peanut butter crackers.  Dinner has been roast chicken, greens and green beans or lasagna or pot roast with vegetables.  Drinking ensure shake 1 a day every other day as appetite has improved.  Denies nausea or pain.  Having 1 bowel movement daily, typically.  Sometimes in the evening will have a cookie or ice cream sandwich or sherbet.    Medications: zofran, dilantin, senokot, mirialax   Labs: Na 132, glucose 163   Anthropometrics:   Height: 64 inches Weight: 119 lb 12.8 oz on 11/28 131 lb 13.4 oz on 10/26 145 lb on 03/03/22 BMI: 20  18% weight loss in the last 2 months, significant   Estimated Energy Needs  Kcals: 1350-1600 Protein: 68-80 g Fluid: 1350-1600 ml    NUTRITION DIAGNOSIS: Inadequate oral intake related to cancer and recent illness as evidenced by 18% weight loss in the last 2 months and decreased appetite    INTERVENTION:  Encouraged continuing small frequent meals Discussed ways to add calories and protein in diet.  Handout will be provided to patient tomorrow at rehab  visit Continue oral nutrition supplements for added nutrition Husband with questions on foods to increase iron level.  Handout will be provided Contact information provided    MONITORING, EVALUATION, GOAL: weight trends, intake   Next Visit: Wednesday, Dec 27 phone call  Norie Latendresse B. Zenia Resides, [place], [place] Registered Dietitian [phone]

## 2022-05-03 ENCOUNTER — Inpatient Hospital Stay: Payer: PPO | Admitting: Occupational Therapy

## 2022-05-09 ENCOUNTER — Other Ambulatory Visit: Payer: PPO

## 2022-05-09 ENCOUNTER — Ambulatory Visit: Payer: PPO

## 2022-05-09 ENCOUNTER — Ambulatory Visit: Payer: PPO | Admitting: Internal Medicine

## 2022-05-11 ENCOUNTER — Inpatient Hospital Stay: Payer: PPO

## 2022-05-11 ENCOUNTER — Other Ambulatory Visit: Payer: Self-pay

## 2022-05-11 ENCOUNTER — Encounter: Payer: Self-pay | Admitting: Internal Medicine

## 2022-05-11 ENCOUNTER — Inpatient Hospital Stay (HOSPITAL_BASED_OUTPATIENT_CLINIC_OR_DEPARTMENT_OTHER): Payer: PPO | Admitting: Internal Medicine

## 2022-05-11 ENCOUNTER — Telehealth: Payer: Self-pay | Admitting: *Deleted

## 2022-05-11 VITALS — BP 134/56 | HR 70 | Temp 98.6°F | Resp 20 | Wt 121.5 lb

## 2022-05-11 DIAGNOSIS — C250 Malignant neoplasm of head of pancreas: Secondary | ICD-10-CM

## 2022-05-11 DIAGNOSIS — Z79899 Other long term (current) drug therapy: Secondary | ICD-10-CM | POA: Diagnosis not present

## 2022-05-11 DIAGNOSIS — E876 Hypokalemia: Secondary | ICD-10-CM

## 2022-05-11 DIAGNOSIS — R7989 Other specified abnormal findings of blood chemistry: Secondary | ICD-10-CM | POA: Diagnosis not present

## 2022-05-11 DIAGNOSIS — D649 Anemia, unspecified: Secondary | ICD-10-CM

## 2022-05-11 DIAGNOSIS — D638 Anemia in other chronic diseases classified elsewhere: Secondary | ICD-10-CM

## 2022-05-11 DIAGNOSIS — D509 Iron deficiency anemia, unspecified: Secondary | ICD-10-CM

## 2022-05-11 LAB — CBC WITH DIFFERENTIAL/PLATELET
Abs Immature Granulocytes: 0.01 10*3/uL (ref 0.00–0.07)
Basophils Absolute: 0.1 10*3/uL (ref 0.0–0.1)
Basophils Relative: 1 %
Eosinophils Absolute: 0.2 10*3/uL (ref 0.0–0.5)
Eosinophils Relative: 3 %
HCT: 21.7 % — ABNORMAL LOW (ref 36.0–46.0)
Hemoglobin: 7 g/dL — ABNORMAL LOW (ref 12.0–15.0)
Immature Granulocytes: 0 %
Lymphocytes Relative: 42 %
Lymphs Abs: 2.5 10*3/uL (ref 0.7–4.0)
MCH: 25.8 pg — ABNORMAL LOW (ref 26.0–34.0)
MCHC: 32.3 g/dL (ref 30.0–36.0)
MCV: 80.1 fL (ref 80.0–100.0)
Monocytes Absolute: 0.3 10*3/uL (ref 0.1–1.0)
Monocytes Relative: 5 %
Neutro Abs: 2.9 10*3/uL (ref 1.7–7.7)
Neutrophils Relative %: 49 %
Platelets: 326 10*3/uL (ref 150–400)
RBC: 2.71 MIL/uL — ABNORMAL LOW (ref 3.87–5.11)
RDW: 16.1 % — ABNORMAL HIGH (ref 11.5–15.5)
WBC: 6 10*3/uL (ref 4.0–10.5)
nRBC: 0 % (ref 0.0–0.2)

## 2022-05-11 LAB — IRON AND TIBC
Iron: 39 ug/dL (ref 28–170)
Saturation Ratios: 23 % (ref 10.4–31.8)
TIBC: 169 ug/dL — ABNORMAL LOW (ref 250–450)
UIBC: 130 ug/dL

## 2022-05-11 LAB — COMPREHENSIVE METABOLIC PANEL
ALT: 13 U/L (ref 0–44)
AST: 15 U/L (ref 15–41)
Albumin: 2.4 g/dL — ABNORMAL LOW (ref 3.5–5.0)
Alkaline Phosphatase: 75 U/L (ref 38–126)
Anion gap: 11 (ref 5–15)
BUN: 22 mg/dL (ref 8–23)
CO2: 27 mmol/L (ref 22–32)
Calcium: 8.2 mg/dL — ABNORMAL LOW (ref 8.9–10.3)
Chloride: 102 mmol/L (ref 98–111)
Creatinine, Ser: 1.07 mg/dL — ABNORMAL HIGH (ref 0.44–1.00)
GFR, Estimated: 52 mL/min — ABNORMAL LOW (ref >60)
Glucose, Bld: 129 mg/dL — ABNORMAL HIGH (ref 70–99)
Potassium: 2.7 mmol/L — CL (ref 3.5–5.1)
Sodium: 140 mmol/L (ref 135–145)
Total Bilirubin: 0.4 mg/dL (ref 0.3–1.2)
Total Protein: 6.5 g/dL (ref 6.5–8.1)

## 2022-05-11 LAB — FERRITIN: Ferritin: 412 ng/mL — ABNORMAL HIGH (ref 11–307)

## 2022-05-11 MED ORDER — POTASSIUM CHLORIDE 20 MEQ PO PACK: 20.0000 meq | PACK | Freq: Two times a day (BID) | ORAL | 0 refills | Status: DC

## 2022-05-11 NOTE — Telephone Encounter (Signed)
Pt's husband said that he was notifed of the low potassium and the rx was sent to  pharmacy. It should eb ready per husband in 13 min

## 2022-05-11 NOTE — Progress Notes (Signed)
[facility] CONSULT NOTE  Patient Care Team: Remi Haggard, FNP as PCP - General (Family Medicine) Clent Jacks, RN as Oncology Nurse Navigator   CANCER STAGING   Cancer Staging  Pancreatic cancer [facility]) Staging form: Exocrine Pancreas, AJCC 8th Edition - Clinical stage from 03/09/2022: Stage IB (cT2, cN0, cM0) - Signed by Heath Lark, MD on 03/09/2022 Stage prefix: Initial diagnosis Total positive nodes: 0   ASSESSMENT & PLAN:  Shirley Nichols 81 y.o. female with pmh of hypertension and hyperlipidemia was seen for localized pancreatic cancer.  #Localized pancreatic cancer - MRCP on 03/03/2022 showed a 3.8 cm pancreatic head mass and biliary dilatation.  She had ERCP with dilatation of upper and mid third main bile duct with obstructing mass and 1 metal stent was placed.  Brushings were positive for adenocarcinoma. CT chest negative for metastatic disease.  -Patient is not a surgical candidate due to poor functional status. At this time, she will not be able to tolerate chemotherapy.  My plan is to consider single agent gemcitabine once functional status improves.  She is making gradual progress.  I re discussed with the patient and the husband about role of radiation with the localized pancreatic cancer.  They are interested.  Referral to radiation oncology placed.    -Foundation 1 CDX testing could not be performed due to insufficient sample.   -CA [id number] [id number] from 04/17/22  -Nutrition and palliative care on board.  # Hypokalemia -Potassium 2.7.  Sent a prescription for KCl granules 20 meq twice daily for 7 days.  Recheck lab on Wednesday. -Discussed about incorporating diet rich in potassium.  Patient was agreeable.  # Transaminitis -Likely from Dilantin.  Patient is off Dilantin now per neurology.  LFTs normalized.  #Anemia -likely form underlying cancer -Hemoglobin 7 today.  She will come back in 1 week for repeat labs and hold tube for possible blood  transfusion. -B12, folate normal.  Ferritin elevated ~500.  Will add on iron/TIBC.  If saturation is less than 10%, will consider doing IV Feraheme next week.  #?Seizure -During hospitalization in October 2023, was started on Dilantin and Keppra for possible seizures.  Tapered off Dilantin.  Now being tapered off Keppra.  Orders Placed This Encounter  Procedures   CBC with Differential    Standing Status:   Future    Standing Expiration Date:   14/43/1540   Basic metabolic panel    Standing Status:   Future    Standing Expiration Date:   05/11/2023   CBC with Differential    Standing Status:   Future    Standing Expiration Date:   05/11/2023   Comprehensive metabolic panel    Standing Status:   Future    Standing Expiration Date:   05/11/2023   Ambulatory referral to Radiation Oncology    Referral Priority:   Routine    Referral Type:   Consultation    Referral Reason:   Specialty Services Required    Requested Specialty:   Radiation Oncology    Number of Visits Requested:   1   Hold Tube- Blood Bank    Standing Status:   Future    Standing Expiration Date:   05/12/2023   RTC in 1 week for labs and Feraheme In 2 weeks for MD visit, labs.  The total time spent in the appointment was 30 minutes encounter with patients including review of chart and various tests results, discussions about plan of care and coordination of care  plan   All questions were answered. The patient knows to call the clinic with any problems, questions or concerns. No barriers to learning was detected.  Jane Canary, MD 12/14/20233:40 PM   HISTORY OF PRESENTING ILLNESS:  Shirley Nichols 81 y.o. female with pmh of hypertension and hyperlipidemia was seen for localized pancreatic cancer.  Patient was sent to ED by PCP in October 2023 for elevated liver enzymes and concern for biliary obstruction. She had MRCP on 03/03/2022 which showed a 3.8 cm pancreatic head mass and biliary dilatation.  She had ERCP  with dilatation of upper and mid third main bile duct with obstructing mass and 1 metal stent was placed.  Brushings were positive for adenocarcinoma.  Was complicated by post ERCP pancreatitis.  Her hospital course was prolonged by acute respiratory failure which required intubation.  There was suspicion for seizure and was started on Keppra and Dilantin by neurology.  MRI brain and LP was negative for malignancy.  CT chest was negative for metastatic disease.  She also had acute renal failure which improved with fluids.  Prior to hospitalization in October, patient was functional and very independent with her ADLs.  She was driving and doing things.  Interval history Patient was seen today accompanied by husband.  Her functional status is gradually improving.  She has been able to walk to the bathroom and dress herself.  Her appetite has improved.  She continues to be unstable with her gait.  Denies any pain, nausea, vomiting, diarrhea, fever, chills, bowel or bladder issues.  I have reviewed her chart and materials related to her cancer extensively and collaborated history with the patient. Summary of oncologic history is as follows: Oncology History  Pancreatic cancer ([place])  03/03/2022 Imaging   CT abdomen and pelvis  Hazy stranding about the pancreatic head suggestive of pancreatitis. This is incompletely evaluated without IV contrast. Dilated gallbladder and common bile duct. MRI/MRCP is recommended to evaluate for choledocholithiasis or obstructing mass.   03/03/2022 Imaging   MRI  Image degradation by motion artifact noted.   3.4 cm masslike density in the pancreatic head, highly suspicious for pancreatic carcinoma.    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 No evidence of choledocholithiasis.   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   03/05/2022 Pathology Results   FINAL  MICROSCOPIC DIAGNOSIS:  - Malignant cells consistent with adenocarcinoma    03/05/2022 Procedure   ERCP - The major papilla appeared normal. - The upper third of the main bile duct and middle third of the main bile duct were dilated, with a mass causing an obstruction. - A biliary sphincterotomy was performed. - One covered metal stent was placed into the common bile duct. - Cells for cytology obtained in the lower third of the main duct.   03/09/2022 Cancer Staging   Staging form: Exocrine Pancreas, AJCC 8th Edition - Clinical stage from 03/09/2022: Stage IB (cT2, cN0, cM0) - Signed by Heath Lark, MD on 03/09/2022 Stage prefix: Initial diagnosis Total positive nodes: 0    Hospital Admission   Admit date: 04/17/2022 to 04/22/2022 Admission diagnosis: Generalized weakness and fatigue, poor p.o. intake. Additional comments: Repeat CT abdomen pelvis done for worsening transaminitis showed stable pancreatic mass and metallic stents in place.  No concerns for infection.  Venous Doppler showed right Baker's cyst.  Rehab on discharge declined by family.     MEDICAL HISTORY:  Past Medical History:  Diagnosis Date   Cancer ([place])    skin cancer   Hyperlipidemia    Hypertension     SURGICAL HISTORY: Past Surgical History:  Procedure Laterality Date   ABDOMINAL HYSTERECTOMY     BILIARY BRUSHING  03/05/2022   Procedure: BILIARY BRUSHING;  Surgeon: Carol Ada, MD;  Location: [facility] END[facility]COPY;  Service: Gastroenterology;;   BILIARY STENT PLACEMENT  03/05/2022   Procedure: BILIARY STENT PLACEMENT;  Surgeon: Carol Ada, MD;  Location: [facility] ENDOSCOPY;  Service: Gastroenterology;;   COLONOSCOPY     02/21/2002 adenomatous polyp, 03/23/2011   COLONOSCOPY W/ POLYPECTOMY  02/2000   TA polyp removed from ascending colon.  performed through [facility] (? affiliate in [place])   COLONOSCOPY WITH PROPOFOL N/A 11/03/2016   Procedure: COLONOSCOPY WITH PROPOFOL;  Surgeon: Manya Silvas, MD;   Location: [facility] ENDOSCOPY;  Service: Endoscopy;  Laterality: N/A;   ERCP N/A 03/05/2022   Procedure: ENDOSCOPIC RETROGRADE CHOLANGIOPANCREATOGRAPHY (ERCP);  Surgeon: Carol Ada, MD;  Location: [place];  Service: Gastroenterology;  Laterality: N/A;   HEMORROIDECTOMY     TONSILLECTOMY      SOCIAL HISTORY: Social History   Socioeconomic History   Marital status: Married    Spouse name: Not on file   Number of children: Not on file   Years of education: Not on file   Highest education level: Not on file  Occupational History   Not on file  Tobacco Use   Smoking status: Former   Smokeless tobacco: Never  Substance and Sexual Activity   Alcohol use: No   Drug use: No   Sexual activity: Yes  Other Topics Concern   Not on file  Social History Narrative   Not on file   Social Determinants of Health   Financial Resource Strain: Not on file  Food Insecurity: No Food Insecurity (04/17/2022)   Hunger Vital Sign    Worried About Running Out of Food in the Last Year: Never true    Ran Out of Food in the Last Year: Never true  Transportation Needs: No Transportation Needs (04/17/2022)   PRAPARE - Hydrologist (Medical): No    Lack of Transportation (Non-Medical): No  Physical Activity: Not on file  Stress: Not on file  Social Connections: Not on file  Intimate Partner Violence: Not At Risk (04/17/2022)   Humiliation, Afraid, Rape, and Kick questionnaire    Fear of Current or Ex-Partner: No    Emotionally Abused: No    Physically Abused: No    Sexually Abused: No    FAMILY HISTORY: Family History  Problem Relation Age of Onset   Seizures Sister     ALLERGIES:  has No Known Allergies.  MEDICATIONS:  Current Outpatient Medications  Medication Sig Dispense Refill   acetaminophen (TYLENOL) 325 MG tablet Take 2 tablets (650 mg total) by mouth every 6 (six) hours as needed for mild pain, fever or headache.     aspirin EC 81 MG tablet Take 81  mg by mouth daily.     ezetimibe (ZETIA) 10 MG tablet Take 1 tablet (10 mg total) by mouth every evening. 90 tablet 3   levETIRAcetam (KEPPRA) 750 MG tablet Take 1 tablet (750 mg total) by mouth 2 (two) times daily. 180 tablet 3   magnesium oxide (MAG-OX) 400 MG tablet Take 2 tablets (800 mg total) by mouth 2 (two) times daily. 120 tablet 0   megestrol (MEGACE) 40 MG/ML suspension Take 400 mg by mouth daily.  ondansetron (ZOFRAN) 4 MG tablet Take 1 tablet (4 mg total) by mouth every 8 (eight) hours as needed for nausea or vomiting. 20 tablet 0   oxyCODONE (OXY IR/ROXICODONE) 5 MG immediate release tablet Take 1/2 tablet (2.5 mg total) by mouth every 8 (eight) hours as needed for moderate pain or severe pain. 10 tablet 0   polyethylene glycol (MIRALAX / GLYCOLAX) 17 g packet Take 17 g by mouth daily. 14 each 0   potassium chloride (KLOR-CON) 20 MEQ packet Take 20 mEq by mouth 2 (two) times daily for 7 days. 14 packet 0   senna-docusate (SENOKOT-S) 8.6-50 MG tablet Take 1 tablet by mouth 2 (two) times daily.     SYMBICORT 160-4.5 MCG/ACT inhaler Inhale 2 puffs into the lungs as needed (for wheezing or shortness of breath). 10.2 g 4   phenytoin (DILANTIN) 100 MG ER capsule Take 1 capsule (100 mg total) by mouth at bedtime for 7 days. 7 capsule 0   No current facility-administered medications for this visit.    REVIEW OF SYSTEMS:   Pertinent information mentioned in HPI All other systems were reviewed with the patient and are negative.  PHYSICAL EXAMINATION: ECOG PERFORMANCE STATUS: 3 - Symptomatic, >50% confined to bed  Vitals:   05/11/22 1451  BP: (!) 134/56  Pulse: 70  Resp: 20  Temp: 98.6 F (37 C)  SpO2: 100%    Filed Weights   05/11/22 1451  Weight: 121 lb 8 oz (55.1 kg)     GENERAL:alert, no distress and comfortable SKIN: skin color, texture, turgor are normal, no rashes or significant lesions EYES: normal, conjunctiva are pink and non-injected, sclera  clear OROPHARYNX:no exudate, no erythema and lips, buccal mucosa, and tongue normal  NECK: supple, thyroid normal size, non-tender, without nodularity LYMPH:  no palpable lymphadenopathy in the cervical, axillary or inguinal LUNGS: clear to auscultation and percussion with normal breathing effort HEART: regular rate & rhythm and no murmurs and no lower extremity edema ABDOMEN:abdomen soft, non-tender and normal bowel sounds Musculoskeletal:no cyanosis of digits and no clubbing  PSYCH: alert & oriented x 3 with fluent speech NEURO: no focal motor/sensory deficits  LABORATORY DATA:  I have reviewed the data as listed Lab Results  Component Value Date   WBC 6.0 05/11/2022   HGB 7.0 (L) 05/11/2022   HCT 21.7 (L) 05/11/2022   MCV 80.1 05/11/2022   PLT 326 05/11/2022   Recent Labs    03/14/22 1430 03/14/22 1715 04/22/22 0438 04/25/22 1000 05/11/22 1439  NA  --    < > 136 132* 140  K  --    < > 3.9 3.6 2.7*  CL  --    < > 100 97* 102  CO2  --    < > '26 26 27  '$ GLUCOSE  --    < > 126* 164* 129*  BUN  --    < > 30* 22 22  CREATININE  --    < > 0.78 0.77 1.07*  CALCIUM  --    < > 8.9 8.4* 8.2*  GFRNONAA  --    < > >60 >60 52*  PROT  --    < > 6.3* 7.2 6.5  ALBUMIN  --    < > 2.2* 2.6* 2.4*  AST  --    < > 66* 40 15  ALT  --    < > 124* 90* 13  ALKPHOS  --    < > 179* 176* 75  BILITOT  --    < >  0.5 0.4 0.4  BILIDIR 1.9*  --   --   --   --    < > = values in this interval not displayed.    RADIOGRAPHIC STUDIES: I have personally reviewed the radiological images as listed and agreed with the findings in the report. ECHOCARDIOGRAM COMPLETE  Result Date: 04/18/2022    ECHOCARDIOGRAM REPORT   Patient Name:   Shirley Nichols Date of Exam: 04/18/2022 Medical Rec #:  [id number]        Height:       64.0 in Accession #:    [id number]       Weight:       123.0 lb Date of Birth:  10/17/1940         BSA:          1.591 m Patient Age:    4 years         BP:           154/70 mmHg Patient  Gender: F                HR:           83 bpm. Exam Location:  [facility] Procedure: 2D Echo, Cardiac Doppler and Color Doppler Indications:     CHF-acute diastolic Y10.17  History:         Patient has no prior history of Echocardiogram examinations.                  Risk Factors:Hypertension and Dyslipidemia.  Sonographer:     Sherrie Sport Referring Phys:  [id number] Soledad Gerlach NIU Diagnosing Phys: Serafina Royals MD  Sonographer Comments: Image quality was good. IMPRESSIONS  1. Left ventricular ejection fraction, by estimation, is 60 to 65%. The left ventricle has normal function. The left ventricle has no regional wall motion abnormalities. Left ventricular diastolic parameters were normal.  2. Right ventricular systolic function is normal. The right ventricular size is normal.  3. The mitral valve is normal in structure. Trivial mitral valve regurgitation.  4. The aortic valve is normal in structure. Aortic valve regurgitation is not visualized. FINDINGS  Left Ventricle: Left ventricular ejection fraction, by estimation, is 60 to 65%. The left ventricle has normal function. The left ventricle has no regional wall motion abnormalities. The left ventricular internal cavity size was normal in size. There is  no left ventricular hypertrophy. Left ventricular diastolic parameters were normal. Right Ventricle: The right ventricular size is normal. No increase in right ventricular wall thickness. Right ventricular systolic function is normal. Left Atrium: Left atrial size was normal in size. Right Atrium: Right atrial size was normal in size. Pericardium: There is no evidence of pericardial effusion. Mitral Valve: The mitral valve is normal in structure. Trivial mitral valve regurgitation. Tricuspid Valve: The tricuspid valve is normal in structure. Tricuspid valve regurgitation is trivial. Aortic Valve: The aortic valve is normal in structure. Aortic valve regurgitation is not visualized. Aortic valve mean gradient measures 5.0 mmHg.  Aortic valve peak gradient measures 9.5 mmHg. Aortic valve area, by VTI measures 2.83 cm. Pulmonic Valve: The pulmonic valve was normal in structure. Pulmonic valve regurgitation is trivial. Aorta: The aortic root and ascending aorta are structurally normal, with no evidence of dilitation. IAS/Shunts: No atrial level shunt detected by color flow Doppler.  LEFT VENTRICLE PLAX 2D LVIDd:         4.10 cm   Diastology LVIDs:         3.00 cm   LV e'  medial:    4.68 cm/s LV PW:         1.10 cm   LV E/e' medial:  19.8 LV IVS:        1.00 cm   LV e' lateral:   7.40 cm/s LVOT diam:     2.10 cm   LV E/e' lateral: 12.5 LV SV:         77 LV SV Index:   48 LVOT Area:     3.46 cm  RIGHT VENTRICLE RV Basal diam:  2.60 cm RV Mid diam:    2.00 cm RV S prime:     13.90 cm/s TAPSE (M-mode): 2.5 cm LEFT ATRIUM             Index        RIGHT ATRIUM           Index LA diam:        3.20 cm 2.01 cm/m   RA Area:     14.50 cm LA Vol (A2C):   43.7 ml 27.46 ml/m  RA Volume:   32.70 ml  20.55 ml/m LA Vol (A4C):   27.1 ml 17.03 ml/m LA Biplane Vol: 37.0 ml 23.25 ml/m  AORTIC VALVE AV Area (Vmax):    2.52 cm AV Area (Vmean):   2.43 cm AV Area (VTI):     2.83 cm AV Vmax:           154.00 cm/s AV Vmean:          102.000 cm/s AV VTI:            0.272 m AV Peak Grad:      9.5 mmHg AV Mean Grad:      5.0 mmHg LVOT Vmax:         112.00 cm/s LVOT Vmean:        71.500 cm/s LVOT VTI:          0.222 m LVOT/AV VTI ratio: 0.82  AORTA Ao Root diam: 3.30 cm MITRAL VALVE                TRICUSPID VALVE MV Area (PHT): 3.77 cm     TR Peak grad:   14.3 mmHg MV Decel Time: 201 msec     TR Vmax:        189.00 cm/s MV E velocity: 92.80 cm/s MV A velocity: 129.00 cm/s  SHUNTS MV E/A ratio:  0.72         Systemic VTI:  0.22 m                             Systemic Diam: 2.10 cm Serafina Royals MD Electronically signed by Serafina Royals MD Signature Date/Time: 04/18/2022/12:49:06 PM    Final    CT ABDOMEN PELVIS W CONTRAST  Result Date: 04/17/2022 CLINICAL  DATA:  Recently diagnosed pancreatic cancer. Recent sphincterotomy and biliary stent placement. Progressive weakness. * Tracking Code: BO * EXAM: CT ABDOMEN AND PELVIS WITH CONTRAST TECHNIQUE: Multidetector CT imaging of the abdomen and pelvis was performed using the standard protocol following bolus administration of intravenous contrast. RADIATION DOSE REDUCTION: This exam was performed according to the departmental dose-optimization program which includes automated exposure control, adjustment of the mA and/or kV according to patient size and/or use of iterative reconstruction technique. CONTRAST:  13m OMNIPAQUE IOHEXOL 300 MG/ML  SOLN COMPARISON:  Abdominopelvic CT 04/05/2022. Abdominal MRI 03/03/2022. FINDINGS: Lower chest: Patchy atelectasis at both lung bases. No pleural  effusion or suspicious pulmonary nodule. There is atherosclerosis of the aorta and coronary arteries. Hepatobiliary: Metallic biliary stent remains in place with persistent pneumobilia. The gallbladder is decompressed, without wall thickening or stones. Unchanged appearance of the liver without suspicious abnormality. Pancreas: Known hypodense mass involving the pancreatic head is similar to previous study, measuring approximately 2.1 x 1.3 cm on image 29/3. Stable pancreatic ductal dilatation and mild surrounding inflammation. Spleen: Normal in size without focal abnormality. Adrenals/Urinary Tract: Unchanged 2.2 cm left adrenal nodule which was previously characterized by noncontrast CT as an adenoma. No follow-up imaging recommended. The right adrenal gland appears normal. No evidence of urinary tract calculus, suspicious renal lesion or hydronephrosis. Unchanged bilateral renal cysts; no follow-up imaging recommended. The bladder appears unremarkable for its degree of distention. Stomach/Bowel: No enteric contrast administered. The stomach appears unremarkable for its degree of distension. No evidence of bowel wall thickening,  distention or surrounding inflammatory change. Vascular/Lymphatic: There are no enlarged abdominal or pelvic lymph nodes. Diffuse aortic and branch vessel atherosclerosis without evidence of aneurysm or large vessel occlusion. No gross vascular encasement identified in the porta hepatis. Reproductive: Status post hysterectomy.  No adnexal mass. Other: No ascites, free air or peritoneal nodularity. Previously noted small subcutaneous nodule within the left anterior abdominal wall has slightly decreased in size and may be related to previous subcutaneous injection. Musculoskeletal: No acute or significant osseous findings. Facet hypertrophy in the lower lumbar spine with a degenerative grade 1 anterolisthesis at L4-5. IMPRESSION: 1. No acute findings or clear explanation for the patient's symptoms. 2. Stable appearance of the known pancreatic head mass with associated pancreatic ductal dilatation and pneumobilia following biliary stenting. No evidence of metastatic disease. 3. Aortic Atherosclerosis (ICD10-I70.0). Electronically Signed   By: Richardean Sale M.D.   On: 04/17/2022 16:43   US Venous Img Lower Unilateral Right  Result Date: 04/17/2022 CLINICAL DATA:  Swelling EXAM: Right LOWER EXTREMITY VENOUS DOPPLER ULTRASOUND TECHNIQUE: Gray-scale sonography with compression, as well as color and duplex ultrasound, were performed to evaluate the deep venous system(s) from the level of the common femoral vein through the popliteal and proximal calf veins. COMPARISON:  None Available. FINDINGS: VENOUS Normal compressibility of the common femoral, superficial femoral, and popliteal veins, as well as the visualized calf veins. Visualized portions of profunda femoral vein and great saphenous vein unremarkable. No filling defects to suggest DVT on grayscale or color Doppler imaging. Doppler waveforms show normal direction of venous flow, normal respiratory plasticity and response to augmentation. OTHER In the popliteal  fossa there is a 4.5 x 1.4 x 2.8 cm heterogenous oval fluid collection. There is no flow within or around this collection. This is incompletely assessed on the provided images. Limitations: none IMPRESSION: 1. No evidence of right lower extremity DVT. 2. In the popliteal fossa there is a 4.5 x 1.4 x 2.8 cm heterogenous oval fluid collection, which could represent Baker's cyst with hemorrhage or synovitis. Electronically Signed   By: Marin Roberts M.D.   On: 04/17/2022 10:26   DG Chest Portable 1 View  Result Date: 04/17/2022 CLINICAL DATA:  Weakness and fatigue in a female at age 89. EXAM: PORTABLE CHEST 1 VIEW COMPARISON:  March 14, 2022. FINDINGS: EKG leads project over the chest. Cardiomediastinal contours and hilar structures with persistent cardiac enlargement. Increased interstitial markings improved compared to most recent comparison imaging. No sign of lobar consolidative process. No pneumothorax. On limited assessment no acute skeletal findings. IMPRESSION: Cardiomegaly with improved interstitial markings compared to most  recent comparison imaging. No sign of lobar consolidative process. Difficult to exclude mild interstitial edema, difficult to differentiate from background chronic changes. Electronically Signed   By: Zetta Bills M.D.   On: 04/17/2022 09:34

## 2022-05-15 ENCOUNTER — Other Ambulatory Visit (HOSPITAL_COMMUNITY): Payer: Self-pay

## 2022-05-15 MED FILL — Ferumoxytol Inj 510 MG/17ML (30 MG/ML) (Elemental Fe): INTRAVENOUS | Qty: 17 | Status: AC

## 2022-05-16 ENCOUNTER — Inpatient Hospital Stay (HOSPITAL_BASED_OUTPATIENT_CLINIC_OR_DEPARTMENT_OTHER): Payer: PPO | Admitting: Hospice and Palliative Medicine

## 2022-05-16 DIAGNOSIS — Z515 Encounter for palliative care: Secondary | ICD-10-CM

## 2022-05-16 DIAGNOSIS — C250 Malignant neoplasm of head of pancreas: Secondary | ICD-10-CM | POA: Diagnosis not present

## 2022-05-16 NOTE — Progress Notes (Signed)
Virtual Visit via Telephone Note  I connected with [name] on 05/16/22 at  1:20 PM EST by telephone and verified that I am speaking with the correct person using two identifiers.  Location: Patient: Home Provider: Clinic   I discussed the limitations, risks, security and privacy concerns of performing an evaluation and management service by telephone and the availability of in person appointments. I also discussed with the patient that there may be a patient responsible charge related to this service. The patient expressed understanding and agreed to proceed.   History of Present Illness: Shirley Nichols is a 81 y.o. female with multiple medical problems including including newly diagnosed pancreatic cancer.  Patient was hospitalized 03/03/2022 to 03/31/2022 for evaluation of elevated LFTs. MRCP revealed a localized mass in the head of the pancreas with CBD stricture and diffuse biliary ductal dilation.  Patient was transferred to [facility] and underwent ERCP on 03/05/2022 with sphincterotomy and biliary stent placement.  Brush biopsy positive for pancreatic adenocarcinoma.  Hospitalization was also complicated by metabolic encephalopathy and questionable seizures requiring intubation.  Patient has been evaluated by hepatobiliary surgeon but she is not felt to be a surgical candidate.  Patient was hospitalized 04/17/2022 to 04/22/2022 with progressive weakness and ultimately improved with supportive care.  Palliative care was consulted to help address goals.    Observations/Objective: I called and spoke with patient and husband by phone.  Patient feels like she is improving.  Husband agrees but is concerned that patient's oral intake, particularly fluids, remains poor.  We discussed the importance of increasing access and encouraging oral intake but also availability of supportive care in the clinic if needed.  Patient denies pain or other symptoms.  Plan is for XRT and patient meets later  this week with Dr. Baruch Gouty.  Assessment and Plan: Pancreatic Cancer -pending XRT.  Symptomatically appears to be doing reasonably well.  Discussed supportive care services available in clinic if needed.  Follow Up Instructions: Telephone visit 1-2 months   I discussed the assessment and treatment plan with the patient. The patient was provided an opportunity to ask questions and all were answered. The patient agreed with the plan and demonstrated an understanding of the instructions.   The patient was advised to call back or seek an in-person evaluation if the symptoms worsen or if the condition fails to improve as anticipated.  I provided 10 minutes of non-face-to-face time during this encounter.   Irean Hong, NP

## 2022-05-17 ENCOUNTER — Inpatient Hospital Stay: Payer: PPO

## 2022-05-17 DIAGNOSIS — D509 Iron deficiency anemia, unspecified: Secondary | ICD-10-CM

## 2022-05-17 DIAGNOSIS — C250 Malignant neoplasm of head of pancreas: Secondary | ICD-10-CM

## 2022-05-17 DIAGNOSIS — D649 Anemia, unspecified: Secondary | ICD-10-CM

## 2022-05-17 DIAGNOSIS — R7989 Other specified abnormal findings of blood chemistry: Secondary | ICD-10-CM

## 2022-05-17 LAB — BASIC METABOLIC PANEL
Anion gap: 5 (ref 5–15)
BUN: 17 mg/dL (ref 8–23)
CO2: 25 mmol/L (ref 22–32)
Calcium: 8.8 mg/dL — ABNORMAL LOW (ref 8.9–10.3)
Chloride: 108 mmol/L (ref 98–111)
Creatinine, Ser: 0.94 mg/dL (ref 0.44–1.00)
GFR, Estimated: >60 mL/min (ref >60)
Glucose, Bld: 139 mg/dL — ABNORMAL HIGH (ref 70–99)
Potassium: 5.3 mmol/L — ABNORMAL HIGH (ref 3.5–5.1)
Sodium: 138 mmol/L (ref 135–145)

## 2022-05-17 LAB — CBC WITH DIFFERENTIAL/PLATELET
Abs Immature Granulocytes: 0.05 10*3/uL (ref 0.00–0.07)
Basophils Absolute: 0.1 10*3/uL (ref 0.0–0.1)
Basophils Relative: 1 %
Eosinophils Absolute: 0.2 10*3/uL (ref 0.0–0.5)
Eosinophils Relative: 2 %
HCT: 27.1 % — ABNORMAL LOW (ref 36.0–46.0)
Hemoglobin: 8.6 g/dL — ABNORMAL LOW (ref 12.0–15.0)
Immature Granulocytes: 1 %
Lymphocytes Relative: 28 %
Lymphs Abs: 2.6 10*3/uL (ref 0.7–4.0)
MCH: 26.1 pg (ref 26.0–34.0)
MCHC: 31.7 g/dL (ref 30.0–36.0)
MCV: 82.1 fL (ref 80.0–100.0)
Monocytes Absolute: 0.4 10*3/uL (ref 0.1–1.0)
Monocytes Relative: 5 %
Neutro Abs: 5.9 10*3/uL (ref 1.7–7.7)
Neutrophils Relative %: 63 %
Platelets: 379 10*3/uL (ref 150–400)
RBC: 3.3 MIL/uL — ABNORMAL LOW (ref 3.87–5.11)
RDW: 17.6 % — ABNORMAL HIGH (ref 11.5–15.5)
WBC: 9.3 10*3/uL (ref 4.0–10.5)
nRBC: 0 % (ref 0.0–0.2)

## 2022-05-17 LAB — IRON AND TIBC
Iron: 39 ug/dL (ref 28–170)
Saturation Ratios: 15 % (ref 10.4–31.8)
TIBC: 256 ug/dL (ref 250–450)
UIBC: 217 ug/dL

## 2022-05-17 LAB — SAMPLE TO BLOOD BANK

## 2022-05-18 ENCOUNTER — Ambulatory Visit
Admission: RE | Admit: 2022-05-18 | Discharge: 2022-05-18 | Disposition: A | Discharge status: Home / Self Care | Payer: PPO | Source: Ambulatory Visit | Attending: Radiation Oncology | Admitting: Radiation Oncology

## 2022-05-18 ENCOUNTER — Encounter: Payer: Self-pay | Admitting: Radiation Oncology

## 2022-05-18 VITALS — BP 135/96 | HR 78 | Temp 97.0°F | Resp 14 | Ht 64.0 in | Wt 119.0 lb

## 2022-05-18 DIAGNOSIS — C250 Malignant neoplasm of head of pancreas: Secondary | ICD-10-CM | POA: Diagnosis present

## 2022-05-18 NOTE — Consult Note (Signed)
NEW PATIENT EVALUATION  Name: Shirley Nichols  MRN: [id number]  Date:   05/18/2022     DOB: 1940-11-10   This 81 y.o. 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  REFERRING PHYSICIAN: Remi Haggard, FNP  CHIEF COMPLAINT:  Chief Complaint  Patient presents with   Pancreatic Cancer    Consult    DIAGNOSIS: The encounter diagnosis was Malignant neoplasm of head of pancreas ([place]).   PREVIOUS INVESTIGATIONS:  MRI scan CT scans reviewed Pathology report reviewed Clinical notes reviewed  HPI: Patient is a 81 year old female presented back in October with biliary obstruction.  Workup noted on MRI scan of a 3.4 cm masslike density pancreatic head highly suspicious for pancreatic carcinoma.  There is also diffuse biliary dilatation due to distal common bile duct stricture at the pancreatic head.  She had a metal stent placed during ERCP with brushings at that time positive for adenocarcinoma.  CT scan chest abdomen pelvis showed no evidence of metastatic disease.  She was declined for surgery based on her ECOG status.  She has been seen by medical oncology and is planning for single agent gemcitabine chemotherapy.  Her recent course has been complicated by seizures which probably was metabolic encephalopathy.  She is doing well today she is having no abdominal pain no overt evidence of jaundice.  She is currently being tapered off of Keppra.  She is accompanied by her husband today.  She is referred to ration collagen for consideration of treatment.  PLANNED TREATMENT REGIMEN: IMRT radiation therapy to her pancreas  PAST MEDICAL HISTORY:  has a past medical history of Cancer ([place]), Hyperlipidemia, and Hypertension.    PAST SURGICAL HISTORY:  Past Surgical History:  Procedure Laterality Date   ABDOMINAL HYSTERECTOMY     BILIARY BRUSHING  03/05/2022   Procedure: BILIARY BRUSHING;  Surgeon:  Carol Ada, MD;  Location: [facility] ENDOSCOPY;  Service: Gastroenterology;;   BILIARY STENT PLACEMENT  03/05/2022   Procedure: BILIARY STENT PLACEMENT;  Surgeon: Carol Ada, MD;  Location: [facility] ENDOSCOPY;  Service: Gastroenterology;;   COLONOSCOPY     02/21/2002 adenomatous polyp, 03/23/2011   COLONOSCOPY W/ POLYPECTOMY  02/2000   TA polyp removed from ascending colon.  performed through [facility] (? affiliate in [place])   COLONOSCOPY WITH PROPOFOL N/A 11/03/2016   Procedure: COLONOSCOPY WITH PROPOFOL;  Surgeon: Manya Silvas, MD;  Location: [facility] ENDOSCOPY;  Service: Endoscopy;  Laterality: N/A;   ERCP N/A 03/05/2022   Procedure: ENDOSCOPIC RETROGRADE CHOLANGIOPANCREATOGRAPHY (ERCP);  Surgeon: Carol Ada, MD;  Location: [place];  Service: Gastroenterology;  Laterality: N/A;   HEMORROIDECTOMY     TONSILLECTOMY      FAMILY HISTORY: family history includes Seizures in her sister.  SOCIAL HISTORY:  reports that she has quit smoking. She has never used smokeless tobacco. She reports that she does not drink alcohol and does not use drugs.  ALLERGIES: Patient has no known allergies.  MEDICATIONS:  Current Outpatient Medications  Medication Sig Dispense Refill   acetaminophen (TYLENOL) 325 MG tablet Take 2 tablets (650 mg total) by mouth every 6 (six) hours as needed for mild pain, fever or headache.     aspirin EC 81 MG tablet Take 81 mg by mouth daily.     ezetimibe (ZETIA) 10 MG tablet Take 1 tablet (10 mg total) by mouth every evening. 90 tablet 3   levETIRAcetam (KEPPRA) 750 MG tablet Take 1 tablet (750  mg total) by mouth 2 (two) times daily. 180 tablet 3   magnesium oxide (MAG-OX) 400 MG tablet Take 2 tablets (800 mg total) by mouth 2 (two) times daily. 120 tablet 0   megestrol (MEGACE) 40 MG/ML suspension Take 400 mg by mouth daily.     ondansetron (ZOFRAN) 4 MG tablet Take 1 tablet (4 mg total) by mouth every 8 (eight) hours as needed for nausea or vomiting. 20 tablet 0    oxyCODONE (OXY IR/ROXICODONE) 5 MG immediate release tablet Take 1/2 tablet (2.5 mg total) by mouth every 8 (eight) hours as needed for moderate pain or severe pain. 10 tablet 0   polyethylene glycol (MIRALAX / GLYCOLAX) 17 g packet Take 17 g by mouth daily. 14 each 0   potassium chloride (KLOR-CON) 20 MEQ packet Take 20 mEq by mouth 2 (two) times daily for 7 days. 14 packet 0   senna-docusate (SENOKOT-S) 8.6-50 MG tablet Take 1 tablet by mouth 2 (two) times daily.     SYMBICORT 160-4.5 MCG/ACT inhaler Inhale 2 puffs into the lungs as needed (for wheezing or shortness of breath). 10.2 g 4   No current facility-administered medications for this encounter.    ECOG PERFORMANCE STATUS:  0 - Asymptomatic  REVIEW OF SYSTEMS: Patient denies any weight loss, fatigue, weakness, fever, chills or night sweats. Patient denies any loss of vision, blurred vision. Patient denies any ringing  of the ears or hearing loss. No irregular heartbeat. Patient denies heart murmur or history of fainting. Patient denies any chest pain or pain radiating to her upper extremities. Patient denies any shortness of breath, difficulty breathing at night, cough or hemoptysis. Patient denies any swelling in the lower legs. Patient denies any nausea vomiting, vomiting of blood, or coffee ground material in the vomitus. Patient denies any stomach pain. Patient states has had normal bowel movements no significant constipation or diarrhea. Patient denies any dysuria, hematuria or significant nocturia. Patient denies any problems walking, swelling in the joints or loss of balance. Patient denies any skin changes, loss of hair or loss of weight. Patient denies any excessive worrying or anxiety or significant depression. Patient denies any problems with insomnia. Patient denies excessive thirst, polyuria, polydipsia. Patient denies any swollen glands, patient denies easy bruising or easy bleeding. Patient denies any recent infections, allergies  or URI. Patient "s visual fields have not changed significantly in recent time.   PHYSICAL EXAM: BP (!) 135/96   Pulse 78   Temp (!) 97 F (36.1 C)   Resp 14   Ht '5\' 4"'$  (1.626 m)   Wt 119 lb (54 kg)   BMI 20.43 kg/m  Frail-appearing female in NAD.  Well-developed well-nourished patient in NAD. HEENT reveals PERLA, EOMI, discs not visualized.  Oral cavity is clear. No oral mucosal lesions are identified. Neck is clear without evidence of cervical or supraclavicular adenopathy. Lungs are clear to A&P. Cardiac examination is essentially unremarkable with regular rate and rhythm without murmur rub or thrill. Abdomen is benign with no organomegaly or masses noted. Motor sensory and DTR levels are equal and symmetric in the upper and lower extremities. Cranial nerves II through XII are grossly intact. Proprioception is intact. No peripheral adenopathy or edema is identified. No motor or sensory levels are noted. Crude visual fields are within normal range.  LABORATORY DATA: Cytology reports reviewed    RADIOLOGY RESULTS: MRI scan CT scans reviewed compatible with above-stated findings.   IMPRESSION: Stage Ib adenocarcinoma the pancreatic head in an 81 year old  female  PLAN: At this time I have recommended IMRT radiation therapy to her pancreatic mass.  I would plan on delivering 54 Gray in 30 fractions.  We use motion management for treatment planning.  And for dimensional treatment planning.  Risks and benefits of treatment including possible nausea possible diarrhea alteration blood counts fatigue all were discussed in detail with the patient and her husband.  They both seem to comprehend the treatment plan well.  I have personally 7 ordered CT simulation for early next week.  Patient also be receiving single agent gemcitabine.  There will be extra effort by both professional staff as well as technical staff to coordinate and manage concurrent chemoradiation and ensuing side effects during her  treatments.  I would like to take this opportunity to thank you for allowing me to participate in the care of your patient.Noreene Filbert, MD

## 2022-05-23 ENCOUNTER — Telehealth: Payer: Self-pay | Admitting: *Deleted

## 2022-05-23 ENCOUNTER — Ambulatory Visit
Admission: RE | Admit: 2022-05-23 | Discharge: 2022-05-23 | Disposition: A | Discharge status: Home / Self Care | Payer: PPO | Source: Ambulatory Visit | Attending: Radiation Oncology | Admitting: Radiation Oncology

## 2022-05-23 DIAGNOSIS — C250 Malignant neoplasm of head of pancreas: Secondary | ICD-10-CM | POA: Diagnosis not present

## 2022-05-23 NOTE — Telephone Encounter (Signed)
Received Claim for for patient insurance . Completed and sent for physician Signature

## 2022-05-24 ENCOUNTER — Inpatient Hospital Stay: Payer: PPO

## 2022-05-24 NOTE — Progress Notes (Signed)
Nutrition Follow-up:  Patient with pancreatic cancer. Planning to start radiation on 1/9.    Spoke with husband via phone for follow-up.  Husband reports that appetite has improved so much that they have stopped megace.  Patient also has been taking off seizure medication.  Strength has improved.  Husband has been providing small frequent meals during the day.  Patient has not been drinking ensure for the last week due to increased appetite.  Denies pain, nausea, diarrhea, constipation.      Medications: reviewed  Labs: reviewed  Anthropometrics:   Weight 119 lb 12/21 119 lb on 11/28 131 lb 13.4 oz on 10/26 145 lb on 03/03/22  NUTRITION DIAGNOSIS: Inadequate oral intake improved    INTERVENTION:  Continue to encourage high calorie, high protein, small frequent meals     MONITORING, EVALUATION, GOAL: weight trends, intake   NEXT VISIT: phone call on Wed, Jan 17  Reshad Saab B. Zenia Resides, [place], [place] Registered Dietitian [phone]

## 2022-05-25 ENCOUNTER — Encounter: Payer: Self-pay | Admitting: Internal Medicine

## 2022-05-25 ENCOUNTER — Inpatient Hospital Stay: Payer: PPO

## 2022-05-25 ENCOUNTER — Inpatient Hospital Stay (HOSPITAL_BASED_OUTPATIENT_CLINIC_OR_DEPARTMENT_OTHER): Payer: PPO | Admitting: Internal Medicine

## 2022-05-25 VITALS — BP 137/74 | HR 71 | Temp 98.6°F | Resp 19 | Wt 117.4 lb

## 2022-05-25 DIAGNOSIS — D649 Anemia, unspecified: Secondary | ICD-10-CM

## 2022-05-25 DIAGNOSIS — D638 Anemia in other chronic diseases classified elsewhere: Secondary | ICD-10-CM | POA: Diagnosis not present

## 2022-05-25 DIAGNOSIS — C250 Malignant neoplasm of head of pancreas: Secondary | ICD-10-CM

## 2022-05-25 DIAGNOSIS — R7989 Other specified abnormal findings of blood chemistry: Secondary | ICD-10-CM

## 2022-05-25 DIAGNOSIS — E876 Hypokalemia: Secondary | ICD-10-CM | POA: Diagnosis not present

## 2022-05-25 LAB — CBC WITH DIFFERENTIAL/PLATELET
Abs Immature Granulocytes: 0.02 10*3/uL (ref 0.00–0.07)
Basophils Absolute: 0 10*3/uL (ref 0.0–0.1)
Basophils Relative: 0 %
Eosinophils Absolute: 0.2 10*3/uL (ref 0.0–0.5)
Eosinophils Relative: 2 %
HCT: 26.9 % — ABNORMAL LOW (ref 36.0–46.0)
Hemoglobin: 8.5 g/dL — ABNORMAL LOW (ref 12.0–15.0)
Immature Granulocytes: 0 %
Lymphocytes Relative: 32 %
Lymphs Abs: 2.4 10*3/uL (ref 0.7–4.0)
MCH: 25.8 pg — ABNORMAL LOW (ref 26.0–34.0)
MCHC: 31.6 g/dL (ref 30.0–36.0)
MCV: 81.5 fL (ref 80.0–100.0)
Monocytes Absolute: 0.4 10*3/uL (ref 0.1–1.0)
Monocytes Relative: 5 %
Neutro Abs: 4.6 10*3/uL (ref 1.7–7.7)
Neutrophils Relative %: 61 %
Platelets: 335 10*3/uL (ref 150–400)
RBC: 3.3 MIL/uL — ABNORMAL LOW (ref 3.87–5.11)
RDW: 17.3 % — ABNORMAL HIGH (ref 11.5–15.5)
WBC: 7.6 10*3/uL (ref 4.0–10.5)
nRBC: 0 % (ref 0.0–0.2)

## 2022-05-25 LAB — COMPREHENSIVE METABOLIC PANEL
ALT: 13 U/L (ref 0–44)
AST: 16 U/L (ref 15–41)
Albumin: 3.3 g/dL — ABNORMAL LOW (ref 3.5–5.0)
Alkaline Phosphatase: 76 U/L (ref 38–126)
Anion gap: 9 (ref 5–15)
BUN: 21 mg/dL (ref 8–23)
CO2: 23 mmol/L (ref 22–32)
Calcium: 8.8 mg/dL — ABNORMAL LOW (ref 8.9–10.3)
Chloride: 108 mmol/L (ref 98–111)
Creatinine, Ser: 1.02 mg/dL — ABNORMAL HIGH (ref 0.44–1.00)
GFR, Estimated: 55 mL/min — ABNORMAL LOW (ref >60)
Glucose, Bld: 112 mg/dL — ABNORMAL HIGH (ref 70–99)
Potassium: 3.6 mmol/L (ref 3.5–5.1)
Sodium: 140 mmol/L (ref 135–145)
Total Bilirubin: 0.4 mg/dL (ref 0.3–1.2)
Total Protein: 7.1 g/dL (ref 6.5–8.1)

## 2022-05-26 ENCOUNTER — Encounter: Payer: Self-pay | Admitting: Internal Medicine

## 2022-05-26 NOTE — Progress Notes (Signed)
[place] NOTE  Patient Care Team: Remi Haggard, FNP as PCP - General (Family Medicine) Clent Jacks, RN as Oncology Nurse Navigator Jane Canary, MD as Consulting Physician (Oncology)   CANCER STAGING   Cancer Staging  Pancreatic cancer [facility]) Staging form: Exocrine Pancreas, AJCC 8th Edition - Clinical stage from 03/09/2022: Stage IB (cT2, cN0, cM0) - Signed by Heath Lark, MD on 03/09/2022 Stage prefix: Initial diagnosis Total positive nodes: 0   ASSESSMENT & PLAN:  Shirley Nichols 81 y.o. female with pmh of hypertension and hyperlipidemia was seen for localized pancreatic cancer.  #Localized pancreatic cancer - MRCP on 03/03/2022 showed a 3.8 cm pancreatic head mass and biliary dilatation.  She had ERCP with dilatation of upper and mid third main bile duct with obstructing mass and 1 metal stent was placed.  Brushings were positive for adenocarcinoma. CT chest negative for metastatic disease.  -Patient is not a surgical candidate due to poor functional status.  Patient was evaluated by Dr. Baruch Gouty and plan to start IMRT on 06/06/2022 54 Gray in 30 fractions.  Patient is making gradual progress with her functional status.  We discussed about adding low-dose gemcitabine to radiation weekly.  Side effects such as low blood counts, increased risk of infection, need for blood transfusion, fatigue, decline in functional status was discussed.  After long discussion, keeping in focus with patient's quality of life we decided to proceed with radiation alone at this time.  I will follow-up with the patient closely during radiation and if she is tolerating it well will consider adding gemcitabine.   -Foundation 1 CDX testing could not be performed due to insufficient sample.   -CA 19-9 908 from 04/17/22  -Nutrition and palliative care on board.  # Hypokalemia -resolved. 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 monitor.  Transfuse for hemoglobin less than 7.  #?Seizure -During hospitalization in October 2023, was started on Dilantin and Keppra for possible seizures.  Tapered off Dilantin.  Now being tapered off Keppra.  Orders Placed This Encounter  Procedures   CBC with Differential    Standing Status:   Future    Standing Expiration Date:   05/25/2023   Comprehensive metabolic panel    Standing Status:   Future    Standing Expiration Date:   05/25/2023   Cancer antigen 19-9    Standing Status:   Future    Standing Expiration Date:   05/25/2023   RTC in 2 weeks for md visit, labs   The total time spent in the appointment was 30 minutes encounter with patients including review of chart and various tests results, discussions about plan of care and coordination of care plan   All questions were answered. The patient knows to call the clinic with any problems, questions or concerns. No barriers to learning was detected.  Jane Canary, MD 12/29/202310:20 AM   HISTORY OF PRESENTING ILLNESS:  Shirley Nichols 80 y.o. female with pmh of hypertension and hyperlipidemia was seen for localized pancreatic cancer.  Patient was sent to ED by PCP in October 2023 for elevated liver enzymes and concern for biliary obstruction. She had MRCP on 03/03/2022 which showed a 3.8 cm pancreatic head mass and biliary dilatation.  She had ERCP with dilatation of upper and mid third main bile duct with obstructing mass and 1 metal stent was placed.  Brushings were positive for adenocarcinoma.  Was complicated by post  ERCP pancreatitis.  Her hospital course was prolonged by acute respiratory failure which required intubation.  There was suspicion for seizure and was started on Keppra and Dilantin by neurology.  MRI brain and LP was negative for malignancy.  CT chest was negative for metastatic disease.  She also had acute renal failure which improved with fluids.  Prior to hospitalization in  October, patient was functional and very independent with her ADLs.  She was driving and doing things.  Interval history Patient was seen today accompanied by husband.  Her functional status continues to improve.  Today she walked by herself to the clinic.  Her appetite is improving.  Denies any pain.  She has been completely tapered off Dilantin.  Now she is being tapered off Keppra.  I have reviewed her chart and materials related to her cancer extensively and collaborated history with the patient. Summary of oncologic history is as follows: Oncology History  Pancreatic cancer ([place])  03/03/2022 Imaging   CT abdomen and pelvis  Hazy stranding about the pancreatic head suggestive of pancreatitis. This is incompletely evaluated without IV contrast. Dilated gallbladder and common bile duct. MRI/MRCP is recommended to evaluate for choledocholithiasis or obstructing mass.   03/03/2022 Imaging   MRI  Image degradation by motion artifact noted.   3.4 cm masslike density in the pancreatic head, highly suspicious for pancreatic carcinoma.    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 No evidence of choledocholithiasis.   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   03/05/2022 Pathology Results   FINAL MICROSCOPIC DIAGNOSIS:  - Malignant cells consistent with adenocarcinoma    03/05/2022 Procedure   ERCP - The major papilla appeared normal. - The upper third of the main bile duct and middle third of the main bile duct were dilated, with a mass causing an obstruction. - A biliary sphincterotomy was performed. - One covered metal stent was placed into the common bile duct. - Cells for cytology obtained in the lower third of the main duct.   03/09/2022 Cancer Staging   Staging form: Exocrine Pancreas, AJCC 8th Edition - Clinical stage from 03/09/2022: Stage IB (cT2, cN0, cM0) - Signed by  Heath Lark, MD on 03/09/2022 Stage prefix: Initial diagnosis Total positive nodes: 0    Hospital Admission   Admit date: 04/17/2022 to 04/22/2022 Admission diagnosis: Generalized weakness and fatigue, poor p.o. intake. Additional comments: Repeat CT abdomen pelvis done for worsening transaminitis showed stable pancreatic mass and metallic stents in place.  No concerns for infection.  Venous Doppler showed right Baker's cyst.  Rehab on discharge declined by family.     MEDICAL HISTORY:  Past Medical History:  Diagnosis Date   Cancer ([place])    skin cancer   Hyperlipidemia    Hypertension     SURGICAL HISTORY: Past Surgical History:  Procedure Laterality Date   ABDOMINAL HYSTERECTOMY     BILIARY BRUSHING  03/05/2022   Procedure: BILIARY BRUSHING;  Surgeon: Carol Ada, MD;  Location: [facility] ENDOSCOPY;  Service: Gastroenterology;;   BILIARY STENT PLACEMENT  03/05/2022   Procedure: BILIARY STENT PLACEMENT;  Surgeon: Carol Ada, MD;  Location: [facility] ENDOSCOPY;  Service: Gastroenterology;;   COLONOSCOPY     02/21/2002 adenomatous polyp, 03/23/2011   COLONOSCOPY W/ POLYPECTOMY  02/2000   TA polyp removed from ascending colon.  performed through [facility] (? affiliate in [place])   COLONOSCOPY WITH PROPOFOL N/A 11/03/2016   Procedure:  COLONOSCOPY WITH PROPOFOL;  Surgeon: Manya Silvas, MD;  Location: [facility] ENDOSCOPY;  Service: Endoscopy;  Laterality: N/A;   ERCP N/A 03/05/2022   Procedure: ENDOSCOPIC RETROGRADE CHOLANGIOPANCREATOGRAPHY (ERCP);  Surgeon: Carol Ada, MD;  Location: [place];  Service: Gastroenterology;  Laterality: N/A;   HEMORROIDECTOMY     TONSILLECTOMY      SOCIAL HISTORY: Social History   Socioeconomic History   Marital status: Married    Spouse name: Not on file   Number of children: Not on file   Years of education: Not on file   Highest education level: Not on file  Occupational History   Not on file  Tobacco Use   Smoking status: Former    Smokeless tobacco: Never  Substance and Sexual Activity   Alcohol use: No   Drug use: No   Sexual activity: Yes  Other Topics Concern   Not on file  Social History Narrative   Not on file   Social Determinants of Health   Financial Resource Strain: Not on file  Food Insecurity: No Food Insecurity (04/17/2022)   Hunger Vital Sign    Worried About Running Out of Food in the Last Year: Never true    Ran Out of Food in the Last Year: Never true  Transportation Needs: No Transportation Needs (04/17/2022)   PRAPARE - Hydrologist (Medical): No    Lack of Transportation (Non-Medical): No  Physical Activity: Not on file  Stress: Not on file  Social Connections: Not on file  Intimate Partner Violence: Not At Risk (04/17/2022)   Humiliation, Afraid, Rape, and Kick questionnaire    Fear of Current or Ex-Partner: No    Emotionally Abused: No    Physically Abused: No    Sexually Abused: No    FAMILY HISTORY: Family History  Problem Relation Age of Onset   Seizures Sister     ALLERGIES:  has No Known Allergies.  MEDICATIONS:  Current Outpatient Medications  Medication Sig Dispense Refill   acetaminophen (TYLENOL) 325 MG tablet Take 2 tablets (650 mg total) by mouth every 6 (six) hours as needed for mild pain, fever or headache.     aspirin EC 81 MG tablet Take 81 mg by mouth daily.     magnesium oxide (MAG-OX) 400 MG tablet Take 2 tablets (800 mg total) by mouth 2 (two) times daily. 120 tablet 0   SYMBICORT 160-4.5 MCG/ACT inhaler Inhale 2 puffs into the lungs as needed (for wheezing or shortness of breath). 10.2 g 4   megestrol (MEGACE) 40 MG/ML suspension Take 400 mg by mouth daily. (Patient not taking: Reported on 05/25/2022)     No current facility-administered medications for this visit.    REVIEW OF SYSTEMS:   Pertinent information mentioned in HPI All other systems were reviewed with the patient and are negative.  PHYSICAL  EXAMINATION: ECOG PERFORMANCE STATUS: 3 - Symptomatic, >50% confined to bed  Vitals:   05/25/22 1349  BP: 137/74  Pulse: 71  Resp: 19  Temp: 98.6 F (37 C)  SpO2: 99%    Filed Weights   05/25/22 1349  Weight: 117 lb 6.4 oz (53.3 kg)     GENERAL:alert, no distress and comfortable SKIN: skin color, texture, turgor are normal, no rashes or significant lesions EYES: normal, conjunctiva are pink and non-injected, sclera clear OROPHARYNX:no exudate, no erythema and lips, buccal mucosa, and tongue normal  NECK: supple, thyroid normal size, non-tender, without nodularity LYMPH:  no palpable lymphadenopathy in the  cervical, axillary or inguinal LUNGS: clear to auscultation and percussion with normal breathing effort HEART: regular rate & rhythm and no murmurs and no lower extremity edema ABDOMEN:abdomen soft, non-tender and normal bowel sounds Musculoskeletal:no cyanosis of digits and no clubbing  PSYCH: alert & oriented x 3 with fluent speech NEURO: no focal motor/sensory deficits  LABORATORY DATA:  I have reviewed the data as listed Lab Results  Component Value Date   WBC 7.6 05/25/2022   HGB 8.5 (L) 05/25/2022   HCT 26.9 (L) 05/25/2022   MCV 81.5 05/25/2022   PLT 335 05/25/2022   Recent Labs    03/14/22 1430 03/14/22 1715 04/25/22 1000 05/11/22 1439 05/17/22 1303 05/25/22 1338  NA  --    < > 132* 140 138 140  K  --    < > 3.6 2.7* 5.3* 3.6  CL  --    < > 97* 102 108 108  CO2  --    < > '26 27 25 23  '$ GLUCOSE  --    < > 164* 129* 139* 112*  BUN  --    < > '22 22 17 21  '$ CREATININE  --    < > 0.77 1.07* 0.94 1.02*  CALCIUM  --    < > 8.4* 8.2* 8.8* 8.8*  GFRNONAA  --    < > >60 52* >60 55*  PROT  --    < > 7.2 6.5  --  7.1  ALBUMIN  --    < > 2.6* 2.4*  --  3.3*  AST  --    < > 40 15  --  16  ALT  --    < > 90* 13  --  13  ALKPHOS  --    < > 176* 75  --  76  BILITOT  --    < > 0.4 0.4  --  0.4  BILIDIR 1.9*  --   --   --   --   --    < > = values in this interval  not displayed.    RADIOGRAPHIC STUDIES: I have personally reviewed the radiological images as listed and agreed with the findings in the report. No results found.

## 2022-05-30 ENCOUNTER — Encounter: Payer: Self-pay | Admitting: Internal Medicine

## 2022-05-30 NOTE — Telephone Encounter (Signed)
Form was signed and given to Mr Steinhaus per Vickie Epley, [place]. No copy was made of the form, but she will ask Mr Devos to send [place] a copy for her chart

## 2022-05-31 ENCOUNTER — Other Ambulatory Visit (HOSPITAL_COMMUNITY): Payer: Self-pay

## 2022-05-31 DIAGNOSIS — C250 Malignant neoplasm of head of pancreas: Secondary | ICD-10-CM | POA: Diagnosis present

## 2022-06-02 ENCOUNTER — Encounter: Payer: Self-pay | Admitting: Internal Medicine

## 2022-06-05 ENCOUNTER — Ambulatory Visit
Admission: RE | Admit: 2022-06-05 | Discharge: 2022-06-05 | Disposition: A | Discharge status: Home / Self Care | Payer: PPO | Source: Ambulatory Visit | Attending: Radiation Oncology | Admitting: Radiation Oncology

## 2022-06-06 ENCOUNTER — Ambulatory Visit
Admission: RE | Admit: 2022-06-06 | Discharge: 2022-06-06 | Disposition: A | Discharge status: Home / Self Care | Payer: PPO | Source: Ambulatory Visit | Attending: Radiation Oncology | Admitting: Radiation Oncology

## 2022-06-06 ENCOUNTER — Other Ambulatory Visit: Payer: Self-pay

## 2022-06-06 DIAGNOSIS — C250 Malignant neoplasm of head of pancreas: Secondary | ICD-10-CM | POA: Diagnosis not present

## 2022-06-06 LAB — RAD ONC ARIA SESSION SUMMARY
Course Elapsed Days: 0
Plan Fractions Treated to Date: 1
Plan Prescribed Dose Per Fraction: 1.8 Gy
Plan Total Fractions Prescribed: 30
Plan Total Prescribed Dose: 54 Gy
Reference Point Dosage Given to Date: 1.8 Gy
Reference Point Session Dosage Given: 1.8 Gy
Session Number: 1

## 2022-06-07 ENCOUNTER — Ambulatory Visit
Admission: RE | Admit: 2022-06-07 | Discharge: 2022-06-07 | Disposition: A | Discharge status: Home / Self Care | Payer: PPO | Source: Ambulatory Visit | Attending: Radiation Oncology | Admitting: Radiation Oncology

## 2022-06-07 ENCOUNTER — Other Ambulatory Visit: Payer: Self-pay

## 2022-06-07 DIAGNOSIS — C250 Malignant neoplasm of head of pancreas: Secondary | ICD-10-CM | POA: Diagnosis not present

## 2022-06-07 LAB — RAD ONC ARIA SESSION SUMMARY
Course Elapsed Days: 1
Plan Fractions Treated to Date: 2
Plan Prescribed Dose Per Fraction: 1.8 Gy
Plan Total Fractions Prescribed: 30
Plan Total Prescribed Dose: 54 Gy
Reference Point Dosage Given to Date: 3.6 Gy
Reference Point Session Dosage Given: 1.8 Gy
Session Number: 2

## 2022-06-08 ENCOUNTER — Ambulatory Visit
Admission: RE | Admit: 2022-06-08 | Discharge: 2022-06-08 | Disposition: A | Discharge status: Home / Self Care | Payer: PPO | Source: Ambulatory Visit | Attending: Radiation Oncology | Admitting: Radiation Oncology

## 2022-06-08 ENCOUNTER — Other Ambulatory Visit: Payer: Self-pay

## 2022-06-08 DIAGNOSIS — C250 Malignant neoplasm of head of pancreas: Secondary | ICD-10-CM | POA: Diagnosis not present

## 2022-06-08 LAB — RAD ONC ARIA SESSION SUMMARY
Course Elapsed Days: 2
Plan Fractions Treated to Date: 3
Plan Prescribed Dose Per Fraction: 1.8 Gy
Plan Total Fractions Prescribed: 30
Plan Total Prescribed Dose: 54 Gy
Reference Point Dosage Given to Date: 5.4 Gy
Reference Point Session Dosage Given: 1.8 Gy
Session Number: 3

## 2022-06-09 ENCOUNTER — Other Ambulatory Visit: Payer: Self-pay

## 2022-06-09 ENCOUNTER — Encounter: Payer: Self-pay | Admitting: Internal Medicine

## 2022-06-09 ENCOUNTER — Inpatient Hospital Stay: Payer: PPO

## 2022-06-09 ENCOUNTER — Ambulatory Visit
Admission: RE | Admit: 2022-06-09 | Discharge: 2022-06-09 | Disposition: A | Discharge status: Home / Self Care | Payer: PPO | Source: Ambulatory Visit | Attending: Radiation Oncology | Admitting: Radiation Oncology

## 2022-06-09 ENCOUNTER — Inpatient Hospital Stay (HOSPITAL_BASED_OUTPATIENT_CLINIC_OR_DEPARTMENT_OTHER): Payer: PPO | Admitting: Internal Medicine

## 2022-06-09 VITALS — BP 151/68 | HR 77 | Temp 98.6°F | Resp 20 | Wt 119.1 lb

## 2022-06-09 DIAGNOSIS — D649 Anemia, unspecified: Secondary | ICD-10-CM

## 2022-06-09 DIAGNOSIS — C250 Malignant neoplasm of head of pancreas: Secondary | ICD-10-CM

## 2022-06-09 DIAGNOSIS — E876 Hypokalemia: Secondary | ICD-10-CM

## 2022-06-09 DIAGNOSIS — R413 Other amnesia: Secondary | ICD-10-CM | POA: Insufficient documentation

## 2022-06-09 DIAGNOSIS — R410 Disorientation, unspecified: Secondary | ICD-10-CM

## 2022-06-09 LAB — CBC WITH DIFFERENTIAL/PLATELET
Abs Immature Granulocytes: 0.03 10*3/uL (ref 0.00–0.07)
Basophils Absolute: 0.1 10*3/uL (ref 0.0–0.1)
Basophils Relative: 1 %
Eosinophils Absolute: 0.1 10*3/uL (ref 0.0–0.5)
Eosinophils Relative: 1 %
HCT: 28.5 % — ABNORMAL LOW (ref 36.0–46.0)
Hemoglobin: 8.9 g/dL — ABNORMAL LOW (ref 12.0–15.0)
Immature Granulocytes: 0 %
Lymphocytes Relative: 28 %
Lymphs Abs: 2.2 10*3/uL (ref 0.7–4.0)
MCH: 26 pg (ref 26.0–34.0)
MCHC: 31.2 g/dL (ref 30.0–36.0)
MCV: 83.3 fL (ref 80.0–100.0)
Monocytes Absolute: 0.4 10*3/uL (ref 0.1–1.0)
Monocytes Relative: 4 %
Neutro Abs: 5.3 10*3/uL (ref 1.7–7.7)
Neutrophils Relative %: 66 %
Platelets: 294 10*3/uL (ref 150–400)
RBC: 3.42 MIL/uL — ABNORMAL LOW (ref 3.87–5.11)
RDW: 17.6 % — ABNORMAL HIGH (ref 11.5–15.5)
WBC: 8.1 10*3/uL (ref 4.0–10.5)
nRBC: 0 % (ref 0.0–0.2)

## 2022-06-09 LAB — RAD ONC ARIA SESSION SUMMARY
Course Elapsed Days: 3
Plan Fractions Treated to Date: 4
Plan Prescribed Dose Per Fraction: 1.8 Gy
Plan Total Fractions Prescribed: 30
Plan Total Prescribed Dose: 54 Gy
Reference Point Dosage Given to Date: 7.2 Gy
Reference Point Session Dosage Given: 1.8 Gy
Session Number: 4

## 2022-06-09 LAB — COMPREHENSIVE METABOLIC PANEL
ALT: 13 U/L (ref 0–44)
AST: 20 U/L (ref 15–41)
Albumin: 3.3 g/dL — ABNORMAL LOW (ref 3.5–5.0)
Alkaline Phosphatase: 69 U/L (ref 38–126)
Anion gap: 9 (ref 5–15)
BUN: 21 mg/dL (ref 8–23)
CO2: 25 mmol/L (ref 22–32)
Calcium: 8.6 mg/dL — ABNORMAL LOW (ref 8.9–10.3)
Chloride: 104 mmol/L (ref 98–111)
Creatinine, Ser: 1 mg/dL (ref 0.44–1.00)
GFR, Estimated: 56 mL/min — ABNORMAL LOW (ref >60)
Glucose, Bld: 143 mg/dL — ABNORMAL HIGH (ref 70–99)
Potassium: 3.3 mmol/L — ABNORMAL LOW (ref 3.5–5.1)
Sodium: 138 mmol/L (ref 135–145)
Total Bilirubin: 0.3 mg/dL (ref 0.3–1.2)
Total Protein: 6.4 g/dL — ABNORMAL LOW (ref 6.5–8.1)

## 2022-06-09 NOTE — Progress Notes (Signed)
[place] NOTE  Patient Care Team: Remi Haggard, FNP as PCP - General (Family Medicine) Clent Jacks, RN as Oncology Nurse Navigator Jane Canary, MD as Consulting Physician (Oncology)   CANCER STAGING   Cancer Staging  Pancreatic cancer [facility]) Staging form: Exocrine Pancreas, AJCC 8th Edition - Clinical stage from 03/09/2022: Stage IB (cT2, cN0, cM0) - Signed by Heath Lark, MD on 03/09/2022 Stage prefix: Initial diagnosis Total positive nodes: 0   ASSESSMENT & PLAN:  Shirley Nichols 82 y.o. female with pmh of hypertension and hyperlipidemia was seen for localized pancreatic cancer.  #Localized pancreatic cancer - MRCP on 03/03/2022 showed a 3.8 cm pancreatic head mass and biliary dilatation.  She had ERCP with dilatation of upper and mid third main bile duct with obstructing mass and 1 metal stent was placed.  Brushings were positive for adenocarcinoma. CT chest negative for metastatic disease.  -Patient is not a surgical candidate due to poor functional status.  No chemotherapy due to functional status.  Also patient has been having memory issues for past few months.  Patient started on IMRT with plan for 54 Gray in 30 fractions on 06/06/2022.  Tolerating well.  -Foundation 1 CDX testing could not be performed due to insufficient sample.   -CA 19-9 908 from 04/17/22.  Repeat CA 19-9 today.  -Nutrition and palliative care on board.  # Hypokalemia -Potassium 3.3.  Patient will take 20 mEq packet daily for 2 days.  I provided patient instructions on foods that are rich in potassium.  Repeat BMP next week.  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  # Memory issues -Fluctuates.  Started few months ago.  Patient husband is very concerned about her not getting tested for UTI.  Patient does not have any urinary symptoms.  However due to their strong concern I will check UA. -Follows with neurology Dr.  Manuella Ghazi  Orders Placed This Encounter  Procedures   Urinalysis, Complete w Microscopic    Standing Status:   Future    Standing Expiration Date:   06/10/2023   CBC with Differential    Standing Status:   Future    Standing Expiration Date:   06/09/2023   Comprehensive metabolic panel    Standing Status:   Future    Standing Expiration Date:   06/09/2023   Cancer antigen 19-9    Standing Status:   Future    Standing Expiration Date:   4/40/3474   Basic metabolic panel    Standing Status:   Future    Standing Expiration Date:   06/09/2023   RTC in 1 week for UA check, BMP RTC in 6 weeks for MD visit, labs.  The total time spent in the appointment was 30 minutes encounter with patients including review of chart and various tests results, discussions about plan of care and coordination of care plan   All questions were answered. The patient knows to call the clinic with any problems, questions or concerns. No barriers to learning was detected.  Jane Canary, MD 1/12/202412:33 PM   HISTORY OF PRESENTING ILLNESS:  Shirley Nichols 82 y.o. female with pmh of hypertension and hyperlipidemia was seen for localized pancreatic cancer.  Patient was sent to ED by PCP in October 2023 for elevated liver enzymes and concern for biliary obstruction. She had MRCP on 03/03/2022 which showed a 3.8 cm pancreatic head mass and biliary dilatation.  She had ERCP with dilatation of upper and  mid third main bile duct with obstructing mass and 1 metal stent was placed.  Brushings were positive for adenocarcinoma.  Was complicated by post ERCP pancreatitis.  Her hospital course was prolonged by acute respiratory failure which required intubation.  There was suspicion for seizure and was started on Keppra and Dilantin by neurology.  MRI brain and LP was negative for malignancy.  CT chest was negative for metastatic disease.  She also had acute renal failure which improved with fluids.  Prior to hospitalization in  October, patient was functional and very independent with her ADLs.  She was driving and doing things.  Interval history Patient seen today accompanied by husband.  She continues to have improvement in her functional status.  She is able to do more activities around the home.  Eating better.  Denies any pain.  Husband reports patient having memory issues not able to remember things.  Can fluctuate.  I have reviewed her chart and materials related to her cancer extensively and collaborated history with the patient. Summary of oncologic history is as follows: Oncology History  Pancreatic cancer ([place])  03/03/2022 Imaging   CT abdomen and pelvis  Hazy stranding about the pancreatic head suggestive of pancreatitis. This is incompletely evaluated without IV contrast. Dilated gallbladder and common bile duct. MRI/MRCP is recommended to evaluate for choledocholithiasis or obstructing mass.   03/03/2022 Imaging   MRI  Image degradation by motion artifact noted.   3.4 cm masslike density in the pancreatic head, highly suspicious for pancreatic carcinoma.    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 No evidence of choledocholithiasis.   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   03/05/2022 Pathology Results   FINAL MICROSCOPIC DIAGNOSIS:  - Malignant cells consistent with adenocarcinoma    03/05/2022 Procedure   ERCP - The major papilla appeared normal. - The upper third of the main bile duct and middle third of the main bile duct were dilated, with a mass causing an obstruction. - A biliary sphincterotomy was performed. - One covered metal stent was placed into the common bile duct. - Cells for cytology obtained in the lower third of the main duct.   03/09/2022 Cancer Staging   Staging form: Exocrine Pancreas, AJCC 8th Edition - Clinical stage from 03/09/2022: Stage IB (cT2, cN0, cM0)  - Signed by Heath Lark, MD on 03/09/2022 Stage prefix: Initial diagnosis Total positive nodes: 0    Hospital Admission   Admit date: 04/17/2022 to 04/22/2022 Admission diagnosis: Generalized weakness and fatigue, poor p.o. intake. Additional comments: Repeat CT abdomen pelvis done for worsening transaminitis showed stable pancreatic mass and metallic stents in place.  No concerns for infection.  Venous Doppler showed right Baker's cyst.  Rehab on discharge declined by family.     MEDICAL HISTORY:  Past Medical History:  Diagnosis Date   Cancer ([place])    skin cancer   Hyperlipidemia    Hypertension     SURGICAL HISTORY: Past Surgical History:  Procedure Laterality Date   ABDOMINAL HYSTERECTOMY     BILIARY BRUSHING  03/05/2022   Procedure: BILIARY BRUSHING;  Surgeon: Carol Ada, MD;  Location: [facility] ENDOSCOPY;  Service: Gastroenterology;;   BILIARY STENT PLACEMENT  03/05/2022   Procedure: BILIARY STENT PLACEMENT;  Surgeon: Carol Ada, MD;  Location: [facility] ENDOSCOPY;  Service: Gastroenterology;;   COLONOSCOPY     02/21/2002 adenomatous polyp, 03/23/2011   COLONOSCOPY W/ POLYPECTOMY  02/2000  TA polyp removed from ascending colon.  performed through [facility] (? affiliate in [place])   COLONOSCOPY WITH PROPOFOL N/A 11/03/2016   Procedure: COLONOSCOPY WITH PROPOFOL;  Surgeon: Manya Silvas, MD;  Location: [facility] ENDOSCOPY;  Service: Endoscopy;  Laterality: N/A;   ERCP N/A 03/05/2022   Procedure: ENDOSCOPIC RETROGRADE CHOLANGIOPANCREATOGRAPHY (ERCP);  Surgeon: Carol Ada, MD;  Location: [place];  Service: Gastroenterology;  Laterality: N/A;   HEMORROIDECTOMY     TONSILLECTOMY      SOCIAL HISTORY: Social History   Socioeconomic History   Marital status: Married    Spouse name: Not on file   Number of children: Not on file   Years of education: Not on file   Highest education level: Not on file  Occupational History   Not on file  Tobacco Use   Smoking status:  Former   Smokeless tobacco: Never  Substance and Sexual Activity   Alcohol use: No   Drug use: No   Sexual activity: Yes  Other Topics Concern   Not on file  Social History Narrative   Not on file   Social Determinants of Health   Financial Resource Strain: Not on file  Food Insecurity: No Food Insecurity (04/17/2022)   Hunger Vital Sign    Worried About Running Out of Food in the Last Year: Never true    Ran Out of Food in the Last Year: Never true  Transportation Needs: No Transportation Needs (04/17/2022)   PRAPARE - Hydrologist (Medical): No    Lack of Transportation (Non-Medical): No  Physical Activity: Not on file  Stress: Not on file  Social Connections: Not on file  Intimate Partner Violence: Not At Risk (04/17/2022)   Humiliation, Afraid, Rape, and Kick questionnaire    Fear of Current or Ex-Partner: No    Emotionally Abused: No    Physically Abused: No    Sexually Abused: No    FAMILY HISTORY: Family History  Problem Relation Age of Onset   Seizures Sister     ALLERGIES:  has No Known Allergies.  MEDICATIONS:  Current Outpatient Medications  Medication Sig Dispense Refill   acetaminophen (TYLENOL) 325 MG tablet Take 2 tablets (650 mg total) by mouth every 6 (six) hours as needed for mild pain, fever or headache.     aspirin EC 81 MG tablet Take 81 mg by mouth daily.     magnesium oxide (MAG-OX) 400 MG tablet Take 2 tablets (800 mg total) by mouth 2 (two) times daily. 120 tablet 0   SYMBICORT 160-4.5 MCG/ACT inhaler Inhale 2 puffs into the lungs as needed (for wheezing or shortness of breath). 10.2 g 4   megestrol (MEGACE) 40 MG/ML suspension Take 400 mg by mouth daily. (Patient not taking: Reported on 05/25/2022)     No current facility-administered medications for this visit.    REVIEW OF SYSTEMS:   Pertinent information mentioned in HPI All other systems were reviewed with the patient and are negative.  PHYSICAL  EXAMINATION: ECOG PERFORMANCE STATUS: 3 - Symptomatic, >50% confined to bed  Vitals:   06/09/22 1037  BP: (!) 151/68  Pulse: 77  Resp: 20  Temp: 98.6 F (37 C)  SpO2: 100%     Filed Weights   06/09/22 1037  Weight: 119 lb 1.6 oz (54 kg)      GENERAL:alert, no distress and comfortable SKIN: skin color, texture, turgor are normal, no rashes or significant lesions EYES: normal, conjunctiva are pink and non-injected, sclera clear  OROPHARYNX:no exudate, no erythema and lips, buccal mucosa, and tongue normal  NECK: supple, thyroid normal size, non-tender, without nodularity LYMPH:  no palpable lymphadenopathy in the cervical, axillary or inguinal LUNGS: clear to auscultation and percussion with normal breathing effort HEART: regular rate & rhythm and no murmurs and no lower extremity edema ABDOMEN:abdomen soft, non-tender and normal bowel sounds Musculoskeletal:no cyanosis of digits and no clubbing  PSYCH: alert & oriented x 3 with fluent speech NEURO: no focal motor/sensory deficits  LABORATORY DATA:  I have reviewed the data as listed Lab Results  Component Value Date   WBC 8.1 06/09/2022   HGB 8.9 (L) 06/09/2022   HCT 28.5 (L) 06/09/2022   MCV 83.3 06/09/2022   PLT 294 06/09/2022   Recent Labs    03/14/22 1430 03/14/22 1715 05/11/22 1439 05/17/22 1303 05/25/22 1338 06/09/22 1013  NA  --    < > 140 138 140 138  K  --    < > 2.7* 5.3* 3.6 3.3*  CL  --    < > 102 108 108 104  CO2  --    < > '27 25 23 25  '$ GLUCOSE  --    < > 129* 139* 112* 143*  BUN  --    < > '22 17 21 21  '$ CREATININE  --    < > 1.07* 0.94 1.02* 1.00  CALCIUM  --    < > 8.2* 8.8* 8.8* 8.6*  GFRNONAA  --    < > 52* >60 55* 56*  PROT  --    < > 6.5  --  7.1 6.4*  ALBUMIN  --    < > 2.4*  --  3.3* 3.3*  AST  --    < > 15  --  16 20  ALT  --    < > 13  --  13 13  ALKPHOS  --    < > 75  --  76 69  BILITOT  --    < > 0.4  --  0.4 0.3  BILIDIR 1.9*  --   --   --   --   --    < > = values in this  interval not displayed.    RADIOGRAPHIC STUDIES: I have personally reviewed the radiological images as listed and agreed with the findings in the report. No results found.

## 2022-06-09 NOTE — Patient Instructions (Signed)
Potassium Content of Foods  Potassium is a mineral found in many foods and drinks. It can affect how the heart works, affect blood pressure, and keep fluids and electrolytes balanced in the body. It is important not to have too much potassium (hyperkalemia) or too little potassium (hypokalemia) in the body, especially in the blood. Potassium is naturally found in many different types of whole foods, such as fruits, vegetables, meat, and dairy products. Processed foods tend to be lower in potassium. The amount of potassium you need each day depends on your age and any medical conditions you may have. General recommendations are: Females aged 19 and older: 2,600 mg per day. Males aged 19 and older: 3,400 mg per day. Talk with your health care provider or dietitian about how much potassium you need. What foods are high in potassium? Below are examples of foods that have greater than 200 mg of potassium per serving. Fruits Orange -- 1 medium (130 g) has 230 mg of potassium. Banana -- 1 medium (120 g) has 420 mg of potassium. Cantaloupe, chunks -- 1 cup (160 g) has 430 mg of potassium. Vegetables Potato, baked, without skin -- 1 medium (170 g) has 600 mg of potassium. Broccoli, chopped, cooked --  cup (77.5 g) has 230 mg of potassium. Tomato, chopped or sliced -- 1 cup (152 g) has 400 mg of potassium. Grains Cereal, bran with raisins -- 1 cup (59 g) has 360 mg of potassium. Granola with almonds --  cup (82 g) has 220 mg of potassium. Meats and other proteins Ground beef patty -- 4 ounces (113 g) has 240 mg of potassium. Kidney beans, boiled --  cup (130 g) has 350 mg of potassium. Almonds -- 1 ounce (approximately 22 nuts or 28 g) has 200 mg of potassium. Dairy Cow's milk, 1% -- 1 cup (237 mL) has 360 mg of potassium. Plain vanilla low-fat yogurt --  cup (184 g) has 220 mg of potassium. The items listed above may not be a complete list of foods high in potassium. Actual amounts of potassium  may be different depending on ripeness, shelf life, and food preparation. Contact a dietitian for more information. What foods are low in potassium? Below are examples of foods that have less than 200 mg of potassium per serving. Fruits Blueberries -- 1 cup (145 g) has 110 mg of potassium. Apple -- 1 medium (140 g) has 145 mg of potassium. Grapes -- 1 cup (160 g) has 175 mg of potassium. Vegetables Cabbage, raw -- 1 cup (70 g) has 120 mg of potassium. Cauliflower, chopped, cooked -- 1 cup (180 g) has 90 mg of potassium. Romaine lettuce, chopped -- 1 cup (56 g) has 120 mg of potassium. Grains Bagel, plain -- one 4-inch (10 cm) has 100 mg of potassium. Whole wheat bread -- 1 slice (26 g) has 70 mg of potassium. White rice, cooked -- 1 cup (163 g) has 50 mg of potassium. Meats and other proteins Tuna, light, canned in water -- 3 ounces (85 g) has 150 mg of potassium. Egg, fried -- 1 large (50 g) has 60 mg of potassium. Peanuts --1 ounce (35 nuts or 28 g) has 180 mg of potassium. Tofu --  cup (252 g) has 150 mg of potassium. Dairy Cheese (cheddar, colby, mozzarella, or provolone) -- 1 ounce (28 g) has 30 to 40 mg of potassium. The items listed above may not be a complete list of foods that are low in potassium. Actual amounts of   potassium may be different depending on ripeness, shelf life, and food preparation. Contact a dietitian for more information. Summary Potassium is a mineral found in many foods and drinks. It affects how the heart works, affects blood pressure, and keeps fluids and electrolytes balanced in the body. The amount of potassium you need each day depends on your age and any existing medical conditions you may have. Your health care provider or dietitian may recommend an amount of potassium that you should have each day. This information is not intended to replace advice given to you by your health care provider. Make sure you discuss any questions you have with your health  care provider. Document Revised: 02/15/2021 Document Reviewed: 01/27/2021 Elsevier Patient Education  2023 Elsevier Inc.  

## 2022-06-12 ENCOUNTER — Other Ambulatory Visit: Payer: Self-pay

## 2022-06-12 ENCOUNTER — Ambulatory Visit
Admission: RE | Admit: 2022-06-12 | Discharge: 2022-06-12 | Disposition: A | Discharge status: Home / Self Care | Payer: PPO | Source: Ambulatory Visit | Attending: Radiation Oncology | Admitting: Radiation Oncology

## 2022-06-12 DIAGNOSIS — C250 Malignant neoplasm of head of pancreas: Secondary | ICD-10-CM | POA: Diagnosis not present

## 2022-06-12 LAB — RAD ONC ARIA SESSION SUMMARY
Course Elapsed Days: 6
Plan Fractions Treated to Date: 5
Plan Prescribed Dose Per Fraction: 1.8 Gy
Plan Total Fractions Prescribed: 30
Plan Total Prescribed Dose: 54 Gy
Reference Point Dosage Given to Date: 9 Gy
Reference Point Session Dosage Given: 1.8 Gy
Session Number: 5

## 2022-06-13 ENCOUNTER — Other Ambulatory Visit: Payer: Self-pay

## 2022-06-13 ENCOUNTER — Ambulatory Visit
Admission: RE | Admit: 2022-06-13 | Discharge: 2022-06-13 | Disposition: A | Discharge status: Home / Self Care | Payer: PPO | Source: Ambulatory Visit | Attending: Radiation Oncology | Admitting: Radiation Oncology

## 2022-06-13 DIAGNOSIS — C250 Malignant neoplasm of head of pancreas: Secondary | ICD-10-CM | POA: Diagnosis not present

## 2022-06-13 LAB — RAD ONC ARIA SESSION SUMMARY
Course Elapsed Days: 7
Plan Fractions Treated to Date: 6
Plan Prescribed Dose Per Fraction: 1.8 Gy
Plan Total Fractions Prescribed: 30
Plan Total Prescribed Dose: 54 Gy
Reference Point Dosage Given to Date: 10.8 Gy
Reference Point Session Dosage Given: 1.8 Gy
Session Number: 6

## 2022-06-13 LAB — CANCER ANTIGEN 19-9: CA 19-9: 1620 U/mL — ABNORMAL HIGH (ref 0–35)

## 2022-06-14 ENCOUNTER — Inpatient Hospital Stay: Payer: PPO

## 2022-06-14 ENCOUNTER — Ambulatory Visit
Admission: RE | Admit: 2022-06-14 | Discharge: 2022-06-14 | Disposition: A | Discharge status: Home / Self Care | Payer: PPO | Source: Ambulatory Visit | Attending: Radiation Oncology | Admitting: Radiation Oncology

## 2022-06-14 ENCOUNTER — Other Ambulatory Visit: Payer: Self-pay

## 2022-06-14 DIAGNOSIS — C250 Malignant neoplasm of head of pancreas: Secondary | ICD-10-CM | POA: Diagnosis not present

## 2022-06-14 DIAGNOSIS — E876 Hypokalemia: Secondary | ICD-10-CM

## 2022-06-14 DIAGNOSIS — R410 Disorientation, unspecified: Secondary | ICD-10-CM

## 2022-06-14 LAB — URINALYSIS, COMPLETE (UACMP) WITH MICROSCOPIC
Bilirubin Urine: NEGATIVE
Glucose, UA: NEGATIVE mg/dL
Hgb urine dipstick: NEGATIVE
Ketones, ur: NEGATIVE mg/dL
Nitrite: NEGATIVE
Protein, ur: 30 mg/dL — AB
Specific Gravity, Urine: 1.017 (ref 1.005–1.030)
pH: 5 (ref 5.0–8.0)

## 2022-06-14 LAB — RAD ONC ARIA SESSION SUMMARY
Course Elapsed Days: 8
Plan Fractions Treated to Date: 7
Plan Prescribed Dose Per Fraction: 1.8 Gy
Plan Total Fractions Prescribed: 30
Plan Total Prescribed Dose: 54 Gy
Reference Point Dosage Given to Date: 12.6 Gy
Reference Point Session Dosage Given: 1.8 Gy
Session Number: 7

## 2022-06-14 LAB — CBC WITH DIFFERENTIAL/PLATELET
Abs Immature Granulocytes: 0.04 10*3/uL (ref 0.00–0.07)
Basophils Absolute: 0 10*3/uL (ref 0.0–0.1)
Basophils Relative: 1 %
Eosinophils Absolute: 0.2 10*3/uL (ref 0.0–0.5)
Eosinophils Relative: 3 %
HCT: 30.2 % — ABNORMAL LOW (ref 36.0–46.0)
Hemoglobin: 9.2 g/dL — ABNORMAL LOW (ref 12.0–15.0)
Immature Granulocytes: 1 %
Lymphocytes Relative: 21 %
Lymphs Abs: 1.6 10*3/uL (ref 0.7–4.0)
MCH: 26.1 pg (ref 26.0–34.0)
MCHC: 30.5 g/dL (ref 30.0–36.0)
MCV: 85.6 fL (ref 80.0–100.0)
Monocytes Absolute: 0.5 10*3/uL (ref 0.1–1.0)
Monocytes Relative: 6 %
Neutro Abs: 5.3 10*3/uL (ref 1.7–7.7)
Neutrophils Relative %: 68 %
Platelets: 298 10*3/uL (ref 150–400)
RBC: 3.53 MIL/uL — ABNORMAL LOW (ref 3.87–5.11)
RDW: 17.4 % — ABNORMAL HIGH (ref 11.5–15.5)
WBC: 7.6 10*3/uL (ref 4.0–10.5)
nRBC: 0 % (ref 0.0–0.2)

## 2022-06-14 LAB — BASIC METABOLIC PANEL
Anion gap: 9 (ref 5–15)
BUN: 19 mg/dL (ref 8–23)
CO2: 23 mmol/L (ref 22–32)
Calcium: 8.6 mg/dL — ABNORMAL LOW (ref 8.9–10.3)
Chloride: 104 mmol/L (ref 98–111)
Creatinine, Ser: 0.88 mg/dL (ref 0.44–1.00)
GFR, Estimated: >60 mL/min (ref >60)
Glucose, Bld: 101 mg/dL — ABNORMAL HIGH (ref 70–99)
Potassium: 3.6 mmol/L (ref 3.5–5.1)
Sodium: 136 mmol/L (ref 135–145)

## 2022-06-14 NOTE — Progress Notes (Signed)
Nutrition Follow-up:   Patient with pancreatic cancer.  Patient has started radiation therapy on 1/9.    Spoke with husband via phone for follow-up.  Patient's appetite continues to be good. She is eating 3 meals a day with snacks. Drinking ensure shakes.  She had homemade biscuits, sausage, egg and jelly for breakfast this am.  Patient is more active with preparing meals, washing clothes and other household chores.  Patient has not had  any side effects from radiation at this point per husband.  Currently not planning chemotherapy.      Medications: reviewed  Labs: reviewed  Anthropometrics:   Weight 119 lb 1.6 oz on 1/12 119 lb on 12/21 131 lb on 13.4 oz on 10/26 145 lb on 03/03/22   NUTRITION DIAGNOSIS: Inadequate oral intake improved    INTERVENTION:  Continue frequent meals/snacks Continue high calorie, high protein foods Continue oral nutrition supplement Husband to contact RD if something changes in nutrition.  Husband has contact information    MONITORING, EVALUATION, GOAL: weight trends, intake   NEXT VISIT: no follow-up Husband to contact RD if needed  Angelino Rumery B. Zenia Resides, [place], [place] Registered Dietitian [phone]

## 2022-06-15 ENCOUNTER — Other Ambulatory Visit: Payer: Self-pay

## 2022-06-15 ENCOUNTER — Ambulatory Visit
Admission: RE | Admit: 2022-06-15 | Discharge: 2022-06-15 | Disposition: A | Discharge status: Home / Self Care | Payer: PPO | Source: Ambulatory Visit | Attending: Radiation Oncology | Admitting: Radiation Oncology

## 2022-06-15 DIAGNOSIS — C250 Malignant neoplasm of head of pancreas: Secondary | ICD-10-CM | POA: Diagnosis not present

## 2022-06-15 LAB — RAD ONC ARIA SESSION SUMMARY
Course Elapsed Days: 9
Plan Fractions Treated to Date: 8
Plan Prescribed Dose Per Fraction: 1.8 Gy
Plan Total Fractions Prescribed: 30
Plan Total Prescribed Dose: 54 Gy
Reference Point Dosage Given to Date: 14.4 Gy
Reference Point Session Dosage Given: 1.8 Gy
Session Number: 8

## 2022-06-16 ENCOUNTER — Ambulatory Visit
Admission: RE | Admit: 2022-06-16 | Discharge: 2022-06-16 | Disposition: A | Discharge status: Home / Self Care | Payer: PPO | Source: Ambulatory Visit | Attending: Radiation Oncology | Admitting: Radiation Oncology

## 2022-06-16 ENCOUNTER — Other Ambulatory Visit: Payer: Self-pay

## 2022-06-16 DIAGNOSIS — C250 Malignant neoplasm of head of pancreas: Secondary | ICD-10-CM | POA: Diagnosis not present

## 2022-06-16 LAB — RAD ONC ARIA SESSION SUMMARY
Course Elapsed Days: 10
Plan Fractions Treated to Date: 9
Plan Prescribed Dose Per Fraction: 1.8 Gy
Plan Total Fractions Prescribed: 30
Plan Total Prescribed Dose: 54 Gy
Reference Point Dosage Given to Date: 16.2 Gy
Reference Point Session Dosage Given: 1.8 Gy
Session Number: 9

## 2022-06-19 ENCOUNTER — Other Ambulatory Visit: Payer: Self-pay

## 2022-06-19 ENCOUNTER — Ambulatory Visit
Admission: RE | Admit: 2022-06-19 | Discharge: 2022-06-19 | Disposition: A | Discharge status: Home / Self Care | Payer: PPO | Source: Ambulatory Visit | Attending: Radiation Oncology | Admitting: Radiation Oncology

## 2022-06-19 DIAGNOSIS — C250 Malignant neoplasm of head of pancreas: Secondary | ICD-10-CM | POA: Diagnosis not present

## 2022-06-19 LAB — RAD ONC ARIA SESSION SUMMARY
Course Elapsed Days: 13
Plan Fractions Treated to Date: 10
Plan Prescribed Dose Per Fraction: 1.8 Gy
Plan Total Fractions Prescribed: 30
Plan Total Prescribed Dose: 54 Gy
Reference Point Dosage Given to Date: 18 Gy
Reference Point Session Dosage Given: 1.8 Gy
Session Number: 10

## 2022-06-20 ENCOUNTER — Other Ambulatory Visit: Payer: Self-pay

## 2022-06-20 ENCOUNTER — Ambulatory Visit
Admission: RE | Admit: 2022-06-20 | Discharge: 2022-06-20 | Disposition: A | Discharge status: Home / Self Care | Payer: PPO | Source: Ambulatory Visit | Attending: Radiation Oncology | Admitting: Radiation Oncology

## 2022-06-20 DIAGNOSIS — C250 Malignant neoplasm of head of pancreas: Secondary | ICD-10-CM | POA: Diagnosis not present

## 2022-06-20 LAB — RAD ONC ARIA SESSION SUMMARY
Course Elapsed Days: 14
Plan Fractions Treated to Date: 11
Plan Prescribed Dose Per Fraction: 1.8 Gy
Plan Total Fractions Prescribed: 30
Plan Total Prescribed Dose: 54 Gy
Reference Point Dosage Given to Date: 19.8 Gy
Reference Point Session Dosage Given: 1.8 Gy
Session Number: 11

## 2022-06-21 ENCOUNTER — Ambulatory Visit
Admission: RE | Admit: 2022-06-21 | Discharge: 2022-06-21 | Disposition: A | Discharge status: Home / Self Care | Payer: PPO | Source: Ambulatory Visit | Attending: Radiation Oncology | Admitting: Radiation Oncology

## 2022-06-21 ENCOUNTER — Other Ambulatory Visit: Payer: Self-pay

## 2022-06-21 DIAGNOSIS — C250 Malignant neoplasm of head of pancreas: Secondary | ICD-10-CM | POA: Diagnosis not present

## 2022-06-21 LAB — RAD ONC ARIA SESSION SUMMARY
Course Elapsed Days: 15
Plan Fractions Treated to Date: 12
Plan Prescribed Dose Per Fraction: 1.8 Gy
Plan Total Fractions Prescribed: 30
Plan Total Prescribed Dose: 54 Gy
Reference Point Dosage Given to Date: 21.6 Gy
Reference Point Session Dosage Given: 1.8 Gy
Session Number: 12

## 2022-06-22 ENCOUNTER — Ambulatory Visit
Admission: RE | Admit: 2022-06-22 | Discharge: 2022-06-22 | Disposition: A | Discharge status: Home / Self Care | Payer: PPO | Source: Ambulatory Visit | Attending: Radiation Oncology | Admitting: Radiation Oncology

## 2022-06-22 ENCOUNTER — Inpatient Hospital Stay (HOSPITAL_BASED_OUTPATIENT_CLINIC_OR_DEPARTMENT_OTHER): Payer: PPO | Admitting: Hospice and Palliative Medicine

## 2022-06-22 ENCOUNTER — Other Ambulatory Visit: Payer: Self-pay

## 2022-06-22 DIAGNOSIS — C250 Malignant neoplasm of head of pancreas: Secondary | ICD-10-CM | POA: Diagnosis not present

## 2022-06-22 DIAGNOSIS — Z515 Encounter for palliative care: Secondary | ICD-10-CM

## 2022-06-22 LAB — RAD ONC ARIA SESSION SUMMARY
Course Elapsed Days: 16
Plan Fractions Treated to Date: 13
Plan Prescribed Dose Per Fraction: 1.8 Gy
Plan Total Fractions Prescribed: 30
Plan Total Prescribed Dose: 54 Gy
Reference Point Dosage Given to Date: 23.4 Gy
Reference Point Session Dosage Given: 1.8 Gy
Session Number: 13

## 2022-06-22 NOTE — Progress Notes (Signed)
Virtual Visit via Telephone Note  I connected with Shirley Nichols on 06/22/22 at  1:30 PM EST by telephone and verified that I am speaking with the correct person using two identifiers.  Location: Patient: Home Provider: Clinic   I discussed the limitations, risks, security and privacy concerns of performing an evaluation and management service by telephone and the availability of in person appointments. I also discussed with the patient that there may be a patient responsible charge related to this service. The patient expressed understanding and agreed to proceed.   History of Present Illness: Shirley Nichols is a 82 y.o. female with multiple medical problems including including newly diagnosed pancreatic cancer.  Patient was hospitalized 03/03/2022 to 03/31/2022 for evaluation of elevated LFTs. MRCP revealed a localized mass in the head of the pancreas with CBD stricture and diffuse biliary ductal dilation.  Patient was transferred to [facility] and underwent ERCP on 03/05/2022 with sphincterotomy and biliary stent placement.  Brush biopsy positive for pancreatic adenocarcinoma.  Hospitalization was also complicated by metabolic encephalopathy and questionable seizures requiring intubation.  Patient has been evaluated by hepatobiliary surgeon but she is not felt to be a surgical candidate.  Patient was hospitalized 04/17/2022 to 04/22/2022 with progressive weakness and ultimately improved with supportive care.  Palliative care was consulted to help address goals.    Observations/Objective: Spoke with patient and husband by phone.  Reportedly, patient is doing reasonably well.  She denies any acute symptoms or concerns.  Husband verbalized some concern about memory impairment/confusion over the past month or so.  Patient has scheduled follow-up with neurology.  Reportedly, appetite has improved.  Patient says she is "eating too much."  Assessment and Plan: Pancreatic Cancer -on XRT.   Symptomatically appears to be doing reasonably well.   Follow Up Instructions: Telephone visit 1-2 months   I discussed the assessment and treatment plan with the patient. The patient was provided an opportunity to ask questions and all were answered. The patient agreed with the plan and demonstrated an understanding of the instructions.   The patient was advised to call back or seek an in-person evaluation if the symptoms worsen or if the condition fails to improve as anticipated.  I provided 15 minutes of non-face-to-face time during this encounter.   Irean Hong, NP

## 2022-06-23 ENCOUNTER — Other Ambulatory Visit: Payer: Self-pay

## 2022-06-23 ENCOUNTER — Ambulatory Visit
Admission: RE | Admit: 2022-06-23 | Discharge: 2022-06-23 | Disposition: A | Discharge status: Home / Self Care | Payer: PPO | Source: Ambulatory Visit | Attending: Radiation Oncology | Admitting: Radiation Oncology

## 2022-06-23 DIAGNOSIS — C250 Malignant neoplasm of head of pancreas: Secondary | ICD-10-CM | POA: Diagnosis not present

## 2022-06-23 LAB — RAD ONC ARIA SESSION SUMMARY
Course Elapsed Days: 17
Plan Fractions Treated to Date: 14
Plan Prescribed Dose Per Fraction: 1.8 Gy
Plan Total Fractions Prescribed: 30
Plan Total Prescribed Dose: 54 Gy
Reference Point Dosage Given to Date: 25.2 Gy
Reference Point Session Dosage Given: 1.8 Gy
Session Number: 14

## 2022-06-26 ENCOUNTER — Other Ambulatory Visit (HOSPITAL_COMMUNITY): Payer: Self-pay

## 2022-06-26 ENCOUNTER — Other Ambulatory Visit: Payer: Self-pay

## 2022-06-26 ENCOUNTER — Ambulatory Visit
Admission: RE | Admit: 2022-06-26 | Discharge: 2022-06-26 | Disposition: A | Discharge status: Home / Self Care | Payer: PPO | Source: Ambulatory Visit | Attending: Radiation Oncology | Admitting: Radiation Oncology

## 2022-06-26 DIAGNOSIS — C250 Malignant neoplasm of head of pancreas: Secondary | ICD-10-CM | POA: Diagnosis not present

## 2022-06-26 LAB — RAD ONC ARIA SESSION SUMMARY
Course Elapsed Days: 20
Plan Fractions Treated to Date: 15
Plan Prescribed Dose Per Fraction: 1.8 Gy
Plan Total Fractions Prescribed: 30
Plan Total Prescribed Dose: 54 Gy
Reference Point Dosage Given to Date: 27 Gy
Reference Point Session Dosage Given: 1.8 Gy
Session Number: 15

## 2022-06-27 ENCOUNTER — Other Ambulatory Visit: Payer: Self-pay

## 2022-06-27 ENCOUNTER — Ambulatory Visit
Admission: RE | Admit: 2022-06-27 | Discharge: 2022-06-27 | Disposition: A | Discharge status: Home / Self Care | Payer: PPO | Source: Ambulatory Visit | Attending: Radiation Oncology | Admitting: Radiation Oncology

## 2022-06-27 DIAGNOSIS — C250 Malignant neoplasm of head of pancreas: Secondary | ICD-10-CM | POA: Diagnosis not present

## 2022-06-27 LAB — RAD ONC ARIA SESSION SUMMARY
Course Elapsed Days: 21
Plan Fractions Treated to Date: 16
Plan Prescribed Dose Per Fraction: 1.8 Gy
Plan Total Fractions Prescribed: 30
Plan Total Prescribed Dose: 54 Gy
Reference Point Dosage Given to Date: 28.8 Gy
Reference Point Session Dosage Given: 1.8 Gy
Session Number: 16

## 2022-06-28 ENCOUNTER — Other Ambulatory Visit: Payer: Self-pay

## 2022-06-28 ENCOUNTER — Ambulatory Visit
Admission: RE | Admit: 2022-06-28 | Discharge: 2022-06-28 | Disposition: A | Discharge status: Home / Self Care | Payer: PPO | Source: Ambulatory Visit | Attending: Radiation Oncology | Admitting: Radiation Oncology

## 2022-06-28 DIAGNOSIS — C250 Malignant neoplasm of head of pancreas: Secondary | ICD-10-CM | POA: Diagnosis not present

## 2022-06-28 LAB — RAD ONC ARIA SESSION SUMMARY
Course Elapsed Days: 22
Plan Fractions Treated to Date: 17
Plan Prescribed Dose Per Fraction: 1.8 Gy
Plan Total Fractions Prescribed: 30
Plan Total Prescribed Dose: 54 Gy
Reference Point Dosage Given to Date: 30.6 Gy
Reference Point Session Dosage Given: 1.8 Gy
Session Number: 17

## 2022-06-29 ENCOUNTER — Ambulatory Visit
Admission: RE | Admit: 2022-06-29 | Discharge: 2022-06-29 | Disposition: A | Discharge status: Home / Self Care | Payer: PPO | Source: Ambulatory Visit | Attending: Radiation Oncology | Admitting: Radiation Oncology

## 2022-06-29 ENCOUNTER — Other Ambulatory Visit: Payer: Self-pay

## 2022-06-29 DIAGNOSIS — C250 Malignant neoplasm of head of pancreas: Secondary | ICD-10-CM | POA: Diagnosis present

## 2022-06-29 LAB — RAD ONC ARIA SESSION SUMMARY
Course Elapsed Days: 23
Plan Fractions Treated to Date: 18
Plan Prescribed Dose Per Fraction: 1.8 Gy
Plan Total Fractions Prescribed: 30
Plan Total Prescribed Dose: 54 Gy
Reference Point Dosage Given to Date: 32.4 Gy
Reference Point Session Dosage Given: 1.8 Gy
Session Number: 18

## 2022-06-30 ENCOUNTER — Other Ambulatory Visit (HOSPITAL_COMMUNITY): Payer: Self-pay

## 2022-06-30 ENCOUNTER — Ambulatory Visit
Admission: RE | Admit: 2022-06-30 | Discharge: 2022-06-30 | Disposition: A | Discharge status: Home / Self Care | Payer: PPO | Source: Ambulatory Visit | Attending: Radiation Oncology | Admitting: Radiation Oncology

## 2022-06-30 ENCOUNTER — Other Ambulatory Visit: Payer: Self-pay

## 2022-06-30 DIAGNOSIS — C250 Malignant neoplasm of head of pancreas: Secondary | ICD-10-CM | POA: Diagnosis not present

## 2022-06-30 LAB — RAD ONC ARIA SESSION SUMMARY
Course Elapsed Days: 24
Plan Fractions Treated to Date: 19
Plan Prescribed Dose Per Fraction: 1.8 Gy
Plan Total Fractions Prescribed: 30
Plan Total Prescribed Dose: 54 Gy
Reference Point Dosage Given to Date: 34.2 Gy
Reference Point Session Dosage Given: 1.8 Gy
Session Number: 19

## 2022-06-30 MED ORDER — DONEPEZIL HCL 5 MG PO TABS
5.0000 mg | ORAL_TABLET | Freq: Every morning | ORAL | 5 refills | Status: DC
  Filled 2022-06-30 (×2): qty 30, 30d supply, fill #0
  Filled 2022-07-25: qty 30, 30d supply, fill #1
  Filled 2022-08-22: qty 30, 30d supply, fill #2

## 2022-07-03 ENCOUNTER — Ambulatory Visit
Admission: RE | Admit: 2022-07-03 | Discharge: 2022-07-03 | Disposition: A | Discharge status: Home / Self Care | Payer: PPO | Source: Ambulatory Visit | Attending: Radiation Oncology | Admitting: Radiation Oncology

## 2022-07-03 ENCOUNTER — Other Ambulatory Visit: Payer: Self-pay

## 2022-07-03 DIAGNOSIS — C250 Malignant neoplasm of head of pancreas: Secondary | ICD-10-CM | POA: Diagnosis not present

## 2022-07-03 LAB — RAD ONC ARIA SESSION SUMMARY
Course Elapsed Days: 27
Plan Fractions Treated to Date: 20
Plan Prescribed Dose Per Fraction: 1.8 Gy
Plan Total Fractions Prescribed: 30
Plan Total Prescribed Dose: 54 Gy
Reference Point Dosage Given to Date: 36 Gy
Reference Point Session Dosage Given: 1.8 Gy
Session Number: 20

## 2022-07-04 ENCOUNTER — Ambulatory Visit
Admission: RE | Admit: 2022-07-04 | Discharge: 2022-07-04 | Disposition: A | Discharge status: Home / Self Care | Payer: PPO | Source: Ambulatory Visit | Attending: Radiation Oncology | Admitting: Radiation Oncology

## 2022-07-04 ENCOUNTER — Other Ambulatory Visit: Payer: Self-pay

## 2022-07-04 DIAGNOSIS — C250 Malignant neoplasm of head of pancreas: Secondary | ICD-10-CM | POA: Diagnosis not present

## 2022-07-04 LAB — RAD ONC ARIA SESSION SUMMARY
Course Elapsed Days: 28
Plan Fractions Treated to Date: 21
Plan Prescribed Dose Per Fraction: 1.8 Gy
Plan Total Fractions Prescribed: 30
Plan Total Prescribed Dose: 54 Gy
Reference Point Dosage Given to Date: 37.8 Gy
Reference Point Session Dosage Given: 1.8 Gy
Session Number: 21

## 2022-07-05 ENCOUNTER — Other Ambulatory Visit: Payer: Self-pay

## 2022-07-05 ENCOUNTER — Ambulatory Visit
Admission: RE | Admit: 2022-07-05 | Discharge: 2022-07-05 | Disposition: A | Discharge status: Home / Self Care | Payer: PPO | Source: Ambulatory Visit | Attending: Radiation Oncology | Admitting: Radiation Oncology

## 2022-07-05 DIAGNOSIS — C250 Malignant neoplasm of head of pancreas: Secondary | ICD-10-CM | POA: Diagnosis not present

## 2022-07-05 LAB — RAD ONC ARIA SESSION SUMMARY
Course Elapsed Days: 29
Plan Fractions Treated to Date: 22
Plan Prescribed Dose Per Fraction: 1.8 Gy
Plan Total Fractions Prescribed: 30
Plan Total Prescribed Dose: 54 Gy
Reference Point Dosage Given to Date: 39.6 Gy
Reference Point Session Dosage Given: 1.8 Gy
Session Number: 22

## 2022-07-06 ENCOUNTER — Ambulatory Visit
Admission: RE | Admit: 2022-07-06 | Discharge: 2022-07-06 | Disposition: A | Discharge status: Home / Self Care | Payer: PPO | Source: Ambulatory Visit | Attending: Radiation Oncology | Admitting: Radiation Oncology

## 2022-07-06 ENCOUNTER — Other Ambulatory Visit: Payer: Self-pay

## 2022-07-06 DIAGNOSIS — C250 Malignant neoplasm of head of pancreas: Secondary | ICD-10-CM | POA: Diagnosis not present

## 2022-07-06 LAB — RAD ONC ARIA SESSION SUMMARY
Course Elapsed Days: 30
Plan Fractions Treated to Date: 23
Plan Prescribed Dose Per Fraction: 1.8 Gy
Plan Total Fractions Prescribed: 30
Plan Total Prescribed Dose: 54 Gy
Reference Point Dosage Given to Date: 41.4 Gy
Reference Point Session Dosage Given: 1.8 Gy
Session Number: 23

## 2022-07-07 ENCOUNTER — Other Ambulatory Visit: Payer: Self-pay

## 2022-07-07 ENCOUNTER — Ambulatory Visit
Admission: RE | Admit: 2022-07-07 | Discharge: 2022-07-07 | Disposition: A | Discharge status: Home / Self Care | Payer: PPO | Source: Ambulatory Visit | Attending: Radiation Oncology | Admitting: Radiation Oncology

## 2022-07-07 DIAGNOSIS — C250 Malignant neoplasm of head of pancreas: Secondary | ICD-10-CM | POA: Diagnosis not present

## 2022-07-07 LAB — RAD ONC ARIA SESSION SUMMARY
Course Elapsed Days: 31
Plan Fractions Treated to Date: 24
Plan Prescribed Dose Per Fraction: 1.8 Gy
Plan Total Fractions Prescribed: 30
Plan Total Prescribed Dose: 54 Gy
Reference Point Dosage Given to Date: 43.2 Gy
Reference Point Session Dosage Given: 1.8 Gy
Session Number: 24

## 2022-07-10 ENCOUNTER — Other Ambulatory Visit: Payer: Self-pay

## 2022-07-10 ENCOUNTER — Ambulatory Visit: Admission: RE | Admit: 2022-07-10 | Payer: PPO | Source: Ambulatory Visit

## 2022-07-10 ENCOUNTER — Ambulatory Visit: Payer: PPO

## 2022-07-10 ENCOUNTER — Observation Stay: Payer: PPO

## 2022-07-10 ENCOUNTER — Emergency Department: Payer: PPO

## 2022-07-10 ENCOUNTER — Observation Stay
Admission: EM | Admit: 2022-07-10 | Discharge: 2022-07-11 | Disposition: A | Discharge status: Home Health | Payer: PPO | Attending: Family Medicine | Admitting: Family Medicine

## 2022-07-10 DIAGNOSIS — E860 Dehydration: Secondary | ICD-10-CM | POA: Diagnosis not present

## 2022-07-10 DIAGNOSIS — I10 Essential (primary) hypertension: Secondary | ICD-10-CM | POA: Diagnosis not present

## 2022-07-10 DIAGNOSIS — F03A0 Unspecified dementia, mild, without behavioral disturbance, psychotic disturbance, mood disturbance, and anxiety: Secondary | ICD-10-CM | POA: Insufficient documentation

## 2022-07-10 DIAGNOSIS — Z7982 Long term (current) use of aspirin: Secondary | ICD-10-CM | POA: Insufficient documentation

## 2022-07-10 DIAGNOSIS — C259 Malignant neoplasm of pancreas, unspecified: Secondary | ICD-10-CM | POA: Diagnosis not present

## 2022-07-10 DIAGNOSIS — R7401 Elevation of levels of liver transaminase levels: Secondary | ICD-10-CM | POA: Diagnosis not present

## 2022-07-10 DIAGNOSIS — Z9689 Presence of other specified functional implants: Secondary | ICD-10-CM | POA: Insufficient documentation

## 2022-07-10 DIAGNOSIS — J45909 Unspecified asthma, uncomplicated: Secondary | ICD-10-CM | POA: Diagnosis not present

## 2022-07-10 DIAGNOSIS — Z85828 Personal history of other malignant neoplasm of skin: Secondary | ICD-10-CM | POA: Insufficient documentation

## 2022-07-10 DIAGNOSIS — E876 Hypokalemia: Secondary | ICD-10-CM | POA: Diagnosis not present

## 2022-07-10 DIAGNOSIS — Z79899 Other long term (current) drug therapy: Secondary | ICD-10-CM | POA: Insufficient documentation

## 2022-07-10 DIAGNOSIS — Z87891 Personal history of nicotine dependence: Secondary | ICD-10-CM | POA: Insufficient documentation

## 2022-07-10 DIAGNOSIS — R55 Syncope and collapse: Secondary | ICD-10-CM | POA: Diagnosis not present

## 2022-07-10 DIAGNOSIS — R7989 Other specified abnormal findings of blood chemistry: Secondary | ICD-10-CM | POA: Diagnosis present

## 2022-07-10 LAB — CBC
HCT: 27.4 % — ABNORMAL LOW (ref 36.0–46.0)
Hemoglobin: 8.7 g/dL — ABNORMAL LOW (ref 12.0–15.0)
MCH: 25.8 pg — ABNORMAL LOW (ref 26.0–34.0)
MCHC: 31.8 g/dL (ref 30.0–36.0)
MCV: 81.3 fL (ref 80.0–100.0)
Platelets: 242 10*3/uL (ref 150–400)
RBC: 3.37 MIL/uL — ABNORMAL LOW (ref 3.87–5.11)
RDW: 15.8 % — ABNORMAL HIGH (ref 11.5–15.5)
WBC: 7.7 10*3/uL (ref 4.0–10.5)
nRBC: 0 % (ref 0.0–0.2)

## 2022-07-10 LAB — COMPREHENSIVE METABOLIC PANEL
ALT: 71 U/L — ABNORMAL HIGH (ref 0–44)
AST: 73 U/L — ABNORMAL HIGH (ref 15–41)
Albumin: 3.2 g/dL — ABNORMAL LOW (ref 3.5–5.0)
Alkaline Phosphatase: 256 U/L — ABNORMAL HIGH (ref 38–126)
Anion gap: 13 (ref 5–15)
BUN: 25 mg/dL — ABNORMAL HIGH (ref 8–23)
CO2: 26 mmol/L (ref 22–32)
Calcium: 8.6 mg/dL — ABNORMAL LOW (ref 8.9–10.3)
Chloride: 98 mmol/L (ref 98–111)
Creatinine, Ser: 0.92 mg/dL (ref 0.44–1.00)
GFR, Estimated: >60 mL/min (ref >60)
Glucose, Bld: 136 mg/dL — ABNORMAL HIGH (ref 70–99)
Potassium: 2.7 mmol/L — CL (ref 3.5–5.1)
Sodium: 137 mmol/L (ref 135–145)
Total Bilirubin: 0.7 mg/dL (ref 0.3–1.2)
Total Protein: 6.2 g/dL — ABNORMAL LOW (ref 6.5–8.1)

## 2022-07-10 LAB — CBC WITH DIFFERENTIAL/PLATELET
Abs Immature Granulocytes: 0.03 10*3/uL (ref 0.00–0.07)
Basophils Absolute: 0 10*3/uL (ref 0.0–0.1)
Basophils Relative: 1 %
Eosinophils Absolute: 0.1 10*3/uL (ref 0.0–0.5)
Eosinophils Relative: 2 %
HCT: 30.2 % — ABNORMAL LOW (ref 36.0–46.0)
Hemoglobin: 9.5 g/dL — ABNORMAL LOW (ref 12.0–15.0)
Immature Granulocytes: 0 %
Lymphocytes Relative: 10 %
Lymphs Abs: 0.7 10*3/uL (ref 0.7–4.0)
MCH: 25.6 pg — ABNORMAL LOW (ref 26.0–34.0)
MCHC: 31.5 g/dL (ref 30.0–36.0)
MCV: 81.4 fL (ref 80.0–100.0)
Monocytes Absolute: 0.4 10*3/uL (ref 0.1–1.0)
Monocytes Relative: 5 %
Neutro Abs: 5.9 10*3/uL (ref 1.7–7.7)
Neutrophils Relative %: 82 %
Platelets: 273 10*3/uL (ref 150–400)
RBC: 3.71 MIL/uL — ABNORMAL LOW (ref 3.87–5.11)
RDW: 15.8 % — ABNORMAL HIGH (ref 11.5–15.5)
WBC: 7.1 10*3/uL (ref 4.0–10.5)
nRBC: 0 % (ref 0.0–0.2)

## 2022-07-10 LAB — CREATININE, SERUM
Creatinine, Ser: 0.85 mg/dL (ref 0.44–1.00)
GFR, Estimated: >60 mL/min (ref >60)

## 2022-07-10 LAB — MAGNESIUM: Magnesium: 1.5 mg/dL — ABNORMAL LOW (ref 1.7–2.4)

## 2022-07-10 LAB — TROPONIN I (HIGH SENSITIVITY)
Troponin I (High Sensitivity): 24 ng/L — ABNORMAL HIGH (ref <18)
Troponin I (High Sensitivity): 25 ng/L — ABNORMAL HIGH (ref <18)

## 2022-07-10 LAB — TSH: TSH: 0.478 u[IU]/mL (ref 0.350–4.500)

## 2022-07-10 LAB — LACTIC ACID, PLASMA: Lactic Acid, Venous: 1.6 mmol/L (ref 0.5–1.9)

## 2022-07-10 MED ORDER — ASPIRIN 81 MG PO TBEC
81.0000 mg | DELAYED_RELEASE_TABLET | Freq: Every day | ORAL | Status: DC
  Administered 2022-07-10 – 2022-07-11 (×2): 81 mg via ORAL
  Filled 2022-07-10 (×2): qty 1

## 2022-07-10 MED ORDER — ACETAMINOPHEN 650 MG RE SUPP: 650.0000 mg | Freq: Four times a day (QID) | RECTAL | Status: DC | PRN

## 2022-07-10 MED ORDER — ENOXAPARIN SODIUM 40 MG/0.4ML IJ SOSY
40.0000 mg | PREFILLED_SYRINGE | INTRAMUSCULAR | Status: DC
  Administered 2022-07-10: 40 mg via SUBCUTANEOUS
  Filled 2022-07-10: qty 0.4

## 2022-07-10 MED ORDER — HYDRALAZINE HCL 20 MG/ML IJ SOLN
10.0000 mg | Freq: Four times a day (QID) | INTRAMUSCULAR | Status: DC | PRN
  Administered 2022-07-10 – 2022-07-11 (×2): 10 mg via INTRAVENOUS
  Filled 2022-07-10 (×2): qty 1

## 2022-07-10 MED ORDER — DONEPEZIL HCL 5 MG PO TABS
5.0000 mg | ORAL_TABLET | Freq: Every morning | ORAL | Status: DC
  Administered 2022-07-10 – 2022-07-11 (×2): 5 mg via ORAL
  Filled 2022-07-10 (×2): qty 1

## 2022-07-10 MED ORDER — POTASSIUM CHLORIDE 10 MEQ/100ML IV SOLN
10.0000 meq | INTRAVENOUS | Status: AC
  Administered 2022-07-10 (×2): 10 meq via INTRAVENOUS
  Filled 2022-07-10: qty 100

## 2022-07-10 MED ORDER — ACETAMINOPHEN 325 MG PO TABS
650.0000 mg | ORAL_TABLET | Freq: Four times a day (QID) | ORAL | Status: DC | PRN
  Administered 2022-07-10: 650 mg via ORAL
  Filled 2022-07-10: qty 2

## 2022-07-10 MED ORDER — MOMETASONE FURO-FORMOTEROL FUM 200-5 MCG/ACT IN AERO
2.0000 | INHALATION_SPRAY | Freq: Two times a day (BID) | RESPIRATORY_TRACT | Status: DC
  Administered 2022-07-10: 2 via RESPIRATORY_TRACT
  Filled 2022-07-10: qty 8.8

## 2022-07-10 MED ORDER — POTASSIUM CHLORIDE IN NACL 40-0.9 MEQ/L-% IV SOLN
INTRAVENOUS | Status: DC
  Filled 2022-07-10 (×7): qty 1000

## 2022-07-10 MED ORDER — SODIUM CHLORIDE 0.9 % IV BOLUS
1000.0000 mL | Freq: Once | INTRAVENOUS | Status: AC
  Administered 2022-07-10: 1000 mL via INTRAVENOUS

## 2022-07-10 MED ORDER — POTASSIUM CHLORIDE CRYS ER 20 MEQ PO TBCR
40.0000 meq | EXTENDED_RELEASE_TABLET | ORAL | Status: AC
  Administered 2022-07-10 (×2): 40 meq via ORAL
  Filled 2022-07-10 (×2): qty 2

## 2022-07-10 MED ORDER — POTASSIUM CHLORIDE 10 MEQ/100ML IV SOLN
INTRAVENOUS | Status: AC
  Administered 2022-07-10: 10 meq via INTRAVENOUS
  Filled 2022-07-10: qty 100

## 2022-07-10 MED ORDER — ACETAMINOPHEN 325 MG PO TABS: 650.0000 mg | ORAL_TABLET | Freq: Four times a day (QID) | ORAL | Status: DC | PRN

## 2022-07-10 MED ORDER — MAGNESIUM OXIDE 400 MG PO TABS
800.0000 mg | ORAL_TABLET | Freq: Two times a day (BID) | ORAL | Status: DC
  Administered 2022-07-10 – 2022-07-11 (×3): 800 mg via ORAL
  Filled 2022-07-10 (×6): qty 2

## 2022-07-10 MED ORDER — ONDANSETRON HCL 4 MG/2ML IJ SOLN
4.0000 mg | Freq: Four times a day (QID) | INTRAMUSCULAR | Status: DC | PRN
  Administered 2022-07-11: 4 mg via INTRAVENOUS
  Filled 2022-07-10: qty 2

## 2022-07-10 MED ORDER — POTASSIUM CHLORIDE CRYS ER 20 MEQ PO TBCR
40.0000 meq | EXTENDED_RELEASE_TABLET | Freq: Once | ORAL | Status: AC
  Administered 2022-07-10: 40 meq via ORAL
  Filled 2022-07-10: qty 2

## 2022-07-10 MED ORDER — MAGNESIUM SULFATE 2 GM/50ML IV SOLN
2.0000 g | Freq: Once | INTRAVENOUS | Status: AC
  Administered 2022-07-10: 2 g via INTRAVENOUS
  Filled 2022-07-10: qty 50

## 2022-07-10 MED ORDER — ONDANSETRON HCL 4 MG PO TABS: 4.0000 mg | ORAL_TABLET | Freq: Four times a day (QID) | ORAL | Status: DC | PRN

## 2022-07-10 NOTE — ED Provider Notes (Signed)
[facility] Provider Note    Event Date/Time   First MD Initiated Contact with Patient 07/10/22 1115     (approximate)   History   Loss of Consciousness   HPI  Shirley Nichols is a 82 y.o. female here with loss of consciousness.  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  She said decreased appetite related to this as well as decreased energy levels.  Over the last several days, she has had acute worsening of her decreased appetite and has been feeling overall generally tired.  She is currently on the tail end of her radiation treatment so she thought it was related to that.  She went to oncology clinic today.  While walking and sitting down, she experienced a syncopal episode and reportedly lost consciousness.  She regained conscious after lying down.  She has since felt generally weak.  There is no seizure-like activity.  There is no postictal state.  She currently feels tired.  No other complaints.  No pain.     Physical Exam   Triage Vital Signs: ED Triage Vitals  Enc Vitals Group     BP 07/10/22 1111 (!) 185/82     Pulse Rate 07/10/22 1111 62     Resp 07/10/22 1111 19     Temp 07/10/22 1113 (!) 97.5 F (36.4 C)     Temp Source 07/10/22 1111 Oral     SpO2 07/10/22 1111 98 %     Weight --      Height --      Head Circumference --      Peak Flow --      Pain Score 07/10/22 1112 0     Pain Loc --      Pain Edu? --      Excl. in [place]? --     Most recent vital signs: Vitals:   07/10/22 1300 07/10/22 1315  BP: (!) 197/95 (!) 176/82  Pulse: 66 80  Resp: 20 (!) 26  Temp:    SpO2:  96%     General: Awake, no distress.  CV:  Good peripheral perfusion.  Regular rate and rhythm.  No murmurs. Resp:  Normal effort.  Abd:  No distention.  No significant tenderness. Other:  No lower extremity edema.  Moderately dry mucous membranes.  No focal neurological deficits.   ED Results / Procedures / Treatments   Labs (all  labs ordered are listed, but only abnormal results are displayed) Labs Reviewed  COMPREHENSIVE METABOLIC PANEL - Abnormal; Notable for the following components:      Result Value   Potassium 2.7 (*)    Glucose, Bld 136 (*)    BUN 25 (*)    Calcium 8.6 (*)    Total Protein 6.2 (*)    Albumin 3.2 (*)    AST 73 (*)    ALT 71 (*)    Alkaline Phosphatase 256 (*)    All other components within normal limits  CBC WITH DIFFERENTIAL/PLATELET - Abnormal; Notable for the following components:   RBC 3.71 (*)    Hemoglobin 9.5 (*)    HCT 30.2 (*)    MCH 25.6 (*)    RDW 15.8 (*)    All other components within normal limits  MAGNESIUM - Abnormal; Notable for the following components:   Magnesium 1.5 (*)    All other components within normal limits  TROPONIN I (HIGH SENSITIVITY) - Abnormal; Notable for the following components:   Troponin  I (High Sensitivity) 24 (*)    All other components within normal limits  TROPONIN I (HIGH SENSITIVITY)     EKG Normal sinus rhythm, ventricular rate 64.  PR 160, QRS 147, QTc 486.  No acute ST elevation or depression.  No acute evidence of acute ischemia infarct.   RADIOLOGY CT head: On my review, no acute abnormality   I also independently reviewed and agree with radiologist interpretations.   PROCEDURES:  Critical Care performed: No  .1-3 Lead EKG Interpretation  Performed by: Duffy Bruce, MD Authorized by: Duffy Bruce, MD     Interpretation: normal     ECG rate:  60-80   ECG rate assessment: normal     Rhythm: sinus rhythm     Ectopy: none     Conduction: normal   Comments:     Indication: Weakness, syncope     MEDICATIONS ORDERED IN ED: Medications  potassium chloride 10 mEq in 100 mL IVPB (0 mEq Intravenous Stopped 07/10/22 1316)  sodium chloride 0.9 % bolus 1,000 mL (has no administration in time range)  sodium chloride 0.9 % bolus 1,000 mL (0 mLs Intravenous Stopped 07/10/22 1342)  potassium chloride SA (KLOR-CON M)  CR tablet 40 mEq (40 mEq Oral Given 07/10/22 1230)  magnesium sulfate IVPB 2 g 50 mL (0 g Intravenous Stopped 07/10/22 1342)     IMPRESSION / MDM / ASSESSMENT AND PLAN / ED COURSE  I reviewed the triage vital signs and the nursing notes.                              Differential diagnosis includes, but is not limited to, orthostatic syncope, cardiac arrhythmia, electrolyte abnormality, anemia, vasovagal episode, seizure  Patient's presentation is most consistent with acute presentation with potential threat to life or bodily function.  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  Suspect orthostatic syncope in the patient was significant orthostatic at time of episode.  Lab work shows profound hypokalemia and hypomagnesemia with slightly elevated QTc.  I suspect this is related to her decreased p.o. intake and her BUN/creatinine ratio does appear to be above her baseline.  She has had significant decreased appetite which I suspect is due to radiation as well as baseline pancreatic cancer.  EKG is nonischemic.  Troponin 24, likely demand related to her transient hypoperfusion and she has no chest pain to suggest ACS.  Given her age, syncopal episode, electrolyte abnormalities and EKG abnormalities, will admit for repletion and monitoring.  Patient and husband in agreement this plan.    FINAL CLINICAL IMPRESSION(S) / ED DIAGNOSES   Final diagnoses:  Dehydration  Hypokalemia  Hypomagnesemia  Syncope, unspecified syncope type     Rx / DC Orders   ED Discharge Orders     None        Note:  This document was prepared using Dragon voice recognition software and may include unintentional dictation errors.   Duffy Bruce, MD 07/10/22 1344

## 2022-07-10 NOTE — Progress Notes (Signed)
   07/10/22 1100  Spiritual Encounters  Type of Visit Initial  Care provided to: Pt and family  Conversation partners present during encounter Nurse  Referral source Chaplain team  Reason for visit Code  OnCall Visit No  Interventions  Spiritual Care Interventions Made Established relationship of care and support;Compassionate presence  Intervention Outcomes  Outcomes Awareness around self/spiritual resourses;Connection to spiritual care  Spiritual Care Plan  Spiritual Care Issues Still Outstanding No further spiritual care needs at this time (see row info)   This [place] visited with the pt and family at the recommendation of Baltazar Najjar. Melven Sartorius offered an active listening and made the family know of spiritual and emotional support. Melven Sartorius will follow up as /when needed.

## 2022-07-10 NOTE — ED Triage Notes (Signed)
Pt to ED from cancer center with syncopal episode while changing to start radiation for pancreatic cancer. Pt was found on bench by EMS. CBG 155. Pt has a history of electrolyte imbalance and denies recent illness/dizziness. Pt husband reports reduced intake over the last week.

## 2022-07-10 NOTE — H&P (Signed)
History and Physical    Shirley Nichols [id number] DOB: 01-Apr-1941 DOA: 07/10/2022  PCP: Baxter Hire, MD  Patient coming from: Oncologist office  I have personally briefly reviewed patient's old medical records in [place]  Chief Complaint: Passing out  HPI: Shirley Nichols is a 82 y.o. 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  History was provided to me by ED physician.  Per history, patient has been feeling very weak and fatigued with poor p.o. intake since about 1 and half week.  She went to get her radiation at oncologist office today and while there, she had an episode of witnessed passing out.  She did not have any postictal phase or any seizure-like activity or any tongue biting, or any bowel or bladder incontinence.  She was brought into the emergency department for further management.  When asking from the patient, she does not know if she had any episode of passing out.  She does not have abdomen but if she did receive her radiation today or not.  She clearly seems to have memory deficit.  Reportedly she was recently started on Aricept.  ED Course: Upon arrival to the ED, patient was hypertensive.  She has chronic anemia and her hemoglobin was at baseline.  She was found to have severe hypokalemia and hypomagnesemia.  Mild hyperglycemia.  Elevated alkaline phosphatase, aminotransferases.  Review of Systems: As per HPI otherwise negative.    Past Medical History:  Diagnosis Date   Cancer ([place])    skin cancer   Hyperlipidemia    Hypertension     Past Surgical History:  Procedure Laterality Date   ABDOMINAL HYSTERECTOMY     BILIARY BRUSHING  03/05/2022   Procedure: BILIARY BRUSHING;  Surgeon: Carol Ada, MD;  Location: [facility] ENDOSCOPY;  Service: Gastroenterology;;   BILIARY STENT PLACEMENT  03/05/2022   Procedure:  BILIARY STENT PLACEMENT;  Surgeon: Carol Ada, MD;  Location: [facility] ENDOSCOPY;  Service: Gastroenterology;;   COLONOSCOPY     02/21/2002 adenomatous polyp, 03/23/2011   COLONOSCOPY W/ POLYPECTOMY  02/2000   TA polyp removed from ascending colon.  performed through [facility] (? affiliate in [place])   COLONOSCOPY WITH PROPOFOL N/A 11/03/2016   Procedure: COLONOSCOPY WITH PROPOFOL;  Surgeon: Manya Silvas, MD;  Location: [facility] ENDOSCOPY;  Service: Endoscopy;  Laterality: N/A;   ERCP N/A 03/05/2022   Procedure: ENDOSCOPIC RETROGRADE CHOLANGIOPANCREATOGRAPHY (ERCP);  Surgeon: Carol Ada, MD;  Location: [place];  Service: Gastroenterology;  Laterality: N/A;   HEMORROIDECTOMY     TONSILLECTOMY       reports that she has quit smoking. She has never used smokeless tobacco. She reports that she does not drink alcohol and does not use drugs.  No Known Allergies  Family History  Problem Relation Age of Onset   Seizures Sister     Prior to Admission medications   Medication Sig Start Date End Date Taking? Authorizing Provider  acetaminophen (TYLENOL) 325 MG tablet Take 2 tablets (650 mg total) by mouth every 6 (six) hours as needed for mild pain, fever or headache. 03/30/22   Angiulli, Lavon Paganini, PA-C  aspirin EC 81 MG tablet Take 81 mg by mouth daily.    [provider]  donepezil (ARICEPT) 5 MG tablet Take 1 tablet (5 mg total) by mouth every morning. 06/29/22     magnesium oxide (MAG-OX) 400 MG tablet  Take 2 tablets (800 mg total) by mouth 2 (two) times daily. 03/30/22   Angiulli, Lavon Paganini, PA-C  megestrol (MEGACE) 40 MG/ML suspension Take 400 mg by mouth daily. Patient not taking: Reported on 05/25/2022 05/08/22   [provider]  SYMBICORT 160-4.5 MCG/ACT inhaler Inhale 2 puffs into the lungs as needed (for wheezing or shortness of breath). 03/30/22   Angiulli, Lavon Paganini, PA-C  fluvastatin XL (LESCOL XL) 80 MG 24 hr tablet Take 1 tablet (80 mg total) by mouth daily. 04/06/22  04/25/22      Physical Exam: Vitals:   07/10/22 1230 07/10/22 1300 07/10/22 1315 07/10/22 1400  BP: (!) 178/65 (!) 197/95 (!) 176/82   Pulse: 65 66 80   Resp: 15 20 (!) 26   Temp:      TempSrc:      SpO2:   96%   Weight:    53 kg    Constitutional: NAD, calm, comfortable Vitals:   07/10/22 1230 07/10/22 1300 07/10/22 1315 07/10/22 1400  BP: (!) 178/65 (!) 197/95 (!) 176/82   Pulse: 65 66 80   Resp: 15 20 (!) 26   Temp:      TempSrc:      SpO2:   96%   Weight:    53 kg   Eyes: PERRL, lids and conjunctivae normal ENMT: Mucous membranes are moist. Posterior pharynx clear of any exudate or lesions.Normal dentition.  Neck: normal, supple, no masses, no thyromegaly Respiratory: clear to auscultation bilaterally, no wheezing, no crackles. Normal respiratory effort. No accessory muscle use.  Cardiovascular: Regular rate and rhythm, no murmurs / rubs / gallops. No extremity edema. 2+ pedal pulses. No carotid bruits.  Abdomen: no tenderness, no masses palpated. No hepatosplenomegaly. Bowel sounds positive.  Musculoskeletal: no clubbing / cyanosis. No joint deformity upper and lower extremities. Good ROM, no contractures. Normal muscle tone.  Skin: no rashes, lesions, ulcers. No induration Neurologic: CN 2-12 grossly intact. Sensation intact, DTR normal. Strength 5/5 in all 4.  Psychiatric: Normal judgment and insight. Alert and oriented x 3. Normal mood.    Labs on Admission: I have personally reviewed following labs and imaging studies  CBC: Recent Labs  Lab 07/10/22 1116  WBC 7.1  NEUTROABS 5.9  HGB 9.5*  HCT 30.2*  MCV 81.4  PLT 123456   Basic Metabolic Panel: Recent Labs  Lab 07/10/22 1115 07/10/22 1116  NA  --  137  K  --  2.7*  CL  --  98  CO2  --  26  GLUCOSE  --  136*  BUN  --  25*  CREATININE  --  0.92  CALCIUM  --  8.6*  MG 1.5*  --    GFR: Estimated Creatinine Clearance: 39.4 mL/min (by C-G formula based on SCr of 0.92 mg/dL). Liver Function  Tests: Recent Labs  Lab 07/10/22 1116  AST 73*  ALT 71*  ALKPHOS 256*  BILITOT 0.7  PROT 6.2*  ALBUMIN 3.2*   No results for input(s): "LIPASE", "AMYLASE" in the last 168 hours. No results for input(s): "AMMONIA" in the last 168 hours. Coagulation Profile: No results for input(s): "INR", "PROTIME" in the last 168 hours. Cardiac Enzymes: No results for input(s): "CKTOTAL", "CKMB", "CKMBINDEX", "TROPONINI" in the last 168 hours. BNP (last 3 results) No results for input(s): "PROBNP" in the last 8760 hours. HbA1C: No results for input(s): "HGBA1C" in the last 72 hours. CBG: No results for input(s): "GLUCAP" in the last 168 hours. Lipid Profile: No results for  input(s): "CHOL", "HDL", "LDLCALC", "TRIG", "CHOLHDL", "LDLDIRECT" in the last 72 hours. Thyroid Function Tests: No results for input(s): "TSH", "T4TOTAL", "FREET4", "T3FREE", "THYROIDAB" in the last 72 hours. Anemia Panel: No results for input(s): "VITAMINB12", "FOLATE", "FERRITIN", "TIBC", "IRON", "RETICCTPCT" in the last 72 hours. Urine analysis:    Component Value Date/Time   COLORURINE YELLOW (A) 06/14/2022 0839   APPEARANCEUR HAZY (A) 06/14/2022 0839   LABSPEC 1.017 06/14/2022 0839   PHURINE 5.0 06/14/2022 0839   GLUCOSEU NEGATIVE 06/14/2022 0839   HGBUR NEGATIVE 06/14/2022 0839   BILIRUBINUR NEGATIVE 06/14/2022 0839   KETONESUR NEGATIVE 06/14/2022 0839   PROTEINUR 30 (A) 06/14/2022 0839   NITRITE NEGATIVE 06/14/2022 0839   LEUKOCYTESUR TRACE (A) 06/14/2022 0839    Radiological Exams on Admission: CT HEAD WO CONTRAST (5MM)  Result Date: 07/10/2022 CLINICAL DATA:  Syncope/presyncope. Cerebrovascular cause suspected history of pancreatic cancer. EXAM: CT HEAD WITHOUT CONTRAST TECHNIQUE: Contiguous axial images were obtained from the base of the skull through the vertex without intravenous contrast. RADIATION DOSE REDUCTION: This exam was performed according to the departmental dose-optimization program which  includes automated exposure control, adjustment of the mA and/or kV according to patient size and/or use of iterative reconstruction technique. COMPARISON:  03/10/2022 CT and MRI. FINDINGS: Brain: Age related atrophy. Chronic small-vessel ischemic changes of the white matter of a mild degree. No evidence of acute infarction, mass lesion, hemorrhage, hydrocephalus or extra-axial collection. Vascular: There is atherosclerotic calcification of the major vessels at the base of the brain. Skull: Negative Sinuses/Orbits: Clear/normal Other: None IMPRESSION: No acute CT finding. Age related atrophy. 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 Electronically Signed   By: Nelson Chimes M.D.   On: 07/10/2022 13:41    EKG: Independently reviewed.  Sinus rhythm with right bundle branch block.  No acute ST-T wave changes.  Assessment/Plan Principal Problem:   Syncope Active Problems:   Hypokalemia   Hypomagnesemia   Elevated LFTs   Dehydration   Syncope/dehydration: Likely in the setting of poor p.o. intake.  Patient does endorse eating good amount of food but also endorses not drinking enough liquids.  She clinically appears to be extremely dehydrated and I am surprised that her creatinine is normal.  I will check her lactic acid.  She has received 2 L of IV fluids, I will start her on normal saline at 100 cc/h and monitor on telemetry for now.  Patient had transthoracic echo done 3 months ago which did not show any valvular heart defect so I doubt that she will have any aortic stenosis or any other valvular heart defect causing syncope but we will be monitoring her on telemetry.  Hypomagnesemia/hypokalemia: Some has been replaced, I will replace tomorrow.  Recheck in the morning.  Monitor on telemetry.  Elevated blood pressure: Unsure if patient carries any history of hypertension, she does not appear to be on any antihypertensives.  I will place her as needed hydralazine for now.  Elevated  LFTs: Unsure whether this is secondary to radiation itself.  Checking ultrasound abdomen and repeating LFTs in the morning.  History of pancreatic cancer on radiation: Follow-up as outpatient.  DVT prophylaxis: enoxaparin (LOVENOX) injection 40 mg Start: 07/10/22 1415 Code Status: Full code.  Patient could not make a decision. Family Communication: None present at bedside.  Disposition Plan: Likely discharge tomorrow. Consults called: None  Darliss Cheney MD [facility]  *Please note that this is a verbal dictation therefore any spelling or grammatical errors are due to the "  [employer]" system interpretation.  Please page via [place] and do not message via secure chat for urgent patient care matters. Secure chat can be used for non urgent patient care matters. 07/10/2022, 2:20 PM  To contact the attending provider between 7A-7P or the covering provider during after hours 7P-7A, please log into the web site [email or web address]

## 2022-07-10 NOTE — Plan of Care (Signed)
  Problem: Clinical Measurements: Goal: Ability to maintain clinical measurements within normal limits will improve Outcome: Progressing   

## 2022-07-10 NOTE — ED Notes (Signed)
EKG to EDP S. Jacelyn Grip in person.

## 2022-07-11 ENCOUNTER — Ambulatory Visit: Payer: PPO

## 2022-07-11 ENCOUNTER — Other Ambulatory Visit: Payer: Self-pay | Admitting: Internal Medicine

## 2022-07-11 DIAGNOSIS — R55 Syncope and collapse: Secondary | ICD-10-CM | POA: Diagnosis not present

## 2022-07-11 LAB — COMPREHENSIVE METABOLIC PANEL
ALT: 52 U/L — ABNORMAL HIGH (ref 0–44)
AST: 31 U/L (ref 15–41)
Albumin: 3.2 g/dL — ABNORMAL LOW (ref 3.5–5.0)
Alkaline Phosphatase: 235 U/L — ABNORMAL HIGH (ref 38–126)
Anion gap: 6 (ref 5–15)
BUN: 20 mg/dL (ref 8–23)
CO2: 27 mmol/L (ref 22–32)
Calcium: 8.6 mg/dL — ABNORMAL LOW (ref 8.9–10.3)
Chloride: 106 mmol/L (ref 98–111)
Creatinine, Ser: 0.88 mg/dL (ref 0.44–1.00)
GFR, Estimated: >60 mL/min (ref >60)
Glucose, Bld: 124 mg/dL — ABNORMAL HIGH (ref 70–99)
Potassium: 4 mmol/L (ref 3.5–5.1)
Sodium: 139 mmol/L (ref 135–145)
Total Bilirubin: 0.7 mg/dL (ref 0.3–1.2)
Total Protein: 6.2 g/dL — ABNORMAL LOW (ref 6.5–8.1)

## 2022-07-11 LAB — CBC
HCT: 30.9 % — ABNORMAL LOW (ref 36.0–46.0)
Hemoglobin: 10 g/dL — ABNORMAL LOW (ref 12.0–15.0)
MCH: 26.2 pg (ref 26.0–34.0)
MCHC: 32.4 g/dL (ref 30.0–36.0)
MCV: 80.9 fL (ref 80.0–100.0)
Platelets: 284 10*3/uL (ref 150–400)
RBC: 3.82 MIL/uL — ABNORMAL LOW (ref 3.87–5.11)
RDW: 15.9 % — ABNORMAL HIGH (ref 11.5–15.5)
WBC: 7.5 10*3/uL (ref 4.0–10.5)
nRBC: 0 % (ref 0.0–0.2)

## 2022-07-11 LAB — MAGNESIUM: Magnesium: 1.7 mg/dL (ref 1.7–2.4)

## 2022-07-11 MED ORDER — MAGNESIUM SULFATE 2 GM/50ML IV SOLN
2.0000 g | Freq: Once | INTRAVENOUS | Status: AC
  Administered 2022-07-11: 2 g via INTRAVENOUS
  Filled 2022-07-11: qty 50

## 2022-07-11 MED ORDER — ENSURE ENLIVE PO LIQD
237.0000 mL | Freq: Two times a day (BID) | ORAL | Status: DC
  Administered 2022-07-11 (×2): 237 mL via ORAL

## 2022-07-11 MED ORDER — ADULT MULTIVITAMIN W/MINERALS CH
1.0000 | ORAL_TABLET | Freq: Every day | ORAL | Status: DC
  Administered 2022-07-11: 1 via ORAL
  Filled 2022-07-11: qty 1

## 2022-07-11 MED ORDER — MEGESTROL ACETATE 400 MG/10ML PO SUSP
400.0000 mg | Freq: Every day | ORAL | Status: DC
  Administered 2022-07-11: 400 mg via ORAL
  Filled 2022-07-11: qty 10

## 2022-07-11 NOTE — Discharge Summary (Signed)
Physician Discharge Summary  Shirley Nichols [id number] DOB: 24-Feb-1941 DOA: 07/10/2022  PCP: Baxter Hire, MD  Admit date: 07/10/2022 Discharge date: 07/11/2022    Admitted From: Home Disposition: Home  Recommendations for Outpatient Follow-up:  Follow up with PCP in 1-2 weeks Please obtain BMP/CBC in one week Please follow up with your PCP on the following pending results: Unresulted Labs (From admission, onward)     Start     Ordered   07/17/22 0500  Creatinine, serum  (enoxaparin (LOVENOX)    CrCl >/= 30 ml/min)  Weekly,   R     Comments: while on enoxaparin therapy    07/10/22 [place]: Yes Equipment/Devices: Rolling walker  Discharge Condition: Stable CODE STATUS: Full code Diet recommendation: Cardiac  HPI: Shirley Nichols is a 82 y.o. 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  History was provided to me by ED physician.  Per history, patient has been feeling very weak and fatigued with poor p.o. intake since about 1 and half week.  She went to get her radiation at oncologist office today and while there, she had an episode of witnessed passing out.  She did not have any postictal phase or any seizure-like activity or any tongue biting, or any bowel or bladder incontinence.  She was brought into the emergency department for further management.  When asking from the patient, she does not know if she had any episode of passing out.  She does not have abdomen but if she did receive her radiation today or not.  She clearly seems to have memory deficit.  Reportedly she was recently started on Aricept.   ED Course: Upon arrival to the ED, patient was hypertensive.  She has chronic anemia and her hemoglobin was at baseline.  She was found to have severe hypokalemia and hypomagnesemia.  Mild hyperglycemia.   Elevated alkaline phosphatase, aminotransferases.    Subjective: Seen and examined this morning, daughter at the bedside.  Patient has no complaints.  Brief/Interim Summary: Patient was admitted due to syncope/dehydration/hypomagnesemia and hypokalemia.  Details below.  Syncope/dehydration: Likely in the setting of poor p.o. intake.  Patient does endorse eating good amount of food but also endorses not drinking enough liquids.  She clinically appeared to be extremely dehydrated, she was given significant IV fluids.  She now looks better but is still dehydrated.  However she wants to go home and family supports that as well.  I have advised her to drink plenty of fluids at home.  She verbalized understanding.   Patient had transthoracic echo done 3 months ago which did not show any valvular heart defect so I doubt that she will have any aortic stenosis or any other valvular heart defect causing syncope.   Hypomagnesemia/hypokalemia: Replaced and normal today.   Elevated blood pressure: Unsure if patient carries any history of hypertension, she does not appear to be on any antihypertensives.  She was placed on as needed hydralazine.  Blood pressure appears to be slightly elevated but appropriate for her age.  Follow-up with PCP for further management if blood pressure remains elevated.   Elevated LFTs: Unsure whether this is secondary to radiation itself.  Ultrasound unremarkable.  LFTs improving.  Repeat LFTs at his next PCPs visit in 1 week.   History of pancreatic  cancer on radiation: Follow-up as outpatient.  Discharge plan was discussed with patient and/or family member and they verbalized understanding and agreed with it.  Discharge Diagnoses:  Principal Problem:   Syncope Active Problems:   Hypokalemia   Hypomagnesemia   Elevated LFTs   Dehydration    Discharge Instructions   Allergies as of 07/11/2022   No Known Allergies      Medication List     TAKE these medications     acetaminophen 325 MG tablet Commonly known as: TYLENOL Take 2 tablets (650 mg total) by mouth every 6 (six) hours as needed for mild pain, fever or headache.   aspirin EC 81 MG tablet Take 81 mg by mouth daily.   donepezil 5 MG tablet Commonly known as: ARICEPT Take 1 tablet (5 mg total) by mouth every morning.   ezetimibe 10 MG tablet Commonly known as: ZETIA Take 10 mg by mouth daily.   magnesium oxide 400 MG tablet Commonly known as: MAG-OX Take 2 tablets (800 mg total) by mouth 2 (two) times daily.   megestrol 40 MG/ML suspension Commonly known as: MEGACE Take 400 mg by mouth daily.   Symbicort 160-4.5 MCG/ACT inhaler Generic drug: budesonide-formoterol Inhale 2 puffs into the lungs as needed (for wheezing or shortness of breath).               Durable Medical Equipment  (From admission, onward)           Start     Ordered   07/11/22 1414  For home use only DME Walker rolling  Once       Question Answer Comment  Walker: With [place] Wheels   Patient needs a walker to treat with the following condition Generalized weakness      07/11/22 1414            Follow-up Information     Baxter Hire, MD Follow up in 1 week(s).   Specialty: Internal Medicine Contact information: [place] [address] [phone]                No Known Allergies  Consultations: None   Procedures/Studies: US Abdomen Limited RUQ (LIVER/GB)  Result Date: 07/10/2022 CLINICAL DATA:  Elevated LFTs. EXAM: ULTRASOUND ABDOMEN LIMITED RIGHT UPPER QUADRANT COMPARISON:  CT of the abdomen and pelvis 04/17/2022 FINDINGS: Gallbladder: Gallbladder wall is thickened, 3.6 millimeters. Layering in the dependent aspect of the gallbladder, masslike sludge measures 2.6 centimeters. No radiopaque calculi identified. Common bile duct: Diameter: 3.7 millimeters. Stent is identified within the bile duct. Liver: The liver is normal in echotexture. Gas is  identified within the intrahepatic biliary tree. No suspicious mass. Portal vein is patent on color Doppler imaging with normal direction of blood flow towards the liver. Other: None. IMPRESSION: 1. Layering tumefactive sludge in the gallbladder. 2. Gallbladder wall thickening. 3. Biliary stent within the common bile duct. 4. Pneumobilia. Electronically Signed   By: Nolon Nations M.D.   On: 07/10/2022 15:46   CT HEAD WO CONTRAST (5MM)  Result Date: 07/10/2022 CLINICAL DATA:  Syncope/presyncope. Cerebrovascular cause suspected history of pancreatic cancer. EXAM: CT HEAD WITHOUT CONTRAST TECHNIQUE: Contiguous axial images were obtained from the base of the skull through the vertex without intravenous contrast. RADIATION DOSE REDUCTION: This exam was performed according to the departmental dose-optimization program which includes automated exposure control, adjustment of the mA and/or kV according to patient size and/or use of iterative reconstruction technique. COMPARISON:  03/10/2022 CT and MRI.  FINDINGS: Brain: Age related atrophy. Chronic small-vessel ischemic changes of the white matter of a mild degree. No evidence of acute infarction, mass lesion, hemorrhage, hydrocephalus or extra-axial collection. Vascular: There is atherosclerotic calcification of the major vessels at the base of the brain. Skull: Negative Sinuses/Orbits: Clear/normal Other: None IMPRESSION: No acute CT finding. Age related atrophy. 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 Electronically Signed   By: Nelson Chimes M.D.   On: 07/10/2022 13:41     Discharge Exam: Vitals:   07/11/22 0807 07/11/22 1208  BP: (!) 152/68 (!) 164/72  Pulse: 82 95  Resp: 18 18  Temp: 98 F (36.7 C) 98 F (36.7 C)  SpO2: 96% 98%   Vitals:   07/11/22 0500 07/11/22 0603 07/11/22 0807 07/11/22 1208  BP:  (!) 166/79 (!) 152/68 (!) 164/72  Pulse:  72 82 95  Resp:  18 18 18  $ Temp:  98 F (36.7 C) 98 F (36.7 C) 98 F (36.7 C)   TempSrc:   Oral Oral  SpO2:  99% 96% 98%  Weight: 52 kg     Height:        General: Pt is alert, awake, not in acute distress Cardiovascular: RRR, S1/S2 +, no rubs, no gallops Respiratory: CTA bilaterally, no wheezing, no rhonchi Abdominal: Soft, NT, ND, bowel sounds + Extremities: no edema, no cyanosis    The results of significant diagnostics from this hospitalization (including imaging, microbiology, ancillary and laboratory) are listed below for reference.     Microbiology: No results found for this or any previous visit (from the past 240 hour(s)).   Labs: BNP (last 3 results) Recent Labs    04/17/22 0913  BNP [id number]*   Basic Metabolic Panel: Recent Labs  Lab 07/10/22 1115 07/10/22 1116 07/10/22 1400 07/11/22 0444  NA  --  137  --  139  K  --  2.7*  --  4.0  CL  --  98  --  106  CO2  --  26  --  27  GLUCOSE  --  136*  --  124*  BUN  --  25*  --  20  CREATININE  --  0.92 0.85 0.88  CALCIUM  --  8.6*  --  8.6*  MG 1.5*  --   --  1.7   Liver Function Tests: Recent Labs  Lab 07/10/22 1116 07/11/22 0444  AST 73* 31  ALT 71* 52*  ALKPHOS 256* 235*  BILITOT 0.7 0.7  PROT 6.2* 6.2*  ALBUMIN 3.2* 3.2*   No results for input(s): "LIPASE", "AMYLASE" in the last 168 hours. No results for input(s): "AMMONIA" in the last 168 hours. CBC: Recent Labs  Lab 07/10/22 1116 07/10/22 2208 07/11/22 0444  WBC 7.1 7.7 7.5  NEUTROABS 5.9  --   --   HGB 9.5* 8.7* 10.0*  HCT 30.2* 27.4* 30.9*  MCV 81.4 81.3 80.9  PLT 273 242 284   Cardiac Enzymes: No results for input(s): "CKTOTAL", "CKMB", "CKMBINDEX", "TROPONINI" in the last 168 hours. BNP: Invalid input(s): "POCBNP" CBG: No results for input(s): "GLUCAP" in the last 168 hours. D-Dimer No results for input(s): "DDIMER" in the last 72 hours. Hgb A1c No results for input(s): "HGBA1C" in the last 72 hours. Lipid Profile No results for input(s): "CHOL", "HDL", "LDLCALC", "TRIG", "CHOLHDL", "LDLDIRECT" in the  last 72 hours. Thyroid function studies Recent Labs    07/10/22 1400  TSH 0.478   Anemia work up No results for input(s): "VITAMINB12", "FOLATE", "  FERRITIN", "TIBC", "IRON", "RETICCTPCT" in the last 72 hours. Urinalysis    Component Value Date/Time   COLORURINE YELLOW (A) 06/14/2022 0839   APPEARANCEUR HAZY (A) 06/14/2022 0839   LABSPEC 1.017 06/14/2022 0839   PHURINE 5.0 06/14/2022 0839   GLUCOSEU NEGATIVE 06/14/2022 0839   HGBUR NEGATIVE 06/14/2022 0839   BILIRUBINUR NEGATIVE 06/14/2022 0839   KETONESUR NEGATIVE 06/14/2022 0839   PROTEINUR 30 (A) 06/14/2022 0839   NITRITE NEGATIVE 06/14/2022 0839   LEUKOCYTESUR TRACE (A) 06/14/2022 0839   Sepsis Labs Recent Labs  Lab 07/10/22 1116 07/10/22 2208 07/11/22 0444  WBC 7.1 7.7 7.5   Microbiology No results found for this or any previous visit (from the past 240 hour(s)).   Time coordinating discharge: Over 30 minutes  SIGNED:   Darliss Cheney, MD  [facility] 07/11/2022, 2:22 PM *Please note that this is a verbal dictation therefore any spelling or grammatical errors are due to the "[place] One" system interpretation. If 7PM-7AM, please contact night-coverage [email or web address]

## 2022-07-11 NOTE — TOC Transition Note (Signed)
Transition of Care [facility]) - CM/SW Discharge Note   Patient Details  Name: Shirley Nichols MRN: [id number] Date of Birth: 1941/01/20  [facility] CM/SW Contact:  Tiburcio Bash, LCSW Phone Number: 07/11/2022, 3:59 PM   Clinical Narrative:     Patient active with [facility] services, orders in to resume services. Patient's spouse aware.   Spouse reports patient already has a walker at home,no dme needs. Meg with [facility] informed of dc today and to reach out to spouse to arrange visits.   Final next level of care: [place] Barriers to Discharge: No Barriers Identified   Patient Goals and CMS Choice      Discharge Placement                         Discharge Plan and Services Additional resources added to the After Visit Summary for                              [place]: [facility]        Social Determinants of Health (SDOH) Interventions SDOH Screenings   Food Insecurity: No Food Insecurity (07/10/2022)  Housing: Low Risk  (07/10/2022)  Transportation Needs: No Transportation Needs (07/10/2022)  Utilities: Not At Risk (07/10/2022)  Tobacco Use: Medium Risk (06/09/2022)     Readmission Risk Interventions    03/23/2022    2:34 PM 03/10/2022    1:01 PM  Readmission Risk Prevention Plan  Transportation Screening  Complete  PCP or Specialist Appt within 5-7 Days  Complete  Home Care Screening  Complete  Medication Review (RN CM)  Complete  HRI or Home Care Consult Complete   Social Work Consult for [place] Planning/Counseling Complete   Palliative Care Screening Complete   Medication Review Press photographer) Complete

## 2022-07-11 NOTE — Progress Notes (Signed)
Shirley Nichols to be D/C'd Home per MD order.  Discussed prescriptions and follow up appointments with the patient. Prescriptions given to patient, medication list explained in detail. Pt verbalized understanding.  Allergies as of 07/11/2022   No Known Allergies      Medication List     TAKE these medications    acetaminophen 325 MG tablet Commonly known as: TYLENOL Take 2 tablets (650 mg total) by mouth every 6 (six) hours as needed for mild pain, fever or headache.   aspirin EC 81 MG tablet Take 81 mg by mouth daily.   donepezil 5 MG tablet Commonly known as: ARICEPT Take 1 tablet (5 mg total) by mouth every morning.   ezetimibe 10 MG tablet Commonly known as: ZETIA Take 10 mg by mouth daily.   magnesium oxide 400 MG tablet Commonly known as: MAG-OX Take 2 tablets (800 mg total) by mouth 2 (two) times daily.   megestrol 40 MG/ML suspension Commonly known as: MEGACE Take 400 mg by mouth daily.   Symbicort 160-4.5 MCG/ACT inhaler Generic drug: budesonide-formoterol Inhale 2 puffs into the lungs as needed (for wheezing or shortness of breath).               Durable Medical Equipment  (From admission, onward)           Start     Ordered   07/11/22 1414  For home use only DME Walker rolling  Once       Question Answer Comment  Walker: With 5 [place] Wheels   Patient needs a walker to treat with the following condition Generalized weakness      07/11/22 1414            Vitals:   07/11/22 1208 07/11/22 1539  BP: (!) 164/72 (!) 161/67  Pulse: 95 73  Resp: 18 18  Temp: 98 F (36.7 C) 98.5 F (36.9 C)  SpO2: 98% 100%    Tele box removed and returned. IV catheter discontinued intact. Site without signs and symptoms of complications. Dressing and pressure applied. Pt denies pain at this time. No complaints noted.  An After Visit Summary was printed and given to the patient. Patient escorted via [place], and D/C home via private auto.  Rolley Sims

## 2022-07-11 NOTE — Evaluation (Signed)
Physical Therapy Evaluation Patient Details Name: Shirley Nichols MRN: [id number] DOB: 06/02/1940 Today's Date: 07/11/2022  History of Present Illness  presented to ER after passing out at medical appt; admitted for management of syncope/dehydration  Clinical Impression  Patient up in recliner upon arrival to room; alert and oriented (to person, location, month, year), follows commands and agreeable to participation with session (as a means of facilitating return to bed at end of session).  Denies pain; does endorse generalized fatigue ("didn't sleep well last night" and "up all day").  Bilat UE/LE strength and ROM grossly symmetrical and WFL; no focal weakness appreciated.  Able to complete sit/stand, basic transfers and gait (100') without assist device, cga/min assist.  Demonstrates reciprocal stepping pattern with fair step height/length, guarded trunk posturing with limited/no arm swing; decreased cadence/gait speed with limited balance reactions evident (mild/mod sway with head turns). Vitals stable and WFL with gait efforts; no subjective reports of dizziness/lightheadedness with mobility  Additional gait trial (50') with RW, close sup; marked improvement in gait speed and overall safety with gait; do recommend continued use of RW.  Patient voiced understanding Would benefit from skilled PT to address above deficits and promote optimal return to PLOF.; Recommend transition to HHPT upon discharge from acute hospitalization.      Recommendations for follow up therapy are one component of a multi-disciplinary discharge planning process, led by the attending physician.  Recommendations may be updated based on patient status, additional functional criteria and insurance authorization.  Follow Up Recommendations Home health PT      Assistance Recommended at Discharge PRN  Patient can return home with the following  A little help with walking and/or transfers;A little help with  bathing/dressing/bathroom    Equipment Recommendations Rolling walker (2 wheels)  Recommendations for Other Services       Functional Status Assessment Patient has had a recent decline in their functional status and demonstrates the ability to make significant improvements in function in a reasonable and predictable amount of time.     Precautions / Restrictions Precautions Precautions: Fall Restrictions Weight Bearing Restrictions: No      Mobility  Bed Mobility               General bed mobility comments: seated in recliner beginning/end of treatment session    Transfers Overall transfer level: Needs assistance Equipment used: None Transfers: Sit to/from Stand Sit to Stand: Supervision                Ambulation/Gait Ambulation/Gait assistance: Supervision, Min guard Gait Distance (Feet): 100 Feet Assistive device: None         General Gait Details: reciprocal stepping pattern with fair step height/length, guarded trunk posturing with limited/no arm swing; decreased cadence/gait speed with limited balance reactions evident (mild/mod sway with head turns).  Vitals stable and WFL with gait efforts; no subjective reports of dizziness/lightheadedness with mobility  Stairs            Wheelchair Mobility    Modified Rankin (Stroke Patients Only)       Balance Overall balance assessment: Needs assistance Sitting-balance support: No upper extremity supported, Feet supported Sitting balance-Leahy Scale: Good     Standing balance support: No upper extremity supported Standing balance-Leahy Scale: Fair                               Pertinent Vitals/Pain Pain Assessment Pain Assessment: No/denies pain    Home  Living Family/patient expects to be discharged to:: Private residence Living Arrangements: Spouse/significant other Available Help at Discharge: Family;Available 24 hours/day Type of Home: House Home Access: Stairs to  enter Entrance Stairs-Rails: Left Entrance Stairs-Number of Steps: 3   Home Layout: One level Home Equipment: Rollator (4 wheels);Shower seat;Grab bars - tub/shower;Toilet riser      Prior Function Prior Level of Function : Independent/Modified Independent;Driving             Mobility Comments: Indep with ADLs, household and limited community mobilization without assist device; denies fall history.       Hand Dominance   Dominant Hand: Right    Extremity/Trunk Assessment   Upper Extremity Assessment Upper Extremity Assessment: Overall WFL for tasks assessed    Lower Extremity Assessment Lower Extremity Assessment: Overall WFL for tasks assessed       Communication   Communication: No difficulties  Cognition Arousal/Alertness: Awake/alert Behavior During Therapy: WFL for tasks assessed/performed Overall Cognitive Status: Within Functional Limits for tasks assessed                                 General Comments: Slightly agitated with therapy session, focused solely on returning to bed for rest ("I didn't sleep well last night and I've been up all day")        General Comments      Exercises Other Exercises Other Exercises: 55' with RW, close sup-marked improvement in gait speed and overall safety with gait; do recommend continued use of RW.  Patient voiced understanding   Assessment/Plan    PT Assessment Patient needs continued PT services  PT Problem List Decreased mobility;Decreased activity tolerance;Decreased balance;Decreased cognition;Decreased knowledge of use of DME;Decreased safety awareness;Decreased knowledge of precautions       PT Treatment Interventions DME instruction;Gait training;Stair training;Functional mobility training;Therapeutic activities;Therapeutic exercise;Balance training;Patient/family education    PT Goals (Current goals can be found in the Care Plan section)  Acute Rehab PT Goals Patient Stated Goal: to  return home PT Goal Formulation: With patient Time For Goal Achievement: 07/25/22 Potential to Achieve Goals: Good    Frequency Min 2X/week     Co-evaluation               AM-PAC PT "6 Clicks" Mobility  Outcome Measure Help needed turning from your back to your side while in a flat bed without using bedrails?: None Help needed moving from lying on your back to sitting on the side of a flat bed without using bedrails?: None Help needed moving to and from a bed to a chair (including a wheelchair)?: None Help needed standing up from a chair using your arms (e.g., wheelchair or bedside chair)?: None Help needed to walk in hospital room?: A Little Help needed climbing 3-5 steps with a railing? : A Little 6 Click Score: 22    End of Session Equipment Utilized During Treatment: Gait belt Activity Tolerance: Patient tolerated treatment well Patient left: in chair (OT at bedside for eval) Nurse Communication: Mobility status PT Visit Diagnosis: Muscle weakness (generalized) (M62.81);Difficulty in walking, not elsewhere classified (R26.2)    Time: [id number] PT Time Calculation (min) (ACUTE ONLY): 16 min   Charges:   PT Evaluation $PT Eval Low Complexity: 1 Low          Addison Freimuth H. Owens Shark, PT, DPT, NCS 07/11/22, 2:22 PM [phone]

## 2022-07-11 NOTE — Evaluation (Signed)
Occupational Therapy Evaluation Patient Details Name: Shirley Nichols MRN: [id number] DOB: 06-09-1940 Today's Date: 07/11/2022   History of Present Illness presented to ER after passing out at medical appt; admitted for management of syncope/dehydration   Clinical Impression   Patient received in room working with PT. Both agreeable to OT (handoff provided). Pt presenting with decreased independence in self care, balance, functional mobility/transfers, and endurance. PTA pt lived with spouse, was independent for ADLs/IADLs, and independent for functional mobility without an AD. Pt followed commands well t/o session, A&Ox4. She denied any pain, dizziness, or lightheadedness with activity. Pt endorsing fatigue after working with PT and requesting to lay back down. Pt able to stand from recliner with supervision and then completed functional mobility at room level with supervision-CGA + RW. Pt demonstrated seated LB dressing with set up-supervision utilizing figure 4 technique. She then returned to supine with Mod I. Pt will benefit from acute OT to increase overall independence in the areas of ADLs and functional mobility in order to safely discharge home. Pt could benefit from [facility] following D/C to decrease falls risk, improve balance, and maximize independence in self-care within own home environment.    Recommendations for follow up therapy are one component of a multi-disciplinary discharge planning process, led by the attending physician.  Recommendations may be updated based on patient status, additional functional criteria and insurance authorization.   Follow Up Recommendations  Home health OT     Assistance Recommended at Discharge PRN  Patient can return home with the following A little help with bathing/dressing/bathroom;Assistance with cooking/housework;Assist for transportation;Help with stairs or ramp for entrance;A little help with walking and/or transfers    Functional Status  Assessment  Patient has had a recent decline in their functional status and demonstrates the ability to make significant improvements in function in a reasonable and predictable amount of time.  Equipment Recommendations  None recommended by OT    Recommendations for Other Services       Precautions / Restrictions Precautions Precautions: Fall Restrictions Weight Bearing Restrictions: No      Mobility Bed Mobility Overal bed mobility: Modified Independent             General bed mobility comments: for sit to supine    Transfers Overall transfer level: Needs assistance Equipment used: None Transfers: Sit to/from Stand Sit to Stand: Supervision                  Balance Overall balance assessment: Needs assistance Sitting-balance support: No upper extremity supported, Feet supported Sitting balance-Leahy Scale: Good     Standing balance support: No upper extremity supported Standing balance-Leahy Scale: Fair                             ADL either performed or assessed with clinical judgement   ADL Overall ADL's : Needs assistance/impaired Eating/Feeding: Modified independent;Sitting   Grooming: Set up;Supervision/safety;Standing           Upper Body Dressing : Set up;Sitting   Lower Body Dressing: Set up;Supervision/safety;Sitting/lateral leans Lower Body Dressing Details (indicate cue type and reason): to don/doff socks while sitting EOB using figure 4 technique, anticipate Min guard in standing for safety Toilet Transfer: Ambulation;Supervision/safety;Min guard;Rolling walker (2 wheels) Toilet Transfer Details (indicate cue type and reason): simulated, short ambulatory transfer from recliner>EOB         Functional mobility during ADLs: Supervision/safety;Min guard;Rolling walker (2 wheels) (~15 ft from recliner around  end of bed)       Vision Patient Visual Report: No change from baseline       Perception     Praxis       Pertinent Vitals/Pain Pain Assessment Pain Assessment: No/denies pain     Hand Dominance Right   Extremity/Trunk Assessment Upper Extremity Assessment Upper Extremity Assessment: Overall WFL for tasks assessed   Lower Extremity Assessment Lower Extremity Assessment: Overall WFL for tasks assessed       Communication Communication Communication: No difficulties   Cognition Arousal/Alertness: Awake/alert Behavior During Therapy: WFL for tasks assessed/performed Overall Cognitive Status: Within Functional Limits for tasks assessed                                       General Comments       Exercises Other Exercises Other Exercises: OT provided education re: role of OT, OT POC, post acute recs, sitting up for all meals, EOB/OOB mobility with assistance, home/fall safety.     Shoulder Instructions      Home Living Family/patient expects to be discharged to:: Private residence Living Arrangements: Spouse/significant other Available Help at Discharge: Family;Available 24 hours/day Type of Home: House Home Access: Stairs to enter [employer] of Steps: 3 Entrance Stairs-Rails: Left Home Layout: One level     Bathroom Shower/Tub: Occupational psychologist: Standard     Home Equipment: Rollator (4 wheels);Shower seat;Grab bars - tub/shower;Toilet riser          Prior Functioning/Environment Prior Level of Function : Independent/Modified Independent;Driving             Mobility Comments: IND for household and limited community mobilization without assist device; denies fall history. ADLs Comments: IND for ADLs/IADLs        OT Problem List: Decreased activity tolerance;Impaired balance (sitting and/or standing);Decreased safety awareness;Decreased knowledge of use of DME or AE;Decreased knowledge of precautions      OT Treatment/Interventions: Self-care/ADL training;Therapeutic exercise;Therapeutic activities;Energy  conservation;DME and/or AE instruction;Patient/family education;Balance training    OT Goals(Current goals can be found in the care plan section) Acute Rehab OT Goals Patient Stated Goal: return home OT Goal Formulation: With patient Time For Goal Achievement: 07/25/22 Potential to Achieve Goals: Good   OT Frequency: Min 2X/week    Co-evaluation              AM-PAC OT "6 Clicks" Daily Activity     Outcome Measure Help from another person eating meals?: None Help from another person taking care of personal grooming?: A Little Help from another person toileting, which includes using toliet, bedpan, or urinal?: A Little Help from another person bathing (including washing, rinsing, drying)?: A Little Help from another person to put on and taking off regular upper body clothing?: A Little Help from another person to put on and taking off regular lower body clothing?: A Little 6 Click Score: 19   End of Session Equipment Utilized During Treatment: Gait belt;Rolling walker (2 wheels) Nurse Communication: Mobility status  Activity Tolerance: Patient tolerated treatment well Patient left: in bed;with call bell/phone within reach;with bed alarm set  OT Visit Diagnosis: Unsteadiness on feet (R26.81);Muscle weakness (generalized) (M62.81)                Time: [id number] OT Time Calculation (min): 14 min Charges:  OT General Charges $OT Visit: 1 Visit OT Evaluation $OT Eval Low Complexity: 1 Low  Doneta Public MS, OTR/L ascom [phone]  07/11/22, 3:07 PM

## 2022-07-11 NOTE — Progress Notes (Signed)
Initial Nutrition Assessment  DOCUMENTATION CODES:   Non-severe (moderate) malnutrition in context of chronic illness  INTERVENTION:   -Liberalize diet to regular for widest variety of meal selections -MVI with minerals daily -Ensure Enlive po BID, each supplement provides 350 kcal and 20 grams of protein  NUTRITION DIAGNOSIS:   Moderate Malnutrition related to chronic illness (pancreatic cancer) as evidenced by mild fat depletion, moderate fat depletion, mild muscle depletion, moderate muscle depletion.  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  Pt admitted with syncope and dehydration.   Reviewed I/O's: +722 ml x 24 hours  UOP: 200 ml x 24 hours  Case discussed with RN, who reports pt may discharge home later today. Pt took medications this AM without difficulty.   Spoke with pt and daughter at bedside. Pt reports feeling a little better today, but still feels weak. Pt has called down for breakfast and waiting on tray. Daughter reports pt has  fair appetite PTA, but has improved greatly since starting marinol. Pt consumed 2 meals per day (Breakfast: eggs, sausage, toast, and fruit; Lunch: sandwich; Dinner: meat, starch, and vegetable). Pt's husband cares for her at home and cooks all of her meals.   Pt is very independent at home, but admits to feeling weaker. Pt has been tolerating radiation treatments well, but was told by her oncologist that these last 6 treatments tend to be "the worst" (pt started the first of her last 6 treatments on 07/07/22).   Pt reports her UBW is around 140# and endorses significant wt loss over the past few months secondary to cancer treatments. Per  daughter, pt has been able to maintain wt of 112-113# over the past few weeks. Reviewed wt hx; pt has experienced a 7% wt loss over the past 3 months. While this is not significant for time frame, it is concerting given advanced age and cancer treatments.   Discussed importance of good meal and supplement intake to promote healing. Pt amendable to Ensure.   Medications reviewed and include magnesium oxide, megace, and 0.9% NaCl with KCl 40 mEq/L infusion @ 150 ml/hr.   Lab Results  Component Value Date   HGBA1C 5.9 (H) 03/15/2022   PTA DM medications are none.   Labs reviewed: Mg: 1.5, CBGS: 121 (inpatient orders for glycemic control are none).    NUTRITION - FOCUSED PHYSICAL EXAM:  Flowsheet Row Most Recent Value  Orbital Region Mild depletion  Upper Arm Region Moderate depletion  Thoracic and Lumbar Region Mild depletion  Buccal Region Mild depletion  Temple Region Mild depletion  Clavicle Bone Region Moderate depletion  Clavicle and Acromion Bone Region Moderate depletion  Scapular Bone Region Moderate depletion  Dorsal Hand Moderate depletion  Patellar Region Moderate depletion  Anterior Thigh Region Moderate depletion  Posterior Calf Region Moderate depletion  Edema (RD Assessment) None  Hair Reviewed  Eyes Reviewed  Mouth Reviewed  Skin Reviewed  Nails Reviewed       Diet Order:   Diet Order             Diet regular Room service appropriate? Yes; Fluid consistency: Thin  Diet effective now  EDUCATION NEEDS:   Education needs have been addressed  Skin:  Skin Assessment: Reviewed RN Assessment  Last BM:  07/09/22  Height:   Ht Readings from Last 1 Encounters:  07/10/22 5' 4"$  (1.626 m)    Weight:   Wt Readings from Last 1 Encounters:  07/11/22 52 kg    Ideal Body Weight:  54.5 kg  BMI:  Body mass index is 19.68 kg/m.  Estimated Nutritional Needs:   Kcal:  1650-1850  Protein:  85-100 grams  Fluid:  > 1.6  L    Loistine Chance, RD, LDN, [place] Registered Dietitian II Certified Diabetes Care and Education Specialist Please refer to [facility] for RD and/or RD on-call/weekend/after hours pager

## 2022-07-11 NOTE — Telephone Encounter (Signed)
omponent Ref Range & Units 04:44 (07/11/22) 1 d ago (07/10/22) 1 d ago (07/10/22) 3 wk ago (06/14/22) 1 mo ago (06/09/22) 1 mo ago (05/25/22) 1 mo ago (05/17/22)  Potassium 3.5 - 5.1 mmol/L 4.0  2.7 Low Panic  CM 3.6 3.3 Low  3.6 5.3 High

## 2022-07-12 ENCOUNTER — Ambulatory Visit: Payer: PPO

## 2022-07-13 ENCOUNTER — Ambulatory Visit: Payer: PPO

## 2022-07-14 ENCOUNTER — Ambulatory Visit: Payer: PPO

## 2022-07-17 ENCOUNTER — Ambulatory Visit
Admission: RE | Admit: 2022-07-17 | Discharge: 2022-07-17 | Disposition: A | Discharge status: Home / Self Care | Payer: PPO | Source: Ambulatory Visit | Attending: Radiation Oncology | Admitting: Radiation Oncology

## 2022-07-17 ENCOUNTER — Other Ambulatory Visit: Payer: Self-pay

## 2022-07-17 ENCOUNTER — Ambulatory Visit: Payer: PPO

## 2022-07-17 DIAGNOSIS — C250 Malignant neoplasm of head of pancreas: Secondary | ICD-10-CM | POA: Diagnosis not present

## 2022-07-17 LAB — RAD ONC ARIA SESSION SUMMARY
Course Elapsed Days: 41
Plan Fractions Treated to Date: 25
Plan Prescribed Dose Per Fraction: 1.8 Gy
Plan Total Fractions Prescribed: 30
Plan Total Prescribed Dose: 54 Gy
Reference Point Dosage Given to Date: 45 Gy
Reference Point Session Dosage Given: 1.8 Gy
Session Number: 25

## 2022-07-18 ENCOUNTER — Other Ambulatory Visit: Payer: Self-pay

## 2022-07-18 ENCOUNTER — Ambulatory Visit: Payer: PPO

## 2022-07-18 ENCOUNTER — Ambulatory Visit
Admission: RE | Admit: 2022-07-18 | Discharge: 2022-07-18 | Disposition: A | Discharge status: Home / Self Care | Payer: PPO | Source: Ambulatory Visit | Attending: Radiation Oncology | Admitting: Radiation Oncology

## 2022-07-18 ENCOUNTER — Other Ambulatory Visit (HOSPITAL_COMMUNITY): Payer: Self-pay

## 2022-07-18 DIAGNOSIS — C250 Malignant neoplasm of head of pancreas: Secondary | ICD-10-CM | POA: Diagnosis not present

## 2022-07-18 LAB — RAD ONC ARIA SESSION SUMMARY
Course Elapsed Days: 42
Plan Fractions Treated to Date: 26
Plan Prescribed Dose Per Fraction: 1.8 Gy
Plan Total Fractions Prescribed: 30
Plan Total Prescribed Dose: 54 Gy
Reference Point Dosage Given to Date: 46.8 Gy
Reference Point Session Dosage Given: 1.8 Gy
Session Number: 26

## 2022-07-19 ENCOUNTER — Ambulatory Visit
Admission: RE | Admit: 2022-07-19 | Discharge: 2022-07-19 | Disposition: A | Discharge status: Home / Self Care | Payer: PPO | Source: Ambulatory Visit | Attending: Radiation Oncology | Admitting: Radiation Oncology

## 2022-07-19 ENCOUNTER — Other Ambulatory Visit: Payer: Self-pay

## 2022-07-19 DIAGNOSIS — C250 Malignant neoplasm of head of pancreas: Secondary | ICD-10-CM | POA: Diagnosis not present

## 2022-07-19 LAB — RAD ONC ARIA SESSION SUMMARY
Course Elapsed Days: 43
Plan Fractions Treated to Date: 27
Plan Prescribed Dose Per Fraction: 1.8 Gy
Plan Total Fractions Prescribed: 30
Plan Total Prescribed Dose: 54 Gy
Reference Point Dosage Given to Date: 48.6 Gy
Reference Point Session Dosage Given: 1.8 Gy
Session Number: 27

## 2022-07-20 ENCOUNTER — Other Ambulatory Visit: Payer: Self-pay

## 2022-07-20 ENCOUNTER — Ambulatory Visit
Admission: RE | Admit: 2022-07-20 | Discharge: 2022-07-20 | Disposition: A | Discharge status: Home / Self Care | Payer: PPO | Source: Ambulatory Visit | Attending: Radiation Oncology | Admitting: Radiation Oncology

## 2022-07-20 DIAGNOSIS — C250 Malignant neoplasm of head of pancreas: Secondary | ICD-10-CM | POA: Diagnosis not present

## 2022-07-20 LAB — RAD ONC ARIA SESSION SUMMARY
Course Elapsed Days: 44
Plan Fractions Treated to Date: 28
Plan Prescribed Dose Per Fraction: 1.8 Gy
Plan Total Fractions Prescribed: 30
Plan Total Prescribed Dose: 54 Gy
Reference Point Dosage Given to Date: 50.4 Gy
Reference Point Session Dosage Given: 1.8 Gy
Session Number: 28

## 2022-07-21 ENCOUNTER — Other Ambulatory Visit: Payer: Self-pay

## 2022-07-21 ENCOUNTER — Ambulatory Visit
Admission: RE | Admit: 2022-07-21 | Discharge: 2022-07-21 | Disposition: A | Discharge status: Home / Self Care | Payer: PPO | Source: Ambulatory Visit | Attending: Radiation Oncology | Admitting: Radiation Oncology

## 2022-07-21 DIAGNOSIS — C250 Malignant neoplasm of head of pancreas: Secondary | ICD-10-CM | POA: Diagnosis not present

## 2022-07-21 LAB — RAD ONC ARIA SESSION SUMMARY
Course Elapsed Days: 45
Plan Fractions Treated to Date: 29
Plan Prescribed Dose Per Fraction: 1.8 Gy
Plan Total Fractions Prescribed: 30
Plan Total Prescribed Dose: 54 Gy
Reference Point Dosage Given to Date: 52.2 Gy
Reference Point Session Dosage Given: 1.8 Gy
Session Number: 29

## 2022-07-24 ENCOUNTER — Ambulatory Visit
Admission: RE | Admit: 2022-07-24 | Discharge: 2022-07-24 | Disposition: A | Discharge status: Home / Self Care | Payer: PPO | Source: Ambulatory Visit | Attending: Radiation Oncology | Admitting: Radiation Oncology

## 2022-07-24 ENCOUNTER — Other Ambulatory Visit: Payer: Self-pay

## 2022-07-24 DIAGNOSIS — C250 Malignant neoplasm of head of pancreas: Secondary | ICD-10-CM | POA: Diagnosis not present

## 2022-07-24 LAB — RAD ONC ARIA SESSION SUMMARY
Course Elapsed Days: 48
Plan Fractions Treated to Date: 30
Plan Prescribed Dose Per Fraction: 1.8 Gy
Plan Total Fractions Prescribed: 30
Plan Total Prescribed Dose: 54 Gy
Reference Point Dosage Given to Date: 54 Gy
Reference Point Session Dosage Given: 1.8 Gy
Session Number: 30

## 2022-07-25 ENCOUNTER — Other Ambulatory Visit: Payer: Self-pay

## 2022-07-28 ENCOUNTER — Encounter: Payer: Self-pay | Admitting: Internal Medicine

## 2022-07-28 ENCOUNTER — Inpatient Hospital Stay: Payer: PPO | Attending: Internal Medicine

## 2022-07-28 ENCOUNTER — Inpatient Hospital Stay (HOSPITAL_BASED_OUTPATIENT_CLINIC_OR_DEPARTMENT_OTHER): Payer: PPO | Admitting: Internal Medicine

## 2022-07-28 ENCOUNTER — Other Ambulatory Visit (HOSPITAL_COMMUNITY): Payer: Self-pay

## 2022-07-28 VITALS — BP 154/96 | HR 88 | Temp 97.8°F | Resp 20 | Wt 110.4 lb

## 2022-07-28 DIAGNOSIS — R531 Weakness: Secondary | ICD-10-CM

## 2022-07-28 DIAGNOSIS — E876 Hypokalemia: Secondary | ICD-10-CM | POA: Diagnosis not present

## 2022-07-28 DIAGNOSIS — C250 Malignant neoplasm of head of pancreas: Secondary | ICD-10-CM | POA: Diagnosis not present

## 2022-07-28 DIAGNOSIS — Z923 Personal history of irradiation: Secondary | ICD-10-CM | POA: Diagnosis not present

## 2022-07-28 DIAGNOSIS — D649 Anemia, unspecified: Secondary | ICD-10-CM

## 2022-07-28 DIAGNOSIS — Z79899 Other long term (current) drug therapy: Secondary | ICD-10-CM | POA: Diagnosis not present

## 2022-07-28 DIAGNOSIS — K8689 Other specified diseases of pancreas: Secondary | ICD-10-CM

## 2022-07-28 LAB — CBC WITH DIFFERENTIAL/PLATELET
Abs Immature Granulocytes: 0.04 10*3/uL (ref 0.00–0.07)
Basophils Absolute: 0 10*3/uL (ref 0.0–0.1)
Basophils Relative: 1 %
Eosinophils Absolute: 0.1 10*3/uL (ref 0.0–0.5)
Eosinophils Relative: 1 %
HCT: 33.4 % — ABNORMAL LOW (ref 36.0–46.0)
Hemoglobin: 10.4 g/dL — ABNORMAL LOW (ref 12.0–15.0)
Immature Granulocytes: 1 %
Lymphocytes Relative: 6 %
Lymphs Abs: 0.4 10*3/uL — ABNORMAL LOW (ref 0.7–4.0)
MCH: 25.6 pg — ABNORMAL LOW (ref 26.0–34.0)
MCHC: 31.1 g/dL (ref 30.0–36.0)
MCV: 82.1 fL (ref 80.0–100.0)
Monocytes Absolute: 0.4 10*3/uL (ref 0.1–1.0)
Monocytes Relative: 6 %
Neutro Abs: 5.6 10*3/uL (ref 1.7–7.7)
Neutrophils Relative %: 85 %
Platelets: 285 10*3/uL (ref 150–400)
RBC: 4.07 MIL/uL (ref 3.87–5.11)
RDW: 15.9 % — ABNORMAL HIGH (ref 11.5–15.5)
WBC: 6.5 10*3/uL (ref 4.0–10.5)
nRBC: 0 % (ref 0.0–0.2)

## 2022-07-28 LAB — COMPREHENSIVE METABOLIC PANEL
ALT: 49 U/L — ABNORMAL HIGH (ref 0–44)
AST: 28 U/L (ref 15–41)
Albumin: 3.3 g/dL — ABNORMAL LOW (ref 3.5–5.0)
Alkaline Phosphatase: 176 U/L — ABNORMAL HIGH (ref 38–126)
Anion gap: 12 (ref 5–15)
BUN: 23 mg/dL (ref 8–23)
CO2: 24 mmol/L (ref 22–32)
Calcium: 8.3 mg/dL — ABNORMAL LOW (ref 8.9–10.3)
Chloride: 100 mmol/L (ref 98–111)
Creatinine, Ser: 1 mg/dL (ref 0.44–1.00)
GFR, Estimated: 56 mL/min — ABNORMAL LOW (ref >60)
Glucose, Bld: 199 mg/dL — ABNORMAL HIGH (ref 70–99)
Potassium: 2.4 mmol/L — CL (ref 3.5–5.1)
Sodium: 136 mmol/L (ref 135–145)
Total Bilirubin: 0.4 mg/dL (ref 0.3–1.2)
Total Protein: 6.7 g/dL (ref 6.5–8.1)

## 2022-07-28 MED ORDER — POTASSIUM CHLORIDE CRYS ER 20 MEQ PO TBCR: 20.0000 meq | EXTENDED_RELEASE_TABLET | Freq: Two times a day (BID) | ORAL | 0 refills | Status: DC

## 2022-07-28 MED ORDER — PANCRELIPASE (LIP-PROT-AMYL) 36000-114000 UNITS PO CPEP
36000.0000 [IU] | ORAL_CAPSULE | Freq: Three times a day (TID) | ORAL | 3 refills | Status: DC
  Filled 2022-07-28 – 2022-07-31 (×3): qty 90, 30d supply, fill #0
  Filled 2022-08-22 – 2022-08-23 (×2): qty 90, 30d supply, fill #1

## 2022-07-28 NOTE — Progress Notes (Signed)
[facility] NOTE  Patient Care Team: Baxter Hire, MD as PCP - General (Internal Medicine) Clent Jacks, RN as Oncology Nurse Navigator Jane Canary, MD as Consulting Physician (Oncology)   CANCER STAGING   Cancer Staging  Pancreatic cancer [facility]) Staging form: Exocrine Pancreas, AJCC 8th Edition - Clinical stage from 03/09/2022: Stage IB (cT2, cN0, cM0) - Signed by Heath Lark, MD on 03/09/2022 Stage prefix: Initial diagnosis Total positive nodes: 0   ASSESSMENT & PLAN:  Shirley Nichols 82 y.o. female with pmh of hypertension and hyperlipidemia was seen for localized pancreatic cancer.  #Localized pancreatic cancer - MRCP on 03/03/2022 showed a 3.8 cm pancreatic head mass and biliary dilatation.  She had ERCP with dilatation of upper and mid third main bile duct with obstructing mass and 1 metal stent was placed.  Brushings were positive for adenocarcinoma. CT chest negative for metastatic disease.  -Patient is not a surgical candidate due to poor functional status.  No chemotherapy due to functional status. -Completed radiation 54 Gray in 30 fractions on 07/24/2022.  Patient had difficulty going through last week of the treatment with 1 episode of syncope requiring ED transfer.  She feels little weak today.  Discussed about repeating CT abdomen pelvis in the next 6 to 8 weeks to assess for treatment response.  -Foundation 1 CDX testing could not be performed due to insufficient sample.   -CA 19-9 908 from 04/17/22.  Repeat CA 19-9 today.  -Nutrition and palliative care on board.  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    # Hypokalemia -Potassium 2.4.  Advised to eat more bananas.  K-Lor 20 mEq twice daily for 7 days -Repeat BMP on Monday.  IV fluids and KCl.  # Hypomagnesemia -Mag 1.7.  Husband requesting to decrease magnesium dose.  Will go from 2 pills to 1 pill  twice daily. -Recheck magnesium next week.  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  # Memory issues -Follows with neurology Dr. Manuella Ghazi  Husband had concerns about the insurance requiring proper documentation of the pathology report.  I have received the claim number and fax from him and requested the staff to send the required information.  He also wanted a copy for himself and was advised to discuss with medical records.  Orders Placed This Encounter  Procedures   Basic Metabolic Panel - [place] Only    Standing Status:   Future    Standing Expiration Date:   [id number]   Basic Metabolic Panel - [place] Only    Standing Status:   Future    Standing Expiration Date:   07/28/2023   Magnesium    Standing Status:   Future    Standing Expiration Date:   07/28/2023   IV fluids, possible KCl and BMP on Monday RTC in 1 week for MD visit, labs and possible fluids.  The total time spent in the appointment was 30 minutes encounter with patients including review of chart and various tests results, discussions about plan of care and coordination of care plan   All questions were answered. The patient knows to call the clinic with any problems, questions or concerns. No barriers to learning was detected.  Jane Canary, MD 3/1/202412:59 PM   HISTORY OF PRESENTING ILLNESS:  Shirley Nichols 82 y.o. female with pmh of hypertension and hyperlipidemia was seen for localized pancreatic cancer.  Patient was  sent to ED by PCP in October 2023 for elevated liver enzymes and concern for biliary obstruction. She had MRCP on 03/03/2022 which showed a 3.8 cm pancreatic head mass and biliary dilatation.  She had ERCP with dilatation of upper and mid third main bile duct with obstructing mass and 1 metal stent was placed.  Brushings were positive for adenocarcinoma.  Was complicated by post ERCP pancreatitis.  Her hospital course was prolonged by acute  respiratory failure which required intubation.  There was suspicion for seizure and was started on Keppra and Dilantin by neurology.  MRI brain and LP was negative for malignancy.  CT chest was negative for metastatic disease.  She also had acute renal failure which improved with fluids.  Prior to hospitalization in October, patient was functional and very independent with her ADLs.  She was driving and doing things.  Interval history Patient seen today accompanied by husband.  Patient completed radiation to pancreas on 07/24/2022.  Last week was difficult for her.  She had an episode of syncope at the radiation oncology department and was transferred to ed.  She was found to have hypokalemia, hypomagnesemia and concerns for dehydration.  Today she feels weak.  She has an appetite to eat however has been having issues with loose stools describes as sticky floating on the toilet.  I have reviewed her chart and materials related to her cancer extensively and collaborated history with the patient. Summary of oncologic history is as follows: Oncology History  Pancreatic cancer ([place])  03/03/2022 Imaging   CT abdomen and pelvis  Hazy stranding about the pancreatic head suggestive of pancreatitis. This is incompletely evaluated without IV contrast. Dilated gallbladder and common bile duct. MRI/MRCP is recommended to evaluate for choledocholithiasis or obstructing mass.   03/03/2022 Imaging   MRI  Image degradation by motion artifact noted.   3.4 cm masslike density in the pancreatic head, highly suspicious for pancreatic carcinoma.    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 No evidence of choledocholithiasis.   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   03/05/2022 Pathology Results   FINAL MICROSCOPIC DIAGNOSIS:  - Malignant cells consistent with adenocarcinoma    03/05/2022 Procedure    ERCP - The major papilla appeared normal. - The upper third of the main bile duct and middle third of the main bile duct were dilated, with a mass causing an obstruction. - A biliary sphincterotomy was performed. - One covered metal stent was placed into the common bile duct. - Cells for cytology obtained in the lower third of the main duct.   03/09/2022 Cancer Staging   Staging form: Exocrine Pancreas, AJCC 8th Edition - Clinical stage from 03/09/2022: Stage IB (cT2, cN0, cM0) - Signed by Heath Lark, MD on 03/09/2022 Stage prefix: Initial diagnosis Total positive nodes: 0    Hospital Admission   Admit date: 04/17/2022 to 04/22/2022 Admission diagnosis: Generalized weakness and fatigue, poor p.o. intake. Additional comments: Repeat CT abdomen pelvis done for worsening transaminitis showed stable pancreatic mass and metallic stents in place.  No concerns for infection.  Venous Doppler showed right Baker's cyst.  Rehab on discharge declined by family.     MEDICAL HISTORY:  Past Medical History:  Diagnosis Date   Cancer [facility])    skin cancer   Hyperlipidemia    Hypertension     SURGICAL HISTORY: Past Surgical History:  Procedure Laterality Date  ABDOMINAL HYSTERECTOMY     BILIARY BRUSHING  03/05/2022   Procedure: BILIARY BRUSHING;  Surgeon: Carol Ada, MD;  Location: [facility] ENDOSCOPY;  Service: Gastroenterology;;   BILIARY STENT PLACEMENT  03/05/2022   Procedure: BILIARY STENT PLACEMENT;  Surgeon: Carol Ada, MD;  Location: [facility] ENDOSCOPY;  Service: Gastroenterology;;   COLONOSCOPY     02/21/2002 adenomatous polyp, 03/23/2011   COLONOSCOPY W/ POLYPECTOMY  02/2000   TA polyp removed from ascending colon.  performed through [facility] (? affiliate in [place])   COLONOSCOPY WITH PROPOFOL N/A 11/03/2016   Procedure: COLONOSCOPY WITH PROPOFOL;  Surgeon: Manya Silvas, MD;  Location: [facility] ENDOSCOPY;  Service: Endoscopy;  Laterality: N/A;   ERCP N/A 03/05/2022   Procedure:  ENDOSCOPIC RETROGRADE CHOLANGIOPANCREATOGRAPHY (ERCP);  Surgeon: Carol Ada, MD;  Location: [place];  Service: Gastroenterology;  Laterality: N/A;   HEMORROIDECTOMY     TONSILLECTOMY      SOCIAL HISTORY: Social History   Socioeconomic History   Marital status: Married    Spouse name: Not on file   Number of children: Not on file   Years of education: Not on file   Highest education level: Not on file  Occupational History   Not on file  Tobacco Use   Smoking status: Former   Smokeless tobacco: Never  Substance and Sexual Activity   Alcohol use: No   Drug use: No   Sexual activity: Yes  Other Topics Concern   Not on file  Social History Narrative   Not on file   Social Determinants of Health   Financial Resource Strain: Not on file  Food Insecurity: No Food Insecurity (07/10/2022)   Hunger Vital Sign    Worried About Running Out of Food in the Last Year: Never true    Ran Out of Food in the Last Year: Never true  Transportation Needs: No Transportation Needs (07/10/2022)   PRAPARE - Hydrologist (Medical): No    Lack of Transportation (Non-Medical): No  Physical Activity: Not on file  Stress: Not on file  Social Connections: Not on file  Intimate Partner Violence: Not At Risk (07/10/2022)   Humiliation, Afraid, Rape, and Kick questionnaire    Fear of Current or Ex-Partner: No    Emotionally Abused: No    Physically Abused: No    Sexually Abused: No    FAMILY HISTORY: Family History  Problem Relation Age of Onset   Seizures Sister     ALLERGIES:  has No Known Allergies.  MEDICATIONS:  Current Outpatient Medications  Medication Sig Dispense Refill   acetaminophen (TYLENOL) 325 MG tablet Take 2 tablets (650 mg total) by mouth every 6 (six) hours as needed for mild pain, fever or headache.     aspirin EC 81 MG tablet Take 81 mg by mouth daily.     donepezil (ARICEPT) 5 MG tablet Take 1 tablet (5 mg total) by mouth every  morning. 30 tablet 5   ezetimibe (ZETIA) 10 MG tablet Take 10 mg by mouth daily.     lipase/protease/amylase (CREON) 36000 UNITS CPEP capsule Take 1 capsule (36,000 Units total) by mouth 3 (three) times daily with meals. 90 capsule 3   magnesium oxide (MAG-OX) 400 MG tablet Take 2 tablets (800 mg total) by mouth 2 (two) times daily. 120 tablet 0   potassium chloride SA (KLOR-CON M) 20 MEQ tablet Take 1 tablet (20 mEq total) by mouth 2 (two) times daily for 7 days. 14 tablet 0   SYMBICORT 160-4.5  MCG/ACT inhaler Inhale 2 puffs into the lungs as needed (for wheezing or shortness of breath). 10.2 g 4   megestrol (MEGACE) 40 MG/ML suspension Take 400 mg by mouth daily. (Patient not taking: Reported on 05/25/2022)     No current facility-administered medications for this visit.    REVIEW OF SYSTEMS:   Pertinent information mentioned in HPI All other systems were reviewed with the patient and are negative.  PHYSICAL EXAMINATION: ECOG PERFORMANCE STATUS: 3 - Symptomatic, >50% confined to bed  Vitals:   07/28/22 1038  BP: (!) 154/96  Pulse: 88  Resp: 20  Temp: 97.8 F (36.6 C)  SpO2: 100%     Filed Weights   07/28/22 1038  Weight: 110 lb 6.4 oz (50.1 kg)      GENERAL:alert, no distress and comfortable SKIN: skin color, texture, turgor are normal, no rashes or significant lesions EYES: normal, conjunctiva are pink and non-injected, sclera clear OROPHARYNX:no exudate, no erythema and lips, buccal mucosa, and tongue normal  NECK: supple, thyroid normal size, non-tender, without nodularity LYMPH:  no palpable lymphadenopathy in the cervical, axillary or inguinal LUNGS: clear to auscultation and percussion with normal breathing effort HEART: regular rate & rhythm and no murmurs and no lower extremity edema ABDOMEN:abdomen soft, non-tender and normal bowel sounds Musculoskeletal:no cyanosis of digits and no clubbing  PSYCH: alert & oriented x 3 with fluent speech NEURO: no focal  motor/sensory deficits  LABORATORY DATA:  I have reviewed the data as listed Lab Results  Component Value Date   WBC 6.5 07/28/2022   HGB 10.4 (L) 07/28/2022   HCT 33.4 (L) 07/28/2022   MCV 82.1 07/28/2022   PLT 285 07/28/2022   Recent Labs    03/14/22 1430 03/14/22 1715 07/10/22 1116 07/10/22 1400 07/11/22 0444 07/28/22 1023  NA  --    < > 137  --  139 136  K  --    < > 2.7*  --  4.0 2.4*  CL  --    < > 98  --  106 100  CO2  --    < > 26  --  27 24  GLUCOSE  --    < > 136*  --  124* 199*  BUN  --    < > 25*  --  20 23  CREATININE  --    < > 0.92 0.85 0.88 1.00  CALCIUM  --    < > 8.6*  --  8.6* 8.3*  GFRNONAA  --    < > >60 >60 >60 56*  PROT  --    < > 6.2*  --  6.2* 6.7  ALBUMIN  --    < > 3.2*  --  3.2* 3.3*  AST  --    < > 73*  --  31 28  ALT  --    < > 71*  --  52* 49*  ALKPHOS  --    < > 256*  --  235* 176*  BILITOT  --    < > 0.7  --  0.7 0.4  BILIDIR 1.9*  --   --   --   --   --    < > = values in this interval not displayed.    RADIOGRAPHIC STUDIES: I have personally reviewed the radiological images as listed and agreed with the findings in the report. US Abdomen Limited RUQ (LIVER/GB)  Result Date: 07/10/2022 CLINICAL DATA:  Elevated LFTs. EXAM: ULTRASOUND ABDOMEN LIMITED RIGHT UPPER QUADRANT  COMPARISON:  CT of the abdomen and pelvis 04/17/2022 FINDINGS: Gallbladder: Gallbladder wall is thickened, 3.6 millimeters. Layering in the dependent aspect of the gallbladder, masslike sludge measures 2.6 centimeters. No radiopaque calculi identified. Common bile duct: Diameter: 3.7 millimeters. Stent is identified within the bile duct. Liver: The liver is normal in echotexture. Gas is identified within the intrahepatic biliary tree. No suspicious mass. Portal vein is patent on color Doppler imaging with normal direction of blood flow towards the liver. Other: None. IMPRESSION: 1. Layering tumefactive sludge in the gallbladder. 2. Gallbladder wall thickening. 3. Biliary  stent within the common bile duct. 4. Pneumobilia. Electronically Signed   By: Nolon Nations M.D.   On: 07/10/2022 15:46   CT HEAD WO CONTRAST (5MM)  Result Date: 07/10/2022 CLINICAL DATA:  Syncope/presyncope. Cerebrovascular cause suspected history of pancreatic cancer. EXAM: CT HEAD WITHOUT CONTRAST TECHNIQUE: Contiguous axial images were obtained from the base of the skull through the vertex without intravenous contrast. RADIATION DOSE REDUCTION: This exam was performed according to the departmental dose-optimization program which includes automated exposure control, adjustment of the mA and/or kV according to patient size and/or use of iterative reconstruction technique. COMPARISON:  03/10/2022 CT and MRI. FINDINGS: Brain: Age related atrophy. Chronic small-vessel ischemic changes of the white matter of a mild degree. No evidence of acute infarction, mass lesion, hemorrhage, hydrocephalus or extra-axial collection. Vascular: There is atherosclerotic calcification of the major vessels at the base of the brain. Skull: Negative Sinuses/Orbits: Clear/normal Other: None IMPRESSION: No acute CT finding. Age related atrophy. 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 Electronically Signed   By: Nelson Chimes M.D.   On: 07/10/2022 13:41

## 2022-07-28 NOTE — Patient Instructions (Signed)
Cut down Magnesium to 1 tablet twice a day   I will send a script for pancreatic enzymes to help with your bowel movements.

## 2022-07-29 LAB — CANCER ANTIGEN 19-9: CA 19-9: 8143 U/mL — ABNORMAL HIGH (ref 0–35)

## 2022-07-31 ENCOUNTER — Inpatient Hospital Stay: Payer: PPO

## 2022-07-31 ENCOUNTER — Other Ambulatory Visit (HOSPITAL_COMMUNITY): Payer: Self-pay

## 2022-07-31 DIAGNOSIS — C250 Malignant neoplasm of head of pancreas: Secondary | ICD-10-CM | POA: Diagnosis not present

## 2022-07-31 DIAGNOSIS — E876 Hypokalemia: Secondary | ICD-10-CM

## 2022-07-31 LAB — BASIC METABOLIC PANEL - CANCER CENTER ONLY
Anion gap: 9 (ref 5–15)
BUN: 24 mg/dL — ABNORMAL HIGH (ref 8–23)
CO2: 26 mmol/L (ref 22–32)
Calcium: 8.2 mg/dL — ABNORMAL LOW (ref 8.9–10.3)
Chloride: 103 mmol/L (ref 98–111)
Creatinine: 1.06 mg/dL — ABNORMAL HIGH (ref 0.44–1.00)
GFR, Estimated: 52 mL/min — ABNORMAL LOW (ref >60)
Glucose, Bld: 207 mg/dL — ABNORMAL HIGH (ref 70–99)
Potassium: 3.2 mmol/L — ABNORMAL LOW (ref 3.5–5.1)
Sodium: 138 mmol/L (ref 135–145)

## 2022-07-31 LAB — MAGNESIUM: Magnesium: 1.4 mg/dL — ABNORMAL LOW (ref 1.7–2.4)

## 2022-07-31 MED ORDER — MAGNESIUM SULFATE 2 GM/50ML IV SOLN
2.0000 g | Freq: Once | INTRAVENOUS | Status: AC
  Administered 2022-07-31: 2 g via INTRAVENOUS
  Filled 2022-07-31: qty 50

## 2022-07-31 MED ORDER — POTASSIUM CHLORIDE 10 MEQ/100ML IV SOLN
10.0000 meq | Freq: Once | INTRAVENOUS | Status: AC
  Administered 2022-07-31: 10 meq via INTRAVENOUS
  Filled 2022-07-31: qty 100

## 2022-07-31 MED ORDER — POTASSIUM CHLORIDE 10 MEQ/100ML IV SOLN
10.0000 meq | INTRAVENOUS | Status: AC
  Administered 2022-07-31: 10 meq via INTRAVENOUS
  Filled 2022-07-31: qty 100

## 2022-07-31 MED ORDER — SODIUM CHLORIDE 0.9 % IV SOLN
INTRAVENOUS | Status: DC
  Filled 2022-07-31 (×2): qty 250

## 2022-08-01 ENCOUNTER — Other Ambulatory Visit (HOSPITAL_COMMUNITY): Payer: Self-pay

## 2022-08-03 ENCOUNTER — Other Ambulatory Visit: Payer: Self-pay | Admitting: *Deleted

## 2022-08-03 ENCOUNTER — Encounter (HOSPITAL_COMMUNITY): Payer: Self-pay

## 2022-08-03 DIAGNOSIS — E876 Hypokalemia: Secondary | ICD-10-CM

## 2022-08-04 ENCOUNTER — Inpatient Hospital Stay: Payer: PPO

## 2022-08-04 ENCOUNTER — Inpatient Hospital Stay (HOSPITAL_BASED_OUTPATIENT_CLINIC_OR_DEPARTMENT_OTHER): Payer: PPO | Admitting: Internal Medicine

## 2022-08-04 ENCOUNTER — Other Ambulatory Visit: Payer: Self-pay | Admitting: *Deleted

## 2022-08-04 VITALS — BP 139/90 | HR 95 | Temp 98.1°F | Resp 18 | Ht 64.0 in | Wt 111.6 lb

## 2022-08-04 DIAGNOSIS — C250 Malignant neoplasm of head of pancreas: Secondary | ICD-10-CM

## 2022-08-04 DIAGNOSIS — E860 Dehydration: Secondary | ICD-10-CM | POA: Diagnosis not present

## 2022-08-04 DIAGNOSIS — E876 Hypokalemia: Secondary | ICD-10-CM | POA: Diagnosis not present

## 2022-08-04 LAB — BASIC METABOLIC PANEL - CANCER CENTER ONLY
Anion gap: 7 (ref 5–15)
BUN: 27 mg/dL — ABNORMAL HIGH (ref 8–23)
CO2: 24 mmol/L (ref 22–32)
Calcium: 8.5 mg/dL — ABNORMAL LOW (ref 8.9–10.3)
Chloride: 105 mmol/L (ref 98–111)
Creatinine: 1.15 mg/dL — ABNORMAL HIGH (ref 0.44–1.00)
GFR, Estimated: 48 mL/min — ABNORMAL LOW (ref >60)
Glucose, Bld: 165 mg/dL — ABNORMAL HIGH (ref 70–99)
Potassium: 3.7 mmol/L (ref 3.5–5.1)
Sodium: 136 mmol/L (ref 135–145)

## 2022-08-04 LAB — MAGNESIUM: Magnesium: 1.3 mg/dL — ABNORMAL LOW (ref 1.7–2.4)

## 2022-08-04 MED ORDER — MAGNESIUM SULFATE 2 GM/50ML IV SOLN
2.0000 g | Freq: Once | INTRAVENOUS | Status: AC
  Administered 2022-08-04: 2 g via INTRAVENOUS
  Filled 2022-08-04: qty 50

## 2022-08-04 MED ORDER — SODIUM CHLORIDE 0.9 % IV SOLN
Freq: Once | INTRAVENOUS | Status: AC
  Filled 2022-08-04: qty 250

## 2022-08-04 NOTE — Progress Notes (Signed)
[place] NOTE  Patient Care Team: Baxter Hire, MD as PCP - General (Internal Medicine) Clent Jacks, RN as Oncology Nurse Navigator Jane Canary, MD as Consulting Physician (Oncology)   CANCER STAGING   Cancer Staging  Pancreatic cancer [facility]) Staging form: Exocrine Pancreas, AJCC 8th Edition - Clinical stage from 03/09/2022: Stage IB (cT2, cN0, cM0) - Signed by Heath Lark, MD on 03/09/2022 Stage prefix: Initial diagnosis Total positive nodes: 0   ASSESSMENT & PLAN:  Shirley Nichols 82 y.o. female with pmh of hypertension and hyperlipidemia was seen for localized pancreatic cancer.  #Localized pancreatic cancer - MRCP on 03/03/2022 showed a 3.8 cm pancreatic head mass and biliary dilatation.  She had ERCP with dilatation of upper and mid third main bile duct with obstructing mass and 1 metal stent was placed.  Brushings were positive for adenocarcinoma. CT chest negative for metastatic disease.  -Patient is not a surgical and chemo candidate due to poor functional status.  Completed radiation 54 Gray in 30 fractions on 07/24/2022 had difficulty tolerating the last 2 weeks.  CA 19-9 has dramatically increased to around 8000.  I discussed that this could either be due to recent radiation treatment versus cancer progression.  I would like to obtain a CT abdomen pelvis with contrast in 1 to 2 weeks to reassess the pancreatic mass.  Patient feels better after hydration.  Will proceed with 500 cc normal saline.  -Foundation 1 CDX testing could not be performed due to insufficient sample.   -Nutrition and palliative care on board.  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  Improved -Continue with Creon 36,000 units with meals 3 times daily.    # Hypokalemia -Improved.  If her potassium drops again, I will start her on maintenance low-dose K-Lor.  # Hypomagnesemia -Mag 1.3.  Will proceed with IV 2 mg Mg -We have decreased her  oral magnesium sulfate from 2 pills to 1 pill twice a day due to frequent episodes of diarrhea.  Since the change, bowel movements have improved.  I also discussed about foods that are rich in magnesium and the need to intermittently supplemented via IV.  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  # Memory issues -Follows with neurology Dr. Manuella Ghazi   Orders Placed This Encounter  Procedures   CT Abdomen Pelvis W Contrast    Standing Status:   Future    Standing Expiration Date:   08/04/2023    Order Specific Question:   If indicated for the ordered procedure, I authorize the administration of contrast media per Radiology protocol    Answer:   Yes    Order Specific Question:   Does the patient have a contrast media/X-ray dye allergy?    Answer:   No    Order Specific Question:   Preferred imaging location?    Answer:   [facility]    Order Specific Question:   Is Oral Contrast requested for this exam?    Answer:   Yes, Per Radiology protocol   RTC in 1 week with APP, labs, fluids RTC in 2 weeks for MD visit, labs, fluids, discuss scans  The total time spent in the appointment was 30 minutes encounter with patients including review of chart and various tests results, discussions about plan of care and coordination of care plan   All questions were answered. The patient knows to call the clinic with any problems, questions or concerns. No barriers  to learning was detected.  Jane Canary, MD 3/8/20241:54 PM   HISTORY OF PRESENTING ILLNESS:  Shirley Nichols 82 y.o. female with pmh of hypertension and hyperlipidemia was seen for localized pancreatic cancer.  Patient was sent to ED by PCP in October 2023 for elevated liver enzymes and concern for biliary obstruction. She had MRCP on 03/03/2022 which showed a 3.8 cm pancreatic head mass and biliary dilatation.  She had ERCP with dilatation of upper and mid third main bile duct with obstructing  mass and 1 metal stent was placed.  Brushings were positive for adenocarcinoma.  Was complicated by post ERCP pancreatitis.  Her hospital course was prolonged by acute respiratory failure which required intubation.  There was suspicion for seizure and was started on Keppra and Dilantin by neurology.  MRI brain and LP was negative for malignancy.  CT chest was negative for metastatic disease.  She also had acute renal failure which improved with fluids.  Prior to hospitalization in October, patient was functional and very independent with her ADLs.  She was driving and doing things.  Interval history Patient seen today accompanied by husband.   Patient has noticed improvement in her diarrhea after decreasing magnesium from 2 pills to 1 pill twice a day.  Also after starting Creon, the bowel movements are not sticking to the toilet bowl.  She is eating well.  Husband express concern about her going to urinate 3-4 times at night.  This has been ongoing for few weeks.  Denies any dysuria or burning sensation.  Previously UA was checked and was negative.  I have reviewed her chart and materials related to her cancer extensively and collaborated history with the patient. Summary of oncologic history is as follows: Oncology History  Pancreatic cancer ([place])  03/03/2022 Imaging   CT abdomen and pelvis  Hazy stranding about the pancreatic head suggestive of pancreatitis. This is incompletely evaluated without IV contrast. Dilated gallbladder and common bile duct. MRI/MRCP is recommended to evaluate for choledocholithiasis or obstructing mass.   03/03/2022 Imaging   MRI  Image degradation by motion artifact noted.   3.4 cm masslike density in the pancreatic head, highly suspicious for pancreatic carcinoma.    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 No evidence of choledocholithiasis.   2.6 cm left adrenal mass is incompletely characterized, but  favors a benign hepatic adenoma.   03/05/2022 Pathology Results   FINAL MICROSCOPIC DIAGNOSIS:  - Malignant cells consistent with adenocarcinoma    03/05/2022 Procedure   ERCP - The major papilla appeared normal. - The upper third of the main bile duct and middle third of the main bile duct were dilated, with a mass causing an obstruction. - A biliary sphincterotomy was performed. - One covered metal stent was placed into the common bile duct. - Cells for cytology obtained in the lower third of the main duct.   03/09/2022 Cancer Staging   Staging form: Exocrine Pancreas, AJCC 8th Edition - Clinical stage from 03/09/2022: Stage IB (cT2, cN0, cM0) - Signed by Heath Lark, MD on 03/09/2022 Stage prefix: Initial diagnosis Total positive nodes: 0    Hospital Admission   Admit date: 04/17/2022 to 04/22/2022 Admission diagnosis: Generalized weakness and fatigue, poor p.o. intake. Additional comments: Repeat CT abdomen pelvis done for worsening transaminitis showed stable pancreatic mass and metallic stents in place.  No concerns for infection.  Venous Doppler showed right Baker's cyst.  Rehab on discharge declined by family.  MEDICAL HISTORY:  Past Medical History:  Diagnosis Date   Cancer ([place])    skin cancer   Hyperlipidemia    Hypertension     SURGICAL HISTORY: Past Surgical History:  Procedure Laterality Date   ABDOMINAL HYSTERECTOMY     BILIARY BRUSHING  03/05/2022   Procedure: BILIARY BRUSHING;  Surgeon: Carol Ada, MD;  Location[facility] ENDOSCOPY;  Service: Gastroenterology;;   BILIARY STENT PLACEMENT  03/05/2022   Procedure: BILIARY STENT PLACEMENT;  Surgeon: Carol Ada, MD;  Location: [facility] ENDOSCOPY;  Service: Gastroenterology;;   COLONOSCOPY     02/21/2002 adenomatous polyp, 03/23/2011   COLONOSCOPY W/ POLYPECTOMY  02/2000   TA polyp removed from ascending colon.  performed through [facility] (? affiliate in [place])   COLONOSCOPY WITH PROPOFOL N/A 11/03/2016    Procedure: COLONOSCOPY WITH PROPOFOL;  Surgeon: Manya Silvas, MD;  Location: [facility] ENDOSCOPY;  Service: Endoscopy;  Laterality: N/A;   ERCP N/A 03/05/2022   Procedure: ENDOSCOPIC RETROGRADE CHOLANGIOPANCREATOGRAPHY (ERCP);  Surgeon: Carol Ada, MD;  Location: [place];  Service: Gastroenterology;  Laterality: N/A;   HEMORROIDECTOMY     TONSILLECTOMY      SOCIAL HISTORY: Social History   Socioeconomic History   Marital status: Married    Spouse name: Not on file   Number of children: Not on file   Years of education: Not on file   Highest education level: Not on file  Occupational History   Not on file  Tobacco Use   Smoking status: Former   Smokeless tobacco: Never  Substance and Sexual Activity   Alcohol use: No   Drug use: No   Sexual activity: Yes  Other Topics Concern   Not on file  Social History Narrative   Not on file   Social Determinants of Health   Financial Resource Strain: Not on file  Food Insecurity: No Food Insecurity (07/10/2022)   Hunger Vital Sign    Worried About Running Out of Food in the Last Year: Never true    Ran Out of Food in the Last Year: Never true  Transportation Needs: No Transportation Needs (07/10/2022)   PRAPARE - Hydrologist (Medical): No    Lack of Transportation (Non-Medical): No  Physical Activity: Not on file  Stress: Not on file  Social Connections: Not on file  Intimate Partner Violence: Not At Risk (07/10/2022)   Humiliation, Afraid, Rape, and Kick questionnaire    Fear of Current or Ex-Partner: No    Emotionally Abused: No    Physically Abused: No    Sexually Abused: No    FAMILY HISTORY: Family History  Problem Relation Age of Onset   Seizures Sister     ALLERGIES:  has No Known Allergies.  MEDICATIONS:  Current Outpatient Medications  Medication Sig Dispense Refill   acetaminophen (TYLENOL) 325 MG tablet Take 2 tablets (650 mg total) by mouth every 6 (six) hours as needed  for mild pain, fever or headache.     aspirin EC 81 MG tablet Take 81 mg by mouth daily.     donepezil (ARICEPT) 5 MG tablet Take 1 tablet (5 mg total) by mouth every morning. 30 tablet 5   ezetimibe (ZETIA) 10 MG tablet Take 10 mg by mouth daily.     lipase/protease/amylase (CREON) 36000 UNITS CPEP capsule Take 1 capsule (36,000 Units total) by mouth 3 (three) times daily with meals. 90 capsule 3   megestrol (MEGACE) 40 MG/ML suspension Take 400 mg by mouth daily.  potassium chloride SA (KLOR-CON M) 20 MEQ tablet Take 1 tablet (20 mEq total) by mouth 2 (two) times daily for 7 days. 14 tablet 0   SYMBICORT 160-4.5 MCG/ACT inhaler Inhale 2 puffs into the lungs as needed (for wheezing or shortness of breath). 10.2 g 4   magnesium oxide (MAG-OX) 400 MG tablet Take 2 tablets (800 mg total) by mouth 2 (two) times daily. 120 tablet 0   No current facility-administered medications for this visit.    REVIEW OF SYSTEMS:   Pertinent information mentioned in HPI All other systems were reviewed with the patient and are negative.  PHYSICAL EXAMINATION: ECOG PERFORMANCE STATUS: 3 - Symptomatic, >50% confined to bed  Vitals:   08/04/22 1102  BP: (!) 139/90  Pulse: 95  Resp: 18  Temp: 98.1 F (36.7 C)  SpO2: 100%     Filed Weights   08/04/22 1102  Weight: 111 lb 9.6 oz (50.6 kg)      GENERAL:alert, no distress and comfortable SKIN: skin color, texture, turgor are normal, no rashes or significant lesions EYES: normal, conjunctiva are pink and non-injected, sclera clear OROPHARYNX:no exudate, no erythema and lips, buccal mucosa, and tongue normal  NECK: supple, thyroid normal size, non-tender, without nodularity LYMPH:  no palpable lymphadenopathy in the cervical, axillary or inguinal LUNGS: clear to auscultation and percussion with normal breathing effort HEART: regular rate & rhythm and no murmurs and no lower extremity edema ABDOMEN:abdomen soft, non-tender and normal bowel  sounds Musculoskeletal:no cyanosis of digits and no clubbing  PSYCH: alert & oriented x 3 with fluent speech NEURO: no focal motor/sensory deficits  LABORATORY DATA:  I have reviewed the data as listed Lab Results  Component Value Date   WBC 6.5 07/28/2022   HGB 10.4 (L) 07/28/2022   HCT 33.4 (L) 07/28/2022   MCV 82.1 07/28/2022   PLT 285 07/28/2022   Recent Labs    03/14/22 1430 03/14/22 1715 07/10/22 1116 07/10/22 1400 07/11/22 0444 07/28/22 1023 07/31/22 0917 08/04/22 1041  NA  --    < > 137  --  139 136 138 136  K  --    < > 2.7*  --  4.0 2.4* 3.2* 3.7  CL  --    < > 98  --  106 100 103 105  CO2  --    < > 26  --  '27 24 26 24  '$ GLUCOSE  --    < > 136*  --  124* 199* 207* 165*  BUN  --    < > 25*  --  20 23 24* 27*  CREATININE  --    < > 0.92   < > 0.88 1.00 1.06* 1.15*  CALCIUM  --    < > 8.6*  --  8.6* 8.3* 8.2* 8.5*  GFRNONAA  --    < > >60   < > >60 56* 52* 48*  PROT  --    < > 6.2*  --  6.2* 6.7  --   --   ALBUMIN  --    < > 3.2*  --  3.2* 3.3*  --   --   AST  --    < > 73*  --  31 28  --   --   ALT  --    < > 71*  --  52* 49*  --   --   ALKPHOS  --    < > 256*  --  235* 176*  --   --  BILITOT  --    < > 0.7  --  0.7 0.4  --   --   BILIDIR 1.9*  --   --   --   --   --   --   --    < > = values in this interval not displayed.    RADIOGRAPHIC STUDIES: I have personally reviewed the radiological images as listed and agreed with the findings in the report. US Abdomen Limited RUQ (LIVER/GB)  Result Date: 07/10/2022 CLINICAL DATA:  Elevated LFTs. EXAM: ULTRASOUND ABDOMEN LIMITED RIGHT UPPER QUADRANT COMPARISON:  CT of the abdomen and pelvis 04/17/2022 FINDINGS: Gallbladder: Gallbladder wall is thickened, 3.6 millimeters. Layering in the dependent aspect of the gallbladder, masslike sludge measures 2.6 centimeters. No radiopaque calculi identified. Common bile duct: Diameter: 3.7 millimeters. Stent is identified within the bile duct. Liver: The liver is normal in  echotexture. Gas is identified within the intrahepatic biliary tree. No suspicious mass. Portal vein is patent on color Doppler imaging with normal direction of blood flow towards the liver. Other: None. IMPRESSION: 1. Layering tumefactive sludge in the gallbladder. 2. Gallbladder wall thickening. 3. Biliary stent within the common bile duct. 4. Pneumobilia. Electronically Signed   By: Nolon Nations M.D.   On: 07/10/2022 15:46   CT HEAD WO CONTRAST (5MM)  Result Date: 07/10/2022 CLINICAL DATA:  Syncope/presyncope. Cerebrovascular cause suspected history of pancreatic cancer. EXAM: CT HEAD WITHOUT CONTRAST TECHNIQUE: Contiguous axial images were obtained from the base of the skull through the vertex without intravenous contrast. RADIATION DOSE REDUCTION: This exam was performed according to the departmental dose-optimization program which includes automated exposure control, adjustment of the mA and/or kV according to patient size and/or use of iterative reconstruction technique. COMPARISON:  03/10/2022 CT and MRI. FINDINGS: Brain: Age related atrophy. Chronic small-vessel ischemic changes of the white matter of a mild degree. No evidence of acute infarction, mass lesion, hemorrhage, hydrocephalus or extra-axial collection. Vascular: There is atherosclerotic calcification of the major vessels at the base of the brain. Skull: Negative Sinuses/Orbits: Clear/normal Other: None IMPRESSION: No acute CT finding. Age related atrophy. 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 Electronically Signed   By: Nelson Chimes M.D.   On: 07/10/2022 13:41

## 2022-08-04 NOTE — Patient Instructions (Signed)
Food rich in Magnesium -  Spinach  Peanut butter  Pumpkin seeds, chia seeds Nuts (almonds, cashews) Black beans

## 2022-08-04 NOTE — Progress Notes (Signed)
Patient has some concerns for the provider.patient has questions about magnesium.patient has taken last potassium medication.

## 2022-08-14 ENCOUNTER — Other Ambulatory Visit (HOSPITAL_COMMUNITY): Payer: Self-pay

## 2022-08-14 ENCOUNTER — Other Ambulatory Visit: Payer: Self-pay

## 2022-08-14 MED ORDER — EZETIMIBE 10 MG PO TABS
10.0000 mg | ORAL_TABLET | Freq: Every evening | ORAL | 3 refills | Status: DC
  Filled 2022-08-14: qty 90, 90d supply, fill #0

## 2022-08-15 ENCOUNTER — Ambulatory Visit
Admission: RE | Admit: 2022-08-15 | Discharge: 2022-08-15 | Disposition: A | Discharge status: Home / Self Care | Payer: PPO | Source: Ambulatory Visit | Attending: Internal Medicine | Admitting: Internal Medicine

## 2022-08-15 DIAGNOSIS — C250 Malignant neoplasm of head of pancreas: Secondary | ICD-10-CM | POA: Insufficient documentation

## 2022-08-15 MED ORDER — IOHEXOL 300 MG/ML  SOLN
80.0000 mL | Freq: Once | INTRAMUSCULAR | Status: AC | PRN
  Administered 2022-08-15: 80 mL via INTRAVENOUS

## 2022-08-18 ENCOUNTER — Inpatient Hospital Stay (HOSPITAL_BASED_OUTPATIENT_CLINIC_OR_DEPARTMENT_OTHER): Payer: PPO | Admitting: Internal Medicine

## 2022-08-18 ENCOUNTER — Inpatient Hospital Stay: Payer: PPO

## 2022-08-18 ENCOUNTER — Encounter: Payer: Self-pay | Admitting: Internal Medicine

## 2022-08-18 VITALS — BP 142/72 | HR 82 | Temp 97.9°F | Resp 15 | Wt 115.0 lb

## 2022-08-18 DIAGNOSIS — C250 Malignant neoplasm of head of pancreas: Secondary | ICD-10-CM

## 2022-08-18 DIAGNOSIS — E876 Hypokalemia: Secondary | ICD-10-CM

## 2022-08-18 LAB — CBC WITH DIFFERENTIAL/PLATELET
Abs Immature Granulocytes: 0.1 10*3/uL — ABNORMAL HIGH (ref 0.00–0.07)
Basophils Absolute: 0 10*3/uL (ref 0.0–0.1)
Basophils Relative: 0 %
Eosinophils Absolute: 0 10*3/uL (ref 0.0–0.5)
Eosinophils Relative: 0 %
HCT: 32.1 % — ABNORMAL LOW (ref 36.0–46.0)
Hemoglobin: 10.2 g/dL — ABNORMAL LOW (ref 12.0–15.0)
Immature Granulocytes: 1 %
Lymphocytes Relative: 4 %
Lymphs Abs: 0.5 10*3/uL — ABNORMAL LOW (ref 0.7–4.0)
MCH: 26.2 pg (ref 26.0–34.0)
MCHC: 31.8 g/dL (ref 30.0–36.0)
MCV: 82.3 fL (ref 80.0–100.0)
Monocytes Absolute: 0.2 10*3/uL (ref 0.1–1.0)
Monocytes Relative: 2 %
Neutro Abs: 10.6 10*3/uL — ABNORMAL HIGH (ref 1.7–7.7)
Neutrophils Relative %: 93 %
Platelets: 404 10*3/uL — ABNORMAL HIGH (ref 150–400)
RBC: 3.9 MIL/uL (ref 3.87–5.11)
RDW: 17.2 % — ABNORMAL HIGH (ref 11.5–15.5)
WBC: 11.5 10*3/uL — ABNORMAL HIGH (ref 4.0–10.5)
nRBC: 0 % (ref 0.0–0.2)

## 2022-08-18 LAB — MAGNESIUM: Magnesium: 1.4 mg/dL — ABNORMAL LOW (ref 1.7–2.4)

## 2022-08-18 LAB — COMPREHENSIVE METABOLIC PANEL
ALT: 38 U/L (ref 0–44)
AST: 37 U/L (ref 15–41)
Albumin: 3.1 g/dL — ABNORMAL LOW (ref 3.5–5.0)
Alkaline Phosphatase: 220 U/L — ABNORMAL HIGH (ref 38–126)
Anion gap: 8 (ref 5–15)
BUN: 28 mg/dL — ABNORMAL HIGH (ref 8–23)
CO2: 25 mmol/L (ref 22–32)
Calcium: 8 mg/dL — ABNORMAL LOW (ref 8.9–10.3)
Chloride: 105 mmol/L (ref 98–111)
Creatinine, Ser: 1.1 mg/dL — ABNORMAL HIGH (ref 0.44–1.00)
GFR, Estimated: 50 mL/min — ABNORMAL LOW (ref >60)
Glucose, Bld: 214 mg/dL — ABNORMAL HIGH (ref 70–99)
Potassium: 2.8 mmol/L — ABNORMAL LOW (ref 3.5–5.1)
Sodium: 138 mmol/L (ref 135–145)
Total Bilirubin: 0.5 mg/dL (ref 0.3–1.2)
Total Protein: 6.9 g/dL (ref 6.5–8.1)

## 2022-08-18 MED ORDER — MAGNESIUM SULFATE 2 GM/50ML IV SOLN
2.0000 g | Freq: Once | INTRAVENOUS | Status: AC
  Administered 2022-08-18: 2 g via INTRAVENOUS
  Filled 2022-08-18: qty 50

## 2022-08-18 MED ORDER — SODIUM CHLORIDE 0.9 % IV SOLN
INTRAVENOUS | Status: DC
  Filled 2022-08-18: qty 250

## 2022-08-18 MED ORDER — POTASSIUM CHLORIDE CRYS ER 20 MEQ PO TBCR: 20.0000 meq | EXTENDED_RELEASE_TABLET | Freq: Every day | ORAL | 1 refills | Status: DC

## 2022-08-18 NOTE — Progress Notes (Signed)
[place] NOTE  Patient Care Team: Baxter Hire, MD as PCP - General (Internal Medicine) Clent Jacks, RN as Oncology Nurse Navigator Jane Canary, MD as Consulting Physician (Oncology)   CANCER STAGING   Cancer Staging  Pancreatic cancer [facility]) Staging form: Exocrine Pancreas, AJCC 8th Edition - Clinical stage from 03/09/2022: Stage IV (cT2, cN0, cM1) - Signed by Jane Canary, MD on 08/18/2022 Stage prefix: Initial diagnosis Total positive nodes: 0   ASSESSMENT & PLAN:  Shirley Nichols 82 y.o. female with pmh of hypertension and hyperlipidemia was seen for localized pancreatic cancer.  #Localized pancreatic cancer - MRCP on 03/03/2022 showed a 3.8 cm pancreatic head mass and biliary dilatation.  She had ERCP with dilatation of upper and mid third main bile duct with obstructing mass and 1 metal stent was placed.  Brushings were positive for adenocarcinoma. Patient is not a surgical candidate due to poor functional status.  Completed radiation 54 Gray in 30 fractions on 07/24/2022 had difficulty tolerating the last 2 weeks.  CA 19-9 has dramatically increased to around 8000.  CT abdomen pelvis showed new liver lesions, increase in the pancreatic mass from 2.1 x 1.3 cm to 3.2 x 2.9 cm, increase in the size of lymph node posterior to the left common iliac from 5 mm to 7 mm and new left periaortic node consistent with metastatic disease.  I discussed the imaging findings with the patient and the husband in detail.  Her cancer unfortunately has now progressed to stage IV.  Did not respond to radiation therapy.  Goal of treatment will be palliative.  Discussed about treatment with low-dose single agent gemcitabine due to her functional status.  Side effects such as nausea, vomiting, decreased blood counts, fatigue, decreased appetite was discussed.  I also discussed about focusing on symptom management, comfort and hospice care.  They are understandably very  overwhelmed with the information provided to them today.  They would like to discuss the information with their daughters over the weekend.  I will follow-up with them via video visit on Monday.  Patient also has follow-up visit with palliative care on Monday.  -Foundation 1 CDX testing could not be performed due to insufficient sample.   -Nutrition and palliative care on board.  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  Improved -Continue with Creon 36,000 units with meals 3 times daily.    # Hypokalemia -K 2.8 today. K-Lor 20 mEq twice daily for 2 days followed by daily  # Hypomagnesemia -Mag 1.4.  Proceed with IV mag 2 g today. -Increase magnesium supplements from 1 pills to 2 pills twice a day.  If she starts having diarrhea they should cut back to 1 pill.  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  # Memory issues -Follows with neurology Dr. Manuella Ghazi   Orders Placed This Encounter  Procedures   CBC with Differential/Platelet    Standing Status:   Future    Number of Occurrences:   1    Standing Expiration Date:   08/18/2023   RTC on Monday via video visit  The total time spent in the appointment was 30 minutes encounter with patients including review of chart and various tests results, discussions about plan of care and coordination of care plan   All questions were answered. The patient knows to call the clinic with any problems, questions or concerns. No barriers to learning was detected.  Jane Canary, MD 3/22/202411:13 AM  HISTORY OF PRESENTING ILLNESS:  Shirley Nichols 82 y.o. female with pmh of hypertension and hyperlipidemia was seen for localized pancreatic cancer.  Patient was sent to ED by PCP in October 2023 for elevated liver enzymes and concern for biliary obstruction. She had MRCP on 03/03/2022 which showed a 3.8 cm pancreatic head mass and biliary dilatation.  She had ERCP with dilatation of  upper and mid third main bile duct with obstructing mass and 1 metal stent was placed.  Brushings were positive for adenocarcinoma.  Was complicated by post ERCP pancreatitis.  Her hospital course was prolonged by acute respiratory failure which required intubation.  There was suspicion for seizure and was started on Keppra and Dilantin by neurology.  MRI brain and LP was negative for malignancy.  CT chest was negative for metastatic disease.  She also had acute renal failure which improved with fluids.  Prior to hospitalization in October, patient was functional and very independent with her ADLs.  She was driving and doing things.  Interval history Patient seen today accompanied by husband.   Symptomatically patient has been feeling better.  She denies any pain.  Her bowel movements have improved after adding Creon.  She is only going once a day.  Eating drinking well.  Has mild swelling in bilateral foot.  Not in the legs.  I have reviewed her chart and materials related to her cancer extensively and collaborated history with the patient. Summary of oncologic history is as follows: Oncology History  Pancreatic cancer ([place])  03/03/2022 Imaging   CT abdomen and pelvis  Hazy stranding about the pancreatic head suggestive of pancreatitis. This is incompletely evaluated without IV contrast. Dilated gallbladder and common bile duct. MRI/MRCP is recommended to evaluate for choledocholithiasis or obstructing mass.   03/03/2022 Imaging   MRI  Image degradation by motion artifact noted.   3.4 cm masslike density in the pancreatic head, highly suspicious for pancreatic carcinoma.    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 No evidence of choledocholithiasis.   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   03/05/2022 Pathology Results   FINAL MICROSCOPIC DIAGNOSIS:  - Malignant cells  consistent with adenocarcinoma    03/05/2022 Procedure   ERCP - The major papilla appeared normal. - The upper third of the main bile duct and middle third of the main bile duct were dilated, with a mass causing an obstruction. - A biliary sphincterotomy was performed. - One covered metal stent was placed into the common bile duct. - Cells for cytology obtained in the lower third of the main duct.   03/09/2022 Cancer Staging   Staging form: Exocrine Pancreas, AJCC 8th Edition - Clinical stage from 03/09/2022: Stage IV (cT2, cN0, cM1) - Signed by Jane Canary, MD on 08/18/2022 Stage prefix: Initial diagnosis Total positive nodes: 0    Hospital Admission   Admit date: 04/17/2022 to 04/22/2022 Admission diagnosis: Generalized weakness and fatigue, poor p.o. intake. Additional comments: Repeat CT abdomen pelvis done for worsening transaminitis showed stable pancreatic mass and metallic stents in place.  No concerns for infection.  Venous Doppler showed right Baker's cyst.  Rehab on discharge declined by family.     MEDICAL HISTORY:  Past Medical History:  Diagnosis Date   Cancer [facility])    skin cancer   Hyperlipidemia    Hypertension     SURGICAL HISTORY: Past Surgical History:  Procedure Laterality Date  ABDOMINAL HYSTERECTOMY     BILIARY BRUSHING  03/05/2022   Procedure: BILIARY BRUSHING;  Surgeon: Carol Ada, MD;  Location: [facility] ENDOSCOPY;  Service: Gastroenterology;;   BILIARY STENT PLACEMENT  03/05/2022   Procedure: BILIARY STENT PLACEMENT;  Surgeon: Carol Ada, MD;  Location: [facility] ENDOSCOPY;  Service: Gastroenterology;;   COLONOSCOPY     02/21/2002 adenomatous polyp, 03/23/2011   COLONOSCOPY W/ POLYPECTOMY  02/2000   TA polyp removed from ascending colon.  performed through [facility] (? affiliate in [place])   COLONOSCOPY WITH PROPOFOL N/A 11/03/2016   Procedure: COLONOSCOPY WITH PROPOFOL;  Surgeon: Manya Silvas, MD;  Location: [facility] ENDOSCOPY;  Service: Endoscopy;   Laterality: N/A;   ERCP N/A 03/05/2022   Procedure: ENDOSCOPIC RETROGRADE CHOLANGIOPANCREATOGRAPHY (ERCP);  Surgeon: Carol Ada, MD;  Location: [place];  Service: Gastroenterology;  Laterality: N/A;   HEMORROIDECTOMY     TONSILLECTOMY      SOCIAL HISTORY: Social History   Socioeconomic History   Marital status: Married    Spouse name: Not on file   Number of children: Not on file   Years of education: Not on file   Highest education level: Not on file  Occupational History   Not on file  Tobacco Use   Smoking status: Former   Smokeless tobacco: Never  Substance and Sexual Activity   Alcohol use: No   Drug use: No   Sexual activity: Yes  Other Topics Concern   Not on file  Social History Narrative   Not on file   Social Determinants of Health   Financial Resource Strain: Not on file  Food Insecurity: No Food Insecurity (07/10/2022)   Hunger Vital Sign    Worried About Running Out of Food in the Last Year: Never true    Ran Out of Food in the Last Year: Never true  Transportation Needs: No Transportation Needs (07/10/2022)   PRAPARE - Hydrologist (Medical): No    Lack of Transportation (Non-Medical): No  Physical Activity: Not on file  Stress: Not on file  Social Connections: Not on file  Intimate Partner Violence: Not At Risk (07/10/2022)   Humiliation, Afraid, Rape, and Kick questionnaire    Fear of Current or Ex-Partner: No    Emotionally Abused: No    Physically Abused: No    Sexually Abused: No    FAMILY HISTORY: Family History  Problem Relation Age of Onset   Seizures Sister     ALLERGIES:  has No Known Allergies.  MEDICATIONS:  Current Outpatient Medications  Medication Sig Dispense Refill   acetaminophen (TYLENOL) 325 MG tablet Take 2 tablets (650 mg total) by mouth every 6 (six) hours as needed for mild pain, fever or headache.     aspirin EC 81 MG tablet Take 81 mg by mouth daily.     donepezil (ARICEPT) 5 MG  tablet Take 1 tablet (5 mg total) by mouth every morning. 30 tablet 5   ezetimibe (ZETIA) 10 MG tablet Take 1 tablet (10 mg total) by mouth every evening. 90 tablet 3   lipase/protease/amylase (CREON) 36000 UNITS CPEP capsule Take 1 capsule (36,000 Units total) by mouth 3 (three) times daily with meals. 90 capsule 3   SYMBICORT 160-4.5 MCG/ACT inhaler Inhale 2 puffs into the lungs as needed (for wheezing or shortness of breath). 10.2 g 4   ezetimibe (ZETIA) 10 MG tablet Take 10 mg by mouth daily.     megestrol (MEGACE) 40 MG/ML suspension Take 400 mg by mouth  2 (two) times daily.     potassium chloride SA (KLOR-CON M) 20 MEQ tablet Take 1 tablet (20 mEq total) by mouth daily. Take 1 pill two times a day for 2 days then one pill daily. 30 tablet 1   No current facility-administered medications for this visit.   Facility-Administered Medications Ordered in Other Visits  Medication Dose Route Frequency Provider Last Rate Last Admin   0.9 %  sodium chloride infusion   Intravenous Continuous Jane Canary, MD       magnesium sulfate IVPB 2 g 50 mL  2 g Intravenous Once Jane Canary, MD        REVIEW OF SYSTEMS:   Pertinent information mentioned in HPI All other systems were reviewed with the patient and are negative.  PHYSICAL EXAMINATION: ECOG PERFORMANCE STATUS: 3 - Symptomatic, >50% confined to bed  Vitals:   08/18/22 1003  BP: (!) 142/72  Pulse: 82  Resp: 15  Temp: 97.9 F (36.6 C)  SpO2: 100%      Filed Weights   08/18/22 1003  Weight: 115 lb (52.2 kg)       GENERAL:alert, no distress and comfortable SKIN: skin color, texture, turgor are normal, no rashes or significant lesions EYES: normal, conjunctiva are pink and non-injected, sclera clear OROPHARYNX:no exudate, no erythema and lips, buccal mucosa, and tongue normal  NECK: supple, thyroid normal size, non-tender, without nodularity LYMPH:  no palpable lymphadenopathy in the cervical, axillary or  inguinal LUNGS: clear to auscultation and percussion with normal breathing effort HEART: regular rate & rhythm and no murmurs and no lower extremity edema ABDOMEN:abdomen soft, non-tender and normal bowel sounds Musculoskeletal:no cyanosis of digits and no clubbing  PSYCH: alert & oriented x 3 with fluent speech NEURO: no focal motor/sensory deficits  LABORATORY DATA:  I have reviewed the data as listed Lab Results  Component Value Date   WBC 11.5 (H) 08/18/2022   HGB 10.2 (L) 08/18/2022   HCT 32.1 (L) 08/18/2022   MCV 82.3 08/18/2022   PLT 404 (H) 08/18/2022   Recent Labs    03/14/22 1430 03/14/22 1715 07/11/22 0444 07/28/22 1023 07/31/22 0917 08/04/22 1041 08/18/22 0935  NA  --    < > 139 136 138 136 138  K  --    < > 4.0 2.4* 3.2* 3.7 2.8*  CL  --    < > 106 100 103 105 105  CO2  --    < > 27 24 26 24 25   GLUCOSE  --    < > 124* 199* 207* 165* 214*  BUN  --    < > 20 23 24* 27* 28*  CREATININE  --    < > 0.88 1.00 1.06* 1.15* 1.10*  CALCIUM  --    < > 8.6* 8.3* 8.2* 8.5* 8.0*  GFRNONAA  --    < > >60 56* 52* 48* 50*  PROT  --    < > 6.2* 6.7  --   --  6.9  ALBUMIN  --    < > 3.2* 3.3*  --   --  3.1*  AST  --    < > 31 28  --   --  37  ALT  --    < > 52* 49*  --   --  38  ALKPHOS  --    < > 235* 176*  --   --  220*  BILITOT  --    < > 0.7 0.4  --   --  0.5  BILIDIR 1.9*  --   --   --   --   --   --    < > = values in this interval not displayed.    RADIOGRAPHIC STUDIES: I have personally reviewed the radiological images as listed and agreed with the findings in the report. CT Abdomen Pelvis W Contrast  Result Date: 08/17/2022 CLINICAL DATA:  Follow-up of pancreatic cancer. * Tracking Code: BO * EXAM: CT ABDOMEN AND PELVIS WITH CONTRAST TECHNIQUE: Multidetector CT imaging of the abdomen and pelvis was performed using the standard protocol following bolus administration of intravenous contrast. RADIATION DOSE REDUCTION: This exam was performed according to the  departmental dose-optimization program which includes automated exposure control, adjustment of the mA and/or kV according to patient size and/or use of iterative reconstruction technique. CONTRAST:  25mL OMNIPAQUE IOHEXOL 300 MG/ML  SOLN COMPARISON:  04/17/2022 FINDINGS: Lower chest: Mild centrilobular emphysema. Normal heart size without pericardial or pleural effusion. Right coronary artery calcification. Hepatobiliary: Development of bilobar hypoattenuating liver lesions consistent with metastatic disease. Index right hepatic lobe lesion measures 2.4 x 2.5 cm in 28/2. Index segment 2 left hepatic lobe lesion measures 1.4 x 1.3 cm on 11/02. Pneumobilia indicative of common duct stent patency. No calcified gallstone or specific evidence of acute cholecystitis. Pancreas: Increase in pancreatic duct dilatation and atrophy throughout the body. For example, the duct measures 8 mm within the neck on 23/2 versus 5 mm on the prior. Followed to the level of the pancreatic head primary which is significantly enlarged at 3.2 x 2.9 cm on 28/2. Compare 2.1 x 1.3 cm. Common duct stent terminates at the ampulla. Spleen: Normal in size, without focal abnormality. Adrenals/Urinary Tract: Normal right adrenal gland. Left adrenal thickening and nodularity including at up to 2.0 cm. Previously characterized as an adenoma on noncontrast CT. Right renal cysts of up to 1.3 cm. Left renal too small to characterize lesions are also most likely cysts . In the absence of clinically indicated signs/symptoms require(s) no independent follow-up. No hydronephrosis. Normal urinary bladder. Stomach/Bowel: Normal stomach, without wall thickening. Colonic stool burden suggests constipation. Normal small bowel. Vascular/Lymphatic: Aortic atherosclerosis. SMV narrowing by tumor on 28/2. No venous thrombosis. A node posterior to the left common iliac measures 7 mm on 34/2 versus 5 mm on the prior. Left periaortic node measures 11 mm on 21/2 and is  newly enlarged. Reproductive: Hysterectomy.  No adnexal mass. Other: Trace abdominal ascites including in the perisplenic and perihepatic spaces, new. No free intraperitoneal air. Musculoskeletal: Osteopenia.  Trace L4-5 anterolisthesis. IMPRESSION: 1. Significant enlargement of pancreatic head primary with increase in upstream atrophy and duct dilatation. 2. Development of hepatic and abdominal nodal metastasis. 3. New trace abdominopelvic fluid. 4. Incidental findings, including: Aortic atherosclerosis (ICD10-I70.0), coronary artery atherosclerosis and emphysema (ICD10-J43.9). Possible constipation. Electronically Signed   By: Abigail Miyamoto M.D.   On: 08/17/2022 10:52

## 2022-08-18 NOTE — Progress Notes (Signed)
Husband is caring for his wife and doing an excellent job. He is concerned with swelling of both feet, " Passing water frequently and increased amounts" and she has yellow stool. He would like to know what her potassium levels are.     She is in a wheel chair today due to weakness post treatment in radiology last week, husband states she had low potassium and passed out.

## 2022-08-18 NOTE — Patient Instructions (Signed)
Hypomagnesemia Hypomagnesemia is a condition in which the level of magnesium in the blood is too low. Magnesium is a mineral that is found in many foods. It is used in many different processes in the body. Hypomagnesemia can affect every organ in the body. In severe cases, it can cause life-threatening problems. What are the causes? This condition may be caused by: Not getting enough magnesium in your diet or not having enough healthy foods to eat (malnutrition). Problems with magnesium absorption in the intestines. Dehydration. Excessive use of alcohol. Vomiting. Severe or long-term (chronic) diarrhea. Some medicines, including medicines that make you urinate more often (diuretics). Certain diseases, such as kidney disease, diabetes, celiac disease, and overactive thyroid. What are the signs or symptoms? Symptoms of this condition include: Loss of appetite, nausea, and vomiting. Involuntary shaking or trembling of a body part (tremor). Muscle weakness or tingling in the arms and legs. Sudden tightening of muscles (muscle spasms). Confusion. Psychiatric issues, such as: Depression and irritability. Psychosis. A feeling of fluttering of the heart (palpitations). Seizures. These symptoms are more severe if magnesium levels drop suddenly. How is this diagnosed? This condition may be diagnosed based on: Your symptoms and medical history. A physical exam. Blood and urine tests. How is this treated? Treatment depends on the cause and the severity of the condition. It may be treated by: Taking a magnesium supplement. This can be taken in pill form. If the condition is severe, magnesium is usually given through an IV. Making changes to your diet. You may be directed to eat foods that have a lot of magnesium, such as green leafy vegetables, peas, beans, and nuts. Not drinking alcohol. If you are struggling not to drink, ask your health care provider for help. Follow these instructions at  home: Eating and drinking     Make sure that your diet includes foods with magnesium. Foods that have a lot of magnesium in them include: Green leafy vegetables, such as spinach and broccoli. Beans and peas. Nuts and seeds, such as almonds and sunflower seeds. Whole grains, such as whole grain bread and fortified cereals. Drink fluids that contain salts and minerals (electrolytes), such as sports drinks, when you are active. Do not drink alcohol. General instructions Take over-the-counter and prescription medicines only as told by your health care provider. Take magnesium supplements as directed if your health care provider tells you to take them. Have your magnesium levels monitored as told by your health care provider. Keep all follow-up visits. This is important. Contact a health care provider if: You get worse instead of better. Your symptoms return. Get help right away if: You develop severe muscle weakness. You have trouble breathing. You feel that your heart is racing. These symptoms may represent a serious problem that is an emergency. Do not wait to see if the symptoms will go away. Get medical help right away. Call your local emergency services (911 in the [place] Do not drive yourself to the hospital. Summary Hypomagnesemia is a condition in which the level of magnesium in the blood is too low. Hypomagnesemia can affect every organ in the body. Treatment may include eating more foods that contain magnesium, taking magnesium supplements, and not drinking alcohol. Have your magnesium levels monitored as told by your health care provider. This information is not intended to replace advice given to you by your health care provider. Make sure you discuss any questions you have with your health care provider. Document Revised: 10/12/2020 Document Reviewed: 10/12/2020 Elsevier Patient Education    2023 [employer] Inc.  

## 2022-08-21 ENCOUNTER — Inpatient Hospital Stay (HOSPITAL_BASED_OUTPATIENT_CLINIC_OR_DEPARTMENT_OTHER): Payer: PPO | Admitting: Internal Medicine

## 2022-08-21 ENCOUNTER — Other Ambulatory Visit (HOSPITAL_COMMUNITY): Payer: Self-pay

## 2022-08-21 ENCOUNTER — Encounter: Payer: Self-pay | Admitting: Internal Medicine

## 2022-08-21 ENCOUNTER — Inpatient Hospital Stay (HOSPITAL_BASED_OUTPATIENT_CLINIC_OR_DEPARTMENT_OTHER): Payer: PPO | Admitting: Hospice and Palliative Medicine

## 2022-08-21 DIAGNOSIS — K903 Pancreatic steatorrhea: Secondary | ICD-10-CM | POA: Diagnosis not present

## 2022-08-21 DIAGNOSIS — C250 Malignant neoplasm of head of pancreas: Secondary | ICD-10-CM | POA: Diagnosis not present

## 2022-08-21 DIAGNOSIS — E876 Hypokalemia: Secondary | ICD-10-CM

## 2022-08-21 DIAGNOSIS — D649 Anemia, unspecified: Secondary | ICD-10-CM

## 2022-08-21 DIAGNOSIS — Z7189 Other specified counseling: Secondary | ICD-10-CM | POA: Diagnosis not present

## 2022-08-21 DIAGNOSIS — G3184 Mild cognitive impairment, so stated: Secondary | ICD-10-CM

## 2022-08-21 MED ORDER — OXYCODONE HCL 5 MG PO TABS
5.0000 mg | ORAL_TABLET | Freq: Four times a day (QID) | ORAL | 0 refills | Status: DC | PRN
  Filled 2022-08-21: qty 30, 7d supply, fill #0

## 2022-08-21 MED ORDER — TRAZODONE HCL 50 MG PO TABS
25.0000 mg | ORAL_TABLET | Freq: Every evening | ORAL | 0 refills | Status: DC | PRN
  Filled 2022-08-21: qty 30, 30d supply, fill #0

## 2022-08-21 NOTE — Progress Notes (Signed)
Patient is sleeping more in the day and not sleeping at night and husband states she doesn't have anything for pain. Patient has occasional pain.

## 2022-08-21 NOTE — Addendum Note (Signed)
Addended by: Irean Hong on: 08/21/2022 04:08 PM   Modules accepted: Orders

## 2022-08-21 NOTE — Progress Notes (Signed)
[facility] CONSULT NOTE  I connected with Shirley Nichols on 08/21/22 at  3:30 PM EDT by my chart video and verified that I am speaking with the correct person using two identifiers.   I discussed the limitations, risks, security and privacy concerns of performing an evaluation and management service by telemedicine and the availability of in-person appointments. I also discussed with the patient that there may be a patient responsible charge related to this service. The patient expressed understanding and agreed to proceed.   Other persons participating in the visit and their role in the encounter: 2 daughters and husband  Patient's location: Home Provider's location: Clinic  Chief Complaint: Follow-up on the treatment plan  Patient Care Team: Baxter Hire, MD as PCP - General (Internal Medicine) Clent Jacks, RN as Oncology Nurse Navigator Jane Canary, MD as Consulting Physician (Oncology)   CANCER STAGING   Cancer Staging  Pancreatic cancer [facility]) Staging form: Exocrine Pancreas, AJCC 8th Edition - Clinical stage from 03/09/2022: Stage IV (cT2, cN0, cM1) - Signed by Jane Canary, MD on 08/18/2022 Stage prefix: Initial diagnosis Total positive nodes: 0   ASSESSMENT & PLAN:  Shirley Nichols 82 y.o. female with pmh of hypertension and hyperlipidemia was seen for localized pancreatic cancer.  # Metastatic pancreatic cancer - MRCP on 03/03/2022 showed a 3.8 cm pancreatic head mass and biliary dilatation.  She had ERCP with dilatation of upper and mid third main bile duct with obstructing mass and 1 metal stent was placed.  Brushings were positive for adenocarcinoma. Patient is not a surgical candidate due to poor functional status.  Completed radiation 54 Gray in 30 fractions on 07/24/2022 had difficulty tolerating the last 2 weeks.  CA 19-9 has dramatically increased to around 8000.  CT abdomen pelvis showed new liver lesions, increase in the pancreatic  mass from 2.1 x 1.3 cm to 3.2 x 2.9 cm, increase in the size of lymph node posterior to the left common iliac from 5 mm to 7 mm and new left periaortic node consistent with metastatic disease.  I have discussed with the patient and the husband last week about progression and now having a stage IV pancreatic cancer.  I discussed about treatment with single agent low-dose gemcitabine due to her poor functional status versus hospice.   I had extensive discussion with the family again today about the options. They had discussion over the weekend with her 2 daughters and they have decided to proceed with comfort measures and hospice.  She had telephone visit with Josh today.  I spoke with him and will make a referral to hospice today.  Can discontinue cholesterol medication and aspirin.  Palliative care had sent prescription for oxycodone 5 mg every 6 as needed and trazodone as needed.  She is not in pain right now but may need in the future.  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  Improved -Continue with Creon 36,000 units with meals 3 times daily.    # Hypokalemia -Continue with K-Dur 20 milliequivalents daily  # Hypomagnesemia -Continue with magnesium supplements  #Anemia -iron panel consistent with anemia of chronic disease -Hemoglobin stable.  # Memory issues -Follows with neurology Dr. Manuella Ghazi   No orders of the defined types were placed in this encounter.  RTC on Monday via video visit  The total time spent in the appointment was 35 minutes encounter with patients including review of chart and various tests results, discussions about plan of care and  coordination of care plan   All questions were answered. The patient knows to call the clinic with any problems, questions or concerns. No barriers to learning was detected.  Jane Canary, MD 3/25/20243:39 PM   HISTORY OF PRESENTING ILLNESS:  Shirley Nichols 82 y.o. female with pmh of hypertension and  hyperlipidemia was seen for localized pancreatic cancer.  Patient was sent to ED by PCP in October 2023 for elevated liver enzymes and concern for biliary obstruction. She had MRCP on 03/03/2022 which showed a 3.8 cm pancreatic head mass and biliary dilatation.  She had ERCP with dilatation of upper and mid third main bile duct with obstructing mass and 1 metal stent was placed.  Brushings were positive for adenocarcinoma.  Was complicated by post ERCP pancreatitis.  Her hospital course was prolonged by acute respiratory failure which required intubation.  There was suspicion for seizure and was started on Keppra and Dilantin by neurology.  MRI brain and LP was negative for malignancy.  CT chest was negative for metastatic disease.  She also had acute renal failure which improved with fluids.  Prior to hospitalization in October, patient was functional and very independent with her ADLs.  She was driving and doing things.  Interval history I had my chart video visit with the patient, her husband, and 2 daughters. She has been eating well and gained 7 pounds.  Energy levels are low.  Has to go to 2 urination more frequent after mag infusion.  It is better now.  Diarrhea has resolved after adding Creon.  I have reviewed her chart and materials related to her cancer extensively and collaborated history with the patient. Summary of oncologic history is as follows: Oncology History  Pancreatic cancer ([place])  03/03/2022 Imaging   CT abdomen and pelvis  Hazy stranding about the pancreatic head suggestive of pancreatitis. This is incompletely evaluated without IV contrast. Dilated gallbladder and common bile duct. MRI/MRCP is recommended to evaluate for choledocholithiasis or obstructing mass.   03/03/2022 Imaging   MRI  Image degradation by motion artifact noted.   3.4 cm masslike density in the pancreatic head, highly suspicious for pancreatic carcinoma.    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 No evidence of choledocholithiasis.   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   03/05/2022 Pathology Results   FINAL MICROSCOPIC DIAGNOSIS:  - Malignant cells consistent with adenocarcinoma    03/05/2022 Procedure   ERCP - The major papilla appeared normal. - The upper third of the main bile duct and middle third of the main bile duct were dilated, with a mass causing an obstruction. - A biliary sphincterotomy was performed. - One covered metal stent was placed into the common bile duct. - Cells for cytology obtained in the lower third of the main duct.   03/09/2022 Cancer Staging   Staging form: Exocrine Pancreas, AJCC 8th Edition - Clinical stage from 03/09/2022: Stage IV (cT2, cN0, cM1) - Signed by Jane Canary, MD on 08/18/2022 Stage prefix: Initial diagnosis Total positive nodes: 0    Hospital Admission   Admit date: 04/17/2022 to 04/22/2022 Admission diagnosis: Generalized weakness and fatigue, poor p.o. intake. Additional comments: Repeat CT abdomen pelvis done for worsening transaminitis showed stable pancreatic mass and metallic stents in place.  No concerns for infection.  Venous Doppler showed right Baker's cyst.  Rehab on discharge declined by family.     MEDICAL HISTORY:  Past Medical History:  Diagnosis Date   Cancer ([place])    skin cancer   Hyperlipidemia    Hypertension     SURGICAL HISTORY: Past Surgical History:  Procedure Laterality Date   ABDOMINAL HYSTERECTOMY     BILIARY BRUSHING  03/05/2022   Procedure: BILIARY BRUSHING;  Surgeon: Carol Ada, MD;  Location[facility]COPY;  Service: Gastroenterology;;   BILIARY STENT PLACEMENT  03/05/2022   Procedure: BILIARY STENT PLACEMENT;  Surgeon: Carol Ada, MD;  Location: [facility] ENDOSCOPY;  Service: Gastroenterology;;   COLONOSCOPY     02/21/2002 adenomatous polyp, 03/23/2011   COLONOSCOPY W/  POLYPECTOMY  02/2000   TA polyp removed from ascending colon.  performed through [facility] (? affiliate in [place])   COLONOSCOPY WITH PROPOFOL N/A 11/03/2016   Procedure: COLONOSCOPY WITH PROPOFOL;  Surgeon: Manya Silvas, MD;  Location: [facility] ENDOSCOPY;  Service: Endoscopy;  Laterality: N/A;   ERCP N/A 03/05/2022   Procedure: ENDOSCOPIC RETROGRADE CHOLANGIOPANCREATOGRAPHY (ERCP);  Surgeon: Carol Ada, MD;  Location: [place];  Service: Gastroenterology;  Laterality: N/A;   HEMORROIDECTOMY     TONSILLECTOMY      SOCIAL HISTORY: Social History   Socioeconomic History   Marital status: Married    Spouse name: Not on file   Number of children: Not on file   Years of education: Not on file   Highest education level: Not on file  Occupational History   Not on file  Tobacco Use   Smoking status: Former   Smokeless tobacco: Never  Substance and Sexual Activity   Alcohol use: No   Drug use: No   Sexual activity: Yes  Other Topics Concern   Not on file  Social History Narrative   Not on file   Social Determinants of Health   Financial Resource Strain: Not on file  Food Insecurity: No Food Insecurity (07/10/2022)   Hunger Vital Sign    Worried About Running Out of Food in the Last Year: Never true    Ran Out of Food in the Last Year: Never true  Transportation Needs: No Transportation Needs (07/10/2022)   PRAPARE - Hydrologist (Medical): No    Lack of Transportation (Non-Medical): No  Physical Activity: Not on file  Stress: Not on file  Social Connections: Not on file  Intimate Partner Violence: Not At Risk (07/10/2022)   Humiliation, Afraid, Rape, and Kick questionnaire    Fear of Current or Ex-Partner: No    Emotionally Abused: No    Physically Abused: No    Sexually Abused: No    FAMILY HISTORY: Family History  Problem Relation Age of Onset   Seizures Sister     ALLERGIES:  has No Known Allergies.  MEDICATIONS:  Current  Outpatient Medications  Medication Sig Dispense Refill   acetaminophen (TYLENOL) 325 MG tablet Take 2 tablets (650 mg total) by mouth every 6 (six) hours as needed for mild pain, fever or headache.     aspirin EC 81 MG tablet Take 81 mg by mouth daily.     donepezil (ARICEPT) 5 MG tablet Take 1 tablet (5 mg total) by mouth every morning. 30 tablet 5   ezetimibe (ZETIA) 10 MG tablet Take 10 mg by mouth daily.     ezetimibe (ZETIA) 10 MG tablet Take 1 tablet (10 mg total) by mouth every evening. 90 tablet 3   lipase/protease/amylase (CREON) 36000 UNITS CPEP capsule Take 1 capsule (36,000 Units total) by mouth 3 (three) times daily with meals. [place]  capsule 3   megestrol (MEGACE) 40 MG/ML suspension Take 400 mg by mouth 2 (two) times daily.     oxyCODONE (OXY IR/ROXICODONE) 5 MG immediate release tablet Take 1 tablet (5 mg total) by mouth every 6 (six) hours as needed for severe pain. 30 tablet 0   potassium chloride SA (KLOR-CON M) 20 MEQ tablet Take 1 tablet (20 mEq total) by mouth daily. Take 1 pill two times a day for 2 days then one pill daily. 30 tablet 1   SYMBICORT 160-4.5 MCG/ACT inhaler Inhale 2 puffs into the lungs as needed (for wheezing or shortness of breath). 10.2 g 4   traZODone (DESYREL) 50 MG tablet Take 0.5-1 tablets (25-50 mg total) by mouth at bedtime as needed for sleep. 30 tablet 0   No current facility-administered medications for this visit.    REVIEW OF SYSTEMS:   Pertinent information mentioned in HPI All other systems were reviewed with the patient and are negative.  PHYSICAL EXAMINATION: ECOG PERFORMANCE STATUS: 3 - Symptomatic, >50% confined to bed  There were no vitals filed for this visit.     There were no vitals filed for this visit.      GENERAL:alert, no distress and comfortable SKIN: skin color, texture, turgor are normal, no rashes or significant lesions EYES: normal, conjunctiva are pink and non-injected, sclera clear OROPHARYNX:no exudate, no  erythema and lips, buccal mucosa, and tongue normal  NECK: supple, thyroid normal size, non-tender, without nodularity LYMPH:  no palpable lymphadenopathy in the cervical, axillary or inguinal LUNGS: clear to auscultation and percussion with normal breathing effort HEART: regular rate & rhythm and no murmurs and no lower extremity edema ABDOMEN:abdomen soft, non-tender and normal bowel sounds Musculoskeletal:no cyanosis of digits and no clubbing  PSYCH: alert & oriented x 3 with fluent speech NEURO: no focal motor/sensory deficits  LABORATORY DATA:  I have reviewed the data as listed Lab Results  Component Value Date   WBC 11.5 (H) 08/18/2022   HGB 10.2 (L) 08/18/2022   HCT 32.1 (L) 08/18/2022   MCV 82.3 08/18/2022   PLT 404 (H) 08/18/2022   Recent Labs    03/14/22 1430 03/14/22 1715 07/11/22 0444 07/28/22 1023 07/31/22 0917 08/04/22 1041 08/18/22 0935  NA  --    < > 139 136 138 136 138  K  --    < > 4.0 2.4* 3.2* 3.7 2.8*  CL  --    < > 106 100 103 105 105  CO2  --    < > 27 24 26 24 25   GLUCOSE  --    < > 124* 199* 207* 165* 214*  BUN  --    < > 20 23 24* 27* 28*  CREATININE  --    < > 0.88 1.00 1.06* 1.15* 1.10*  CALCIUM  --    < > 8.6* 8.3* 8.2* 8.5* 8.0*  GFRNONAA  --    < > >60 56* 52* 48* 50*  PROT  --    < > 6.2* 6.7  --   --  6.9  ALBUMIN  --    < > 3.2* 3.3*  --   --  3.1*  AST  --    < > 31 28  --   --  37  ALT  --    < > 52* 49*  --   --  38  ALKPHOS  --    < > 235* 176*  --   --  220*  BILITOT  --    < >  0.7 0.4  --   --  0.5  BILIDIR 1.9*  --   --   --   --   --   --    < > = values in this interval not displayed.    RADIOGRAPHIC STUDIES: I have personally reviewed the radiological images as listed and agreed with the findings in the report. CT Abdomen Pelvis W Contrast  Result Date: 08/17/2022 CLINICAL DATA:  Follow-up of pancreatic cancer. * Tracking Code: BO * EXAM: CT ABDOMEN AND PELVIS WITH CONTRAST TECHNIQUE: Multidetector CT imaging of the  abdomen and pelvis was performed using the standard protocol following bolus administration of intravenous contrast. RADIATION DOSE REDUCTION: This exam was performed according to the departmental dose-optimization program which includes automated exposure control, adjustment of the mA and/or kV according to patient size and/or use of iterative reconstruction technique. CONTRAST:  45mL OMNIPAQUE IOHEXOL 300 MG/ML  SOLN COMPARISON:  04/17/2022 FINDINGS: Lower chest: Mild centrilobular emphysema. Normal heart size without pericardial or pleural effusion. Right coronary artery calcification. Hepatobiliary: Development of bilobar hypoattenuating liver lesions consistent with metastatic disease. Index right hepatic lobe lesion measures 2.4 x 2.5 cm in 28/2. Index segment 2 left hepatic lobe lesion measures 1.4 x 1.3 cm on 11/02. Pneumobilia indicative of common duct stent patency. No calcified gallstone or specific evidence of acute cholecystitis. Pancreas: Increase in pancreatic duct dilatation and atrophy throughout the body. For example, the duct measures 8 mm within the neck on 23/2 versus 5 mm on the prior. Followed to the level of the pancreatic head primary which is significantly enlarged at 3.2 x 2.9 cm on 28/2. Compare 2.1 x 1.3 cm. Common duct stent terminates at the ampulla. Spleen: Normal in size, without focal abnormality. Adrenals/Urinary Tract: Normal right adrenal gland. Left adrenal thickening and nodularity including at up to 2.0 cm. Previously characterized as an adenoma on noncontrast CT. Right renal cysts of up to 1.3 cm. Left renal too small to characterize lesions are also most likely cysts . In the absence of clinically indicated signs/symptoms require(s) no independent follow-up. No hydronephrosis. Normal urinary bladder. Stomach/Bowel: Normal stomach, without wall thickening. Colonic stool burden suggests constipation. Normal small bowel. Vascular/Lymphatic: Aortic atherosclerosis. SMV narrowing  by tumor on 28/2. No venous thrombosis. A node posterior to the left common iliac measures 7 mm on 34/2 versus 5 mm on the prior. Left periaortic node measures 11 mm on 21/2 and is newly enlarged. Reproductive: Hysterectomy.  No adnexal mass. Other: Trace abdominal ascites including in the perisplenic and perihepatic spaces, new. No free intraperitoneal air. Musculoskeletal: Osteopenia.  Trace L4-5 anterolisthesis. IMPRESSION: 1. Significant enlargement of pancreatic head primary with increase in upstream atrophy and duct dilatation. 2. Development of hepatic and abdominal nodal metastasis. 3. New trace abdominopelvic fluid. 4. Incidental findings, including: Aortic atherosclerosis (ICD10-I70.0), coronary artery atherosclerosis and emphysema (ICD10-J43.9). Possible constipation. Electronically Signed   By: Abigail Miyamoto M.D.   On: 08/17/2022 10:52

## 2022-08-21 NOTE — Progress Notes (Signed)
Virtual Visit via Telephone Note  I connected with Shirley Nichols on 08/21/22 at  1:00 PM EDT by telephone and verified that I am speaking with the correct person using two identifiers.  Location: Patient: Home Provider: Clinic   I discussed the limitations, risks, security and privacy concerns of performing an evaluation and management service by telephone and the availability of in person appointments. I also discussed with the patient that there may be a patient responsible charge related to this service. The patient expressed understanding and agreed to proceed.   History of Present Illness: Shirley Nichols is a 82 y.o. female with multiple medical problems including including pancreatic cancer.  Patient is status post RT.  CT abdomen and pelvis shows new hepatic metastasis and increased size and pancreatic mass.  Palliative care was consulted to address goals.   Observations/Objective: Spoke with patient and husband by phone.  They are aware that CT suggests cancer progression.  Patient has had declining performance status and patient and husband do not think that they would be interested in pursuing any additional treatments at this time.  Instead, their goals are aligned with staying home and focusing on comfort.  We discussed hospice involvement in detail.  Husband has started looking into local hospice organizations and asked about [facility].  However, patient and family have a scheduled virtual visit this afternoon with Dr. Darrall Dears and husband wants to wait until that meeting to make a final decision.  Symptomatically, she has discomfort but denies significant pain.  Husband is interested in obtaining pain medication to have on hand in case she starts hurting at night on the weekend when access to pain medications may prove difficult.  Patient has taken oxycodone in the past with good effect.  Will send a new prescription.  Additionally, husband states the patient is not resting well  and is up and down throughout the night.  This may be related to pain but husband would like to try something to help facilitate her sleep.  Will start her on low-dose trazodone.  CODE STATUS discussed.  Patient confirmed DNR/DNI.  Assessment and Plan: Stage IV pancreatic cancer -patient and husband are leaning towards comfort care/hospice but want to speak with Dr. Darrall Dears prior to making final decision.  DNR/DNI  Neoplasm related pain -start oxycodone 5 mg every 6 hours as needed #30  Insomnia -start trazodone 25 to 50 mg nightly as needed  Follow Up Instructions: As needed   I discussed the assessment and treatment plan with the patient. The patient was provided an opportunity to ask questions and all were answered. The patient agreed with the plan and demonstrated an understanding of the instructions.   The patient was advised to call back or seek an in-person evaluation if the symptoms worsen or if the condition fails to improve as anticipated.  I provided 15 minutes of non-face-to-face time during this encounter.   Irean Hong, NP

## 2022-08-22 ENCOUNTER — Other Ambulatory Visit: Payer: Self-pay

## 2022-08-23 ENCOUNTER — Ambulatory Visit: Payer: PPO | Admitting: Radiation Oncology

## 2022-08-23 LAB — CANCER ANTIGEN 19-9: CA 19-9: 19228 U/mL — ABNORMAL HIGH (ref 0–35)

## 2022-09-07 ENCOUNTER — Telehealth: Payer: Self-pay | Admitting: Hospice and Palliative Medicine

## 2022-09-07 NOTE — Telephone Encounter (Signed)
Received a call from patient's hospice nurse, Mitzie.  Patient has had intermittent nausea and gas discomfort.  We discussed utilizing ondansetron and patient can trial simethicone as needed.  Additionally, patient has had some difficulty sleeping unrelieved with trazodone.  Husband apparently felt that this insomnia could be related to her gas pain.  Okay to increase dose of trazodone to 50 to 100 mg as needed.

## 2022-09-09 ENCOUNTER — Other Ambulatory Visit: Payer: Self-pay | Admitting: Internal Medicine

## 2022-09-14 ENCOUNTER — Other Ambulatory Visit: Payer: Self-pay | Admitting: *Deleted

## 2022-09-14 MED ORDER — TRAZODONE HCL 50 MG PO TABS: 25.0000 mg | ORAL_TABLET | Freq: Every evening | ORAL | 0 refills | Status: DC | PRN

## 2022-09-14 MED ORDER — OMEPRAZOLE 20 MG PO CPDR: 20.0000 mg | DELAYED_RELEASE_CAPSULE | Freq: Every day | ORAL | 2 refills | Status: DC

## 2022-09-19 ENCOUNTER — Telehealth: Payer: Self-pay | Admitting: *Deleted

## 2022-09-19 MED ORDER — OMEPRAZOLE 20 MG PO CPDR: 20.0000 mg | DELAYED_RELEASE_CAPSULE | Freq: Every day | ORAL | 0 refills | Status: DC

## 2022-09-19 MED ORDER — TRAZODONE HCL 50 MG PO TABS: 25.0000 mg | ORAL_TABLET | Freq: Every evening | ORAL | 0 refills | Status: DC | PRN

## 2022-09-19 NOTE — Telephone Encounter (Signed)
Pharmacy sent fax request for a 90 days supple of omeprazole and trazodone. I see you RF scripts for #30 on 4/18. Pt is on hospice services.- is 90 days supply appropriate?

## 2022-09-22 ENCOUNTER — Telehealth: Payer: Self-pay

## 2022-09-22 NOTE — Telephone Encounter (Signed)
Faxed form pre requested

## 2022-09-23 IMAGING — MG MM DIGITAL SCREENING BILAT W/ TOMO AND CAD
8 series · 8 of 24 positions shown · non-contrast
Comparison: Previous exam(s).

CLINICAL DATA: Screening.

EXAM:
DIGITAL SCREENING BILATERAL MAMMOGRAM WITH TOMOSYNTHESIS AND CAD
TECHNIQUE: Bilateral screening digital craniocaudal and mediolateral oblique
mammograms were obtained. Bilateral screening digital breast
tomosynthesis was performed. The images were evaluated with
computer-aided detection.

[R MLO synth-2D]
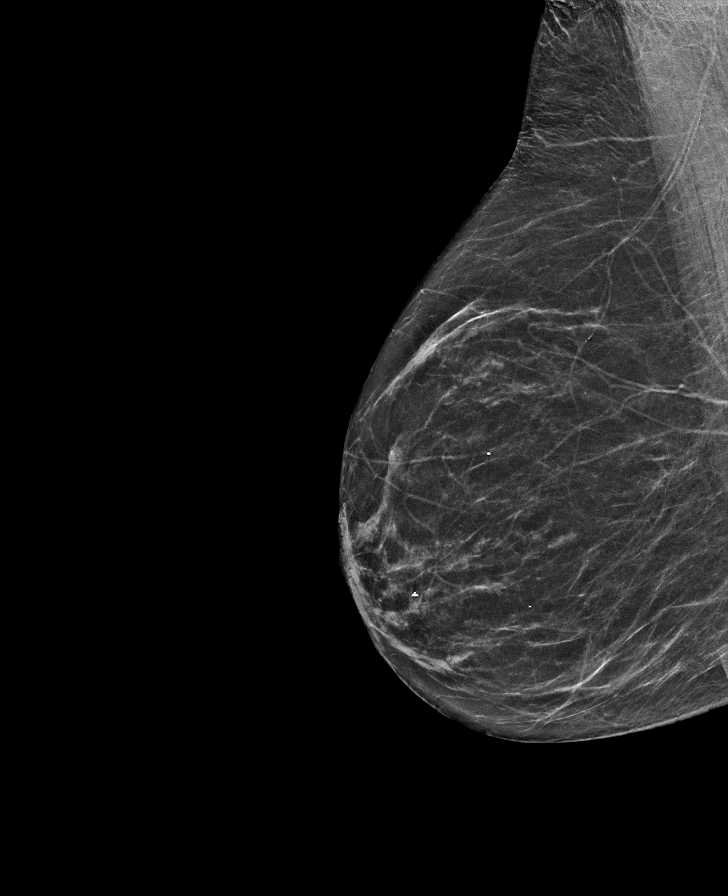

[R CC synth-2D]
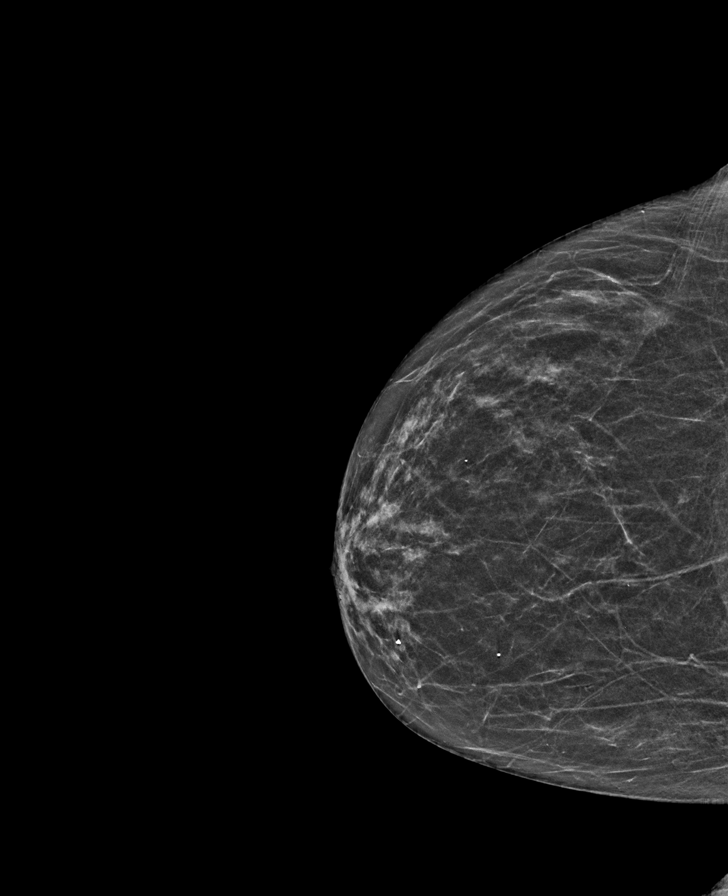

[L CC synth-2D]
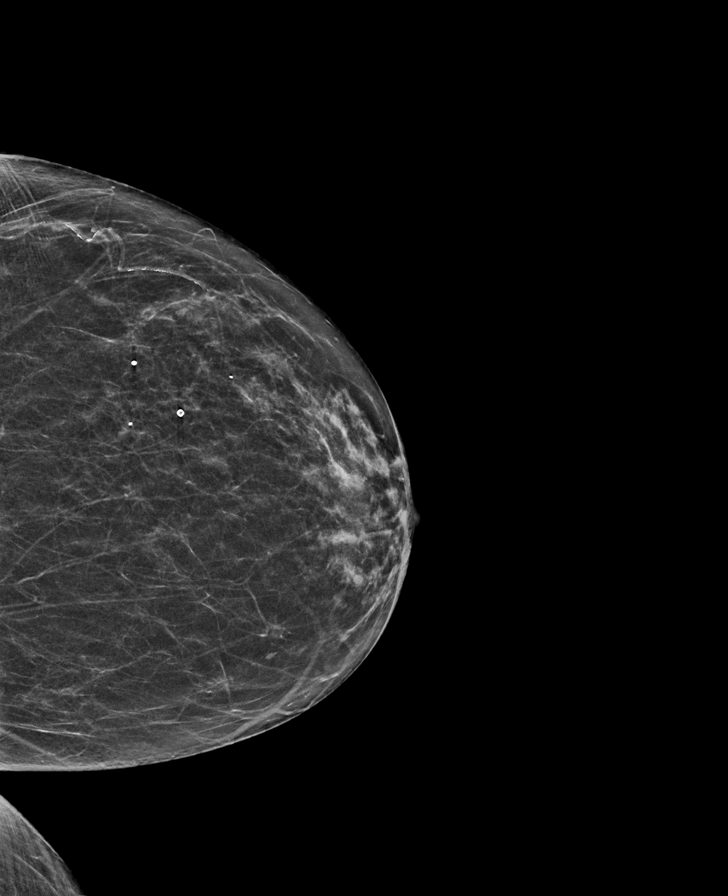

[L MLO synth-2D]
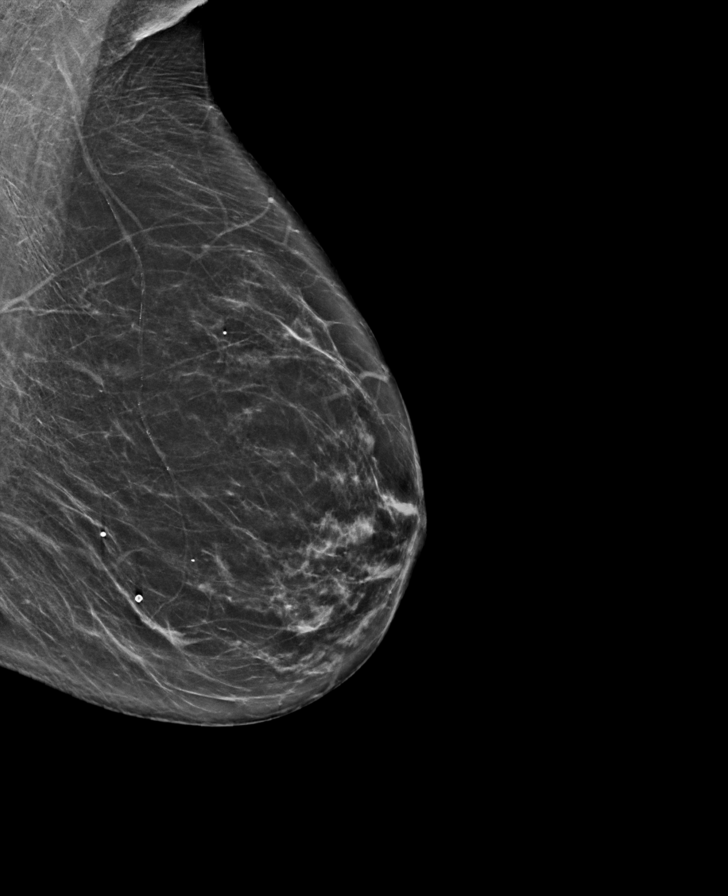

[L CC tomo · tomo slice 25/50.0]
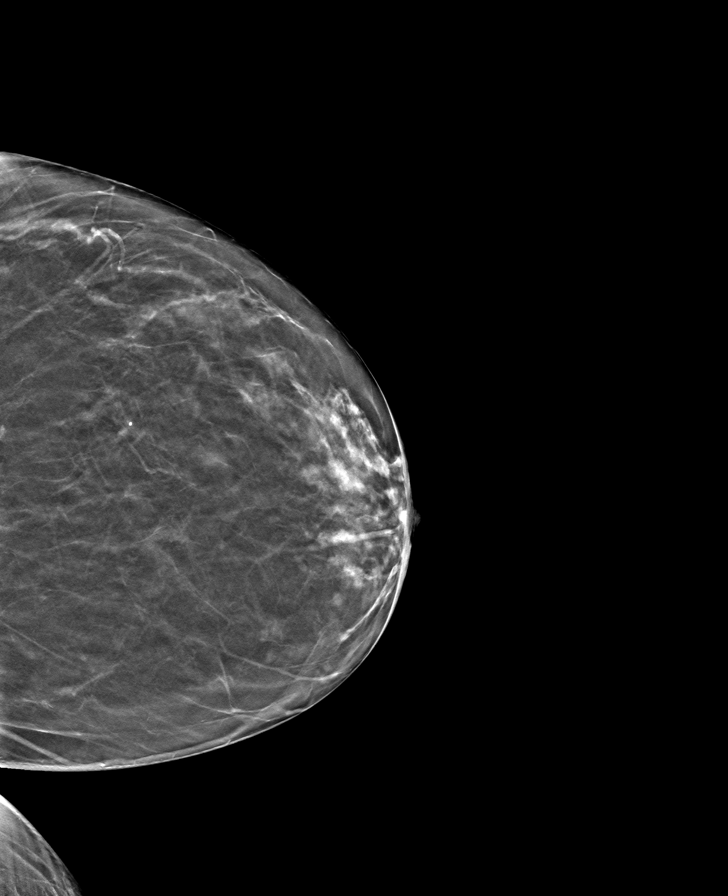

[R MLO tomo · tomo slice 28/55.0]
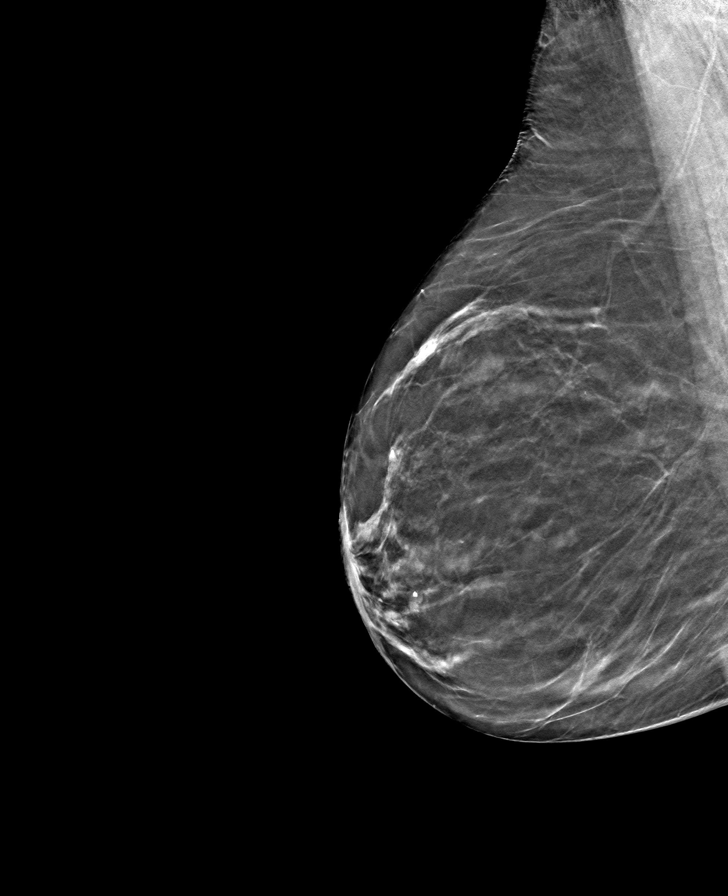

[R CC tomo · tomo slice 25/49.0]
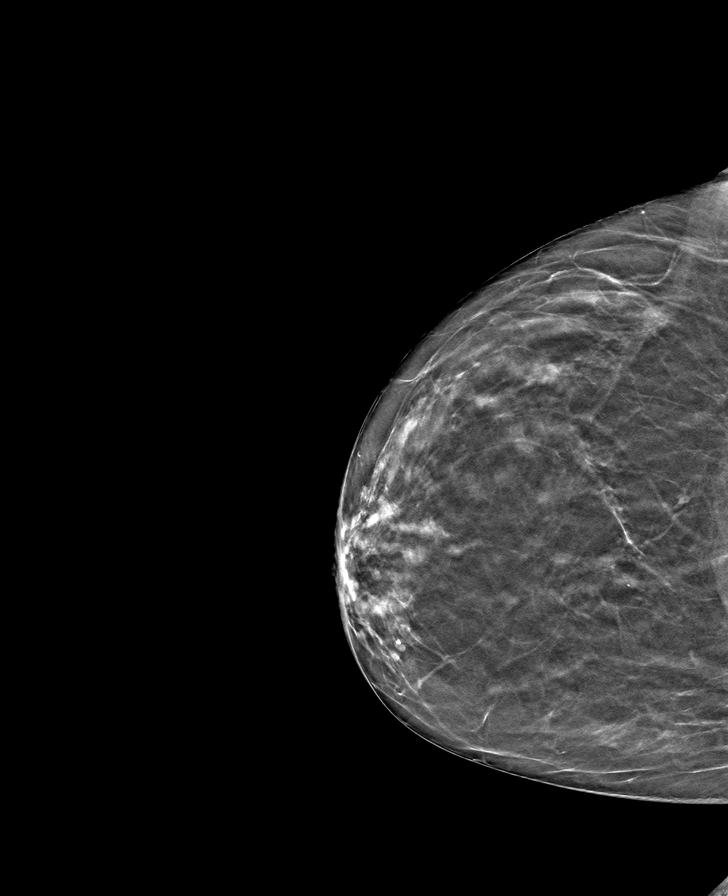

[L MLO tomo · tomo slice 35/70.0]
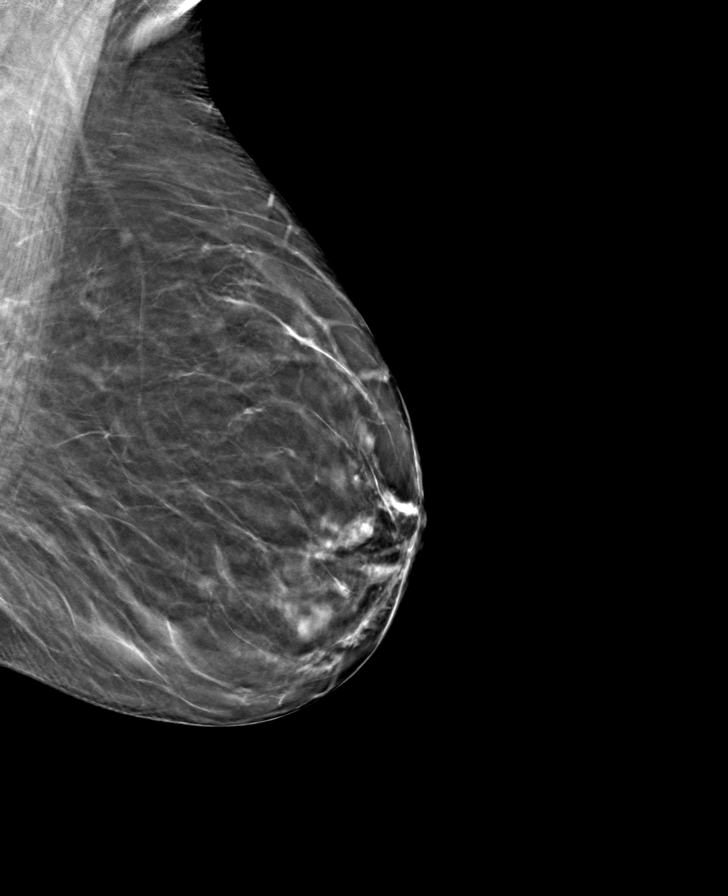

[8 of 24 positions shown; findings below may reference images not displayed]

ACR Breast Density Category b: There are scattered areas of
fibroglandular density.
FINDINGS: There are no findings suspicious for malignancy.
IMPRESSION: No mammographic evidence of malignancy. A result letter of this
screening mammogram will be mailed directly to the patient.

RECOMMENDATION:
Screening mammogram in one year. (Code:51-O-LD2)

BI-RADS CATEGORY  1: Negative.

## 2022-09-27 DEATH — deceased
